# Patient Record
Sex: Female | Born: 1950
Health system: Southern US, Community
[De-identification: ages and names within clinical notes are randomized; demographics above are authoritative.]

## PROBLEM LIST (undated history)

## (undated) DIAGNOSIS — R42 Dizziness and giddiness: Secondary | ICD-10-CM

## (undated) DIAGNOSIS — G629 Polyneuropathy, unspecified: Secondary | ICD-10-CM

## (undated) DIAGNOSIS — C50919 Malignant neoplasm of unspecified site of unspecified female breast: Secondary | ICD-10-CM

## (undated) DIAGNOSIS — Z9889 Other specified postprocedural states: Secondary | ICD-10-CM

## (undated) DIAGNOSIS — R112 Nausea with vomiting, unspecified: Secondary | ICD-10-CM

## (undated) DIAGNOSIS — D472 Monoclonal gammopathy: Secondary | ICD-10-CM

## (undated) DIAGNOSIS — G4730 Sleep apnea, unspecified: Secondary | ICD-10-CM

## (undated) DIAGNOSIS — C9000 Multiple myeloma not having achieved remission: Secondary | ICD-10-CM

## (undated) DIAGNOSIS — R609 Edema, unspecified: Secondary | ICD-10-CM

## (undated) DIAGNOSIS — J45909 Unspecified asthma, uncomplicated: Secondary | ICD-10-CM

## (undated) DIAGNOSIS — M545 Low back pain, unspecified: Secondary | ICD-10-CM

## (undated) DIAGNOSIS — E7800 Pure hypercholesterolemia, unspecified: Secondary | ICD-10-CM

## (undated) DIAGNOSIS — F32A Depression, unspecified: Secondary | ICD-10-CM

## (undated) DIAGNOSIS — G63 Polyneuropathy in diseases classified elsewhere: Secondary | ICD-10-CM

## (undated) DIAGNOSIS — G4700 Insomnia, unspecified: Secondary | ICD-10-CM

## (undated) DIAGNOSIS — T7840XA Allergy, unspecified, initial encounter: Secondary | ICD-10-CM

## (undated) DIAGNOSIS — E119 Type 2 diabetes mellitus without complications: Secondary | ICD-10-CM

## (undated) DIAGNOSIS — F419 Anxiety disorder, unspecified: Secondary | ICD-10-CM

## (undated) DIAGNOSIS — G43909 Migraine, unspecified, not intractable, without status migrainosus: Secondary | ICD-10-CM

## (undated) DIAGNOSIS — I10 Essential (primary) hypertension: Secondary | ICD-10-CM

## (undated) DIAGNOSIS — I700 Atherosclerosis of aorta: Secondary | ICD-10-CM

## (undated) DIAGNOSIS — G8929 Other chronic pain: Secondary | ICD-10-CM

## (undated) HISTORY — DX: Essential (primary) hypertension: I10

## (undated) HISTORY — DX: Edema, unspecified: R60.9

## (undated) HISTORY — DX: Insomnia, unspecified: G47.00

## (undated) HISTORY — DX: Allergy, unspecified, initial encounter: T78.40XA

## (undated) HISTORY — DX: Atherosclerosis of aorta: I70.0

## (undated) HISTORY — PX: NASAL SINUS SURGERY: SHX719

## (undated) HISTORY — DX: Polyneuropathy in diseases classified elsewhere: G63

## (undated) HISTORY — DX: Pure hypercholesterolemia, unspecified: E78.00

## (undated) HISTORY — DX: Monoclonal gammopathy: D47.2

## (undated) HISTORY — DX: Polyneuropathy, unspecified: G62.9

## (undated) HISTORY — DX: Other chronic pain: G89.29

## (undated) HISTORY — DX: Migraine, unspecified, not intractable, without status migrainosus: G43.909

## (undated) HISTORY — PX: OTHER SURGICAL HISTORY: SHX169

## (undated) HISTORY — DX: Low back pain, unspecified: M54.50

## (undated) HISTORY — PX: TONSILLECTOMY: SUR1361

## (undated) HISTORY — DX: Multiple myeloma not having achieved remission: C90.00

## (undated) HISTORY — PX: NECK SURGERY: SHX720

## (undated) HISTORY — DX: Type 2 diabetes mellitus without complications: E11.9

## (undated) HISTORY — PX: BACK SURGERY: SHX140

---

## 1999-11-26 ENCOUNTER — Other Ambulatory Visit: Admission: RE | Admit: 1999-11-26 | Discharge: 1999-11-26 | Payer: Self-pay | Admitting: Obstetrics and Gynecology

## 2001-04-18 ENCOUNTER — Ambulatory Visit (HOSPITAL_COMMUNITY): Admission: RE | Admit: 2001-04-18 | Discharge: 2001-04-18 | Payer: Self-pay | Admitting: Family Medicine

## 2001-04-18 ENCOUNTER — Encounter: Payer: Self-pay | Admitting: Family Medicine

## 2002-02-18 ENCOUNTER — Ambulatory Visit (HOSPITAL_COMMUNITY): Admission: RE | Admit: 2002-02-18 | Discharge: 2002-02-18 | Payer: Self-pay | Admitting: Family Medicine

## 2002-02-18 ENCOUNTER — Encounter: Payer: Self-pay | Admitting: Family Medicine

## 2002-08-01 ENCOUNTER — Encounter (INDEPENDENT_AMBULATORY_CARE_PROVIDER_SITE_OTHER): Payer: Self-pay | Admitting: Specialist

## 2002-08-01 ENCOUNTER — Ambulatory Visit (HOSPITAL_COMMUNITY): Admission: RE | Admit: 2002-08-01 | Discharge: 2002-08-01 | Payer: Self-pay | Admitting: Gastroenterology

## 2003-05-30 ENCOUNTER — Encounter: Payer: Self-pay | Admitting: *Deleted

## 2003-05-30 ENCOUNTER — Ambulatory Visit (HOSPITAL_COMMUNITY): Admission: RE | Admit: 2003-05-30 | Discharge: 2003-05-30 | Payer: Self-pay | Admitting: *Deleted

## 2003-09-25 ENCOUNTER — Inpatient Hospital Stay (HOSPITAL_COMMUNITY): Admission: AD | Admit: 2003-09-25 | Discharge: 2003-09-27 | Payer: Self-pay | Admitting: Orthopaedic Surgery

## 2004-03-11 ENCOUNTER — Inpatient Hospital Stay (HOSPITAL_COMMUNITY): Admission: RE | Admit: 2004-03-11 | Discharge: 2004-03-15 | Payer: Self-pay | Admitting: Orthopaedic Surgery

## 2004-05-04 ENCOUNTER — Other Ambulatory Visit: Admission: RE | Admit: 2004-05-04 | Discharge: 2004-05-04 | Payer: Self-pay | Admitting: Obstetrics and Gynecology

## 2004-12-24 ENCOUNTER — Ambulatory Visit: Admission: RE | Admit: 2004-12-24 | Discharge: 2004-12-24 | Payer: Self-pay | Admitting: Family Medicine

## 2004-12-29 ENCOUNTER — Ambulatory Visit: Payer: Self-pay | Admitting: Hematology & Oncology

## 2005-01-21 ENCOUNTER — Ambulatory Visit (HOSPITAL_COMMUNITY): Admission: RE | Admit: 2005-01-21 | Discharge: 2005-01-21 | Payer: Self-pay | Admitting: Hematology & Oncology

## 2005-03-11 ENCOUNTER — Ambulatory Visit: Payer: Self-pay | Admitting: Hematology & Oncology

## 2005-05-10 ENCOUNTER — Ambulatory Visit: Payer: Self-pay | Admitting: Hematology & Oncology

## 2005-05-17 ENCOUNTER — Other Ambulatory Visit: Admission: RE | Admit: 2005-05-17 | Discharge: 2005-05-17 | Payer: Self-pay | Admitting: Obstetrics and Gynecology

## 2005-07-06 ENCOUNTER — Ambulatory Visit: Payer: Self-pay | Admitting: Internal Medicine

## 2005-07-21 ENCOUNTER — Ambulatory Visit: Payer: Self-pay | Admitting: Cardiology

## 2005-07-25 ENCOUNTER — Ambulatory Visit: Payer: Self-pay | Admitting: Internal Medicine

## 2005-08-18 ENCOUNTER — Ambulatory Visit: Payer: Self-pay | Admitting: Internal Medicine

## 2005-09-08 ENCOUNTER — Encounter: Admission: RE | Admit: 2005-09-08 | Discharge: 2005-09-08 | Payer: Self-pay | Admitting: Obstetrics and Gynecology

## 2005-11-09 ENCOUNTER — Ambulatory Visit: Payer: Self-pay | Admitting: Hematology & Oncology

## 2005-11-11 ENCOUNTER — Ambulatory Visit (HOSPITAL_COMMUNITY): Admission: RE | Admit: 2005-11-11 | Discharge: 2005-11-11 | Payer: Self-pay

## 2006-03-18 ENCOUNTER — Encounter: Admission: RE | Admit: 2006-03-18 | Discharge: 2006-03-18 | Payer: Self-pay | Admitting: Orthopaedic Surgery

## 2006-05-05 ENCOUNTER — Ambulatory Visit: Payer: Self-pay | Admitting: Hematology & Oncology

## 2006-05-07 ENCOUNTER — Encounter: Admission: RE | Admit: 2006-05-07 | Discharge: 2006-05-07 | Payer: Self-pay | Admitting: Orthopaedic Surgery

## 2006-05-08 LAB — CBC WITH DIFFERENTIAL/PLATELET
Basophils Absolute: 0.1 10*3/uL (ref 0.0–0.1)
HCT: 39.5 % (ref 34.8–46.6)
HGB: 13.6 g/dL (ref 11.6–15.9)
MCH: 30.2 pg (ref 26.0–34.0)
MCHC: 34.4 g/dL (ref 32.0–36.0)
MCV: 87.8 fL (ref 81.0–101.0)
NEUT%: 61.5 % (ref 39.6–76.8)
lymph#: 3.5 10*3/uL — ABNORMAL HIGH (ref 0.9–3.3)

## 2006-05-08 LAB — CHCC SMEAR

## 2006-05-10 ENCOUNTER — Ambulatory Visit (HOSPITAL_COMMUNITY): Admission: RE | Admit: 2006-05-10 | Discharge: 2006-05-11 | Payer: Self-pay | Admitting: Orthopaedic Surgery

## 2006-05-17 ENCOUNTER — Ambulatory Visit (HOSPITAL_COMMUNITY): Admission: RE | Admit: 2006-05-17 | Discharge: 2006-05-17 | Payer: Self-pay | Admitting: Orthopaedic Surgery

## 2006-06-13 ENCOUNTER — Other Ambulatory Visit: Admission: RE | Admit: 2006-06-13 | Discharge: 2006-06-13 | Payer: Self-pay | Admitting: Obstetrics and Gynecology

## 2006-08-24 ENCOUNTER — Inpatient Hospital Stay (HOSPITAL_COMMUNITY): Admission: RE | Admit: 2006-08-24 | Discharge: 2006-08-30 | Payer: Self-pay | Admitting: Orthopaedic Surgery

## 2006-11-02 ENCOUNTER — Ambulatory Visit: Payer: Self-pay | Admitting: Hematology & Oncology

## 2007-02-02 ENCOUNTER — Ambulatory Visit: Payer: Self-pay | Admitting: Hematology & Oncology

## 2007-02-07 LAB — CBC WITH DIFFERENTIAL/PLATELET
Basophils Absolute: 0 10*3/uL (ref 0.0–0.1)
HCT: 35.3 % (ref 34.8–46.6)
HGB: 12.2 g/dL (ref 11.6–15.9)
LYMPH%: 38.1 % (ref 14.0–48.0)
MCHC: 34.6 g/dL (ref 32.0–36.0)
MONO#: 0.8 10*3/uL (ref 0.1–0.9)
NEUT%: 48.6 % (ref 39.6–76.8)
Platelets: 344 10*3/uL (ref 145–400)
WBC: 9.8 10*3/uL (ref 3.9–10.0)
lymph#: 3.7 10*3/uL — ABNORMAL HIGH (ref 0.9–3.3)

## 2007-02-09 LAB — SPEP & IFE WITH QIG
Albumin ELP: 57.1 % (ref 55.8–66.1)
Alpha-1-Globulin: 3.9 % (ref 2.9–4.9)
IgA: 139 mg/dL (ref 68–378)
IgM, Serum: 99 mg/dL (ref 60–263)
Total Protein, Serum Electrophoresis: 7.8 g/dL (ref 6.0–8.3)

## 2007-02-09 LAB — KAPPA/LAMBDA LIGHT CHAINS
Kappa free light chain: 1.26 mg/dL (ref 0.33–1.94)
Kappa:Lambda Ratio: 0.71 (ref 0.26–1.65)
Lambda Free Lght Chn: 1.77 mg/dL (ref 0.57–2.63)

## 2007-02-09 LAB — BETA 2 MICROGLOBULIN, SERUM: Beta-2 Microglobulin: 2.19 mg/L — ABNORMAL HIGH (ref 1.01–1.73)

## 2007-02-27 ENCOUNTER — Encounter: Payer: Self-pay | Admitting: Hematology & Oncology

## 2007-02-27 ENCOUNTER — Ambulatory Visit (HOSPITAL_COMMUNITY): Admission: RE | Admit: 2007-02-27 | Discharge: 2007-02-27 | Payer: Self-pay | Admitting: Hematology & Oncology

## 2007-02-28 ENCOUNTER — Ambulatory Visit: Payer: Self-pay | Admitting: Hematology & Oncology

## 2007-06-18 ENCOUNTER — Other Ambulatory Visit: Admission: RE | Admit: 2007-06-18 | Discharge: 2007-06-18 | Payer: Self-pay | Admitting: Obstetrics and Gynecology

## 2007-07-03 ENCOUNTER — Encounter: Admission: RE | Admit: 2007-07-03 | Discharge: 2007-07-03 | Payer: Self-pay | Admitting: Internal Medicine

## 2007-08-05 ENCOUNTER — Ambulatory Visit: Payer: Self-pay | Admitting: Hematology & Oncology

## 2007-08-08 LAB — CBC WITH DIFFERENTIAL/PLATELET
BASO%: 0.5 % (ref 0.0–2.0)
EOS%: 2.2 % (ref 0.0–7.0)
HCT: 37.4 % (ref 34.8–46.6)
MCH: 29.4 pg (ref 26.0–34.0)
MCHC: 34.8 g/dL (ref 32.0–36.0)
MONO#: 0.8 10*3/uL (ref 0.1–0.9)
NEUT%: 60.7 % (ref 39.6–76.8)
RBC: 4.43 10*6/uL (ref 3.70–5.32)
RDW: 13.5 % (ref 11.3–14.5)
WBC: 12.6 10*3/uL — ABNORMAL HIGH (ref 3.9–10.0)
lymph#: 3.8 10*3/uL — ABNORMAL HIGH (ref 0.9–3.3)

## 2007-08-08 LAB — CHCC SMEAR

## 2007-08-14 LAB — PROTEIN ELECTROPHORESIS, SERUM
Alpha-1-Globulin: 4 % (ref 2.9–4.9)
Beta 2: 5 % (ref 3.2–6.5)
Gamma Globulin: 16.5 % (ref 11.1–18.8)
M-Spike, %: 0.44 g/dL

## 2007-08-14 LAB — IGG, IGA, IGM
IgA: 142 mg/dL (ref 68–378)
IgM, Serum: 105 mg/dL (ref 60–263)

## 2007-11-13 ENCOUNTER — Encounter: Admission: RE | Admit: 2007-11-13 | Discharge: 2007-11-13 | Payer: Self-pay | Admitting: Orthopaedic Surgery

## 2008-01-21 ENCOUNTER — Ambulatory Visit: Payer: Self-pay | Admitting: Hematology & Oncology

## 2008-02-29 ENCOUNTER — Encounter: Payer: Self-pay | Admitting: Hematology & Oncology

## 2008-02-29 ENCOUNTER — Ambulatory Visit: Payer: Self-pay | Admitting: Vascular Surgery

## 2008-02-29 ENCOUNTER — Ambulatory Visit (HOSPITAL_COMMUNITY): Admission: RE | Admit: 2008-02-29 | Discharge: 2008-02-29 | Payer: Self-pay | Admitting: Hematology & Oncology

## 2008-02-29 LAB — CBC WITH DIFFERENTIAL/PLATELET
BASO%: 0.4 % (ref 0.0–2.0)
Basophils Absolute: 0 10*3/uL (ref 0.0–0.1)
EOS%: 2.3 % (ref 0.0–7.0)
MCH: 29.7 pg (ref 26.0–34.0)
MCHC: 34.4 g/dL (ref 32.0–36.0)
MCV: 86.3 fL (ref 81.0–101.0)
MONO%: 7.1 % (ref 0.0–13.0)
NEUT%: 52.7 % (ref 39.6–76.8)
RDW: 13.6 % (ref 11.3–14.5)
lymph#: 3.7 10*3/uL — ABNORMAL HIGH (ref 0.9–3.3)

## 2008-03-04 LAB — JAK2 EXONS 12 & 13 MUTATION, REFLEX: JAK2 Exons 12 & 13: 13

## 2008-03-04 LAB — JAK2 GENOTYPR

## 2008-06-25 ENCOUNTER — Ambulatory Visit: Payer: Self-pay | Admitting: Hematology & Oncology

## 2008-06-26 LAB — CBC WITH DIFFERENTIAL (CANCER CENTER ONLY)
BASO#: 0.1 10*3/uL (ref 0.0–0.2)
Eosinophils Absolute: 0.3 10*3/uL (ref 0.0–0.5)
HCT: 37.7 % (ref 34.8–46.6)
HGB: 12.9 g/dL (ref 11.6–15.9)
LYMPH%: 37.5 % (ref 14.0–48.0)
MCH: 29 pg (ref 26.0–34.0)
MCV: 85 fL (ref 81–101)
MONO%: 4.6 % (ref 0.0–13.0)
NEUT%: 54 % (ref 39.6–80.0)
RBC: 4.43 10*6/uL (ref 3.70–5.32)

## 2008-06-30 LAB — COMPREHENSIVE METABOLIC PANEL
AST: 29 U/L (ref 0–37)
Alkaline Phosphatase: 63 U/L (ref 39–117)
BUN: 17 mg/dL (ref 6–23)
Creatinine, Ser: 0.85 mg/dL (ref 0.40–1.20)
Total Bilirubin: 0.6 mg/dL (ref 0.3–1.2)

## 2008-06-30 LAB — PROTEIN ELECTROPHORESIS, SERUM
Alpha-1-Globulin: 4.1 % (ref 2.9–4.9)
Alpha-2-Globulin: 12.9 % — ABNORMAL HIGH (ref 7.1–11.8)
Beta 2: 4.9 % (ref 3.2–6.5)
Gamma Globulin: 15.2 % (ref 11.1–18.8)

## 2008-06-30 LAB — IGG, IGA, IGM
IgG (Immunoglobin G), Serum: 1180 mg/dL (ref 694–1618)
IgM, Serum: 109 mg/dL (ref 60–263)

## 2008-06-30 LAB — KAPPA/LAMBDA LIGHT CHAINS: Kappa:Lambda Ratio: 0.55 (ref 0.26–1.65)

## 2008-07-23 ENCOUNTER — Other Ambulatory Visit: Admission: RE | Admit: 2008-07-23 | Discharge: 2008-07-23 | Payer: Self-pay | Admitting: Obstetrics and Gynecology

## 2008-08-11 ENCOUNTER — Encounter: Admission: RE | Admit: 2008-08-11 | Discharge: 2008-08-11 | Payer: Self-pay | Admitting: Obstetrics and Gynecology

## 2008-12-24 ENCOUNTER — Ambulatory Visit: Payer: Self-pay | Admitting: Hematology & Oncology

## 2008-12-25 LAB — CBC WITH DIFFERENTIAL (CANCER CENTER ONLY)
BASO#: 0.1 10*3/uL (ref 0.0–0.2)
Eosinophils Absolute: 0.4 10*3/uL (ref 0.0–0.5)
HGB: 12.2 g/dL (ref 11.6–15.9)
MCH: 28.1 pg (ref 26.0–34.0)
MONO%: 4.9 % (ref 0.0–13.0)
NEUT#: 4.8 10*3/uL (ref 1.5–6.5)
RBC: 4.33 10*6/uL (ref 3.70–5.32)

## 2008-12-27 LAB — TRANSFERRIN RECEPTOR, SOLUABLE: Transferrin Receptor, Soluble: 21.3 nmol/L

## 2008-12-27 LAB — VITAMIN D 25 HYDROXY (VIT D DEFICIENCY, FRACTURES): Vit D, 25-Hydroxy: 54 ng/mL (ref 30–89)

## 2008-12-27 LAB — FERRITIN: Ferritin: 33 ng/mL (ref 10–291)

## 2009-01-30 ENCOUNTER — Ambulatory Visit (HOSPITAL_COMMUNITY): Admission: RE | Admit: 2009-01-30 | Discharge: 2009-01-30 | Payer: Self-pay | Admitting: Orthopaedic Surgery

## 2009-07-09 ENCOUNTER — Ambulatory Visit: Payer: Self-pay | Admitting: Hematology & Oncology

## 2009-07-22 LAB — CBC WITH DIFFERENTIAL (CANCER CENTER ONLY)
BASO#: 0.1 10*3/uL (ref 0.0–0.2)
EOS%: 3.3 % (ref 0.0–7.0)
Eosinophils Absolute: 0.4 10*3/uL (ref 0.0–0.5)
HGB: 13 g/dL (ref 11.6–15.9)
LYMPH#: 4.4 10*3/uL — ABNORMAL HIGH (ref 0.9–3.3)
MCHC: 34.5 g/dL (ref 32.0–36.0)
NEUT#: 5.3 10*3/uL (ref 1.5–6.5)
RBC: 4.5 10*6/uL (ref 3.70–5.32)
WBC: 10.8 10*3/uL — ABNORMAL HIGH (ref 3.9–10.0)

## 2009-07-24 LAB — SPEP & IFE WITH QIG
Albumin ELP: 55.5 % — ABNORMAL LOW (ref 55.8–66.1)
Alpha-2-Globulin: 12.4 % — ABNORMAL HIGH (ref 7.1–11.8)
Beta 2: 4.9 % (ref 3.2–6.5)
Beta Globulin: 7.5 % — ABNORMAL HIGH (ref 4.7–7.2)
Total Protein, Serum Electrophoresis: 7.1 g/dL (ref 6.0–8.3)

## 2009-07-24 LAB — COMPREHENSIVE METABOLIC PANEL
BUN: 13 mg/dL (ref 6–23)
CO2: 28 mEq/L (ref 19–32)
Calcium: 9.8 mg/dL (ref 8.4–10.5)
Chloride: 100 mEq/L (ref 96–112)
Creatinine, Ser: 0.84 mg/dL (ref 0.40–1.20)

## 2009-08-24 ENCOUNTER — Encounter: Admission: RE | Admit: 2009-08-24 | Discharge: 2009-08-24 | Payer: Self-pay | Admitting: Obstetrics and Gynecology

## 2009-11-12 ENCOUNTER — Encounter: Payer: Self-pay | Admitting: Pulmonary Disease

## 2009-11-12 DIAGNOSIS — Z9289 Personal history of other medical treatment: Secondary | ICD-10-CM

## 2009-11-12 HISTORY — DX: Personal history of other medical treatment: Z92.89

## 2009-12-11 DIAGNOSIS — E1142 Type 2 diabetes mellitus with diabetic polyneuropathy: Secondary | ICD-10-CM | POA: Insufficient documentation

## 2009-12-11 DIAGNOSIS — R609 Edema, unspecified: Secondary | ICD-10-CM | POA: Insufficient documentation

## 2009-12-11 DIAGNOSIS — G609 Hereditary and idiopathic neuropathy, unspecified: Secondary | ICD-10-CM | POA: Insufficient documentation

## 2009-12-11 DIAGNOSIS — G43909 Migraine, unspecified, not intractable, without status migrainosus: Secondary | ICD-10-CM | POA: Insufficient documentation

## 2009-12-11 DIAGNOSIS — G4700 Insomnia, unspecified: Secondary | ICD-10-CM | POA: Insufficient documentation

## 2009-12-11 DIAGNOSIS — E785 Hyperlipidemia, unspecified: Secondary | ICD-10-CM | POA: Insufficient documentation

## 2009-12-11 DIAGNOSIS — E119 Type 2 diabetes mellitus without complications: Secondary | ICD-10-CM | POA: Insufficient documentation

## 2009-12-11 DIAGNOSIS — I10 Essential (primary) hypertension: Secondary | ICD-10-CM | POA: Insufficient documentation

## 2009-12-14 ENCOUNTER — Ambulatory Visit: Payer: Self-pay | Admitting: Pulmonary Disease

## 2009-12-14 DIAGNOSIS — R0600 Dyspnea, unspecified: Secondary | ICD-10-CM | POA: Insufficient documentation

## 2010-01-11 ENCOUNTER — Ambulatory Visit: Payer: Self-pay | Admitting: Pulmonary Disease

## 2010-01-11 DIAGNOSIS — J45909 Unspecified asthma, uncomplicated: Secondary | ICD-10-CM | POA: Insufficient documentation

## 2010-01-18 ENCOUNTER — Ambulatory Visit: Payer: Self-pay | Admitting: Hematology & Oncology

## 2010-01-20 LAB — CBC WITH DIFFERENTIAL (CANCER CENTER ONLY)
EOS%: 3.4 % (ref 0.0–7.0)
Eosinophils Absolute: 0.4 10*3/uL (ref 0.0–0.5)
LYMPH%: 46.8 % (ref 14.0–48.0)
MCH: 28.8 pg (ref 26.0–34.0)
MCHC: 33.1 g/dL (ref 32.0–36.0)
MCV: 87 fL (ref 81–101)
MONO%: 5.4 % (ref 0.0–13.0)
Platelets: 471 10*3/uL — ABNORMAL HIGH (ref 145–400)
RBC: 4.93 10*6/uL (ref 3.70–5.32)

## 2010-01-22 LAB — COMPREHENSIVE METABOLIC PANEL
Alkaline Phosphatase: 65 U/L (ref 39–117)
BUN: 13 mg/dL (ref 6–23)
Glucose, Bld: 146 mg/dL — ABNORMAL HIGH (ref 70–99)
Sodium: 137 mEq/L (ref 135–145)
Total Bilirubin: 0.8 mg/dL (ref 0.3–1.2)
Total Protein: 7.6 g/dL (ref 6.0–8.3)

## 2010-01-22 LAB — SPEP & IFE WITH QIG
Beta 2: 4.8 % (ref 3.2–6.5)
Beta Globulin: 7 % (ref 4.7–7.2)
IgA: 118 mg/dL (ref 68–378)
IgG (Immunoglobin G), Serum: 1440 mg/dL (ref 694–1618)
M-Spike, %: 0.52 g/dL

## 2010-01-22 LAB — KAPPA/LAMBDA LIGHT CHAINS
Kappa:Lambda Ratio: 0.73 (ref 0.26–1.65)
Lambda Free Lght Chn: 1.09 mg/dL (ref 0.57–2.63)

## 2010-03-11 ENCOUNTER — Ambulatory Visit: Payer: Self-pay | Admitting: Hematology & Oncology

## 2010-03-12 LAB — CBC WITH DIFFERENTIAL (CANCER CENTER ONLY)
BASO%: 0.9 % (ref 0.0–2.0)
Eosinophils Absolute: 0.5 10*3/uL (ref 0.0–0.5)
LYMPH#: 4.9 10*3/uL — ABNORMAL HIGH (ref 0.9–3.3)
MONO#: 0.6 10*3/uL (ref 0.1–0.9)
Platelets: 334 10*3/uL (ref 145–400)
RBC: 4.61 10*6/uL (ref 3.70–5.32)
RDW: 11.6 % (ref 10.5–14.6)
WBC: 9.6 10*3/uL (ref 3.9–10.0)

## 2010-03-12 LAB — CHCC SATELLITE - SMEAR

## 2010-03-22 LAB — SPEP & IFE WITH QIG
Beta 2: 3.7 % (ref 3.2–6.5)
Gamma Globulin: 16.6 % (ref 11.1–18.8)
IgA: 110 mg/dL (ref 68–378)
IgG (Immunoglobin G), Serum: 1220 mg/dL (ref 694–1618)
IgM, Serum: 95 mg/dL (ref 60–263)
M-Spike, %: 0.49 g/dL

## 2010-04-29 ENCOUNTER — Ambulatory Visit: Payer: Self-pay | Admitting: Hematology & Oncology

## 2010-05-28 ENCOUNTER — Ambulatory Visit: Payer: Self-pay | Admitting: Hematology & Oncology

## 2010-10-12 ENCOUNTER — Other Ambulatory Visit: Payer: Self-pay | Admitting: Dermatology

## 2010-10-12 NOTE — Assessment & Plan Note (Signed)
Summary: consult for dyspnea   Copy to:  Dr. Merri Brunette Primary Provider/Referring Provider:  Merri Brunette  CC:  Pulmonary consult for dyspnea.  The patient c/o worsening sob with exertion and at rest for 6 months..  History of Present Illness: The pt is a 60y/o female who I have been asked to see for dsypnea.  The pt has had sob for about one year, and feels that it is progressive.  It is intermittant in nature, and can occur with exertion, resting while watching tv, and while lying in bed.  She has the feeling she is not gettin enough air, and that she is going to "faint".  She denies the sensation of a band or tightness across her chest.  What is puzzling is that she can do exertional activities on some days with no sob, and then can become very symptomatic on another day.  She has noted no pattern to this.  She denies any cough or mucus, but does have LE edema on occasion.  She tells me that her weight is down 20 pounds over the last one year.  She also notes that her walking is more limited by her neuropathy and back pain than true sob.  She has had a recent stress test which showed no evidence for ischemia.  She had a recent echo which showed nl EF, ASH, evidence for mild LAE with diastolic dysfunction, mild MV calcification, and mild AI.  She has no history of asthma, and has negative allergy testing with normal IgE and eos in the past.  She has never had pfts or recent cxr (cxr 01/2009 normal).  Preventive Screening-Counseling & Management  Alcohol-Tobacco     Alcohol drinks/day: 0     Smoking Status: never  Current Medications (verified): 1)  Flonase 50 Mcg/act Susp (Fluticasone Propionate) .Marland Kitchen.. 1 Puff Each Nostril Once Daily 2)  Baclofen 10 Mg Tabs (Baclofen) .... Three Times A Day As Needed 3)  Morphine Sulfate Cr 30 Mg Xr12h-Tab (Morphine Sulfate) .... Three Times A Day 4)  Welchol 625 Mg Tabs (Colesevelam Hcl) .... 3 Tablets Two Times A Day 5)  Venlafaxine Hcl 100 Mg Tabs  (Venlafaxine Hcl) .Marland Kitchen.. 1 By Mouth Daily 6)  Oxycodone-Acetaminophen 5-325 Mg Tabs (Oxycodone-Acetaminophen) .Marland Kitchen.. 1 Tab As Needed Q8h 7)  Benicar Hct 20-12.5 Mg Tabs (Olmesartan Medoxomil-Hctz) .... Once Daily 8)  Lisinopril 5 Mg Tabs (Lisinopril) .... Once Daily 9)  Toprol Xl 100 Mg Xr24h-Tab (Metoprolol Succinate) .... Once Daily 10)  Metformin Hcl 500 Mg Xr24h-Tab (Metformin Hcl) .... 2 Tablets Two Times A Day 11)  Zocor 40 Mg Tabs (Simvastatin) .... Once Daily  Allergies (verified): No Known Drug Allergies  Past History:  Past Medical History: Reviewed history from 12/11/2009 and no changes required. PERIPHERAL NEUROPATHY (ICD-356.9) HYPERLIPIDEMIA (ICD-272.4) INSOMNIA (ICD-780.52) EDEMA (ICD-782.3) MIGRAINE HEADACHE (ICD-346.90) HYPERTENSION (ICD-401.9) DIABETES MELLITUS, TYPE II (ICD-250.00)  Past Surgical History: Neck surgery 09/2003 Back surgery x3 in 02/2004, 09/2005, 08/2006 Tubal Ligation in 1990 left foot surgery in 2010 tight knee surg in 2011 left knee surg 2010 C-section x2 Inguinal hernia repair as an imfant  Family History: Reviewed history and no changes required. Leukemia---father Heart disease---mother Seasonal allergies---sister Family History Emphysema ---mother  Social History: Reviewed history and no changes required. Married Disabled Lives with her husband and childrenAlcohol drinks/day:  0 Smoking Status:  never  Review of Systems       The patient complains of shortness of breath with activity, shortness of breath at rest, irregular heartbeats,  loss of appetite, weight change, headaches, nasal congestion/difficulty breathing through nose, depression, and joint stiffness or pain.  The patient denies productive cough, non-productive cough, coughing up blood, chest pain, acid heartburn, indigestion, abdominal pain, difficulty swallowing, sore throat, tooth/dental problems, sneezing, itching, ear ache, anxiety, hand/feet swelling, rash, change in  color of mucus, and fever.    Vital Signs:  Patient profile:   60 year old female Height:      66 inches (167.64 cm) Weight:      215.25 pounds (97.84 kg) BMI:     34.87 O2 Sat:      95 % on Room air Temp:     97.9 degrees F (36.61 degrees C) oral Pulse rate:   77 / minute BP sitting:   124 / 82  (left arm) Cuff size:   large  Vitals Entered By: Michel Bickers CMA (December 14, 2009 3:33 PM)  O2 Sat at Rest %:  95 O2 Flow:  Room air CC: Pulmonary consult for dyspnea.  The patient c/o worsening sob with exertion and at rest for 6 months. Is Patient Diabetic? Yes   Physical Exam  General:  ow female in nad Eyes:  PERRLA and EOMI.   Nose:  patent without discharge Mouth:  clear  Neck:  no jvd, tmg, LN Lungs:  totally clear to auscultation Heart:  rrr, no mrg Abdomen:  soft and nontender, bs+ Extremities:  no edema, pulses intact distally no cyanosis Neurologic:  alert and oriented, moves all 4.   Impression & Recommendations:  Problem # 1:  DYSPNEA (ICD-786.05) I suspect the pt's dyspnea is multifactorial.  She is obese, deconditioned, has back issues and peripheral neuropathy, and has evidence for diastolic dysfunction on her recent echo.  What I don't know is whether she may have an ongoing pulmonary issue that is contributing to this.  Her history is not suggestive of anything, but will check cxr and full pft's to evaluate.  Medications Added to Medication List This Visit: 1)  Venlafaxine Hcl 100 Mg Tabs (Venlafaxine hcl) .Marland Kitchen.. 1 by mouth daily  Other Orders: Consultation Level IV (11914) Pulmonary Referral (Pulmonary) T-2 View CXR (71020TC)  Patient Instructions: 1)  will check cxr today, and will call you with results. 2)  will schedule for breathing studies, and see you back on same day.

## 2010-10-12 NOTE — Assessment & Plan Note (Signed)
Summary: rov for asthma, dyspnea   Copy to:  Dr. Merri Brunette Primary Provider/Referring Provider:  Merri Brunette  CC:  Pt is here for a f/u appt to discuss PFT results.  .  History of Present Illness: the pt comes in today for f/u after her pfts as part of a w/u for doe.  She was found to have mild obstruction, no restriction, and mild decrease in dlco.  I have reviewed the study with her, and answered all of her questions.  Medications Prior to Update: 1)  Flonase 50 Mcg/act Susp (Fluticasone Propionate) .Marland Kitchen.. 1 Puff Each Nostril Once Daily 2)  Baclofen 10 Mg Tabs (Baclofen) .... Three Times A Day As Needed 3)  Morphine Sulfate Cr 30 Mg Xr12h-Tab (Morphine Sulfate) .... Three Times A Day 4)  Welchol 625 Mg Tabs (Colesevelam Hcl) .... 3 Tablets Two Times A Day 5)  Venlafaxine Hcl 100 Mg Tabs (Venlafaxine Hcl) .Marland Kitchen.. 1 By Mouth Daily 6)  Oxycodone-Acetaminophen 5-325 Mg Tabs (Oxycodone-Acetaminophen) .Marland Kitchen.. 1 Tab As Needed Q8h 7)  Benicar Hct 20-12.5 Mg Tabs (Olmesartan Medoxomil-Hctz) .... Once Daily 8)  Lisinopril 5 Mg Tabs (Lisinopril) .... Once Daily 9)  Toprol Xl 100 Mg Xr24h-Tab (Metoprolol Succinate) .... Once Daily 10)  Metformin Hcl 500 Mg Xr24h-Tab (Metformin Hcl) .... 2 Tablets Two Times A Day 11)  Zocor 40 Mg Tabs (Simvastatin) .... Once Daily  Allergies (verified): No Known Drug Allergies  Vital Signs:  Patient profile:   60 year old female Height:      66 inches Weight:      212 pounds BMI:     34.34 O2 Sat:      94 % on Room air Temp:     99.1 degrees F oral Pulse rate:   81 / minute BP sitting:   120 / 80  (right arm) Cuff size:   large  Vitals Entered By: Arman Filter LPN (Jan 12, 8656 1:33 PM)  O2 Flow:  Room air CC: Pt is here for a f/u appt to discuss PFT results.   Comments Medications reviewed with patient Arman Filter LPN  Jan 12, 8468 1:36 PM    Physical Exam  General:  obese female in nad   Impression & Recommendations:  Problem # 1:   INTRINSIC ASTHMA, UNSPECIFIED (ICD-493.10) the pt has mild airflow obstruction on her pfts today, along with a clinical history of day to day variability in her doe.  This is most c/w asthma.  However, her degree of sob seems out of proportion to her degree of obstruction, and therefore I think she has multiple things going on which contribute to her sob.  She is obese, deconditioned, has MSK issues, and has known diastolic dysfunction.  Will start her on a good bronchodilator regimen with ICS as well, and see how much improvement she gets.  If not much, then I suspect a lot of this is related to her other factors.  I have asked her to work aggressively on weight loss.  Other Orders: Est. Patient Level III (62952)  Patient Instructions: 1)  will try on symbicort 160/4.5  2 inhalations am and pm for next 4 weeks.  Rinse mouth well. 2)  please call me in 4 weeks with your response to inhaler 3)  continue to work on weight loss, conditioninig program, and keep eye on fluid in ankles.   Immunization History:  Influenza Immunization History:    Influenza:  historical (06/12/2009)  Pneumovax Immunization History:  Pneumovax:  historical (09/12/2005)

## 2010-11-15 ENCOUNTER — Encounter (HOSPITAL_BASED_OUTPATIENT_CLINIC_OR_DEPARTMENT_OTHER): Payer: Medicare Other | Admitting: Hematology & Oncology

## 2010-11-15 ENCOUNTER — Other Ambulatory Visit: Payer: Self-pay | Admitting: Hematology & Oncology

## 2010-11-15 DIAGNOSIS — D472 Monoclonal gammopathy: Secondary | ICD-10-CM

## 2010-11-15 DIAGNOSIS — G609 Hereditary and idiopathic neuropathy, unspecified: Secondary | ICD-10-CM

## 2010-11-15 DIAGNOSIS — R110 Nausea: Secondary | ICD-10-CM

## 2010-11-15 DIAGNOSIS — R112 Nausea with vomiting, unspecified: Secondary | ICD-10-CM

## 2010-11-15 DIAGNOSIS — C9000 Multiple myeloma not having achieved remission: Secondary | ICD-10-CM

## 2010-11-15 LAB — CBC WITH DIFFERENTIAL (CANCER CENTER ONLY)
BASO#: 0.1 10*3/uL (ref 0.0–0.2)
Eosinophils Absolute: 0.5 10*3/uL (ref 0.0–0.5)
LYMPH%: 55.1 % — ABNORMAL HIGH (ref 14.0–48.0)
MCH: 28.7 pg (ref 26.0–34.0)
MCV: 86 fL (ref 81–101)
MONO%: 7.8 % (ref 0.0–13.0)
NEUT%: 30.6 % — ABNORMAL LOW (ref 39.6–80.0)
Platelets: 278 10*3/uL (ref 145–400)
RBC: 4.46 10*6/uL (ref 3.70–5.32)

## 2010-11-15 LAB — TECHNOLOGIST REVIEW CHCC SATELLITE

## 2010-11-17 LAB — SPEP & IFE WITH QIG
Alpha-2-Globulin: 11 % (ref 7.1–11.8)
Beta Globulin: 6.9 % (ref 4.7–7.2)
IgG (Immunoglobin G), Serum: 1410 mg/dL (ref 694–1618)
M-Spike, %: 0.58 g/dL
Total Protein, Serum Electrophoresis: 7.2 g/dL (ref 6.0–8.3)

## 2010-11-17 LAB — COMPREHENSIVE METABOLIC PANEL
CO2: 26 mEq/L (ref 19–32)
Creatinine, Ser: 0.79 mg/dL (ref 0.40–1.20)
Glucose, Bld: 207 mg/dL — ABNORMAL HIGH (ref 70–99)
Total Bilirubin: 0.8 mg/dL (ref 0.3–1.2)

## 2010-11-17 LAB — KAPPA/LAMBDA LIGHT CHAINS
Kappa free light chain: 1.91 mg/dL (ref 0.33–1.94)
Kappa:Lambda Ratio: 0.69 (ref 0.26–1.65)

## 2010-11-17 LAB — FERRITIN: Ferritin: 74 ng/mL (ref 10–291)

## 2010-11-19 ENCOUNTER — Other Ambulatory Visit (HOSPITAL_BASED_OUTPATIENT_CLINIC_OR_DEPARTMENT_OTHER): Payer: Medicare Other

## 2010-11-22 ENCOUNTER — Other Ambulatory Visit: Payer: Self-pay | Admitting: Hematology & Oncology

## 2010-11-22 DIAGNOSIS — R509 Fever, unspecified: Secondary | ICD-10-CM

## 2010-11-24 ENCOUNTER — Other Ambulatory Visit (HOSPITAL_BASED_OUTPATIENT_CLINIC_OR_DEPARTMENT_OTHER): Payer: Medicare Other

## 2010-12-01 ENCOUNTER — Other Ambulatory Visit (HOSPITAL_BASED_OUTPATIENT_CLINIC_OR_DEPARTMENT_OTHER): Payer: Medicare Other

## 2010-12-21 LAB — BASIC METABOLIC PANEL
BUN: 10 mg/dL (ref 6–23)
CO2: 30 mEq/L (ref 19–32)
Chloride: 100 mEq/L (ref 96–112)
Creatinine, Ser: 0.74 mg/dL (ref 0.4–1.2)
Glucose, Bld: 114 mg/dL — ABNORMAL HIGH (ref 70–99)

## 2010-12-21 LAB — GLUCOSE, CAPILLARY: Glucose-Capillary: 118 mg/dL — ABNORMAL HIGH (ref 70–99)

## 2010-12-21 LAB — CBC
MCHC: 35 g/dL (ref 30.0–36.0)
MCV: 84.9 fL (ref 78.0–100.0)
Platelets: 387 10*3/uL (ref 150–400)
RDW: 13.7 % (ref 11.5–15.5)

## 2011-01-25 NOTE — Op Note (Signed)
NAMEANALIYAH, LECHUGA NO.:  [id number]   MEDICAL RECORD NO.:  [id number]          PATIENT TYPE:  OUT   LOCATION:  OMED                         FACILITY:  [facility]   PHYSICIAN:  Rose Phi. Myna Hidalgo, M.D. DATE OF BIRTH:  May 25, 1951   DATE OF PROCEDURE:  02/27/2007  DATE OF DISCHARGE:                               OPERATIVE REPORT   PROCEDURE:  Left posterior iliac crest bone marrow biopsy and aspirate.   Ms. Wittmeyer was brought to short stay unit.  She had an IV placed into  her right hand.  She was then placed onto her right side.  The left  posterior crest region was prepped and draped in sterile fashion.   Ms. Allmon received Versed 5 mg and Demerol 25 mg IV for sedation.   We infiltrated 2% lidocaine under the skin down to periosteum.  We did  need a 22 gauge spinal needle to reach the periosteum.   #11 scalpel was used to make incisions in the skin.   We used the extra long Jamshidi biopsy needle.  We able to enter the  bone marrow without difficulty.  I obtained a bone marrow aspirate  through the biopsy needle.  After obtaining two bone marrow aspirates, I  proceeded to get the bone marrow biopsy core.  We got excellent passed  without complications.   Ms. Cammarano tolerated the procedure well.      Rose Phi. Myna Hidalgo, M.D.  Electronically Signed     PRE/MEDQ  D:  02/27/2007  T:  02/27/2007  Job:  [id number]

## 2011-01-25 NOTE — Op Note (Signed)
NAMEJOHNNIE, [name] NO.:  [id number]   MEDICAL RECORD NO.:  [id number]          PATIENT TYPE:  AMB   LOCATION:  SDS                          FACILITY:  [facility]   PHYSICIAN:  [name]. Magnus Ivan, M.D.DATE OF BIRTH:  11-23-1950   DATE OF PROCEDURE:  01/30/2009  DATE OF DISCHARGE:  01/30/2009                               OPERATIVE REPORT   PREOPERATIVE DIAGNOSIS:  Left foot chronic nonunion fifth metatarsal  base fracture.   POSTOPERATIVE DIAGNOSIS:  Left foot chronic nonunion fifth metatarsal  base fracture.   PROCEDURES:  Takedown of nonunion, left fifth metatarsal base and  removal of devitalized bone.   SURGEON:  [name]. Magnus Ivan, MD   ANESTHESIA:  General.   TOURNIQUET TIME:  50 minutes.   ESTIMATED BLOOD LOSS:  Minimal.   COMPLICATIONS:  None.   INDICATIONS:  Briefly, Ms. Klunk is a 60 year old with severe  neuropathy and diabetes who a year ago sustained a left fifth metatarsal  base fracture.  I followed this for a year and she has gone on to a  chronic nonunion.  She has had pain centered left at the fracture site  at the base of metatarsal in spite of conservative efforts to treat her.  She wears a postoperative shoe and I did inject in that area and it did  cause her pain to decrease.  I recommended she undergo attempted  takedown of the nonunion with fixation of the base fracture versus  excision of the base depending on the quality of her bone.  The risks  and benefits of this were explained to her in length and well  understood, and she understood the need to proceed with surgery.   PROCEDURE:  After the informed consent was obtained, appropriate left  foot was marked. She was brought to the operating room, placed supine on  the operating table.  General anesthesia was then obtained.  A  nonsterile tourniquet was placed around her upper left thigh and her  foot was prepped and draped in the toes to the ankle with DuraPrep  and  sterile drapes.  A time-out was called, and she was identified as the  correct patient and correct left foot.  I then used an Esmarch to wrap  out the foot and tourniquet was inflated to 350 millimeters of pressure.  Incision was then made directly over the dorsal lateral aspect of the  foot in line with the base of the fifth metatarsal, I dissected down to  the fifth metatarsal base.  I could see the course of the peroneus  brevis tendon and the base of the metatarsal itself.  There was partial  healing and then definitely incomplete union of the metatarsal base  fracture.  I then used a rongeur and took down the fracture to mobilize  in its entirety.  The remaining bone of the base of the fifth metatarsal  was sclerotic.  I did attempt to place a screw through this with a 2.0  mm mini-fragment screw, but it caused the bone to split apart.  At that  point, I then removed the bone from  the base of the fifth metatarsal and  stressed the foot under direct fluoroscopy and the foot was stable.  I  then irrigated the tissues and closed the deep tissue with #2 Vicryl  suture followed by interrupted 3-0 nylon on the skin.  Incision was then  infiltrated with 0.25% plain Marcaine and I placed Xeroform and well-  padded sterile dressing.  The patient was awakened, extubated, taken to  the recovery room in stable condition.   Postoperatively, I will allow her to weight bear as tolerated in a  postoperative shoe and she will keep her incision clean and dry with  removal of dressings in 2 days.  I will follow up her in the office in a  week to two weeks.      [name]. Magnus Ivan, M.D.  Electronically Signed     CYB/MEDQ  D:  01/30/2009  T:  01/31/2009  Job:  [id number]

## 2011-01-28 NOTE — Discharge Summary (Signed)
NAME:  [name], [name]                       ACCOUNT NO.:  [id number]   MEDICAL RECORD NO.:  [id number]                   PATIENT TYPE:  INP   LOCATION:  5023                                 FACILITY:  [facility]   PHYSICIAN:  Sharolyn Douglas, M.D.                     DATE OF BIRTH:  11-30-1950   DATE OF ADMISSION:  03/11/2004  DATE OF DISCHARGE:  03/15/2004                                 DISCHARGE SUMMARY   ADMITTING DIAGNOSES:  1. L4-L5 spondylolisthesis, degenerative disc disease.  2. Hypertension.  3. Depression.  4. Obesity.  5. History of migraines.  6. Hiatal hernia.  7. Hypokalemia.   DISCHARGE DIAGNOSES:  1. Status post L4-L5 posterior spinal fusion and laminectomy.  2. Postoperative hemorrhagic anemia that was stable, asymptomatic, and did     not require blood transfusion.  3. Hypokalemia.   PROCEDURE:  Laminectomy and posterior spinal fusion at L4-L5 with pedicle  screws and T lift.   SURGEON:  Sharolyn Douglas, M.D.   ASSISTANT:  Jill Side Mahar, P.A.-C.   ANESTHESIA:  General.   CONSULTATIONS:  None.   X-RAYS:  Portable spine on June 30 intraoperatively shows a localization at  L4-L5.   No EKG is seen on the chart at this time.   LABORATORY DATA:  Preoperatively on June 28, CBC with differential was  within normal limits with the exception of a white count of 12.5, and  absolute neutrophils of 8.1, platelets of 432,000.  H&H monitored  postoperatively reached of low of 10.1 and 29.2 on March 14, 2004.  She was  asymptomatic and did not require any blood transfusion.  PT, INR and PTT  preoperatively were normal with the exception of PT slightly decreased at  11.7.  Complete metabolic panel preoperatively was within normal limits with  the exception of potassium of 3.2, glucose of 145.  Also, and AST of 47, ALT  of 56.  Postoperatively, the basic metabolic panel monitored two days.  She  continued to have decreased potassium at 3.0 and 3.1 on July 1 and July 2  respectively.  Glucose was 140 and 130 on those days, and calcium was 7.9  and 8.2 on those days, otherwise normal.  UA from June 28 was negative.  Blood type was June 28 was type O, positive antibody screen negative.   BRIEF HISTORY:  The patient is a 60 year old female who has successfully  undergone neck surgery with an ACBF in April of this year, and did extremely  well.  However, her back continued to cause her pain.  She continued to fail  conservative management, and her pain continued to get worse and started to  interfere with activities of daily living and quality of life and suffering.  Secondary to her MRI findings as well as x-ray findings of degenerative  joint disease and spondylolisthesis at L4-L5, it was felt that her best  course of management  would be a posterior spinal fusion and laminectomy at  this level.  The risks and benefits of this procedure were discussed with  her by Dr. Noel Gerold as well as myself and with Idolina Primer.  She indicated  understanding and opted to proceed.   HOSPITAL COURSE:  On March 11, 2004, the patient was taken to the operating  room where the above procedure was done under anesthesia without any  intraoperative complications.  She tolerated the procedure well without any  intraoperative complications.  One Hemovac drain was placed  intraoperatively, and she was transferred to the recovery room.   On postoperative day #2, orthopedic spine protocol was followed including  prophylactic antibiotics, N.P.O. until she passed flatus, at which time the  diet was slowly advanced, pain control was obtained using a combination of a  PCA as well as p.o. analgesics.  She was transitioned over the p.o.  analgesics by postoperative day #3.  Pain control did remain good.  DVT  prophylaxis, early mobilization, physical therapy and occupational therapy  were consulted to work with her, and begun on postoperative day #1.  She  made gradual progress with physical  therapy.  She was using a brace when she  was up.  She does not need to have it on when she is sitting or lying.   Discharge planners were consulted to assist [name] with arranging her home  needs.  By March 15, 2004, the patient had met all orthopedic goals medically.  She was stable and ready for discharge home.  All of her home needs have  been arranged by Care Management Department.   DISCHARGE PLAN:  The patient is a 60 year old female status post posterior  spinal fusion at L4-L5, doing well.   DISCHARGE ACTIVITY:  Daily ambulation.  Avoid lifting heavier than 5 pounds.  Back precautions at all times.  Brace on when she is up, dressing changes  daily to the back.  Keep the incision dry x5 days at which time she may  begin to shower.   DISCHARGE DIET:  A regular home diet as tolerated.   MEDICATIONS:  1. Percocet p.r.n.  2. Robaxin p.r.n.  3. Multivitamin daily.  4. Calcium daily.  5. Laxative as needed.  6. Avoid NSAIDs x3 months.   FOLLOWUP:  Two weeks postoperative with Dr. Janene Harvey.   DISCHARGE CONDITION:  Stable and improved.   DISPOSITION:  The patient may be discharged to her home.      Verlin Fester, P.A.                       Sharolyn Douglas, M.D.   CM/MEDQ  D:  04/14/2004  T:  04/14/2004  Job:  [id number]

## 2011-01-28 NOTE — Op Note (Signed)
NAME:  [name], [name] NO.:  [id number]   MEDICAL RECORD NO.:  [id number]          PATIENT TYPE:  AMB   LOCATION:  SDS                          FACILITY:  [facility]   PHYSICIAN:  Sharolyn Douglas, M.D.        DATE OF BIRTH:  1951-02-28   DATE OF PROCEDURE:  05/17/2006  DATE OF DISCHARGE:                                 OPERATIVE REPORT   DIAGNOSIS:  Post-laminectomy pain syndrome with successful 1-week spinal  cord stimulator trial.   PROCEDURE:  1. Placement of implantable pulse generator for permanent spinal cord      stimulator over right buttock.  2. Programming of permanent spinal cord stimulator.   SURGEON:  Sharolyn Douglas, MD   ASSISTANT:  Verlin Fester, PA   ANESTHESIA:  General endotracheal.   ESTIMATED BLOOD LOSS:  Minimal.   COMPLICATIONS:  None.   NEEDLE AND SPONGE COUNT:  Correct.   INDICATIONS:  The patient is a pleasant 60 year old female with chronic back  and bilateral lower extremity pain secondary to post-laminectomy pain  syndrome, as well as lower extremity neuropathy.  She had a successful 1-  week spinal cord stimulator trial with excellent improvement in her pain.  She would like to move for placement of the IPG, risks and benefits  reviewed.   PROCEDURE:  After informed consent, the patient was taken to the operating  room.  She was given IV antibiotics prophylactically.  After general  anesthesia, she was turned prone onto the Wilson frame, all bony prominences  padded, face and eyes protected at all times.  The sutures were removed from  the midline incision.  The back was prepped and draped in the usual sterile  fashion.  We then explored the previous incision and retrieved the permanent  spinal cord stimulator leads.  The extension leads were cut and then pulled  through proximally.  These had previously been draped out of the surgical  field.  We then turned our attention to creating a pocket over the right hip  for placement of the  IPG.  This was purposely done below the patient's waist  level.  The Eon battery was then placed into the subcutaneous pocket which  had been created and there was a good fit.  We then used a tunneling tool to  tunnel the leads from the midline incision down towards the incision made  for the IPG.  Once this was accomplished, the leads were attached to the  IPG.  The IPG was tested and it was functioning fine.  We then placed the  IPG into its pocket and close the wounds in layers using 2-0 Vicryl, 3-0  Vicryl on the skin edges in a subcuticular fashion and then Benzoin on the  skin.  The patient was turned supine, extubated without difficulty and  transferred to Recovery in stable condition, able to move her upper and  lower extremities.  It should be noted that my assistant, Verlin Fester,  P.A., was present throughout the procedure including during the positioning,  the exposure, placement of the IPG and also testing of the device.  Postoperatively, the patient was shown how to use stimulator and programming  was carried out.  This was accomplished with the help of Helene Shoe,  the representative from [employer].      Sharolyn Douglas, M.D.  Electronically Signed     MC/MEDQ  D:  05/17/2006  T:  05/18/2006  Job:  [id number]

## 2011-01-28 NOTE — Op Note (Signed)
NAMEGEORGENA, [name] NO.:  [id number]   MEDICAL RECORD NO.:  [id number]          PATIENT TYPE:  OIB   LOCATION:  5003                         FACILITY:  [facility]   PHYSICIAN:  Sharolyn Douglas, M.D.        DATE OF BIRTH:  [birth year]   DATE OF PROCEDURE:  05/10/2006  DATE OF DISCHARGE:  05/11/2006                                 OPERATIVE REPORT   DIAGNOSES:  1. Post laminectomy pain syndrome.  2. Diabetic neuropathy with bilateral foot pain.  3. Chronic back pain.   PROCEDURES:  1. T10-11 thoracic laminotomy for placement of permanent trial spinal cord      stimulator lead.  2. Programming of permanent trial spinal cord stimulator lead.  3. Fluoroscopic guidance used for placement above the lead.   SURGEON:  Sharolyn Douglas, MD.   ASSISTANT:  Verlin Fester, P.A.   ANESTHESIA:  General endotracheal.   ESTIMATED BLOOD LOSS:  Minimal.   COMPLICATIONS:  None.   NEEDLE AND SPONGE COUNT:  Correct.   INDICATIONS:  The patient is a pleasant 60 year old female with  progressively worsening back and bilateral lower extremity pain.  She had a  previous L4-5 decompression and fusion.  She did well initially but has  redeveloped severe pain.  Her imaging studies show some recurrent stenosis  above her fusion.  She also has recently been diagnosed with diabetes and  has symptoms consistent with a peripheral neuropathy in her feet.  She has  failed all other attempts at conservative treatment and now elects to  undergo placement of a permanent trial spinal cord stimulator through a T10-  11 laminotomy.  This is a planned two-stage procedure.  If she sees good  relief of her pain over the next week, then she will return to the operating  room for placement of the IPG.  The risks, benefits and alternatives were  discussed and the patient has elected to proceed.   PROCEDURE:  She was identified in the holding area after informed consent  and taken to the operating room.  She was  given prophylactic IV antibiotics.  Underwent general endotracheal anesthesia.  Neuro monitoring was established  in the form of lower extremity EMGs and EMGs of the intercostal muscles.  She was turned prone onto the Sudley frame.  All bony prominences padded.  Face and eyes protected all times.  Back prepped and draped in the usual  sterile fashion.  Fluoroscopy was brought into the field and the T10-11  level was identified.  A 5 cm incision was made over the interspace and  dissection was carried through the deep fascia.  Subperiosteal exposure  carried out exposing the T10-11 interspace.  This was confirmed with  fluoroscopy.  Using Leksell rongeur along with __________  footed Kerrison  punches, a laminotomy was performed.  Great care was taken not to put any  pressure on the underlying spinal cord.  We then carefully slid a ANS  Tripole spinal cord stimulator lead into the epidural space up towards the  T8-9 disc space.  This was evaluated and placed using fluoroscopic  imaging.  We then used triggered EMGs to confirm that we were getting activity on both  the left and right side.  The midline leads were stimulating the EMGs  bilaterally.  We then anchored the stimulator lead using the anchoring  device along with a Prolene suture.  This was attached to the interspinous  ligament.  At this point, the deep fascia was closed tightly using a #1  Vicryl suture.  We then attached extension leads to the 2 leads coming off  of the spinal cord stimulator.  Using a tunneling tool, the extension leads  were externalized away from the incision towards the patient's right  shoulder.  We then coiled the remaining wires and closed the skin tightly  using a 3-0 nylon suture.  Sterile dressing was applied.  The patient was  turned supine, extubated and transferred to recovery in stable condition,  neurologically intact.  Working with the ANS representative Helene Shoe,  the stimulator was  programmed and we were able to obtain coverage all the  way down to the patient's feet bilaterally.  We will to further programming  tomorrow morning before the patient is discharged from the hospital and give  her several programs to use over the next week.  It should also be noted  that my assistant [facility], PA was present throughout the procedure  including the positioning, the exposure, placement of the stimulator lead  and also assisted with wound closure.      Sharolyn Douglas, M.D.  Electronically Signed     MC/MEDQ  D:  05/10/2006  T:  05/11/2006  Job:  [id number]

## 2011-01-28 NOTE — Discharge Summary (Signed)
NAMEIESHIA, HATCHER NO.:  [id number]   MEDICAL RECORD NO.:  [id number]          PATIENT TYPE:  INP   LOCATION:  5001                         FACILITY:  [facility]   PHYSICIAN:  Sharolyn Douglas, M.D.        DATE OF BIRTH:  04-24-1951   DATE OF ADMISSION:  08/24/2006  DATE OF DISCHARGE:  08/30/2006                               DISCHARGE SUMMARY   ADMISSION DIAGNOSES:  1. Adjacent segment stenosis and degenerative joint disease at L3-4      and L5-S1.  2. Hypertension.  3. Diabetes.   DISCHARGE DIAGNOSES:  1. Status post revision decompression of posterior spinal fusion L3-      S1.  2. Postoperative blood loss anemia.  3. Postoperative hypokalemia.   PROCEDURE:  On August 24, 2006, the patient was taken to the operating  room for revision posterior spinal effusion from L3-S1.   SURGEON:  Sharolyn Douglas, M.D.   ASSISTANT:  Jill Side Mahar, P.A.-C.   ANESTHESIA:  General.   CONSULTATIONS:  None.   LABORATORY DATA:  Preoperative CBC with diff showed a white count of  15.4, platelets of 457, absolute neutrophils 8.9, absolute lymphs of  4.9, absolute monos of 1, absolute eosinophils of 0.5.  Otherwise  normal.  H&H monitored x3 days postoperatively, reached a low of 9.6 and  27.5 on August 27, 2006.  Coag's preop were negative.  A complete  metabolic panel preop showed a potassium of 2.9, glucose 136, otherwise  normal.  Basic metabolic panel monitored x4 days postoperatively.  She  continued to have decreased potassium ranging from 2.8 to 3.4.  Glucose  remained elevated ranging from 113 to 134.  She did have one episode of  increased CO2 at 33 on August 27, 2006.  BUN was decreased at 3 and 5  on December 16 and December 17 respectively.  Calcium was decreased at  8.3 and 8.1 on December 14 and December 15 respectively.  Hemoglobin A1c  on August 24, 2006, was 6.2.  Urinalysis from preop was negative.  Blood typing was O+.  Antibody screen was negative.  Urine  culture from  December 12, showed multiple bacteria morphology present (likely  inappropriate collection).  X-rays portable chest on December 13, shows  no acute chest disease.  Lumbar spine from December 13, was used  intraoperatively showed L3-S1 pedicle screws.  December 19 x-rays of the  lumbar spine showed L3-S1 fusion and good position and alignment.  No  EKG is seen on the chart.   BRIEF HISTORY:  The patient is a 60 year old female who has had chronic  disabling low back pain and bilateral lower extremity pain.  She had a  previous lumbar decompression and fusion at L4-5 with temporarily  improvement in her pain.  Unfortunately, her pain did start to return.  She has also had a neuropathy in her feet that has been managed  successfully with the spinal cord stimulator.  Her imaging studies did  show advanced disk disease above and below her fusion.  She failed to  respond to conservative treatment.  Her pain was increasingly getting  worse and disabling.  Secondary to the imaging findings as well as her  failure to improve conservatively.  So her best course in management  would be a revision decompression and fusion extending fusion up to L3  and down to S1.  The risks and benefits of the procedure were discussed  at length with the patient by Dr. Noel Gerold as well as myself.  She  indicated understanding an opted to proceed.   HOSPITAL COURSE:  On August 24, 2006, the patient was taken to the  operating room with the above procedure.  She tolerated the procedure  well without any intraoperative complications and then transferred to  the recovery room in stable condition.   Postoperatively, she did have left lower extremity weakness in the  tibialis anterior.  This was monitored through her postoperative course,  she did begin to have weakness with dorsiflexion as well as eversion.  She did also have decreased sensation to the peroneal nerve  distribution.  An AFO brace was  ordered for the patient, it was fitted  on her in the room.  She said that it was very uncomfortable and  preferred not to use it.  Therefore, it was not given to her.   Postoperatively, physical therapy and occupational therapy did work with  her on a daily basis.  She was slowly progressed because of this left  lower extremity weakness.  However, she did get to the point that she  was independent safe with all activities, brace used as well as back  precautions and ambulation per her discharge.  She did have some  hypokalemia throughout her postoperative course, so she was treated with  K-Dur.  Continues to be low; however, did increase up to near normal at  3.4 prior to discharge.   By August 30, 2006, the patient was medically stable and  orthopedically stable and ready for discharge home.   DISCHARGE PLAN:  The patient is a 60 year old female status post spinal  fusion L3-S1.   ACTIVITY:  Daily ambulation program.  Brace on when she is up.  Back  precautions at all times.  No lifting greater than 5 pounds.  Dressing  to back as needed.  The patient may shower.   FOLLOWUP:  Followup in 2 weeks postoperatively with Dr. Noel Gerold.   DIET:  Regular home diet as tolerated.   CONDITION ON DISCHARGE:  Stable and improved.   DISPOSITION:  The patient will be discharged to her home with advanced  assistance as well as home health physical therapy and occupational  therapy.     Verlin Fester, P.A.      Sharolyn Douglas, M.D.  Electronically Signed   CM/MEDQ  D:  10/25/2006  T:  10/26/2006  Job:  [id number]   cc:   Sharolyn Douglas, M.D.

## 2011-01-28 NOTE — H&P (Signed)
NAMESERENITI, [name]             ACCOUNT NO.:  [id number]   MEDICAL RECORD NO.:  [id number]          PATIENT TYPE:  AMB   LOCATION:  SDS                          FACILITY:  [facility]   PHYSICIAN:  Verlin Fester, P.A.    DATE OF BIRTH:  08-Sep-1951   DATE OF ADMISSION:  DATE OF DISCHARGE:                              HISTORY & PHYSICAL   CHIEF COMPLAINT:  Low back and lower extremity pain.   HISTORY OF PRESENT ILLNESS:  The patient is a 60 year old female who has  had a long history problems with her back extending into her lower  extremities.  She was found to have spinal stenosis and degenerative  disease at L3-S1.  Secondary to these findings as well as her pain, it  was thought that her best course of management would be L3 to S1  revision, decompression, and fusion.  The risks and benefits of the  surgery were discussed with patient by Dr. Noel Gerold as well as myself.  She  indicated understanding and opted to proceed.   ALLERGIES:  NONE.   MEDICATIONS:  1. Toprol 100 mg daily.  2. Benicar 45/25 daily.  3. Cymbalta 90 mg daily.  4. Metformin 1000 mg twice daily.   PAST MEDICAL HISTORY:  1. Diabetes  2. Hypertension   PAST SURGICAL HISTORY:  1. Hernia repair in 1953.  2. Tonsillectomy in 1957.  3. C-section in 1987 and 1990.  4. D&C in 1989.  5. Cyst removed her from her breast in 1969.  6. Two sinus surgeries.  7. Fusion on cervical spine in 2005.  8. Lumbar fusion in 2005.  9. Spinal cord stimulator September 2007.   SOCIAL HISTORY:  Patient denies tobacco use or alchohol use.  She is  married.  Her husband and daughter will be available to help her through  her postoperative course.   FAMILY HISTORY:  Noncontributory.   REVIEW OF SYSTEMS:  Patient denies fevers, chills, sweats, or bleeding  tendencies.  CNS:  Denies blurred vision, double vision, seizures,  headaches, paralysis.  CARDIOVASCULAR:  Denies chest pain, angina,  orthopnea, claudication, or  palpitations.  PULMONARY:  Denies shortness  of breath, productive cough, hemoptysis.  GI:  Denies nausea, vomiting,  constipation, diarrhea, melena, or blood stools.  GU:  Denies dysuria,  hematuria, discharge.  MUSCULOSKELETAL:  As per HPI.   PHYSICAL EXAMINATION:  VITAL SIGNS:  Pulse 80 and regular, respirations  16 and unlabored.  GENERAL:  The patient is a 60 year old white female who is alert and  oriented in no acute distress.  She is well nourished, well groomed,  appears stated age, pleasant and cooperative to exam.  HEENT:  Normocephalic, atraumatic.  Pupils equal, round, reactive to  light.  Extraocular movements intact.  Nares patent, pharynx clear.  NECK:  Soft to palpation.  No lymphadenopathy, thyromegaly, or bruits  appreciated.  CHEST:  Clear to auscultation bilaterally.  No rales, rhonchi, wheezes,  or friction rubs.  BREASTS:  Not pertinent, not performed.  HEART:  S1, S2, regular rate and rhythm.  No murmurs, gallops, or rubs  noted.  ABDOMEN:  Soft  to palpation, nontender, nondistended, no organomegaly  noted.  GU:  Not pertinent no performed.  EXTREMITIES:  As per HPI.  SKIN:  Intact without any lesions or rashes.   IMPRESSION:  1. Adjacent segment stenosis and degenerative disease L3-S1.  2. Hypertension.  3. Diabetes.   PLAN:  Admit to [facility] on December 13, for a revision  decompression and posterior spinal fusion L3-S1.  Surgeon will be Dr.  Noel Gerold.      Verlin Fester, P.A.     CM/MEDQ  D:  08/22/2006  T:  08/22/2006  Job:  [id number]   cc:   Sharolyn Douglas, M.D.

## 2011-01-28 NOTE — Op Note (Signed)
NAME:  [name], [name]                       ACCOUNT NO.:  [id number]   MEDICAL RECORD NO.:  [id number]                   PATIENT TYPE:  OIB   LOCATION:  5020                                 FACILITY:  [facility]   PHYSICIAN:  Sharolyn Douglas, M.D.                     DATE OF BIRTH:  04/01/1951   DATE OF PROCEDURE:  09/25/2003  DATE OF DISCHARGE:                                 OPERATIVE REPORT   PREOPERATIVE DIAGNOSIS:  Cervical spondylotic radiculopathy, C4-5 and C5-6.   POSTOPERATIVE DIAGNOSIS:  Cervical spondylotic radiculopathy, C4-5 and C5-6.   OPERATION PERFORMED:  1. Anterior cervical diskectomy, C4-5 and C5-6.  2. Anterior cervical arthrodesis, C4-5 and C5-6 with placement of two     allograft prosthesis spacers packed with local autogenous bone graft.  3. Anterior cervical plating, C4 through C6 utilizing the Trinica system.   SURGEON:  Sharolyn Douglas, M.D.   ASSISTANT:  Verlin Fester, P.A.   ANESTHESIA:  General endotracheal.   COMPLICATIONS:  None.   INDICATIONS FOR PROCEDURE:  The patient is a 60 year old female with  persistent neck and left upper extremity radiculopathy.  She has failed all  conservative treatment options.  Her symptoms have progressed.  Plain  radiographs show degenerative changes, most significant C4-5, C5-6 and C6-7.  Her MRI scan reveals foraminal stenosis on the left side C4-5 and C5-6  consistent with her symptomatology.  She elected to undergo ACDF C4-5 and C5-  6 in hopes of improving her symptoms.  Risks, benefits and alternatives were  explained.   DESCRIPTION OF PROCEDURE:  The patient was properly identified in the  holding area and taken to the operating room.  She underwent general  endotracheal anesthesia without difficulty.  She was given prophylactic  intravenous antibiotics.  She was carefully positioned on the operating  table with the neck in slight extension using the Mayfield head rest.  All  bony prominences were padded.  Face and  eyes were protected all times.  The  neck was prepped and draped in the usual sterile fashion.  A 4 cm incision  was made on the left side in a natural skin crease at the level of the  thyroid cartilage.  Dissection was carried sharply through the platysma.  The interval between sternocleidomastoid and strap muscles medially was  developed down to the prevertebral space.  The first disk space identified  was C4-5 and spinal needle placed.  X-ray confirmed our location.  The  esophagus, trachea and carotid sheath were identified and protected at all  times.  The longus colli elevated out over the C4-5 and  C5-6 disk spaces.  The Shadowline retractor was placed.  There were large anterior osteophytes  noted at both C4-5 and C5-6 and these were removed with a Leksell rongeur.  We then completed diskectomies back to the posterior longitudinal ligament  C4-5 and C5-6.  The surgical microscope was draped  and brought into the  field.  Caspar distraction pins were placed at C4, C5, and C6 vertebral  bodies.  High speed bur was used to remove the cartilaginous end plate and  take down the uncovertebral joints.  We completed bilateral foraminotomies  with 2 mm Kerrisons at both the 4-5 and 5-6 levels.  We noted  a soft disk  protrusion into the foramen and lateral recess at C4-5.  At C5-6 stenosis  was primarily due to bony spurring.  The disk spaces were irrigated.  Gelfoam was used to control epidural bleeding.  We placed a 7 mm allograft  prosthesis spacer packed with local autogenous bone graft and __________  shaving in both C4-5 and the C5-6 levels. The Caspar distraction pins were  removed.  Weight was removed from the halter traction. A 42 mm Trinica plate  was placed at C4 through C6.  We used six 12 mm variable screws.  We had  excellent screw purchase.  The locking mechanism was engaged.  The  esophagus, trachea and carotid sheath were inspected. There were no apparent  injuries.   Intraoperative x-ray showed good position of the plate and  grafts.  Platysma was closed with an interrupted 2-0 Vicryl.  Subcutaneous  layer closed with interrupted 3-0 Vicryl followed by a running 4-0  subcuticular Vicryl suture to approximate the skin edges.  Benzoin and Steri-  Strips were placed.  Soft cervical collar applied.  The patient was  extubated without difficulty and transferred to the recovery room in stable  condition able to move her upper and lower extremities.                                               Sharolyn Douglas, M.D.    MC/MEDQ  D:  09/25/2003  T:  09/25/2003  Job:  [id number]

## 2011-01-28 NOTE — H&P (Signed)
NAME:  [name], [name]                       ACCOUNT NO.:  [id number]   MEDICAL RECORD NO.:  [id number]                   PATIENT TYPE:  INP   LOCATION:  NA                                   FACILITY:  [facility]   PHYSICIAN:  [name], M.D.                     DATE OF BIRTH:  February 19, 1951   DATE OF ADMISSION:  03/11/2004  DATE OF DISCHARGE:                                HISTORY & PHYSICAL   CHIEF COMPLAINT:  Pain in my back and right leg.   HISTORY OF PRESENT ILLNESS:  This 60 year old white female had successfully  undergone neck surgery for ACDF, C4-5 and C5-6 in April of this year.  She  had done very well with that.  She has had back pain now going on for a  considerable period of time.  As she has improved from her neck, her back  has become more and more interfering with her day-to-day activities.  She  really has difficulty in working as well as her pleasurable activities.  She  has neurogenic claudication in the right lower extremity.  She has had  epidural steroid injections in the lumbar area with some short-term  improvement but has continued with pain.  MRI has shown severe spinal  stenosis, L4-5, with facet hypertrophy and fluid actually within the joint  itself.  After much discussion and the fact that she has failed conservative  care and after all complications were discussed with the patient by Dr.  Noel Gerold, it was decided to go ahead with surgical intervention with an L4-5  laminectomy and fusion with pedicle screws, T-LIF, allograft, and BMP.   PAST MEDICAL HISTORY:  This lady has been in relatively good health.  Her  family physician is Dario Guardian, M.D., of [facility],  who has cleared her for surgery.  She has a history of migraines, which have  improved since her neck surgery; she has only had three this year since the  surgery itself.  She has hypertension, a heart murmur, a hiatal hernia.   Past surgeries include a herniorrhaphy in 1952,  tonsillectomy in 1957, a  benign breast cyst in 1970, rhinoplasty, two C-sections, D&C, and cervical  surgery in April of this year.   CURRENT MEDICATIONS:  1. Prozac 10 mg.  2. Cymbalta 60 mg daily.  3. Zanaflex 6 mg daily.  4. Bisoprolol/HCTZ 10/6.25 mg daily.   Has no medical allergies.   FAMILY HISTORY:  Positive for heart disease in a grandmother and mother,  hypertension and cancer.   SOCIAL HISTORY:  The patient is married, is a Psychologist, counselling Forms.  Has no intake of alcohol or tobacco products.  Has two  children, and the husband will be caregiver after surgery.   REVIEW OF SYSTEMS:  CENTRAL NERVOUS SYSTEM:  No seizure, __________,  paralysis, double vision.  The patient does have radiculopathy  consistent  with an L4-5 discopathy as mentioned in the present illness.  CARDIOVASCULAR:  No chest pain, no angina, no orthopnea.  RESPIRATORY:  The  patient has some shortness of breath with extreme exertion, but she can  navigate two stairs with no difficulty.  GASTROINTESTINAL:  No nausea,  vomiting, melena, or bloody stool.  GENITOURINARY:  No discharge of  hematuria.  MUSCULOSKELETAL:  Primarily in present illness.   PHYSICAL EXAMINATION:  GENERAL:  Alert and cooperative and delightful,  friendly 60 year old white female, who is well-groomed and fully alert.  VITAL SIGNS:  Blood pressure 132/72, pulse 76, respirations are 12.  HEENT:  Normocephalic, PERRLA, EOM intact, oropharynx is clear.  CHEST:  Clear to auscultation, no rhonchi, no rales.  CARDIAC:  Regular rate and rhythm.  There is a grade 3/6 murmur heard at the  apex area.  ABDOMEN:  Soft, nontender, hepatosplenomegaly not felt.  GENITOURINARY, RECTAL, PELVIC, BREASTS:  Not done, not pertinent to present  illness.  EXTREMITIES:  The patient has slightly positive straight leg when seated on  the right compared to the left.  DTRs and muscle is intact to lower  extremities.   ADMISSION  DIAGNOSES:  1. L4-5 degenerative disk disease with stenosis.  2. Hypertension.  3. History of migraines.  4. Hiatal hernia.  5. Anxiety and depression.  6. Resolving mild laryngitis.   PLAN:  The patient will undergo L4-5 laminectomy and fusion with pedicle  screws, T-LIF, allograft, and BMP.  Today she was fitted with an EBI brace,  which will be used postoperatively to protect the fusion.      Dooley L. Cherlynn June.                 [name], M.D.    DLU/MEDQ  D:  03/03/2004  T:  03/04/2004  Job:  [id number]   cc:   Dario Guardian, M.D.  [address]  [place]  [place] [address]  Fax: [phone]

## 2011-01-28 NOTE — H&P (Signed)
[name], [name]             ACCOUNT NO.:  [id number]   MEDICAL RECORD NO.:  [id number]          PATIENT TYPE:  AMB   LOCATION:  SDS                          FACILITY:  [facility]   PHYSICIAN:  Sharolyn Douglas, M.D.        DATE OF BIRTH:  1951-07-23   DATE OF ADMISSION:  05/10/2006  DATE OF DISCHARGE:                                HISTORY & PHYSICAL   CHIEF COMPLAINT:  Low back and bilateral lower extremity foot pain.   HISTORY OF PRESENT ILLNESS:  The patient is a 60 year old female with low  back and bilateral foot pain.  She has undergone previous lumbar surgeries  and has been left with this foot pain. It is thought because of the chronic  pain that failed to respond to conservative treatment, she would be a good  candidate for a spinal cord stimulator.  The risks and benefits of the  procedure were discussed with the patient.  She indicated understanding and  opted to proceed.   ALLERGIES:  Dristan causes tachycardia.   MEDICATIONS:  Benicar, Toprol, Cymbalta and Ambien.   PAST MEDICAL HISTORY:  Anxiety, depression and hypertension.   PAST SURGICAL HISTORY:  Hernia repair, tonsillectomy, sinus surgery x2,  cesarean section x2, ACDF and posterior spinal fusion.   SOCIAL HISTORY:  The patient denies tobacco use, denies alcohol use.  She is  married. Her husband will be available to help her postoperatively.   FAMILY HISTORY:  Noncontributory.   REVIEW OF SYSTEMS:  The patient denies fevers, chills, sweats or bleeding  tendencies.  CNS:  Denies blurred vision, double vision, seizures, headache,  paralysis.  CARDIOVASCULAR:  Denies chest pain, angina, orthopnea,  claudication or palpitations.  PULMONARY:  Denies productive cough,  hemoptysis or shortness of breath.  GI:  Denies nausea, vomiting,  constipation, diarrhea, melena, blood in stool.  GU:  Denies dysuria,  hematuria or discharge.  MUSCULOSKELETAL:  As per HPI.   PHYSICAL EXAMINATION:  VITAL SIGNS:  Blood pressure is  120/70, respiratory  rate 20 and unlabored, pulse is 100 and regular.  GENERAL:  This is a 60 year old white female well-healed is alert and  oriented, in no acute distress.  Well-nourished, well-groomed.  Appears  stated age.  Pleasant cooperative to exam.  HEENT:  Head is normocephalic, atraumatic, pupils equal round reactive to  light.  Extraocular movements are intact.  Nares patent.  Pharynx clear.  NECK:  Soft to palpation, no lymphadenopathy, thyromegaly or bruits  appreciated.  CHEST:  Clear to auscultation bilaterally.  No rales, rhonchi, stridor,  wheeze, or friction rubs present.  BREASTS:  Not pertinent not performed.  HEART:  S1, S2, regular rate and rhythm, no murmurs, gallops or rubs noted.  ABDOMEN:  Soft to palpation, nontender, nondistended, no organomegaly.  GU:  Not pertinent not performed.  EXTREMITIES:  As per HPI.  SKIN:  Intact without lesions or rashes.   IMPRESSION:  1. Post laminectomy syndrome with chronic pain.  2. Hypertension.   PLAN:  Admit to [facility] on May 10, 2006 for spinal cord  stimulator trial.  This will be  done by Dr. Noel Gerold.  She will return the  following week on May 17, 2006 for the permanent implant or removal of  the trial depending on her pain relief.  Both procedures will be done by Dr.  Noel Gerold.      Verlin Fester, P.A.      Sharolyn Douglas, M.D.  Electronically Signed    CM/MEDQ  D:  05/08/2006  T:  05/08/2006  Job:  [id number]

## 2011-01-28 NOTE — Op Note (Signed)
NAME:  [name], [name]                       ACCOUNT NO.:  [id number]   MEDICAL RECORD NO.:  [id number]                   PATIENT TYPE:  INP   LOCATION:  2550                                 FACILITY:  [facility]   PHYSICIAN:  Sharolyn Douglas, M.D.                     DATE OF BIRTH:  [birth year]   DATE OF PROCEDURE:  03/11/2004  DATE OF DISCHARGE:                                 OPERATIVE REPORT   DIAGNOSES:  Lumbar spondylosis and severe spinal stenosis, L4-5.   PROCEDURE:  1. Lumbar laminectomy, L4-5, with wide decompression of thecal sac and nerve     root bilaterally.  2. Posterior spinal fusion, L4-5.  3. Pedicle screw instrumentation, L4-5, using the [employer] System.  4. Transforaminal interbody fusion, L4-5 with placement of 12 mm peak     prosthetic cage.  5. Local autologous bone graft with 15 cc of graft-on bone graft substitute.  6. Neuro monitoring with testing of four pedicle screws and free-running     EMGS x3 hours.   SURGEON:  Sharolyn Douglas, M.D.   ASSISTANT:  Verlin Fester, P.A.   ANESTHESIA:  General endotracheal.   COMPLICATIONS:  None.   INDICATIONS:  Patient is a very pleasant 60 year old female with severe  lumbar spinal stenosis and spondylosis at L4-5.  She has been refractory to  all conservative care.  Her symptoms have progressed, and she has elected to  undergo decompression and fusion at L4-5 in hopes of improving her symptoms.  The risks, benefits and alternatives were reviewed.   PROCEDURE:  The patient was promptly identified in the holding area and  taken to the operating room.  She underwent general endotracheal anesthesia  without difficulty, given prophylactic IV antibiotics.  EMG leads were  placed for monitoring of triggered and free-running EMGs using the neuro  vision system.  She was carefully positioned prone on the [place] table on  the [place] frame.  All bony prominences were padded.  Face and eyes were  protected at all times.  The  back was prepped and draped in the usual  sterile fashion.  A 10 cm incision was made over the L4-5 interspace in the  midline.  Dissection carried sharply through the deep fascia.  Paraspinal  muscles elevated out over the tips of the transverse processes of L4-5  bilaterally.  We found the L4-5 facet joint to be hypertrophied and  degenerative.  Deep retractors were placed.  We then completed a central  laminectomy at L4-5.  We found the ligamentum flavum and facet joints to be  hypertrophied.  There was severe underlying spinal stenosis and lateral  recess narrowing.  The right L5 nerve root was being compressed within the  lateral recess.  Great care was taken to protect the underlying dura while  completing the decompression.  We monitored free-running EMGs.  In the end,  we had completely decompressed the thecal  sac and nerve roots bilaterally by  completing partial facetectomies.  We then turned out attention to  performing a transforaminal interbody fusion on the right side of L4-5.  The  inferior portion of the L4 facet joint was osteotomized.  We cut the L5  facet joint flush with the pedicle.  That created at transforaminal window.  We identified the transversing L5 and exiting L4 nerve roots.  The thecal  sac was gently mobilized.  Epidural bleeders were controlled with bipolar  electrocautery.  Sharp annulotomy carried out.  We then completed a radical  diskectomy across the contralateral side.  The cartilaginous end plates were  scraped.  I dilated disk space up to 12 mm.  We then tightly packed the disk  space with local autologous bone graft from the laminectomy, which had been  cleaned of all soft tissue and morselized.  This was supplemented with graft  on bone graft substitute.  We then inserted a 12 mm peak cage into the  interspace, carefully tamped it anterior and across the midline.  We  monitored free-running EMGs throughout this procedure, and there were no   deleterious changes.  We then turned our attention at placing pedicle screws  at L4-5, using anatomic probing technique.  Each pedicle position was  started using the awl.  The pedicle was cannulated using the __________  pedicle probe.  We were then able to palpate within each pedicle using the  ball-tip probe as well as within the spinal canal, and there were no  breeches.  Each pedicle hole was then tapped.  We placed 6.5 x 45 mm screws  at L5, 6.5 x 50 mm screws at L4.  We had excellent screw purchase.  Each  screw was then stimulated using triggered EMGs.  There were no deleterious  changes.  We then completed a posterior spinal arthrodesis by decorticating  the transverse processes at L4-5 bilaterally using a high-speed bur.  The  remaining bone graft and graft-on was packed into the lateral gutters.  We  placed short-segment titanium rods.  We placed locking caps and applied  general compression across the interspace before shearing off the locking  caps.  The wound was copiously irrigated.  We left Gelfoam over the exposed  epidural space.  Deep Hemovac drain left.  The fascia was closed with a  running #1 Vicryl, and subcutaneous layer closed with 2-0 Vicryl, and skin  closed with a running subcuticular suture.  Intraoperative x-rays showed  successful positioning of the screws and interbody graft.  Sterile dressing  was applied.  Patient was extubated without difficulty and transferred to  the recovery room in stable condition, able to move her upper and lower  extremities.                                               Sharolyn Douglas, M.D.    MC/MEDQ  D:  03/11/2004  T:  03/11/2004  Job:  [id number]

## 2011-01-28 NOTE — Op Note (Signed)
[name], [name] NO.:  [id number]   MEDICAL RECORD NO.:  [id number]          PATIENT TYPE:  OIB   LOCATION:  5001                         FACILITY:  [facility]   PHYSICIAN:  Sharolyn Douglas, M.D.        DATE OF BIRTH:  1951-01-20   DATE OF PROCEDURE:  08/24/2006  DATE OF DISCHARGE:                               OPERATIVE REPORT   DIAGNOSES:  1. Lumbar adjacent segment disease at L3-4 and L5-S1 above and below      previous L4-5 fusion.  2. Lumbar spinal stenosis.  3. Post laminectomy syndrome.  4. Chronic pain.   PROCEDURES:  1. Exploration of L4-5 fusion with removal of pedicle screw      instrumentation.  2. Revision lumbar laminectomy, L3-4, L4-5 and L5-S1, with wide      decompression of the thecal sac and nerve roots bilaterally.  3. Segmental pedicle screw instrumentation, L3 through S1, using the      [employer] system.  4. Posterior spinal fusion, L3 through S1.  5. Transforaminal lumbar interbody fusion at L3-4 and L5-S1 with      placement of two PEEK cages.  6. Local autogenous bone graft supplemented with bone morphogenic      protein.   SURGEON:  Sharolyn Douglas, MD.   ASSISTANTJill Side Mahar, PA   ANESTHESIA:  General endotracheal.   ESTIMATED BLOOD LOSS:  300 mL.   NEEDLE AND SPONGE COUNT:  Correct.   INDICATIONS:  The patient is a pleasant 60 year old female with chronic  disabling back and bilateral lower extremity pain.  She had a previous  lumbar decompression and fusion at L4-5 with temporary improvement.  She  also had an element of a neuropathy in her feet, and this has been  managed successfully with a spinal cord stimulator.  Her imaging studies  show advanced degenerative changes above and below her fusion with  spinal stenosis.  She now presents for revision laminectomy and fusion  from L3 to S1.  Risks, benefits, alternatives were reviewed.   PROCEDURE:  After informed consent, she was taken to the operating room.  She underwent general endotracheal anesthesia without difficulty, given  prophylactic IV antibiotics.  Neuro monitoring was established in the  form of lower extremity EMGs and SSEPs.  She was carefully turned prone  on the [place] frame, all bony prominences padded.  Face and eyes  protected all times.  Back prepped and draped in the usual sterile  fashion.  The previous midline incision was utilized, extended several  centimeters in each direction.  Dissection was carried through the scar  and deep fascia.  A subperiosteal exposure was carried out to the tips  the transverse processes of L3, L4, L5, and the sacral ala bilaterally.  The previous hardware was exposed.  We then removed the locking caps  from the pedicle screws using the appropriate T-handle screwdriver.  The  rods were then removed from the pedicle screw heads.  We then explored  the fusion with the electrocautery and also were able to pull on the  screws using Kocher clamps, confirming  that there was no motion  occurring between L4 and L5.  At this point deep retractors were placed  and further dissection was carried out over the bony anatomy.  We then  carefully dissected the edges of the previous laminectomy using loupes,  headlight magnification, and curettes.  There was a lot of scarring  noted with epidural fibrosis.  We were eventually able to enter the  spinal canal and using the high-speed bur along with Kerrison punches,  the laminectomy was revised by removing the residual spinous process and  lamina of L5 and the entire spinous process and lamina of L3.  Interestingly, the scarring and fibrosis extended out above and below  the previous laminectomy.  We found rather severe stenosis at L3-4  secondary to ligamentum flavum hypertrophy as well as enlargement of the  posterior elements.  We identified the L4, L5, and S1 nerve roots and  confirmed that they were decompressed.  We then turned our attention to  placing  pedicle screws at L3 as well as in the sacrum bilaterally.  Each  pedicle starting point was identified anatomically and initiated with  the awl.  The pedicles were then probed and palpated and there were no  breaches.  We placed 6.5 x 50 mm screws in L3 and 7.5 x 35 mm screws in  the sacrum bilaterally.  We had good screw purchase, and the bone  quality was good.  Each screw was then stimulated using triggered EMGs  and there were no deleterious changes.  Before placing the screws, the  transverse processes along with the sacral ala were decorticated using  the high-speed bur in preparation for the fusion.  We then elected to  proceed with a transforaminal lumbar interbody fusion at L3-4 and L5-S1  to improve the fusion rate and also further indirectly decompress the  nerve roots as well as remove the disk.  On the left side, the he facet  joints were osteotomized.  The exiting transversing nerve roots were  identified and protected at all times.  Starting at L5-S1, the disk  space was entered and a radical diskectomy was completed.  The  cartilaginous endplates were scraped clean.  The disk space was dilated  up to 9 mm.  We then packed the disk space with BMP sponges along with  local bone graft obtained from the laminectomy.  A 9-mm PEEK cage was  packed with a BMP sponge, inserted into the interspace, tamped  anteriorly and across the midline.  We had good distraction.  We then  performed a similar procedure at L3-4.  At this level, we utilized a 13-  mm PEEK cage.  Again the disk space was packed with local bone graft and  BMP sponges.  Again we achieved good distraction.  We then completed the  posterior spinal fusion by packing the remaining local bone graft into  the lateral gutters over the transverse processes.  Titanium rods were  then bent into the appropriate lordosis and placed into the polyaxial screw heads, and gentle compression was applied across L3-4 and L5-S1  before  shearing off the locking caps..  Hemostasis was achieved.  We  carefully inspected the dura over the area of maximal scarring and there  was a linear area at approximately L4-5 that was quite thin.  There was  no active CSF leak but the arachnoid was visible.  We used a 4-0 Nurolon  suture to oversew this using a running type of suture.  We  then placed a  small piece of Duragen over the area as well as Tisseel fibrin glue  prophylactically.  Gelfoam was left over the exposed epidural space.  A  deep Hemovac drain was left.  The deep fascia closed with a running #1  Vicryl suture.  Subcutaneous layer closed with 0 Vicryl and 2-0 Vicryl,  followed by a running 3-0 nylon suture on the skin edges.  A sterile  dressing was applied.  The patient was turned supine, extubated without  difficulty, and transferred to recovery in stable condition able to move  her upper and lower extremities.   It should be noted that my assistant, [facility], PA, was present  throughout the procedure, including during the positioning, the  exposure, the decompression, the fusion, the instrumentation, and she  also assisted with wound closure.      Sharolyn Douglas, M.D.  Electronically Signed     MC/MEDQ  D:  08/24/2006  T:  08/24/2006  Job:  [id number]

## 2011-01-28 NOTE — Op Note (Signed)
NAME:  AKYLA, VAVREK                       ACCOUNT NO.:  [id number]   MEDICAL RECORD NO.:  [id number]                   PATIENT TYPE:  AMB   LOCATION:  ENDO                                 [facility]ACILITY:  [facility]   PHYSICIAN:  Petra Kuba, M.D.                 DATE OF BIRTH:  17-Aug-1951   DATE OF PROCEDURE:  08/01/2002  DATE OF DISCHARGE:                                 OPERATIVE REPORT   PROCEDURE:  Colonoscopy with biopsy and polypectomy.   INDICATION:  The patient with some bloody diarrhea, due for colonic  screening.  Consent was signed after risks, benefits, methods, options  thoroughly discussed in the office.   MEDICINES USED:  Demerol 100, Versed 9.   DESCRIPTION OF PROCEDURE:  Rectal inspection was pertinent for external  hemorrhoids, small.  Digital exam was negative.  The pediatric video  adjustable colonoscope was inserted, easily advanced around the colon to the  cecum.  This did not require any abdominal pressure or any position changes.  Upon insertion, no obvious abnormality was seen.  The cecum was identified  by the appendiceal orifice and the ileocecal valve.  In fact, the scope was  inserted a short ways into the terminal ileum which was normal.  Photodocumentation and scattered biopsies were obtained and put in the first  container.  The scope was slowly withdrawn.  The prep was adequate.  There  was some liquid stool that required washing and suctioning.  On slow  withdrawal through the colon, random colon biopsies were obtained and put in  the second container.  As the scope was withdrawn, in the mid ascending, a  questionable polyp was seen and was cold biopsied x 2.  In the hepatic  flexure, a tiny to small polyp was seen and was hot biopsied x 1.  The scope  was further withdrawn.  There was one small patch in the descending of  erythema, questionable etiology, and this was biopsied as well and put in  the second container.  The scope was slowly  withdrawn.  In the distal  sigmoid, a tiny polyp was seen and was hot biopsied x 1 and put with the  other polyps in container #3.  The was withdrawn back to the rectum and  retroflexed, pertinent for some small internal hemorrhoids.  The scope was  straightened, and air was suctioned and scope removed.  The patient  tolerated the procedure well.  There was no obvious immediate complication.   ENDOSCOPIC DIAGNOSES:  1. Internal-external hemorrhoids.  2. Three tiny to small polyps in the distal sigmoid, hot.  Proximal     transverse or hepatic flexure, hot and ascending cold.  3. One minimal patch of erythema in the mid descending, status post     biopsied.  4. Otherwise within normal limits to the terminal ileum, status post random     biopsies throughout.   PLAN:  Await  pathology to determine future colonic screening as well as  medicine trials.  If loose stool continue, probably upper GI small-bowel  follow-through next.  We will check on her when we review her biopsies to  decide any other work-up and plans or follow-up at that juncture.  Call me  sooner p.r.n.                                              Petra Kuba, M.D.   MEM/MEDQ  D:  08/01/2002  T:  08/01/2002  Job:  [id number]   cc:   Dario Guardian, M.D.

## 2011-05-09 ENCOUNTER — Other Ambulatory Visit: Payer: Self-pay | Admitting: Family Medicine

## 2011-05-09 ENCOUNTER — Other Ambulatory Visit: Payer: Self-pay | Admitting: Obstetrics and Gynecology

## 2011-05-09 DIAGNOSIS — Z1231 Encounter for screening mammogram for malignant neoplasm of breast: Secondary | ICD-10-CM

## 2011-05-23 ENCOUNTER — Ambulatory Visit
Admission: RE | Admit: 2011-05-23 | Discharge: 2011-05-23 | Disposition: A | Discharge status: Home / Self Care | Payer: Medicare Other | Source: Ambulatory Visit | Attending: Family Medicine | Admitting: Family Medicine

## 2011-05-23 DIAGNOSIS — Z1231 Encounter for screening mammogram for malignant neoplasm of breast: Secondary | ICD-10-CM

## 2011-05-24 ENCOUNTER — Other Ambulatory Visit: Payer: Self-pay | Admitting: Family Medicine

## 2011-05-24 DIAGNOSIS — N644 Mastodynia: Secondary | ICD-10-CM

## 2011-05-30 ENCOUNTER — Ambulatory Visit
Admission: RE | Admit: 2011-05-30 | Discharge: 2011-05-30 | Disposition: A | Discharge status: Home / Self Care | Payer: Medicare Other | Source: Ambulatory Visit | Attending: Family Medicine | Admitting: Family Medicine

## 2011-05-30 DIAGNOSIS — N644 Mastodynia: Secondary | ICD-10-CM

## 2011-06-29 LAB — DIFFERENTIAL
Eosinophils Absolute: 0.5
Eosinophils Relative: 4
Lymphs Abs: 5.4 — ABNORMAL HIGH
Monocytes Absolute: 1.1 — ABNORMAL HIGH
Monocytes Relative: 7

## 2011-06-29 LAB — CBC
HCT: 37.2
MCV: 85
RBC: 4.38
WBC: 14.4 — ABNORMAL HIGH

## 2011-07-05 ENCOUNTER — Other Ambulatory Visit (HOSPITAL_COMMUNITY)
Admission: RE | Admit: 2011-07-05 | Discharge: 2011-07-05 | Disposition: A | Discharge status: Home / Self Care | Payer: Medicare Other | Source: Ambulatory Visit | Attending: Obstetrics and Gynecology | Admitting: Obstetrics and Gynecology

## 2011-07-05 ENCOUNTER — Other Ambulatory Visit: Payer: Self-pay | Admitting: Obstetrics and Gynecology

## 2011-07-05 DIAGNOSIS — Z01419 Encounter for gynecological examination (general) (routine) without abnormal findings: Secondary | ICD-10-CM | POA: Insufficient documentation

## 2011-07-21 ENCOUNTER — Telehealth: Payer: Self-pay | Admitting: Hematology & Oncology

## 2011-07-21 NOTE — Telephone Encounter (Signed)
Pt called scheduled 12-21 appointment

## 2011-08-09 ENCOUNTER — Other Ambulatory Visit (HOSPITAL_COMMUNITY): Payer: Self-pay | Admitting: Gastroenterology

## 2011-08-09 DIAGNOSIS — R110 Nausea: Secondary | ICD-10-CM

## 2011-08-09 DIAGNOSIS — R1084 Generalized abdominal pain: Secondary | ICD-10-CM

## 2011-08-19 ENCOUNTER — Other Ambulatory Visit: Payer: Self-pay | Admitting: Gastroenterology

## 2011-08-23 ENCOUNTER — Encounter (HOSPITAL_COMMUNITY)
Admission: RE | Admit: 2011-08-23 | Discharge: 2011-08-23 | Disposition: A | Discharge status: Home / Self Care | Payer: Medicare Other | Source: Ambulatory Visit | Attending: Gastroenterology | Admitting: Gastroenterology

## 2011-08-23 DIAGNOSIS — R1084 Generalized abdominal pain: Secondary | ICD-10-CM

## 2011-08-23 DIAGNOSIS — R109 Unspecified abdominal pain: Secondary | ICD-10-CM | POA: Insufficient documentation

## 2011-08-23 DIAGNOSIS — K3189 Other diseases of stomach and duodenum: Secondary | ICD-10-CM | POA: Insufficient documentation

## 2011-08-23 DIAGNOSIS — R110 Nausea: Secondary | ICD-10-CM

## 2011-08-23 MED ORDER — TECHNETIUM TC 99M SULFUR COLLOID
2.0000 | Freq: Once | INTRAVENOUS | Status: AC | PRN
  Administered 2011-08-23: 2 via ORAL

## 2011-09-02 ENCOUNTER — Ambulatory Visit (HOSPITAL_BASED_OUTPATIENT_CLINIC_OR_DEPARTMENT_OTHER): Payer: Medicare Other | Admitting: Hematology & Oncology

## 2011-09-02 ENCOUNTER — Other Ambulatory Visit (HOSPITAL_BASED_OUTPATIENT_CLINIC_OR_DEPARTMENT_OTHER): Payer: Medicare Other | Admitting: Lab

## 2011-09-02 DIAGNOSIS — K2940 Chronic atrophic gastritis without bleeding: Secondary | ICD-10-CM

## 2011-09-02 DIAGNOSIS — D472 Monoclonal gammopathy: Secondary | ICD-10-CM

## 2011-09-02 DIAGNOSIS — D509 Iron deficiency anemia, unspecified: Secondary | ICD-10-CM

## 2011-09-02 DIAGNOSIS — E119 Type 2 diabetes mellitus without complications: Secondary | ICD-10-CM

## 2011-09-02 DIAGNOSIS — G609 Hereditary and idiopathic neuropathy, unspecified: Secondary | ICD-10-CM

## 2011-09-02 LAB — CBC WITH DIFFERENTIAL (CANCER CENTER ONLY)
BASO#: 0 10*3/uL (ref 0.0–0.2)
EOS%: 4.4 % (ref 0.0–7.0)
HCT: 40.7 % (ref 34.8–46.6)
HGB: 13.9 g/dL (ref 11.6–15.9)
LYMPH#: 4.7 10*3/uL — ABNORMAL HIGH (ref 0.9–3.3)
MCH: 29.7 pg (ref 26.0–34.0)
MCHC: 34.2 g/dL (ref 32.0–36.0)
MCV: 87 fL (ref 81–101)
MONO%: 7.9 % (ref 0.0–13.0)
NEUT%: 45.7 % (ref 39.6–80.0)

## 2011-09-02 NOTE — Progress Notes (Signed)
CC:   Christine Chard, MD [name]. [name], M.D. [name], M.D.  DIAGNOSES: 1. IgG kappa monoclonal gammopathy of undetermined significance. 2. Diabetic gastroparesis. 3. Peripheral neuropathy, multifactorial.  CURRENT THERAPY:  Observation.  INTERIM HISTORY:  [name] comes in for followup.  We last saw her back in March.  At that point in time, her monoclonal spike was stable at 0.58 g/dL.  Her IgG level was 1410 mg/dL.  Her serum kappa light chain was 0.69 mg/dL.  Everything really is stable.  She has lost some weight.  She feels tired.  She still has sweating.  The neuropathy is worse on her left side.  She has not noticed any kind of swelling.  She does have diabetic gastroparesis.  She had a gastric emptying study.  She is on Reglan for this.  She otherwise has not had any changes in her status from a medical point of view.  There has been no change in her medications.  PHYSICAL EXAM:  General:  This is a well-developed, well-nourished white female in no obvious distress.  Vital Signs:  Temperature of 97.8, pulse 73, respiratory rate 16, blood pressure 136/72.  Weight is 215. Head/Neck:  Exam shows a normocephalic, atraumatic skull.  There are no ocular or oral lesions.  There are no palpable cervical or supraclavicular lymph nodes.  Lungs:  Clear bilaterally.  Cardiac: Regular rate and rhythm with a normal S1, S2.  There are no murmurs, rubs or bruits.  Abdomen:  Soft with good bowel sounds.  There is no palpable abdominal mass.  There is no fluid wave.  No palpable hepatosplenomegaly.  Back:  No tenderness of the spine, ribs, or hips. Extremities:  Some trace nonpitting edema of the lower legs.  She is slightly hypersensitive to touch in her lower legs.  She has good pulses in her distal extremities.  She has no palpable venous cord. Neurologic:  Exam shows no focal neurological deficits outside of the hypersensitivity to touch.  LABORATORY STUDIES:  All  pending.  IMPRESSION:  [name] is a 60 year old white female with diabetes. She has peripheral neuropathy.  I think the peripheral neuropathy is multifactorial.  She has an IgG kappa monoclonal gammopathy of undetermined significance which is relatively stable.  Of note, she did have an upper endoscopy.  Biopsies were taken which showed some chronic active gastritis.  There is no evidence of Helicobacter or malignancy.  This was a couple of weeks ago.  We will plan to get her back in about 6 months' time.  She had her mammogram done back in September.  The mammogram showed no evidence of malignancy.  I just feel bad for her and this neuropathy.  She is trying her best to maintain an active lifestyle.    ______________________________ Josph Macho, M.D. PRE/MEDQ  D:  09/02/2011  T:  09/02/2011  Job:  [id number]  ADDENDUM:  M-Spike is 0.74 g/dL.  IgG is 1670 mg/dL. KAPPA is 2.64 mg/dL

## 2011-09-02 NOTE — Progress Notes (Signed)
This office note has been dictated.

## 2011-09-07 LAB — PROTEIN ELECTROPHORESIS, SERUM, WITH REFLEX
Beta 2: 4.8 % (ref 3.2–6.5)
Beta Globulin: 6.4 % (ref 4.7–7.2)
Gamma Globulin: 18.7 % (ref 11.1–18.8)
M-Spike, %: 0.74 g/dL
Total Protein, Serum Electrophoresis: 7.6 g/dL (ref 6.0–8.3)

## 2011-09-07 LAB — KAPPA/LAMBDA LIGHT CHAINS: Kappa free light chain: 2.64 mg/dL — ABNORMAL HIGH (ref 0.33–1.94)

## 2011-09-07 LAB — COMPREHENSIVE METABOLIC PANEL
AST: 65 U/L — ABNORMAL HIGH (ref 0–37)
BUN: 9 mg/dL (ref 6–23)
Calcium: 9.6 mg/dL (ref 8.4–10.5)
Chloride: 101 mEq/L (ref 96–112)
Creatinine, Ser: 0.66 mg/dL (ref 0.50–1.10)

## 2011-09-07 LAB — IRON AND TIBC: UIBC: 342 ug/dL (ref 125–400)

## 2011-09-07 LAB — IGG, IGA, IGM: IgM, Serum: 112 mg/dL (ref 52–322)

## 2011-09-07 LAB — IFE INTERPRETATION

## 2011-09-21 ENCOUNTER — Telehealth: Payer: Self-pay | Admitting: *Deleted

## 2011-09-21 NOTE — Telephone Encounter (Signed)
Pt left message wanting to know the results of her labs from 09/02/11. Returned call; no answer. Left message stating that her labs have been printed off with a note asking Dr Myna Hidalgo to call her with the results. Asked her to please answer her phone if it comes up unavailable as it will most likely be him.

## 2012-02-13 ENCOUNTER — Other Ambulatory Visit: Payer: Medicare Other | Admitting: Lab

## 2012-02-13 ENCOUNTER — Telehealth: Payer: Self-pay | Admitting: Hematology & Oncology

## 2012-02-13 ENCOUNTER — Ambulatory Visit: Payer: Medicare Other | Admitting: Hematology & Oncology

## 2012-02-13 NOTE — Telephone Encounter (Signed)
md called in sick.  i called pt and resch apt.  Pt sch apt for 02/29/12

## 2012-02-29 ENCOUNTER — Ambulatory Visit: Payer: Medicare Other | Admitting: Hematology & Oncology

## 2012-02-29 ENCOUNTER — Telehealth: Payer: Self-pay | Admitting: Hematology & Oncology

## 2012-02-29 ENCOUNTER — Other Ambulatory Visit: Payer: Medicare Other | Admitting: Lab

## 2012-02-29 NOTE — Telephone Encounter (Signed)
Pt cx 6-19 appointment

## 2012-03-02 ENCOUNTER — Other Ambulatory Visit: Payer: Medicare Other | Admitting: Lab

## 2012-03-02 ENCOUNTER — Ambulatory Visit: Payer: Medicare Other | Admitting: Hematology & Oncology

## 2012-05-25 ENCOUNTER — Telehealth: Payer: Self-pay | Admitting: Hematology & Oncology

## 2012-05-25 NOTE — Telephone Encounter (Signed)
Pt scheduled 9-25 MD appointment

## 2012-06-06 ENCOUNTER — Other Ambulatory Visit (HOSPITAL_BASED_OUTPATIENT_CLINIC_OR_DEPARTMENT_OTHER): Payer: Medicare Other | Admitting: Lab

## 2012-06-06 ENCOUNTER — Ambulatory Visit (HOSPITAL_BASED_OUTPATIENT_CLINIC_OR_DEPARTMENT_OTHER): Payer: Medicare Other | Admitting: Hematology & Oncology

## 2012-06-06 VITALS — BP 128/61 | HR 86 | Temp 98.7°F | Resp 18 | Ht 65.0 in | Wt 216.0 lb

## 2012-06-06 DIAGNOSIS — D472 Monoclonal gammopathy: Secondary | ICD-10-CM

## 2012-06-06 DIAGNOSIS — G63 Polyneuropathy in diseases classified elsewhere: Secondary | ICD-10-CM

## 2012-06-06 DIAGNOSIS — E119 Type 2 diabetes mellitus without complications: Secondary | ICD-10-CM

## 2012-06-06 LAB — CBC WITH DIFFERENTIAL (CANCER CENTER ONLY)
BASO#: 0.1 10*3/uL (ref 0.0–0.2)
BASO%: 0.5 % (ref 0.0–2.0)
EOS%: 3.9 % (ref 0.0–7.0)
HCT: 39.9 % (ref 34.8–46.6)
MCHC: 34.1 g/dL (ref 32.0–36.0)
MCV: 88 fL (ref 81–101)
MONO%: 7 % (ref 0.0–13.0)
NEUT#: 5 10*3/uL (ref 1.5–6.5)
RBC: 4.54 10*6/uL (ref 3.70–5.32)
RDW: 12.8 % (ref 11.1–15.7)
WBC: 13.2 10*3/uL — ABNORMAL HIGH (ref 3.9–10.0)

## 2012-06-06 NOTE — Progress Notes (Signed)
This office note has been dictated.

## 2012-06-06 NOTE — Patient Instructions (Signed)
Call if pr\onblems

## 2012-06-07 NOTE — Progress Notes (Signed)
CC:   Reita Chard, MD [name]. Magnus Ivan, M.D. Rene Kocher, M.D.  DIAGNOSES: 1. IgG kappa monoclonal gammopathy of undetermined significance. 2. Insulin-dependent diabetes. 3. Peripheral neuropathy.  CURRENT THERAPY:  Observation.  INTERIM HISTORY:  Ms. Kunzler comes in for followup.  She now has a spinal stimulator in place.  This has helped her a little bit with the neuropathy.  She still has the other health issues.  She did have back surgery, so this may be contributing to the neuropathy.  She has the diabetes.  She also has the MGUS.  When we last saw her in December of last year.  When we saw her, her monoclonal spike was 0.74 g/dL.  Her IgG level was 1670 mg/dL.  Her kappa light chain was 2.649 mg/dL.  When we also saw her back in December, her ferritin was 147 with iron saturation of 19%.  She has had no obvious bleeding.  She has had no fever.  She has terrible hot flashes.  She has been tried on different agents for this.  She has had some occasional leg swelling.  She has had no obvious rashes.  She has had no fever.  There have been no problems with diarrhea.  PHYSICAL EXAMINATION:  This is a slight pale appearing white female. She is diaphoretic, which is  common for her.  She is alert and orient x3.  Vital signs:  Temperature of 98.7, pulse 86, respiratory rate 18, blood pressure 128/61.  Weight is 216.  Head and neck:  Normocephalic, atraumatic skull.  She has no ocular or oral lesions.  There is no adenopathy in her neck.  Lungs:  Clear bilaterally.  There are no rales, wheeze or rhonchi.  Cardiac:  Regular rate and rhythm with a normal S1 and S2.  She has a 1/6 systolic ejection murmur.  Abdomen:  Soft with good bowel sounds.  There is no fluid wave.  There is no palpable abdominal mass.  There is no palpable hepatosplenomegaly.  Back:  No tenderness over the spine, ribs, or hips.  Extremities:  No clubbing, cyanosis or edema.  Neurological:  No  focal neurological deficits.  LABORATORY STUDIES:  White cell count is 13.2, hemoglobin 13.6, hematocrit 39.9, platelet count 328.  Her calcium is 10 with an albumin of 4.6.  Her BUN is 11, creatinine 0.9.  Her ferritin is 79.  IMPRESSION:  Ms. Chrobak is a 61 year old white female with this IgG kappa MGUS.  We have been following her for several years.  So far, this MGUS has remained at a relatively low level.  It is still hard to know whether or not this MGUS is related to the neuropathy.  She does have diabetes and she does have the back surgery, which are contributing to her neuropathy issues.  She is not anemic.  I do not see anything that would suggest an obvious progression of her M spike.  I do want to get her back in another couple of months.  I want to make sure that we just try to stay on top of her issues and hopefully maybe intervene if necessary.    ______________________________ Josph Macho, M.D. PRE/MEDQ  D:  06/06/2012  T:  06/07/2012  Job:  [id number]

## 2012-06-09 LAB — FERRITIN: Ferritin: 79 ng/mL (ref 10–291)

## 2012-06-09 LAB — COMPREHENSIVE METABOLIC PANEL
ALT: 27 U/L (ref 0–35)
Albumin: 4.6 g/dL (ref 3.5–5.2)
CO2: 32 mEq/L (ref 19–32)
Calcium: 10 mg/dL (ref 8.4–10.5)
Chloride: 97 mEq/L (ref 96–112)
Potassium: 3.6 mEq/L (ref 3.5–5.3)
Sodium: 141 mEq/L (ref 135–145)
Total Protein: 7.8 g/dL (ref 6.0–8.3)

## 2012-06-09 LAB — KAPPA/LAMBDA LIGHT CHAINS: Kappa:Lambda Ratio: 0.86 (ref 0.26–1.65)

## 2012-06-11 ENCOUNTER — Telehealth: Payer: Self-pay | Admitting: *Deleted

## 2012-06-11 NOTE — Telephone Encounter (Signed)
Called patient to let her know that her labwork still looks good.  Explained to patient that her iron is a little low but this can be treated by diet .  Explained the foods that she should eat such as greens, raisins, and red meat.

## 2012-06-11 NOTE — Telephone Encounter (Signed)
Message copied by Anselm Jungling on Mon Jun 11, 2012 11:21 AM ------      Message from: Arlan Organ R      Created: Mon Jun 11, 2012  7:43 AM       Call - labs still look ok!!!  Iron may be a little low, but this can be treated by diet!!  Eat greens, raisins, red meat!!  Cindee Lame

## 2012-06-14 ENCOUNTER — Other Ambulatory Visit: Payer: Self-pay

## 2012-06-19 ENCOUNTER — Other Ambulatory Visit: Payer: Self-pay | Admitting: Family Medicine

## 2012-06-19 DIAGNOSIS — Z1231 Encounter for screening mammogram for malignant neoplasm of breast: Secondary | ICD-10-CM

## 2012-07-17 ENCOUNTER — Ambulatory Visit: Payer: Medicare Other

## 2012-07-26 ENCOUNTER — Ambulatory Visit (HOSPITAL_BASED_OUTPATIENT_CLINIC_OR_DEPARTMENT_OTHER): Payer: Medicare Other | Admitting: Medical

## 2012-07-26 ENCOUNTER — Other Ambulatory Visit (HOSPITAL_BASED_OUTPATIENT_CLINIC_OR_DEPARTMENT_OTHER): Payer: Medicare Other | Admitting: Lab

## 2012-07-26 VITALS — BP 138/73 | HR 81 | Temp 98.9°F | Resp 18 | Ht 65.0 in | Wt 207.0 lb

## 2012-07-26 DIAGNOSIS — E119 Type 2 diabetes mellitus without complications: Secondary | ICD-10-CM

## 2012-07-26 DIAGNOSIS — D472 Monoclonal gammopathy: Secondary | ICD-10-CM

## 2012-07-26 DIAGNOSIS — M549 Dorsalgia, unspecified: Secondary | ICD-10-CM

## 2012-07-26 DIAGNOSIS — G609 Hereditary and idiopathic neuropathy, unspecified: Secondary | ICD-10-CM

## 2012-07-26 LAB — CBC WITH DIFFERENTIAL (CANCER CENTER ONLY)
BASO#: 0.1 10*3/uL (ref 0.0–0.2)
BASO%: 0.5 % (ref 0.0–2.0)
EOS%: 3 % (ref 0.0–7.0)
HGB: 13.8 g/dL (ref 11.6–15.9)
LYMPH#: 3.8 10*3/uL — ABNORMAL HIGH (ref 0.9–3.3)
MCHC: 33.9 g/dL (ref 32.0–36.0)
NEUT#: 6.6 10*3/uL — ABNORMAL HIGH (ref 1.5–6.5)
Platelets: 363 10*3/uL (ref 145–400)
RDW: 13.1 % (ref 11.1–15.7)

## 2012-07-26 NOTE — Progress Notes (Signed)
Diagnoses: #1 IgG kappa monoclonal gammopathy of undetermined significance. #2 insulin-dependent diabetes. #3 peripheral neuropathy.  Current therapy: Observation.  Interim history: [name] presents for an office followup visit.  Overall, she, reports, that she's doing relatively well.  She still continues to have some back issues, and remains with a spinal stimulator in place.  She still continues to have neuropathy.  She does have diabetes, and reports, her sugars are well-controlled.  When we saw her last back in September, her IgG level was 1610 mg/dL.  Her kappa light chain was 1.8, 1 mg/dL.  The last myocardial spike, we have on her back in December was 0.74 g/dL. .  In terms of the MGUS, she is not anemic.  There is no evidence that would suggest any obvious progression of her M spike.  She does not have any renal issues.  She reports, that she has a decent appetite.  She denies any nausea, vomiting, diarrhea, constipation, any chest pain, shortness of breath, or cough.  She denies any fevers, chills, or night sweats.  She denies any lower leg swelling.  She denies any obvious, bleeding.  She denies any headaches, visual changes, or rashes.  Review of Systems: Constitutional:Negative for malaise/fatigue, fever, chills, weight loss, diaphoresis, activity change, appetite change, and unexpected weight change.  HEENT: Negative for double vision, blurred vision, visual loss, ear pain, tinnitus, congestion, rhinorrhea, epistaxis sore throat or sinus disease, oral pain/lesion, tongue soreness Respiratory: Negative for cough, chest tightness, shortness of breath, wheezing and stridor.  Cardiovascular: Negative for chest pain, palpitations, leg swelling, orthopnea, PND, DOE or claudication Gastrointestinal: Negative for nausea, vomiting, abdominal pain, diarrhea, constipation, blood in stool, melena, hematochezia, abdominal distention, anal bleeding, rectal pain, anorexia and hematemesis.    Genitourinary: Negative for dysuria, frequency, hematuria,  Musculoskeletal: Negative for myalgias, back pain, joint swelling, arthralgias and gait problem.  Skin: Negative for rash, color change, pallor and wound.  Neurological:. Negative for dizziness/light-headedness, tremors, seizures, syncope, facial asymmetry, speech difficulty, weakness, numbness, headaches and paresthesias.  Hematological: Negative for adenopathy. Does not bruise/bleed easily.  Psychiatric/Behavioral:  Negative for depression, no loss of interest in normal activity or change in sleep pattern.   Physical Exam: This is a well-developed, well-nourished, 61 year old, white female, in no obvious distress Vitals: Temperature 90.9 degrees, pulse 81, respirations 18, blood pressure 130/73, weight 207 pounds HEENT reveals a normocephalic, atraumatic skull, no scleral icterus, no oral lesions  Neck is supple without any cervical or supraclavicular adenopathy.  Lungs are clear to auscultation bilaterally. There are no wheezes, rales or rhonci Cardiac is regular rate and rhythm with a normal S1 and S2. There are no murmurs, rubs, or bruits.  Abdomen is soft with good bowel sounds, there is no palpable mass. There is no palpable hepatosplenomegaly. There is no palpable fluid wave.  Musculoskeletal no tenderness of the spine, ribs, or hips.  Extremities there are no clubbing, cyanosis, or edema.  Skin no petechia, purpura or ecchymosis Neurologic is nonfocal.  Laboratory Data: White count 11.5, hemoglobin 13.8, hematocrit 40.7, platelets 363,000  Current Outpatient Prescriptions on File Prior to Visit  Medication Sig Dispense Refill  . BENICAR HCT 20-12.5 MG per tablet 1 tablet Daily.       Marland Kitchen HYDROcodone-ibuprofen (VICOPROFEN) 7.5-200 MG per tablet 1 tablet every 8 (eight) hours as needed.       . metFORMIN (GLUCOPHAGE-XR) 500 MG 24 hr tablet Take 1,000 mg by mouth Twice daily. Total of 2,000 mg      .  metoprolol (TOPROL-XL)  100 MG 24 hr tablet Take 100 mg by mouth daily.        Marland Kitchen morphine (MS CONTIN) 60 MG 12 hr tablet 60 mg 2 (two) times daily.       . rosuvastatin (CRESTOR) 10 MG tablet Take 10 mg by mouth daily.        Marland Kitchen venlafaxine (EFFEXOR-XR) 75 MG 24 hr capsule Take 150 mg by mouth daily.        Assessment/Plan: This is a pleasant, 61 year old, white female, with the following issues:  #1.  IgG kappa MGUS.  We have been following her for several years now.  So far, this has remained at a relatively low level.  Again, she is not anemic.  There really is no evidence currently, that would suggest any obvious progression of her M spike.  We will continue to monitor her closely.  #2.  Neuropathy.  This is most likely secondary to her diabetes, and possibly, her back issues.  #3.  Followup.  We will follow back up with [name] in 2 months, but before then should there be questions or concerns.

## 2012-07-30 LAB — COMPREHENSIVE METABOLIC PANEL
ALT: 15 U/L (ref 0–35)
AST: 19 U/L (ref 0–37)
Albumin: 4.7 g/dL (ref 3.5–5.2)
Calcium: 9.8 mg/dL (ref 8.4–10.5)
Chloride: 99 mEq/L (ref 96–112)
Potassium: 3.4 mEq/L — ABNORMAL LOW (ref 3.5–5.3)
Sodium: 140 mEq/L (ref 135–145)
Total Protein: 7.8 g/dL (ref 6.0–8.3)

## 2012-07-30 LAB — PROTEIN ELECTROPHORESIS, SERUM, WITH REFLEX
Albumin ELP: 51.9 % — ABNORMAL LOW (ref 55.8–66.1)
Alpha-2-Globulin: 13.7 % — ABNORMAL HIGH (ref 7.1–11.8)
Beta Globulin: 6.8 % (ref 4.7–7.2)
Total Protein, Serum Electrophoresis: 7.8 g/dL (ref 6.0–8.3)

## 2012-07-30 LAB — IGG, IGA, IGM: IgM, Serum: 78 mg/dL (ref 52–322)

## 2012-08-23 ENCOUNTER — Ambulatory Visit: Payer: Medicare Other

## 2012-09-25 ENCOUNTER — Ambulatory Visit: Payer: Medicare Other

## 2012-09-27 ENCOUNTER — Other Ambulatory Visit (HOSPITAL_BASED_OUTPATIENT_CLINIC_OR_DEPARTMENT_OTHER): Payer: Medicare Other | Admitting: Lab

## 2012-09-27 ENCOUNTER — Ambulatory Visit (HOSPITAL_BASED_OUTPATIENT_CLINIC_OR_DEPARTMENT_OTHER): Payer: Medicare Other | Admitting: Hematology & Oncology

## 2012-09-27 VITALS — BP 132/69 | HR 18 | Temp 97.4°F | Resp 20 | Wt 211.0 lb

## 2012-09-27 DIAGNOSIS — D472 Monoclonal gammopathy: Secondary | ICD-10-CM

## 2012-09-27 LAB — CBC WITH DIFFERENTIAL (CANCER CENTER ONLY)
BASO%: 0.6 % (ref 0.0–2.0)
EOS%: 4.2 % (ref 0.0–7.0)
HGB: 13.9 g/dL (ref 11.6–15.9)
LYMPH#: 7.2 10*3/uL — ABNORMAL HIGH (ref 0.9–3.3)
MCH: 29.3 pg (ref 26.0–34.0)
MCHC: 33 g/dL (ref 32.0–36.0)
MCV: 89 fL (ref 81–101)
MONO#: 1 10*3/uL — ABNORMAL HIGH (ref 0.1–0.9)
NEUT%: 37.3 % — ABNORMAL LOW (ref 39.6–80.0)
RBC: 4.75 10*6/uL (ref 3.70–5.32)

## 2012-09-27 LAB — CHCC SATELLITE - SMEAR

## 2012-09-27 NOTE — Progress Notes (Signed)
This office note has been dictated.

## 2012-09-28 NOTE — Progress Notes (Signed)
DIAGNOSES: 1. IgG kappa monoclonal gammopathy of undetermined significance. 2. Neuropathy, multifactorial.  CURRENT THERAPY:  Observation.  INTERIM HISTORY:  Ms. Carrasco comes in for followup.  We last saw her back in November.  Unfortunately she is very emotional today.  Her son killed himself.  This actually happened in October.  He was very depressed.  He was seeing a Veterinary surgeon.  Unfortunately it did not seem like anything had helped him.  Ms. Chastain is certainly upset over this.  I can understand this. Thankfully, she and her family are undergoing grief counseling with hospice.  Otherwise, she has been about the same.  The neuropathy has not changed. In fact, when we saw her in November her M spike was 0.8 g/dL.  IgG level was 1760 mg/dL.  Kappa light chain was 2.48 mg/dL.  There have been no other changes with her.  Her blood sugars have been doing okay.  She still has these sweats.  There are no changes in bowel or bladder habits.  She has had no rashes.  PHYSICAL EXAMINATION:  General:  This is a fairly well-developed, well- nourished white female in no obvious distress.  Vital signs:  Show temperature of 97.4, pulse 76, respiratory rate 18, blood pressure 132/64.  Weight is 211.  Head and neck:  Shows a normocephalic, atraumatic skull.  There are no ocular or oral lesions.  There are no palpable cervical or supraclavicular lymph nodes.  Lungs:  Clear bilaterally.  Cardiac:  Regular rate and rhythm with a normal S1 and S2. There are no murmurs, rubs or bruits.  Abdomen:  Soft with good bowel sounds.  There is no palpable abdominal mass.  There is no fluid wave. There is no palpable hepatosplenomegaly.  Back:  No tenderness over the spine, ribs or hips.  Extremities:  Show no clubbing, cyanosis or edema. She does have some tenderness to palpation in the lower legs and feet.  LABORATORY STUDIES:  White cell count is 14.2, hemoglobin 13.9, hematocrit 42.1, platelet count  390.  IMPRESSION:  Ms. Mchaney is a 62 year old female with monoclonal gammopathy.  We have been following her for this for several years. Really, there has not been a lot of change with this.  I am still not sure that this is an issue for [name].  The monoclonal spike has gone up slowly over the year or so.  We will plan to get her back in 6 months' time.  I think this is a reasonable time to follow her up.  If the monoclonal spike is excessive at that point, then we may need to consider a bone marrow biopsy on her. I think we last did a bone marrow biopsy on her back in 2008.  At that point in time, the report did not show any obvious plasma cell disorder.    ______________________________ Josph Macho, M.D. PRE/MEDQ  D:  09/27/2012  T:  09/28/2012  Job:  [id number]

## 2012-10-01 LAB — PROTEIN ELECTROPHORESIS, SERUM, WITH REFLEX
Beta Globulin: 8 % — ABNORMAL HIGH (ref 4.7–7.2)
Gamma Globulin: 17.5 % (ref 11.1–18.8)
M-Spike, %: 0.68 g/dL
Total Protein, Serum Electrophoresis: 7.5 g/dL (ref 6.0–8.3)

## 2012-10-01 LAB — COMPREHENSIVE METABOLIC PANEL
ALT: 18 U/L (ref 0–35)
AST: 22 U/L (ref 0–37)
Albumin: 4.2 g/dL (ref 3.5–5.2)
Alkaline Phosphatase: 60 U/L (ref 39–117)
Calcium: 9.9 mg/dL (ref 8.4–10.5)
Chloride: 100 mEq/L (ref 96–112)
Potassium: 3.7 mEq/L (ref 3.5–5.3)
Sodium: 140 mEq/L (ref 135–145)
Total Protein: 7.5 g/dL (ref 6.0–8.3)

## 2012-10-01 LAB — IFE INTERPRETATION

## 2012-10-01 LAB — IGG, IGA, IGM: IgM, Serum: 81 mg/dL (ref 52–322)

## 2012-10-01 LAB — KAPPA/LAMBDA LIGHT CHAINS: Lambda Free Lght Chn: 2.67 mg/dL — ABNORMAL HIGH (ref 0.57–2.63)

## 2013-03-28 ENCOUNTER — Telehealth: Payer: Self-pay | Admitting: Hematology & Oncology

## 2013-03-28 ENCOUNTER — Other Ambulatory Visit: Payer: Medicare Other | Admitting: Lab

## 2013-03-28 ENCOUNTER — Ambulatory Visit: Payer: Medicare Other | Admitting: Hematology & Oncology

## 2013-03-28 NOTE — Telephone Encounter (Signed)
Pt called to cancel appt, due to being sick today. Will cb to reschedule at a later time.

## 2013-04-10 ENCOUNTER — Telehealth: Payer: Self-pay | Admitting: Hematology & Oncology

## 2013-04-10 NOTE — Telephone Encounter (Signed)
Pt made 8-13 for missed July appointment

## 2013-04-24 ENCOUNTER — Ambulatory Visit: Payer: Medicare Other | Admitting: Hematology & Oncology

## 2013-04-24 ENCOUNTER — Telehealth: Payer: Self-pay | Admitting: Hematology & Oncology

## 2013-04-24 ENCOUNTER — Other Ambulatory Visit: Payer: Medicare Other | Admitting: Lab

## 2013-04-24 NOTE — Telephone Encounter (Signed)
Pt said she was sick and moved appointment to 8-14

## 2013-04-25 ENCOUNTER — Ambulatory Visit (HOSPITAL_BASED_OUTPATIENT_CLINIC_OR_DEPARTMENT_OTHER): Payer: 59 | Admitting: Lab

## 2013-04-25 ENCOUNTER — Ambulatory Visit (HOSPITAL_BASED_OUTPATIENT_CLINIC_OR_DEPARTMENT_OTHER): Payer: 59 | Admitting: Hematology & Oncology

## 2013-04-25 ENCOUNTER — Telehealth: Payer: Self-pay | Admitting: Hematology & Oncology

## 2013-04-25 VITALS — BP 130/60 | HR 83 | Temp 98.0°F | Resp 18 | Wt 217.0 lb

## 2013-04-25 DIAGNOSIS — G589 Mononeuropathy, unspecified: Secondary | ICD-10-CM

## 2013-04-25 DIAGNOSIS — R1012 Left upper quadrant pain: Secondary | ICD-10-CM

## 2013-04-25 DIAGNOSIS — D472 Monoclonal gammopathy: Secondary | ICD-10-CM

## 2013-04-25 DIAGNOSIS — R0600 Dyspnea, unspecified: Secondary | ICD-10-CM

## 2013-04-25 LAB — CBC WITH DIFFERENTIAL (CANCER CENTER ONLY)
BASO#: 0.1 10*3/uL (ref 0.0–0.2)
BASO%: 0.5 % (ref 0.0–2.0)
HCT: 41 % (ref 34.8–46.6)
HGB: 13.5 g/dL (ref 11.6–15.9)
LYMPH#: 6 10*3/uL — ABNORMAL HIGH (ref 0.9–3.3)
MONO#: 1 10*3/uL — ABNORMAL HIGH (ref 0.1–0.9)
NEUT%: 42.3 % (ref 39.6–80.0)
RDW: 12.9 % (ref 11.1–15.7)
WBC: 13.1 10*3/uL — ABNORMAL HIGH (ref 3.9–10.0)

## 2013-04-25 NOTE — Telephone Encounter (Signed)
Pt aware of 8-20 PFT at 1030 am and 12 pm 2D Echo at [facility]

## 2013-04-25 NOTE — Progress Notes (Signed)
This office note has been dictated.

## 2013-04-26 NOTE — Progress Notes (Signed)
DIAGNOSES: 1. IgG Kappa MGUS. 2. Chronic neuropathy.  CURRENT THERAPY:  Observation.  INTERIM HISTORY:  [name] comes in for followup.  We last saw her back in January.  Her son, unfortunately, had killed himself back in October.  She seems to be doing a little bit better with this.  She still has the neuropathy but when I saw her, her monoclonal studies showed an M spike to be 0.68 g/dL.  IgG level was 1690 mg/dL.  Kappa light chain was 2.12 mg/dL.  Again, she has this neuropathy.  I believe this is more so from her diabetes than anything else.  She has had no change in bowel or bladder habits.  She has had no cough. There has been no weight loss.  PHYSICAL EXAMINATION:  General:  This is a moderately obese white female in no obvious distress.  Vital Signs:  Temperature of 98, pulse 83, respiratory rate 18, blood pressure 130/60.  Weight is 217.  Head/Neck: Exam shows a normocephalic and atraumatic skull.  There are no ocular or oral lesions.  There are no palpable cervical or supraclavicular lymph nodes.  Lungs:  Clear bilaterally.  Cardiac:  Regular rate and rhythm with a normal S1 and S2.  There are no murmurs, rubs, or bruits. Abdomen:  The abdomen is soft.  She has tenderness in the left upper quadrant.  There is a possible spleen tip at the left costal margin. There is no palpable hepatomegaly.  Extremities:  Show no clubbing, cyanosis, or edema.  Skin:  Exam shows no rashes.  Neurological:  Exam shows no focal neurological deficits.  LABORATORY:  White cell count 13, hemoglobin 13.5, hematocrit 41, and platelet count 332.  IMPRESSION:  [name] is a nice 62 year old female with IgG Kappa MGUS.  She has had this for several years.  So far, this has been holding relatively stable.  I am not sure why she has this left upper quadrant tenderness.  I will get her set up with an ultrasound.  Overall, I just do not see any obvious hematologic issue that we have  to treat.  We will continue to follow her at 61-month intervals.   ______________________________ Josph Macho, M.D. PRE/MEDQ  D:  04/25/2013  T:  04/26/2013  Job:  [id number]

## 2013-04-29 ENCOUNTER — Ambulatory Visit (HOSPITAL_BASED_OUTPATIENT_CLINIC_OR_DEPARTMENT_OTHER)
Admission: RE | Admit: 2013-04-29 | Discharge: 2013-04-29 | Disposition: A | Discharge status: Home / Self Care | Payer: 59 | Source: Ambulatory Visit | Attending: Hematology & Oncology | Admitting: Hematology & Oncology

## 2013-04-29 ENCOUNTER — Other Ambulatory Visit (HOSPITAL_BASED_OUTPATIENT_CLINIC_OR_DEPARTMENT_OTHER): Payer: Self-pay | Admitting: Family Medicine

## 2013-04-29 ENCOUNTER — Ambulatory Visit (HOSPITAL_BASED_OUTPATIENT_CLINIC_OR_DEPARTMENT_OTHER)
Admission: RE | Admit: 2013-04-29 | Discharge: 2013-04-29 | Disposition: A | Discharge status: Home / Self Care | Payer: 59 | Source: Ambulatory Visit | Attending: Family Medicine | Admitting: Family Medicine

## 2013-04-29 DIAGNOSIS — Z1231 Encounter for screening mammogram for malignant neoplasm of breast: Secondary | ICD-10-CM | POA: Insufficient documentation

## 2013-04-29 DIAGNOSIS — R0600 Dyspnea, unspecified: Secondary | ICD-10-CM

## 2013-04-29 DIAGNOSIS — R1012 Left upper quadrant pain: Secondary | ICD-10-CM

## 2013-04-29 DIAGNOSIS — K7689 Other specified diseases of liver: Secondary | ICD-10-CM | POA: Insufficient documentation

## 2013-04-29 LAB — IGG, IGA, IGM
IgA: 126 mg/dL (ref 69–380)
IgG (Immunoglobin G), Serum: 1460 mg/dL (ref 690–1700)
IgM, Serum: 79 mg/dL (ref 52–322)

## 2013-04-29 LAB — PROTEIN ELECTROPHORESIS, SERUM, WITH REFLEX
Albumin ELP: 53.5 % — ABNORMAL LOW (ref 55.8–66.1)
Alpha-1-Globulin: 5.9 % — ABNORMAL HIGH (ref 2.9–4.9)

## 2013-05-01 ENCOUNTER — Ambulatory Visit (HOSPITAL_COMMUNITY)
Admission: RE | Admit: 2013-05-01 | Discharge: 2013-05-01 | Disposition: A | Discharge status: Home / Self Care | Payer: 59 | Source: Ambulatory Visit | Attending: Hematology & Oncology | Admitting: Hematology & Oncology

## 2013-05-01 DIAGNOSIS — E119 Type 2 diabetes mellitus without complications: Secondary | ICD-10-CM | POA: Insufficient documentation

## 2013-05-01 DIAGNOSIS — I517 Cardiomegaly: Secondary | ICD-10-CM | POA: Insufficient documentation

## 2013-05-01 DIAGNOSIS — R0609 Other forms of dyspnea: Secondary | ICD-10-CM | POA: Insufficient documentation

## 2013-05-01 DIAGNOSIS — R1012 Left upper quadrant pain: Secondary | ICD-10-CM

## 2013-05-01 DIAGNOSIS — R0989 Other specified symptoms and signs involving the circulatory and respiratory systems: Secondary | ICD-10-CM | POA: Insufficient documentation

## 2013-05-01 DIAGNOSIS — R0600 Dyspnea, unspecified: Secondary | ICD-10-CM

## 2013-05-01 DIAGNOSIS — I059 Rheumatic mitral valve disease, unspecified: Secondary | ICD-10-CM | POA: Insufficient documentation

## 2013-05-01 DIAGNOSIS — I10 Essential (primary) hypertension: Secondary | ICD-10-CM | POA: Insufficient documentation

## 2013-05-01 DIAGNOSIS — Z79899 Other long term (current) drug therapy: Secondary | ICD-10-CM | POA: Insufficient documentation

## 2013-05-01 DIAGNOSIS — Z8249 Family history of ischemic heart disease and other diseases of the circulatory system: Secondary | ICD-10-CM | POA: Insufficient documentation

## 2013-05-01 DIAGNOSIS — E785 Hyperlipidemia, unspecified: Secondary | ICD-10-CM | POA: Insufficient documentation

## 2013-05-01 MED ORDER — ALBUTEROL SULFATE (5 MG/ML) 0.5% IN NEBU
2.5000 mg | INHALATION_SOLUTION | Freq: Once | RESPIRATORY_TRACT | Status: AC
  Administered 2013-05-01: 2.5 mg via RESPIRATORY_TRACT

## 2013-05-01 NOTE — Progress Notes (Signed)
Spoke to patient regarding above results. Verbalized understanding and knows to call if there are any additional questions or concerns.  Gabbie Marzo, Idell Pickles, RN

## 2013-05-01 NOTE — Progress Notes (Signed)
  Echocardiogram 2D Echocardiogram has been performed.  Cathie Beams 05/01/2013, 12:36 PM

## 2013-05-03 ENCOUNTER — Telehealth: Payer: Self-pay | Admitting: *Deleted

## 2013-05-03 NOTE — Telephone Encounter (Signed)
Called patient to let her know that her labwork looked good as well as her [name] of her abdomen and echocardiogram. All test results looked good per dr. Myna Hidalgo

## 2013-05-03 NOTE — Telephone Encounter (Signed)
Message copied by Anselm Jungling on Fri May 03, 2013 11:57 AM ------      Message from: Arlan Organ R      Created: Thu May 02, 2013  9:05 PM       Call - echocardiogram is ok.  Heart is working well.  Pete ------

## 2013-05-15 ENCOUNTER — Encounter: Payer: Self-pay | Admitting: Obstetrics and Gynecology

## 2013-06-03 ENCOUNTER — Encounter: Payer: Self-pay | Admitting: Gynecology

## 2013-07-01 ENCOUNTER — Encounter: Payer: Self-pay | Admitting: Gynecology

## 2013-10-23 ENCOUNTER — Other Ambulatory Visit: Payer: Self-pay | Admitting: *Deleted

## 2013-10-23 DIAGNOSIS — D472 Monoclonal gammopathy: Secondary | ICD-10-CM

## 2013-10-24 ENCOUNTER — Telehealth: Payer: Self-pay | Admitting: Hematology & Oncology

## 2013-10-24 ENCOUNTER — Other Ambulatory Visit: Payer: 59 | Admitting: Lab

## 2013-10-24 ENCOUNTER — Ambulatory Visit: Payer: 59 | Admitting: Hematology & Oncology

## 2013-10-24 NOTE — Telephone Encounter (Signed)
PT MOVED 2-12 TO 2-18

## 2013-10-30 ENCOUNTER — Telehealth: Payer: Self-pay | Admitting: Hematology & Oncology

## 2013-10-30 ENCOUNTER — Ambulatory Visit: Payer: 59 | Admitting: Hematology & Oncology

## 2013-10-30 ENCOUNTER — Other Ambulatory Visit: Payer: 59 | Admitting: Lab

## 2013-10-30 NOTE — Telephone Encounter (Signed)
Pt aware of 3-23 appointment I ofered her others sooner but in the morning and she didn't want that.

## 2013-11-19 ENCOUNTER — Other Ambulatory Visit: Payer: Self-pay | Admitting: Orthopaedic Surgery

## 2013-11-19 DIAGNOSIS — M5134 Other intervertebral disc degeneration, thoracic region: Secondary | ICD-10-CM

## 2013-11-28 ENCOUNTER — Inpatient Hospital Stay
Admission: RE | Admit: 2013-11-28 | Discharge: 2013-11-28 | Disposition: A | Discharge status: Home / Self Care | Payer: 59 | Source: Ambulatory Visit | Attending: Orthopaedic Surgery | Admitting: Orthopaedic Surgery

## 2013-11-28 ENCOUNTER — Other Ambulatory Visit: Payer: 59

## 2013-11-28 NOTE — Discharge Instructions (Signed)
Myelogram Discharge Instructions  1. Go home and rest quietly for the next 24 hours.  It is important to lie flat for the next 24 hours.  Get up only to go to the restroom.  You may lie in the bed or on a couch on your back, your stomach, your left side or your right side.  You may have one pillow under your head.  You may have pillows between your knees while you are on your side or under your knees while you are on your back.  2. DO NOT drive today.  Recline the seat as far back as it will go, while still wearing your seat belt, on the way home.  3. You may get up to go to the bathroom as needed.  You may sit up for 10 minutes to eat.  You may resume your normal diet and medications unless otherwise indicated.  Drink lots of extra fluids today and tomorrow.  4. The incidence of headache, nausea, or vomiting is about 5% (one in 20 patients).  If you develop a headache, lie flat and drink plenty of fluids until the headache goes away.  Caffeinated beverages may be helpful.  If you develop severe nausea and vomiting or a headache that does not go away with flat bed rest, call [phone].  5. You may resume normal activities after your 24 hours of bed rest is over; however, do not exert yourself strongly or do any heavy lifting tomorrow. If when you get up you have a headache when standing, go back to bed and force fluids for another 24 hours.  6. Call your physician for a follow-up appointment.  The results of your myelogram will be sent directly to your physician by the following day.  7. If you have any questions or if complications develop after you arrive home, please call [phone].  Discharge instructions have been explained to the patient.  The patient, or the person responsible for the patient, fully understands these instructions.      May resume venlofaxine/effexor on November 29, 2013, after 1:00 pm.

## 2013-12-02 ENCOUNTER — Ambulatory Visit: Payer: 59 | Admitting: Hematology & Oncology

## 2013-12-02 ENCOUNTER — Telehealth: Payer: Self-pay | Admitting: Hematology & Oncology

## 2013-12-02 ENCOUNTER — Other Ambulatory Visit: Payer: 59 | Admitting: Lab

## 2013-12-02 NOTE — Telephone Encounter (Signed)
Patient called and cx 12/02/13 apt for today due to back issues.  She stated she will call back to resch

## 2013-12-05 ENCOUNTER — Telehealth: Payer: Self-pay | Admitting: Hematology & Oncology

## 2013-12-05 NOTE — Telephone Encounter (Signed)
Pt made 5-7 appointment had earlier appointment but she didn't want mornings

## 2014-01-16 ENCOUNTER — Encounter: Payer: Self-pay | Admitting: Hematology & Oncology

## 2014-01-16 ENCOUNTER — Other Ambulatory Visit (HOSPITAL_BASED_OUTPATIENT_CLINIC_OR_DEPARTMENT_OTHER): Payer: 59 | Admitting: Lab

## 2014-01-16 ENCOUNTER — Ambulatory Visit (HOSPITAL_BASED_OUTPATIENT_CLINIC_OR_DEPARTMENT_OTHER): Payer: 59 | Admitting: Hematology & Oncology

## 2014-01-16 VITALS — BP 135/62 | HR 86 | Temp 98.1°F | Resp 14 | Ht 66.0 in | Wt 217.0 lb

## 2014-01-16 DIAGNOSIS — J3489 Other specified disorders of nose and nasal sinuses: Secondary | ICD-10-CM

## 2014-01-16 DIAGNOSIS — D472 Monoclonal gammopathy: Secondary | ICD-10-CM

## 2014-01-16 DIAGNOSIS — G609 Hereditary and idiopathic neuropathy, unspecified: Secondary | ICD-10-CM

## 2014-01-16 DIAGNOSIS — G63 Polyneuropathy in diseases classified elsewhere: Secondary | ICD-10-CM

## 2014-01-16 DIAGNOSIS — R059 Cough, unspecified: Secondary | ICD-10-CM

## 2014-01-16 DIAGNOSIS — R05 Cough: Secondary | ICD-10-CM

## 2014-01-16 DIAGNOSIS — R61 Generalized hyperhidrosis: Secondary | ICD-10-CM

## 2014-01-16 LAB — CBC WITH DIFFERENTIAL (CANCER CENTER ONLY)
BASO#: 0.1 10*3/uL (ref 0.0–0.2)
BASO%: 0.5 % (ref 0.0–2.0)
EOS%: 4.6 % (ref 0.0–7.0)
Eosinophils Absolute: 0.6 10*3/uL — ABNORMAL HIGH (ref 0.0–0.5)
HCT: 40.7 % (ref 34.8–46.6)
HGB: 13.5 g/dL (ref 11.6–15.9)
LYMPH#: 4.5 10*3/uL — ABNORMAL HIGH (ref 0.9–3.3)
LYMPH%: 33.7 % (ref 14.0–48.0)
MCH: 29.6 pg (ref 26.0–34.0)
MCHC: 33.2 g/dL (ref 32.0–36.0)
MCV: 89 fL (ref 81–101)
MONO#: 1 10*3/uL — ABNORMAL HIGH (ref 0.1–0.9)
MONO%: 7.1 % (ref 0.0–13.0)
NEUT#: 7.3 10*3/uL — ABNORMAL HIGH (ref 1.5–6.5)
NEUT%: 54.1 % (ref 39.6–80.0)
PLATELETS: 311 10*3/uL (ref 145–400)
RBC: 4.56 10*6/uL (ref 3.70–5.32)
RDW: 12.8 % (ref 11.1–15.7)
WBC: 13.5 10*3/uL — ABNORMAL HIGH (ref 3.9–10.0)

## 2014-01-16 MED ORDER — CEFDINIR 300 MG PO CAPS: 300.0000 mg | ORAL_CAPSULE | Freq: Two times a day (BID) | ORAL | Status: DC

## 2014-01-17 ENCOUNTER — Telehealth: Payer: Self-pay | Admitting: Hematology & Oncology

## 2014-01-17 NOTE — Progress Notes (Signed)
Hematology and Oncology Follow Up Visit  [name] [id number] 1950/11/18 63 y.o. 01/17/2014   Principle Diagnosis:  IgG Kappa MGUS. 2. Chronic neuropathy.  Current Therapy:   Observation.     Interim History:  Ms.  Dean is back for followup. We last saw her back in August of last year.  Her monoclonal spike has been stable. When we last saw her it was 0.7 g/dL. Her IgG level was 1460 mg/dL. Kappa Light chain was 2.59 mg/dL.  The neuropathy continues to be a problem.  We last saw her, she had an echocardiogram done which showed an ejection fraction of 60-65%. She also had poor function studies done which for some reason the results are not back. We will saw not to get this.  She still has diaphoresis. She still has cough. She has a very hoarse voice today. Today she is sinus drainage. I went ahead and gave her some antibiotics for this.  She's had no diarrhea. She's had no dysuria.      Medications: Current outpatient prescriptions:BENICAR HCT 20-12.5 MG per tablet, 1 tablet Daily. , Disp: , Rfl: ;  cyclobenzaprine (FLEXERIL) 10 MG tablet, Take 10 mg by mouth as needed for muscle spasms., Disp: , Rfl: ;  Fluticasone Propionate (FLONASE ALLERGY RELIEF NA), Place into the nose as needed., Disp: , Rfl: ;  HYDROcodone-ibuprofen (VICOPROFEN) 7.5-200 MG per tablet, 1 tablet every 8 (eight) hours as needed. , Disp: , Rfl:  L-Methylfolate-Algae-B12-B6 (METANX) 3-90.314-2-35 MG CAPS, Take by mouth every morning. , Disp: , Rfl: ;  metFORMIN (GLUCOPHAGE-XR) 500 MG 24 hr tablet, Take 1,000 mg by mouth Twice daily. Total of 2,000 mg, Disp: , Rfl: ;  metoprolol (TOPROL-XL) 100 MG 24 hr tablet, Take 100 mg by mouth daily.  , Disp: , Rfl: ;  morphine (MS CONTIN) 60 MG 12 hr tablet, 60 mg 2 (two) times daily. , Disp: , Rfl:  rosuvastatin (CRESTOR) 10 MG tablet, Take 10 mg by mouth daily.  , Disp: , Rfl: ;  venlafaxine XR (EFFEXOR-XR) 150 MG 24 hr capsule, Take 150 mg by mouth daily with  breakfast., Disp: , Rfl: ;  cefdinir (OMNICEF) 300 MG capsule, Take 1 capsule (300 mg total) by mouth 2 (two) times daily., Disp: 20 capsule, Rfl: 0  Allergies: No Known Allergies  Past Medical History, Surgical history, Social history, and Family History were reviewed and updated.  Review of Systems: As above  Physical Exam:  height is 5\' 6"  (1.676 m) and weight is 217 lb (98.431 kg). Her oral temperature is 98.1 F (36.7 C). Her blood pressure is 135/62 and her pulse is 86. Her respiration is 14.   Well-developed and well-nourished white female. Head and neck exam shows some tenderness over the maxillary sinuses. No oral lesions are noted. No adenopathy noted in the neck. Lungs are clear. Cardiac exam regular in rhythm. Abdomen is soft. She good bowel sounds. There is no fluid wave. There is no palpable hepatosplenomegaly. Her exam shows a laminectomy scar in the lumbar spine. No tenderness over the spine ribs or hips. Extremities shows some trace edema in her legs. Has good strength in her legs. She has good range of motion of her joints. Neurological exam shows some increased sensation to touch and it feet and lower legs. Skin exam no rashes.  Lab Results  Component Value Date   WBC 13.5* 01/16/2014   HGB 13.5 01/16/2014   HCT 40.7 01/16/2014   MCV 89 01/16/2014   PLT 311  01/16/2014     Chemistry      Component Value Date/Time   NA 138 01/16/2014 1439   K 3.4* 01/16/2014 1439   CL 97 01/16/2014 1439   CO2 30 01/16/2014 1439   BUN 10 01/16/2014 1439   CREATININE 0.73 01/16/2014 1439      Component Value Date/Time   CALCIUM 8.9 01/16/2014 1439   ALKPHOS 58 01/16/2014 1439   AST 36 01/16/2014 1439   ALT 27 01/16/2014 1439   BILITOT 0.9 01/16/2014 1439         Impression and Plan: [name] is 63 year old white female with diabetes. Her blood sugars are still quite high. I still think the neuropathy is from her diabetes and for her back surgery. She does have a stimulator in place. This does help a  little bit.  I don't see any indication for any pulmonary test. I don't see any indication for additional studies.  I need to figure out why her pulmonary function studies are not back.  we probably will get her back in 6 more months.   I spent a good half hour with her. I was trying to help her with this sinus infection so she would not have to go see another doctor.  [name], MD 5/8/20156:56 AM

## 2014-01-17 NOTE — Telephone Encounter (Signed)
Pt aware of 1-15 appointment °

## 2014-01-20 LAB — COMPREHENSIVE METABOLIC PANEL
ALT: 27 U/L (ref 0–35)
AST: 36 U/L (ref 0–37)
Albumin: 4.1 g/dL (ref 3.5–5.2)
Alkaline Phosphatase: 58 U/L (ref 39–117)
BILIRUBIN TOTAL: 0.9 mg/dL (ref 0.2–1.2)
BUN: 10 mg/dL (ref 6–23)
CO2: 30 mEq/L (ref 19–32)
Calcium: 8.9 mg/dL (ref 8.4–10.5)
Chloride: 97 mEq/L (ref 96–112)
Creatinine, Ser: 0.73 mg/dL (ref 0.50–1.10)
Glucose, Bld: 187 mg/dL — ABNORMAL HIGH (ref 70–99)
Potassium: 3.4 mEq/L — ABNORMAL LOW (ref 3.5–5.3)
SODIUM: 138 meq/L (ref 135–145)
TOTAL PROTEIN: 7 g/dL (ref 6.0–8.3)

## 2014-01-20 LAB — KAPPA/LAMBDA LIGHT CHAINS
Kappa free light chain: 1.79 mg/dL (ref 0.33–1.94)
Kappa:Lambda Ratio: 0.69 (ref 0.26–1.65)
LAMBDA FREE LGHT CHN: 2.58 mg/dL (ref 0.57–2.63)

## 2014-01-20 LAB — IMMUNOFIXATION ELECTROPHORESIS
IGG (IMMUNOGLOBIN G), SERUM: 1650 mg/dL (ref 690–1700)
IgA: 111 mg/dL (ref 69–380)
IgM, Serum: 67 mg/dL (ref 52–322)
Total Protein, Serum Electrophoresis: 7 g/dL (ref 6.0–8.3)

## 2014-01-23 ENCOUNTER — Telehealth: Payer: Self-pay | Admitting: *Deleted

## 2014-01-23 NOTE — Telephone Encounter (Signed)
Called patient to let her know that her PFT s were normal .  Left message on patients personal answering machine

## 2014-01-27 ENCOUNTER — Encounter: Payer: Self-pay | Admitting: Gynecology

## 2014-02-12 ENCOUNTER — Encounter: Payer: Self-pay | Admitting: Gynecology

## 2014-03-06 ENCOUNTER — Telehealth: Payer: Self-pay | Admitting: Gynecology

## 2014-03-06 NOTE — Telephone Encounter (Signed)
Left msg regarding appointment canceled with Dr.Lathrop 03/10/14 to call and reschedule.

## 2014-03-10 ENCOUNTER — Encounter: Payer: Self-pay | Admitting: Gynecology

## 2014-03-10 NOTE — Telephone Encounter (Signed)
Pt called i rescheduled her to 04/02/14 at 3:00 pm

## 2014-04-02 ENCOUNTER — Encounter: Payer: Self-pay | Admitting: Gynecology

## 2014-05-07 ENCOUNTER — Telehealth: Payer: Self-pay | Admitting: Gynecology

## 2014-05-07 ENCOUNTER — Encounter: Payer: Self-pay | Admitting: Gynecology

## 2014-05-07 NOTE — Telephone Encounter (Signed)
Pt wanted to apologize for having to reschedule but woke up with a migraine and is sick.

## 2014-05-20 ENCOUNTER — Encounter: Payer: Self-pay | Admitting: Gynecology

## 2014-05-26 ENCOUNTER — Other Ambulatory Visit (HOSPITAL_COMMUNITY)
Admission: RE | Admit: 2014-05-26 | Discharge: 2014-05-26 | Disposition: A | Discharge status: Home / Self Care | Payer: BC Managed Care – PPO | Source: Ambulatory Visit | Attending: Obstetrics and Gynecology | Admitting: Obstetrics and Gynecology

## 2014-05-26 ENCOUNTER — Other Ambulatory Visit: Payer: Self-pay | Admitting: Obstetrics and Gynecology

## 2014-05-26 DIAGNOSIS — R87810 Cervical high risk human papillomavirus (HPV) DNA test positive: Secondary | ICD-10-CM | POA: Diagnosis present

## 2014-05-26 DIAGNOSIS — Z01419 Encounter for gynecological examination (general) (routine) without abnormal findings: Secondary | ICD-10-CM | POA: Diagnosis present

## 2014-05-26 DIAGNOSIS — Z1151 Encounter for screening for human papillomavirus (HPV): Secondary | ICD-10-CM | POA: Diagnosis present

## 2014-05-28 LAB — CYTOLOGY - PAP

## 2014-06-04 ENCOUNTER — Encounter: Payer: Self-pay | Admitting: Gynecology

## 2014-06-16 ENCOUNTER — Encounter: Payer: Self-pay | Admitting: Gynecology

## 2014-07-17 ENCOUNTER — Other Ambulatory Visit: Payer: 59 | Admitting: Lab

## 2014-07-17 ENCOUNTER — Ambulatory Visit: Payer: 59 | Admitting: Hematology & Oncology

## 2014-08-20 ENCOUNTER — Ambulatory Visit: Payer: 59 | Admitting: Family

## 2014-08-20 ENCOUNTER — Other Ambulatory Visit: Payer: 59 | Admitting: Lab

## 2014-08-22 ENCOUNTER — Ambulatory Visit (HOSPITAL_BASED_OUTPATIENT_CLINIC_OR_DEPARTMENT_OTHER): Payer: BC Managed Care – PPO

## 2014-08-22 ENCOUNTER — Ambulatory Visit (HOSPITAL_BASED_OUTPATIENT_CLINIC_OR_DEPARTMENT_OTHER): Payer: BC Managed Care – PPO | Admitting: Family

## 2014-08-22 ENCOUNTER — Ambulatory Visit (HOSPITAL_BASED_OUTPATIENT_CLINIC_OR_DEPARTMENT_OTHER): Payer: BC Managed Care – PPO | Admitting: Lab

## 2014-08-22 VITALS — BP 125/73 | HR 78 | Temp 98.3°F | Resp 16 | Wt 208.0 lb

## 2014-08-22 DIAGNOSIS — D472 Monoclonal gammopathy: Secondary | ICD-10-CM

## 2014-08-22 DIAGNOSIS — G609 Hereditary and idiopathic neuropathy, unspecified: Secondary | ICD-10-CM

## 2014-08-22 DIAGNOSIS — G63 Polyneuropathy in diseases classified elsewhere: Secondary | ICD-10-CM

## 2014-08-22 DIAGNOSIS — E119 Type 2 diabetes mellitus without complications: Secondary | ICD-10-CM

## 2014-08-22 DIAGNOSIS — M549 Dorsalgia, unspecified: Secondary | ICD-10-CM

## 2014-08-22 DIAGNOSIS — Z23 Encounter for immunization: Secondary | ICD-10-CM

## 2014-08-22 DIAGNOSIS — J3489 Other specified disorders of nose and nasal sinuses: Secondary | ICD-10-CM

## 2014-08-22 LAB — CMP (CANCER CENTER ONLY)
ALBUMIN: 3.5 g/dL (ref 3.3–5.5)
ALT(SGPT): 28 U/L (ref 10–47)
AST: 31 U/L (ref 11–38)
Alkaline Phosphatase: 48 U/L (ref 26–84)
BUN: 11 mg/dL (ref 7–22)
CALCIUM: 8.9 mg/dL (ref 8.0–10.3)
CHLORIDE: 97 meq/L — AB (ref 98–108)
CO2: 31 mEq/L (ref 18–33)
Creat: 0.8 mg/dl (ref 0.6–1.2)
GLUCOSE: 121 mg/dL — AB (ref 73–118)
POTASSIUM: 3.2 meq/L — AB (ref 3.3–4.7)
SODIUM: 140 meq/L (ref 128–145)
Total Bilirubin: 0.9 mg/dl (ref 0.20–1.60)
Total Protein: 7.4 g/dL (ref 6.4–8.1)

## 2014-08-22 LAB — CBC WITH DIFFERENTIAL (CANCER CENTER ONLY)
BASO#: 0.1 10*3/uL (ref 0.0–0.2)
BASO%: 0.6 % (ref 0.0–2.0)
EOS%: 5.3 % (ref 0.0–7.0)
Eosinophils Absolute: 0.6 10*3/uL — ABNORMAL HIGH (ref 0.0–0.5)
HCT: 38.2 % (ref 34.8–46.6)
HGB: 13.1 g/dL (ref 11.6–15.9)
LYMPH#: 5 10*3/uL — AB (ref 0.9–3.3)
LYMPH%: 44.8 % (ref 14.0–48.0)
MCH: 30 pg (ref 26.0–34.0)
MCHC: 34.3 g/dL (ref 32.0–36.0)
MCV: 88 fL (ref 81–101)
MONO#: 0.8 10*3/uL (ref 0.1–0.9)
MONO%: 7.3 % (ref 0.0–13.0)
NEUT#: 4.7 10*3/uL (ref 1.5–6.5)
NEUT%: 42 % (ref 39.6–80.0)
PLATELETS: 301 10*3/uL (ref 145–400)
RBC: 4.36 10*6/uL (ref 3.70–5.32)
RDW: 12.6 % (ref 11.1–15.7)
WBC: 11.2 10*3/uL — AB (ref 3.9–10.0)

## 2014-08-22 MED ORDER — INFLUENZA VAC SPLIT QUAD 0.5 ML IM SUSY
0.5000 mL | PREFILLED_SYRINGE | Freq: Once | INTRAMUSCULAR | Status: AC
  Administered 2014-08-22: 0.5 mL via INTRAMUSCULAR
  Filled 2014-08-22: qty 0.5

## 2014-08-22 NOTE — Progress Notes (Signed)
[place]  [phone] Fax:(336) [phone]  ID: [name] OB: [birth year] MR#: [id number] AJO#:[id number] Patient Care Team: Reginia Naas, MD as PCP - General (Family Medicine)  DIAGNOSIS: 1. IgG Kappa MGUS. 2. Chronic neuropathy  INTERVAL HISTORY: [name] is here today for a follow-up. She is feeling ok. She still has problems with her back and neuropathy. She doesn't feel like her back stimulator is helping very much.  She also has been having problems with her sinuses and has a sinus headache sometimes with it.  She states that she has her diabetes under better control.  Her last ECHO in 2014 showed an EF of 60-65%.  She is still having a lot of diaphoresis. Her neurologist feels like this is from nerve damage.  She denies fever, chills, n/v, cough, rash, dizziness, SOB, chest pain, palpitations, constipation, diarrhea, blood in urine or stool. She has slow gastric emptying and sometimes has abdominal pain with this.  No swelling or tenderness in her extremities.  Her appetite is good and she is staying hydrated. She has lost 9 lbs since her last visit.  In August of last year her monoclonal spike was 0.7 g/dL IgG level was 1460 mg/dL. In February her kappa light chain was 0.69 mg/dL.  CURRENT TREATMENT: Observation  REVIEW OF SYSTEMS: All other 10 point review of systems is negative.   PAST MEDICAL HISTORY: No past medical history on file.  PAST SURGICAL HISTORY: No past surgical history on file.  FAMILY HISTORY No family history on file.  GYNECOLOGIC HISTORY:  No LMP recorded. Patient is postmenopausal.   SOCIAL HISTORY: History   Social History  . Marital Status: Married    Spouse Name: N/A    Number of Children: N/A  . Years of Education: N/A   Occupational History  . Not on file.   Social History Main Topics  . Smoking status: Never Smoker   . Smokeless tobacco: Never Used     Comment: never used tobacco  .  Alcohol Use: Not on file  . Drug Use: Not on file  . Sexual Activity: Not on file   Other Topics Concern  . Not on file   Social History Narrative  . No narrative on file    ADVANCED DIRECTIVES:  <no information>  HEALTH MAINTENANCE: History  Substance Use Topics  . Smoking status: Never Smoker   . Smokeless tobacco: Never Used     Comment: never used tobacco  . Alcohol Use: Not on file   Colonoscopy: PAP: Bone density: Lipid panel:  No Known Allergies  Current Outpatient Prescriptions  Medication Sig Dispense Refill  . BENICAR HCT 20-12.5 MG per tablet 1 tablet Daily.     . cefdinir (OMNICEF) 300 MG capsule Take 1 capsule (300 mg total) by mouth 2 (two) times daily. 20 capsule 0  . cyclobenzaprine (FLEXERIL) 10 MG tablet Take 10 mg by mouth as needed for muscle spasms.    . Fluticasone Propionate (FLONASE ALLERGY RELIEF NA) Place into the nose as needed.    Marland Kitchen HYDROcodone-ibuprofen (VICOPROFEN) 7.5-200 MG per tablet 1 tablet every 8 (eight) hours as needed.     Marland Kitchen L-Methylfolate-Algae-B12-B6 (METANX) 3-90.314-2-35 MG CAPS Take by mouth every morning.     . metFORMIN (GLUCOPHAGE-XR) 500 MG 24 hr tablet Take 1,000 mg by mouth Twice daily. Total of 2,000 mg    . metoprolol (TOPROL-XL) 100 MG 24 hr tablet Take 100 mg by mouth daily.      Marland Kitchen  morphine (MS CONTIN) 60 MG 12 hr tablet 60 mg 2 (two) times daily.     . rosuvastatin (CRESTOR) 10 MG tablet Take 10 mg by mouth daily.      Marland Kitchen venlafaxine XR (EFFEXOR-XR) 150 MG 24 hr capsule Take 150 mg by mouth daily with breakfast.     No current facility-administered medications for this visit.    OBJECTIVE: There were no vitals filed for this visit. There were no vitals filed for this visit. ECOG FS:1 - Symptomatic but completely ambulatory Ocular: Sclerae unicteric, pupils equal, round and reactive to light Ear-nose-throat: Oropharynx clear, dentition fair Lymphatic: No cervical or supraclavicular adenopathy Lungs no rales or  rhonchi, good excursion bilaterally Heart regular rate and rhythm, no murmur appreciated Abd soft, nontender, positive bowel sounds MSK no focal spinal tenderness, no joint edema Neuro: non-focal, well-oriented, appropriate affect Breasts: Deferred  LAB RESULTS: CMP     Component Value Date/Time   NA 138 01/16/2014 1439   K 3.4* 01/16/2014 1439   CL 97 01/16/2014 1439   CO2 30 01/16/2014 1439   GLUCOSE 187* 01/16/2014 1439   BUN 10 01/16/2014 1439   CREATININE 0.73 01/16/2014 1439   CALCIUM 8.9 01/16/2014 1439   PROT 7.0 01/16/2014 1439   ALBUMIN 4.1 01/16/2014 1439   AST 36 01/16/2014 1439   ALT 27 01/16/2014 1439   ALKPHOS 58 01/16/2014 1439   BILITOT 0.9 01/16/2014 1439   GFRNONAA >60 01/27/2009 1430   GFRAA  01/27/2009 1430    >60        The eGFR has been calculated using the MDRD equation. This calculation has not been validated in all clinical situations. eGFR's persistently <60 mL/min signify possible Chronic Kidney Disease.   INo results found for: SPEP, UPEP Lab Results  Component Value Date   WBC 13.5* 01/16/2014   NEUTROABS 7.3* 01/16/2014   HGB 13.5 01/16/2014   HCT 40.7 01/16/2014   MCV 89 01/16/2014   PLT 311 01/16/2014   No results found for: LABCA2 No components found for: GURKY706 No results for input(s): INR in the last 168 hours. Urinalysis No results found for: COLORURINE, APPEARANCEUR, LABSPEC, PHURINE, GLUCOSEU, HGBUR, BILIRUBINUR, KETONESUR, PROTEINUR, UROBILINOGEN, NITRITE, LEUKOCYTESUR STUDIES: No results found.  ASSESSMENT/PLAN: [name] is 63 year old white female IgG Kappa MGUS and neuropathy due to diabetes and nerve damage. Her sinus issues have improved but she does occassionally get a sinus headache.  She has a lot of back pain that is somewhat relieved by a stimulator. She is going to try taking an OTC vitamin with B6 and 12 in it to help with her neuropathy.   We will see what her labs from today show.  We will see her  back in 6 months for labs and follow-up.  She knows to call here with any questions or concerns and to go to the ED in the event of an emergency. We can certainly see her sooner if need be.   Eliezer Bottom, NP 08/22/2014 11:07 AM

## 2014-08-26 LAB — PROTEIN ELECTROPHORESIS, SERUM, WITH REFLEX
ALPHA-1-GLOBULIN: 4.4 % (ref 2.9–4.9)
Albumin ELP: 56.8 % (ref 55.8–66.1)
Alpha-2-Globulin: 11.4 % (ref 7.1–11.8)
BETA 2: 4.6 % (ref 3.2–6.5)
Beta Globulin: 6.6 % (ref 4.7–7.2)
GAMMA GLOBULIN: 16.2 % (ref 11.1–18.8)
M-SPIKE, %: 0.61 g/dL
Total Protein, Serum Electrophoresis: 7.3 g/dL (ref 6.0–8.3)

## 2014-08-26 LAB — KAPPA/LAMBDA LIGHT CHAINS
KAPPA FREE LGHT CHN: 2.98 mg/dL — AB (ref 0.33–1.94)
KAPPA LAMBDA RATIO: 1.05 (ref 0.26–1.65)
LAMBDA FREE LGHT CHN: 2.84 mg/dL — AB (ref 0.57–2.63)

## 2014-08-26 LAB — IFE INTERPRETATION

## 2014-08-26 LAB — IGG, IGA, IGM
IGA: 95 mg/dL (ref 69–380)
IGG (IMMUNOGLOBIN G), SERUM: 1260 mg/dL (ref 690–1700)
IGM, SERUM: 63 mg/dL (ref 52–322)

## 2015-02-20 ENCOUNTER — Ambulatory Visit (HOSPITAL_BASED_OUTPATIENT_CLINIC_OR_DEPARTMENT_OTHER)
Admission: RE | Admit: 2015-02-20 | Discharge: 2015-02-20 | Disposition: A | Discharge status: Home / Self Care | Payer: BLUE CROSS/BLUE SHIELD | Source: Ambulatory Visit | Attending: Hematology & Oncology | Admitting: Hematology & Oncology

## 2015-02-20 ENCOUNTER — Ambulatory Visit (HOSPITAL_BASED_OUTPATIENT_CLINIC_OR_DEPARTMENT_OTHER): Payer: BLUE CROSS/BLUE SHIELD | Admitting: Hematology & Oncology

## 2015-02-20 ENCOUNTER — Encounter: Payer: Self-pay | Admitting: Family

## 2015-02-20 ENCOUNTER — Ambulatory Visit (HOSPITAL_BASED_OUTPATIENT_CLINIC_OR_DEPARTMENT_OTHER): Payer: BLUE CROSS/BLUE SHIELD

## 2015-02-20 VITALS — BP 123/62 | HR 83 | Temp 97.6°F | Resp 16 | Ht 66.0 in | Wt 208.0 lb

## 2015-02-20 DIAGNOSIS — D472 Monoclonal gammopathy: Secondary | ICD-10-CM

## 2015-02-20 DIAGNOSIS — R519 Headache, unspecified: Secondary | ICD-10-CM

## 2015-02-20 DIAGNOSIS — R51 Headache: Secondary | ICD-10-CM | POA: Insufficient documentation

## 2015-02-20 DIAGNOSIS — G609 Hereditary and idiopathic neuropathy, unspecified: Secondary | ICD-10-CM

## 2015-02-20 DIAGNOSIS — E119 Type 2 diabetes mellitus without complications: Secondary | ICD-10-CM

## 2015-02-20 LAB — CMP (CANCER CENTER ONLY)
ALK PHOS: 58 U/L (ref 26–84)
ALT: 28 U/L (ref 10–47)
AST: 30 U/L (ref 11–38)
Albumin: 3.8 g/dL (ref 3.3–5.5)
BUN, Bld: 14 mg/dL (ref 7–22)
CALCIUM: 9.5 mg/dL (ref 8.0–10.3)
CO2: 29 meq/L (ref 18–33)
Chloride: 94 mEq/L — ABNORMAL LOW (ref 98–108)
Creat: 1.2 mg/dl (ref 0.6–1.2)
GLUCOSE: 135 mg/dL — AB (ref 73–118)
POTASSIUM: 3.1 meq/L — AB (ref 3.3–4.7)
Sodium: 138 mEq/L (ref 128–145)
Total Bilirubin: 1 mg/dl (ref 0.20–1.60)
Total Protein: 7.8 g/dL (ref 6.4–8.1)

## 2015-02-20 LAB — CBC WITH DIFFERENTIAL (CANCER CENTER ONLY)
BASO#: 0.1 10*3/uL (ref 0.0–0.2)
BASO%: 0.9 % (ref 0.0–2.0)
EOS ABS: 0.7 10*3/uL — AB (ref 0.0–0.5)
EOS%: 4.4 % (ref 0.0–7.0)
HEMATOCRIT: 40.3 % (ref 34.8–46.6)
HGB: 13.9 g/dL (ref 11.6–15.9)
LYMPH#: 6.5 10*3/uL — AB (ref 0.9–3.3)
LYMPH%: 44.5 % (ref 14.0–48.0)
MCH: 30.6 pg (ref 26.0–34.0)
MCHC: 34.5 g/dL (ref 32.0–36.0)
MCV: 89 fL (ref 81–101)
MONO#: 1 10*3/uL — ABNORMAL HIGH (ref 0.1–0.9)
MONO%: 6.5 % (ref 0.0–13.0)
NEUT#: 6.4 10*3/uL (ref 1.5–6.5)
NEUT%: 43.7 % (ref 39.6–80.0)
Platelets: 375 10*3/uL (ref 145–400)
RBC: 4.54 10*6/uL (ref 3.70–5.32)
RDW: 12.6 % (ref 11.1–15.7)
WBC: 14.7 10*3/uL — ABNORMAL HIGH (ref 3.9–10.0)

## 2015-02-20 MED ORDER — VITAMIN B-6 250 MG PO TABS: 250.0000 mg | ORAL_TABLET | Freq: Every day | ORAL | Status: DC

## 2015-02-23 ENCOUNTER — Encounter: Payer: Self-pay | Admitting: *Deleted

## 2015-02-23 NOTE — Progress Notes (Signed)
Hematology and Oncology Follow Up Visit  [name] [id number] 1950/12/08 64 y.o. 02/23/2015   Principle Diagnosis:  IgG Kappa MGUS. 2. Chronic neuropathy.  Current Therapy:   Observation.     Interim History:  Ms.  Dean is back for followup. We last saw her back in December of last year.  Her main complaint has been pain behind the left ear. This seems to be in the cranium. I did go him get some x-rays of the cranium. There is no lytic lesions or other issues that could explain the discomfort.  Her monoclonal spike has been stable. When we last saw her it was 0. 61 g/dL. Her IgG level was 1260 mg/dL. Kappa Light chain was 2.98 mg/dL.  The neuropathy continues to be a problem. I really think this is coming from her back surgery. She also has diabetes. The MGUS in my mind has always been reactive. I know she has had a bone marrow biopsy in the past which was unremarkable.  We last saw her, she had an echocardiogram done which showed an ejection fraction of 60-65%. She also had pulmonary function studies done which for some reason the results are not back. We will saw not to get this.  She still has diaphoresis. She still has cough. .    She's had no diarrhea. She's had no dysuria.  Overall, her performance status is ECOG 1.    Medications:  Current outpatient prescriptions:  .  cyclobenzaprine (FLEXERIL) 10 MG tablet, Take 10 mg by mouth as needed for muscle spasms., Disp: , Rfl:  .  HYDROcodone-ibuprofen (VICOPROFEN) 7.5-200 MG per tablet, 1 tablet every 8 (eight) hours as needed. , Disp: , Rfl:  .  ibuprofen (ADVIL,MOTRIN) 800 MG tablet, , Disp: , Rfl: 2 .  metFORMIN (GLUCOPHAGE-XR) 500 MG 24 hr tablet, Take 1,000 mg by mouth Twice daily. Total of 2,000 mg, Disp: , Rfl:  .  metoprolol (TOPROL-XL) 100 MG 24 hr tablet, Take 100 mg by mouth daily.  , Disp: , Rfl:  .  morphine (MS CONTIN) 60 MG 12 hr tablet, , Disp: , Rfl: 0 .  nystatin (MYCOSTATIN/NYSTOP) 100000  UNIT/GM POWD, , Disp: , Rfl: 3 .  nystatin-triamcinolone (MYCOLOG II) cream, , Disp: , Rfl: 0 .  pravastatin (PRAVACHOL) 40 MG tablet, Take 40 mg by mouth daily., Disp: , Rfl: 1 .  prednisoLONE acetate (PRED FORTE) 1 % ophthalmic suspension, PLACE 1 DROP INTO AFFECTED EYE(S) 4 TIMES A DAY, Disp: , Rfl: 0 .  Pyridoxine HCl (VITAMIN B-6) 250 MG tablet, Take 1 tablet (250 mg total) by mouth daily., Disp: 30 tablet, Rfl: 12 .  valsartan-hydrochlorothiazide (DIOVAN-HCT) 160-12.5 MG per tablet, Take 1 tablet by mouth daily., Disp: , Rfl:  .  venlafaxine XR (EFFEXOR-XR) 150 MG 24 hr capsule, Take 150 mg by mouth daily with breakfast., Disp: , Rfl:   Allergies: No Known Allergies  Past Medical History, Surgical history, Social history, and Family History were reviewed and updated.  Review of Systems: As above  Physical Exam:  height is 5' 6"  (1.676 m) and weight is 208 lb (94.348 kg). Her oral temperature is 97.6 F (36.4 C). Her blood pressure is 123/62 and her pulse is 83. Her respiration is 16.   Well-developed and well-nourished white female. Head and neck exam shows some tenderness over the maxillary sinuses. No oral lesions are noted. No adenopathy noted in the neck. Lungs are clear. Cardiac exam regular in rhythm. Abdomen is soft. She good bowel  sounds. There is no fluid wave. There is no palpable hepatosplenomegaly. Her exam shows a laminectomy scar in the lumbar spine. No tenderness over the spine ribs or hips. Extremities shows some trace edema in her legs. Has good strength in her legs. She has good range of motion of her joints. Neurological exam shows some increased sensation to touch and it feet and lower legs. Skin exam no rashes.  Lab Results  Component Value Date   WBC 14.7* 02/20/2015   HGB 13.9 02/20/2015   HCT 40.3 02/20/2015   MCV 89 02/20/2015   PLT 375 02/20/2015     Chemistry      Component Value Date/Time   NA 138 02/20/2015 1010   NA 138 01/16/2014 1439   K 3.1*  02/20/2015 1010   K 3.4* 01/16/2014 1439   CL 94* 02/20/2015 1010   CL 97 01/16/2014 1439   CO2 29 02/20/2015 1010   CO2 30 01/16/2014 1439   BUN 14 02/20/2015 1010   BUN 10 01/16/2014 1439   CREATININE 1.2 02/20/2015 1010   CREATININE 0.73 01/16/2014 1439      Component Value Date/Time   CALCIUM 9.5 02/20/2015 1010   CALCIUM 8.9 01/16/2014 1439   ALKPHOS 58 02/20/2015 1010   ALKPHOS 58 01/16/2014 1439   AST 30 02/20/2015 1010   AST 36 01/16/2014 1439   ALT 28 02/20/2015 1010   ALT 27 01/16/2014 1439   BILITOT 1.00 02/20/2015 1010   BILITOT 0.9 01/16/2014 1439         Impression and Plan: [name] is 64 year old white female with diabetes. Her blood sugars are still quite high. I still think the neuropathy is from her diabetes and from her back surgery. She does have a stimulator in place. This really is not helping her. I think she'll have this removed. .  We probably will get her back in 6 more months.   I spent a good half hour with her.   [name], MD 6/13/20167:12 AM

## 2015-02-24 LAB — PROTEIN ELECTROPHORESIS, SERUM, WITH REFLEX
ABNORMAL PROTEIN BAND1: 0.7 g/dL
Albumin ELP: 4.3 g/dL (ref 3.8–4.8)
Alpha-1-Globulin: 0.3 g/dL (ref 0.2–0.3)
Alpha-2-Globulin: 0.9 g/dL (ref 0.5–0.9)
Beta 2: 0.3 g/dL (ref 0.2–0.5)
Beta Globulin: 0.5 g/dL (ref 0.4–0.6)
GAMMA GLOBULIN: 1.3 g/dL (ref 0.8–1.7)
TOTAL PROTEIN, SERUM ELECTROPHOR: 7.7 g/dL (ref 6.1–8.1)

## 2015-02-24 LAB — IGG, IGA, IGM
IGA: 100 mg/dL (ref 69–380)
IgG (Immunoglobin G), Serum: 1650 mg/dL (ref 690–1700)
IgM, Serum: 65 mg/dL (ref 52–322)

## 2015-02-24 LAB — IFE INTERPRETATION

## 2015-02-24 LAB — KAPPA/LAMBDA LIGHT CHAINS
Kappa free light chain: 3.15 mg/dL — ABNORMAL HIGH (ref 0.33–1.94)
Kappa:Lambda Ratio: 1.29 (ref 0.26–1.65)
Lambda Free Lght Chn: 2.45 mg/dL (ref 0.57–2.63)

## 2015-03-09 ENCOUNTER — Other Ambulatory Visit: Payer: Self-pay

## 2015-06-04 ENCOUNTER — Ambulatory Visit: Payer: BLUE CROSS/BLUE SHIELD | Admitting: Neurology

## 2015-08-24 ENCOUNTER — Other Ambulatory Visit: Payer: BLUE CROSS/BLUE SHIELD

## 2015-08-24 ENCOUNTER — Ambulatory Visit: Payer: BLUE CROSS/BLUE SHIELD | Admitting: Hematology & Oncology

## 2015-09-16 ENCOUNTER — Telehealth: Payer: Self-pay | Admitting: Hematology & Oncology

## 2015-09-16 ENCOUNTER — Ambulatory Visit: Payer: BLUE CROSS/BLUE SHIELD | Admitting: Neurology

## 2015-09-16 ENCOUNTER — Ambulatory Visit: Payer: BLUE CROSS/BLUE SHIELD | Admitting: Hematology & Oncology

## 2015-09-16 ENCOUNTER — Other Ambulatory Visit: Payer: BLUE CROSS/BLUE SHIELD

## 2015-09-16 NOTE — Telephone Encounter (Signed)
Patient call and cx 09/16/15 apt and resch for 10/12/15.  rn was notified of cx appt

## 2015-10-12 ENCOUNTER — Other Ambulatory Visit: Payer: BLUE CROSS/BLUE SHIELD

## 2015-10-12 ENCOUNTER — Ambulatory Visit: Payer: BLUE CROSS/BLUE SHIELD | Admitting: Hematology & Oncology

## 2015-10-13 ENCOUNTER — Other Ambulatory Visit: Payer: BLUE CROSS/BLUE SHIELD

## 2015-10-13 ENCOUNTER — Ambulatory Visit: Payer: BLUE CROSS/BLUE SHIELD | Admitting: Hematology & Oncology

## 2015-10-15 ENCOUNTER — Other Ambulatory Visit (INDEPENDENT_AMBULATORY_CARE_PROVIDER_SITE_OTHER): Payer: BLUE CROSS/BLUE SHIELD

## 2015-10-15 ENCOUNTER — Encounter: Payer: Self-pay | Admitting: Neurology

## 2015-10-15 ENCOUNTER — Ambulatory Visit (INDEPENDENT_AMBULATORY_CARE_PROVIDER_SITE_OTHER): Payer: BLUE CROSS/BLUE SHIELD | Admitting: Neurology

## 2015-10-15 VITALS — BP 110/76 | HR 106 | Ht 66.0 in | Wt 201.1 lb

## 2015-10-15 DIAGNOSIS — G43109 Migraine with aura, not intractable, without status migrainosus: Secondary | ICD-10-CM

## 2015-10-15 DIAGNOSIS — E0842 Diabetes mellitus due to underlying condition with diabetic polyneuropathy: Secondary | ICD-10-CM

## 2015-10-15 DIAGNOSIS — M545 Low back pain, unspecified: Secondary | ICD-10-CM

## 2015-10-15 LAB — VITAMIN B12: VITAMIN B 12: 349 pg/mL (ref 211–911)

## 2015-10-15 LAB — TSH: TSH: 2.82 u[IU]/mL (ref 0.35–4.50)

## 2015-10-15 NOTE — Progress Notes (Signed)
[facility] Clinic Note - Initial Visit   Date: 10/15/2015  [name] MRN: [id number] DOB: 1951/01/23   Dear Dr. Tamala Julian:  Thank you for your kind referral of [name] for consultation of neuropathy. Although her history is well known to you, please allow [name] to reiterate it for the purpose of our medical record. The patient was accompanied to the clinic by self.    History of Present Illness: [name] is a 65 y.o. right-handed Caucasian female with type 2 diabetes mellitus (HbA1c 6.5), s/p lumbar decompression and fusion at L3-S1, hypertension, hyperlipidemia, IgG kappa MGUS, chronic pain s/p spinal cord stimulator, and depression presenting for evaluation of neuropathy.    Starting in 2005, she had low back surgery and recalls waking up with bilateral feel numbness which persisted.  She had a revision decompression and posterior spinal fusion L3-S1 in 2007 and developed worsening numbness and painful electrical pain, worse on the left side.  Over the past 10 years, her pain has become more intense.  She describes it as stabbing, burning, and electrical-like. Numbness is constant and her pain is triggered by walking and standing.  Pain is improved with rest.  Last year, she began having spells of pain involving her fingertips. She has been seeing Dr. Myles Rosenthal pain management since ~2014 who prescribed morphine 60mg  and also has a spinal cord stimulator.  She has previously tried gabapentin, amitriptyline, Lyrica, Cymbalta, lidocaine patches without any relief. She has had NCS/EMG which is reportedly normal in 2009, skin biopsy which was consistent with small fiber neuropathy, and imaging of the lumbar spine last performed in 2007 which showed post-op arachnoiditis, central canal narrowing my epidural fat at L5-S1 and disc bulge at L3-4.  She also complains of episodic left ear sharp pain, lasting 6-hours.  It is preceded by scintillating  scotomoas and followed by nausea.  She takes ibuprofen which helps.  She usually gets them about 3 times per month.    Out-side paper records, electronic medical record, and images have been reviewed where available and summarized as:  Skin biopsy 04/10/2008:  Significantly reduced epidermal nerve riber density consistent with small fiber neuropathy  MRI lumbar spine wwo contrast 03/19/2006: 1. Postoperative changes PLIF and discectomy L4-5.  2. Potential clumping of nerve roots on the left at this level is suggestive of postoperative arachnoiditis. This does not appear to extend beyond the disc level in either direction.  3. Prominent ventral epidural fat L5-S1 creates some central canal narrowing.  4. No significant residual recurrent disc herniations.  5. Mild to moderate central canal stenosis L3-4 due to broad based disc bulge, facet hypertrophy, and ligamentum flavum thickening.  MRI thoracic spine 05/07/2006: 1. Shallow protrusions at T6-7 and T12-L1 without notable central canal or foraminal stenosis. 2. Partial visualization of protrusions at C3-4 and C6-7 in patient who is status post C4 to C6 ACDF.  Past Medical History  Diagnosis Date  . Diabetes ([place])   . Hypertension   . High cholesterol   . Neuropathy associated with MGUS [facility])     Past Surgical History  Procedure Laterality Date  . Neck surgery    . Back surgery    . Nasal sinus surgery       Medications:  Outpatient Encounter Prescriptions as of 10/15/2015  Medication Sig Note  . doxepin (SINEQUAN) 10 MG capsule Take 10 mg by mouth at bedtime. 10/15/2015: Received from: External Pharmacy  . HYDROcodone-ibuprofen (VICOPROFEN) 7.5-200 MG per tablet 1  tablet every 8 (eight) hours as needed.    Marland Kitchen ibuprofen (ADVIL,MOTRIN) 800 MG tablet  08/22/2014: Received from: External Pharmacy  . metFORMIN (GLUCOPHAGE-XR) 500 MG 24 hr tablet Take 1,000 mg by mouth Twice daily. Total of 2,000 mg   . metoprolol (TOPROL-XL) 100 MG  24 hr tablet Take 100 mg by mouth daily.     Marland Kitchen morphine (MS CONTIN) 60 MG 12 hr tablet  08/22/2014: Received from: External Pharmacy  . nystatin (MYCOSTATIN/NYSTOP) 100000 UNIT/GM POWD  08/22/2014: Received from: External Pharmacy  . nystatin-triamcinolone (MYCOLOG II) cream  08/22/2014: Received from: External Pharmacy  . pravastatin (PRAVACHOL) 40 MG tablet Take 40 mg by mouth daily. 08/22/2014: Received from: External Pharmacy  . PREVIDENT 5000 BOOSTER PLUS 1.1 % PSTE  10/15/2015: Received from: External Pharmacy  . Pyridoxine HCl (VITAMIN B-6) 250 MG tablet Take 1 tablet (250 mg total) by mouth daily.   . valsartan-hydrochlorothiazide (DIOVAN-HCT) 160-12.5 MG per tablet Take 1 tablet by mouth daily.   Marland Kitchen venlafaxine XR (EFFEXOR-XR) 150 MG 24 hr capsule Take 150 mg by mouth daily with breakfast.   . [DISCONTINUED] cyclobenzaprine (FLEXERIL) 10 MG tablet Take 10 mg by mouth as needed for muscle spasms.   . [DISCONTINUED] prednisoLONE acetate (PRED FORTE) 1 % ophthalmic suspension PLACE 1 DROP INTO AFFECTED EYE(S) 4 TIMES A DAY 02/20/2015: Received from: External Pharmacy   No facility-administered encounter medications on file as of 10/15/2015.     Allergies: No Known Allergies  Family History: Family History  Problem Relation Age of Onset  . Leukemia Father   . Heart disease Mother   . Hypertension Sister   . Stroke Maternal Grandfather   . Stroke Maternal Grandmother   . Healthy Son   . Healthy Daughter     Social History: Social History  Substance Use Topics  . Smoking status: Never Smoker   . Smokeless tobacco: Never Used     Comment: never used tobacco  . Alcohol Use: No   Social History   Social History Narrative   Lives with husband and daughter in a one story home.  Has 2 children.     On disability since 2009 for depression, low back pain.      Education: some college.    Review of Systems:  CONSTITUTIONAL: No fevers, chills, night sweats, or weight loss.   EYES:  No visual changes or eye pain ENT: No hearing changes.  No history of nose bleeds.   RESPIRATORY: No cough, wheezing and shortness of breath.   CARDIOVASCULAR: Negative for chest pain, and palpitations.   GI: Negative for abdominal discomfort, blood in stools or black stools.  No recent change in bowel habits.   GU:  No history of incontinence.   MUSCLOSKELETAL: +history of joint pain or swelling.  No myalgias.   SKIN: Negative for lesions, rash, and itching.   HEMATOLOGY/ONCOLOGY: Negative for prolonged bleeding, bruising easily, and swollen nodes.  No history of cancer.   ENDOCRINE: Negative for cold or heat intolerance, polydipsia or goiter.   PSYCH:  +depression or anxiety symptoms.   NEURO: As Above.   Vital Signs:  BP 110/76 mmHg  Pulse 106  Ht 5\' 6"  (1.676 m)  Wt 201 lb 1 oz (91.201 kg)  BMI 32.47 kg/m2  SpO2 95%   General Medical Exam:   General:  Well appearing, comfortable.   Eyes/ENT: see cranial nerve examination.   Neck: No masses appreciated.  Full range of motion without tenderness.  No carotid bruits. Respiratory:  Clear to auscultation, good air entry bilaterally.   Cardiac:  Regular rate and rhythm, no murmur.   Extremities:  No deformities, edema, or skin discoloration.  Skin:  No rashes or lesions.  Neurological Exam: MENTAL STATUS including orientation to time, place, person, recent and remote memory, attention span and concentration, language, and fund of knowledge is normal.  Speech is not dysarthric.  CRANIAL NERVES: II:  No visual field defects.  Unremarkable fundi.   III-IV-VI: Pupils equal round and reactive to light.  Normal conjugate, extra-ocular eye movements in all directions of gaze.  No nystagmus.  No ptosis.   V:  Normal facial sensation.    VII:  Normal facial symmetry and movements.  No pathologic facial reflexes.  VIII:  Normal hearing and vestibular function.   IX-X:  Normal palatal movement.   XI:  Normal shoulder shrug and head  rotation.   XII:  Normal tongue strength and range of motion, no deviation or fasciculation.  MOTOR:  No atrophy, fasciculations or abnormal movements.  No pronator drift.  Tone is normal.    Right Upper Extremity:    Left Upper Extremity:    Deltoid  5/5   Deltoid  5/5   Biceps  5/5   Biceps  5/5   Triceps  5/5   Triceps  5/5   Wrist extensors  5/5   Wrist extensors  5/5   Wrist flexors  5/5   Wrist flexors  5/5   Finger extensors  5/5   Finger extensors  5/5   Finger flexors  5/5   Finger flexors  5/5   Dorsal interossei  5/5   Dorsal interossei  5/5   Abductor pollicis  5/5   Abductor pollicis  5/5   Tone (Ashworth scale)  0  Tone (Ashworth scale)  0   Right Lower Extremity:    Left Lower Extremity:    Hip flexors  5/5   Hip flexors  5/5   Hip extensors  5/5   Hip extensors  5/5   Knee flexors  5/5   Knee flexors  5/5   Knee extensors  5/5   Knee extensors  5/5   Dorsiflexors  5/5   Dorsiflexors  5/5   Plantarflexors  5/5   Plantarflexors  5/5   Toe extensors  5-/5   Toe extensors  5-/5   Toe flexors  5-/5   Toe flexors  5-/5   Tone (Ashworth scale)  0  Tone (Ashworth scale)  0   MSRs:  Right                                                                 Left brachioradialis 2+  brachioradialis 2+  biceps 2+  biceps 2+  triceps 2+  triceps 2+  patellar 2+  patellar 2+  ankle jerk 2+  ankle jerk 2+  Hoffman no  Hoffman no  plantar response down  plantar response down   SENSORY:  Reduced temperature and vibration distal to ankles.  Romberg's sign absent.   COORDINATION/GAIT: Normal finger-to- nose-finger and heel-to-shin.  Intact rapid alternating movements bilaterally.  Able to rise from a chair without using arms.  Gait mildly wide-based and stable. She is unable to perform tandem or stressed gait  IMPRESSION: 1.  Painful predominately distal peripheral neuropathy secondary to diabetes and less likely MGUS.  She is very bothered that her symptoms are worsening and  involving her upper extremities and although this may certainly be the natural progression of neuropathy, NCS/EMG of the left side will be ordered to be sure a treatable type of neuropathy is not missed.  I will also check vitamin B12, TSH, and copper.  2.  Gait ataxia most likely due to low back pain and neuropathy.  She will start physical therapy for balance training.   Fall precautions discussed.  3.  Migraine with aura, episodic.  Continue iburpofen as needed, but instructed to limit to twice per week.  Current headache frequency is about once per week which is reasonable to treat with rescue medications.  4.  Low back pain s/p lumbar decompression and fusion L3-S1.  She is unable to get MRI due to spinal cord stimulator and I explained that even if there was degenerative changes on her MRI, her symptoms are not severe to warrant repeat surgery so management would not change therefore hold off on imaging.  Start PT for low back pain at [facility] (patient's request).  Return to clinic in 3 months.   The duration of this appointment visit was 50 minutes of face-to-face time with the patient.  Greater than 50% of this time was spent in counseling, explanation of diagnosis, planning of further management, and coordination of care.   Thank you for allowing me to participate in patient's care.  If I can answer any additional questions, I would be pleased to do so.    Sincerely,    Kacy Hegna K. Posey Pronto, DO

## 2015-10-15 NOTE — Patient Instructions (Addendum)
1.  NCS/EMG left arm and leg 2.  Check lab work 3.  Neck physiotherapy and PT for low pain  4.  OK to use ibuprofen as needed for headaches, but limit to twice per week  Return to clinic in 3 months

## 2015-10-16 NOTE — Progress Notes (Signed)
Note routed

## 2015-10-19 ENCOUNTER — Telehealth: Payer: Self-pay | Admitting: Neurology

## 2015-10-19 NOTE — Telephone Encounter (Signed)
Left patient a message to call me back.

## 2015-10-19 NOTE — Telephone Encounter (Signed)
PT called and has a question regarding her Physical Therapy/[facility] CB# [phone]

## 2015-10-20 LAB — COPPER, SERUM: COPPER: 149 ug/dL (ref 70–175)

## 2015-10-21 ENCOUNTER — Telehealth: Payer: Self-pay | Admitting: *Deleted

## 2015-10-21 NOTE — Telephone Encounter (Signed)
Patient called back and requested PT in [facility].  Referral sent.

## 2015-10-21 NOTE — Telephone Encounter (Signed)
Patient has not called me back but I did receive a note that [facility] is not accepting medicare at this time.  New referral sent to [facility]

## 2015-10-27 ENCOUNTER — Ambulatory Visit (INDEPENDENT_AMBULATORY_CARE_PROVIDER_SITE_OTHER): Payer: BLUE CROSS/BLUE SHIELD | Admitting: Neurology

## 2015-10-27 DIAGNOSIS — G43109 Migraine with aura, not intractable, without status migrainosus: Secondary | ICD-10-CM | POA: Diagnosis not present

## 2015-10-27 DIAGNOSIS — M5417 Radiculopathy, lumbosacral region: Secondary | ICD-10-CM

## 2015-10-27 NOTE — Procedures (Signed)
[facility]  [address]  [place], [place] [address] Tel: [phone] Fax:  [phone] Test Date:  10/27/2015  Patient: [name] DOB: 1950-12-11 Physician: Narda Amber, DO  Sex: Female Height: 5\' 6"  Ref Phys: Narda Amber, DO  ID#: [id number] Temp: 33.7C Technician: Jerilynn Mages. Dean   Patient Complaints: This is a 65 year old female with diabetes mellitus and MGUS referred for evaluation of generalized paresthesias of the upper and lower extremities.  NCV & EMG Findings: Extensive electrodiagnostic testing of the left upper and lower extremity shows: 1. Left median, ulnar, and palmar sensory responses are within normal limits. 2. Left median and ulnar motor responses are within normal limits. 3. Left sural and superficial peroneal sensory responses are within normal limits. 4. Left tibial and peroneal motor responses are within normal limits. 5. In the upper extremity, there is no evidence of active or chronic motor axon loss changes. Motor unit configuration and recruitment pattern is within normal limits. 6. In the lower extremity, chronic motor axon loss changes are seen affecting the L3-L5 myotomes, without accompanied active denervation.  Impression: 1. Chronic L2-L5 radiculopathy affecting the left lower extremity, moderate in degree electrically. 2. There is no evidence of a cervical radiculopathy, carpal tunnel syndrome, or generalized large fiber sensorimotor polyneuropathy affecting the left side. A small fiber neuropathy cannot be excluded by this study.   ___________________________ Narda Amber, DO    Nerve Conduction Studies Anti Sensory Summary Table   Site NR Peak (ms) Norm Peak (ms) P-T Amp (V) Norm P-T Amp  Left Median Anti Sensory (2nd Digit)  33.7C  Wrist    2.6 <3.8 37.0 >10  Left Sup Peroneal Anti Sensory (Ant Lat Mall)  33.7C  12 cm    3.5 <4.6 9.6 >3  Left Sural Anti Sensory (Lat Mall)  33.7C  Calf    3.6 <4.6 22.3 >3    Left Ulnar Anti Sensory (5th Digit)  33.7C  Wrist    2.8 <3.2 22.2 >5   Motor Summary Table   Site NR Onset (ms) Norm Onset (ms) O-P Amp (mV) Norm O-P Amp Site1 Site2 Delta-0 (ms) Dist (cm) Vel (m/s) Norm Vel (m/s)  Left Median Motor (Abd Poll Brev)  33.7C  Wrist    2.7 <4.0 7.4 >5 Elbow Wrist 3.9 23.0 59 >50  Elbow    6.6  6.4         Left Peroneal Motor (Ext Dig Brev)  33.7C  Ankle    3.3 <6.0 2.7 >2.5 B Fib Ankle 8.1 33.0 41 >40  B Fib    11.4  2.7  Poplt B Fib 1.9 10.0 53 >40  Poplt    13.3  2.5         Left Tibial Motor (Abd Hall Brev)  33.7C  Ankle    3.6 <6.0 4.7 >4 Knee Ankle 9.0 38.0 42 >40  Knee    12.6  2.4         Left Ulnar Motor (Abd Dig Minimi)  33.7C  Wrist    2.2 <3.1 7.0 >7 B Elbow Wrist 3.7 23.0 62 >50  B Elbow    5.9  6.2  A Elbow B Elbow 1.8 10.0 56 >50  A Elbow    7.7  5.8          Comparison Summary Table   Site NR Peak (ms) Norm Peak (ms) P-T Amp (V) Site1 Site2 Delta-P (ms) Norm Delta (ms)  Left Median/Ulnar Palm Comparison (Wrist - 8cm)  33.7C  Median Palm    1.4 <2.2 41.2 Median Palm Ulnar Palm 0.2   Ulnar Palm    1.6 <2.2 20.9       EMG   Side Muscle Ins Act Fibs Psw Fasc Number Recrt Dur Dur. Amp Amp. Poly Poly. Comment  Left AntTibialis Nml Nml Nml Nml 1- Rapid Some 1+ Some 1+ Nml Nml N/A  Left Gastroc Nml Nml Nml Nml Nml Nml Nml Nml Nml Nml Nml Nml N/A  Left Flex Dig Long Nml Nml Nml Nml 2- Rapid Some 1+ Some 1+ Nml Nml N/A  Left RectFemoris Nml Nml Nml Nml 2- Rapid Some 1+ Some 1+ Nml Nml N/A  Left GluteusMed Nml Nml Nml Nml 1- Rapid Some 1+ Some 1+ Nml Nml N/A  Left BicepsFemS Nml Nml Nml Nml Nml Nml Nml Nml Nml Nml Nml Nml N/A  Left AdductorLong Nml Nml Nml Nml 1- Mod-R Some 1+ Few 1+ Nml Nml N/A  Left 1stDorInt Nml Nml Nml Nml Nml Nml Nml Nml Nml Nml Nml Nml N/A  Left Ext Indicis Nml Nml Nml Nml Nml Nml Nml Nml Nml Nml Nml Nml N/A  Left PronatorTeres Nml Nml Nml Nml Nml Nml Nml Nml Nml Nml Nml Nml N/A  Left Biceps Nml Nml Nml Nml  Nml Nml Nml Nml Nml Nml Nml Nml N/A  Left Triceps Nml Nml Nml Nml Nml Nml Nml Nml Nml Nml Nml Nml N/A  Left Deltoid Nml Nml Nml Nml Nml Nml Nml Nml Nml Nml Nml Nml N/A      Waveforms:

## 2015-10-30 ENCOUNTER — Other Ambulatory Visit (HOSPITAL_BASED_OUTPATIENT_CLINIC_OR_DEPARTMENT_OTHER): Payer: BLUE CROSS/BLUE SHIELD

## 2015-10-30 ENCOUNTER — Ambulatory Visit (HOSPITAL_BASED_OUTPATIENT_CLINIC_OR_DEPARTMENT_OTHER): Payer: BLUE CROSS/BLUE SHIELD | Admitting: Family

## 2015-10-30 ENCOUNTER — Encounter: Payer: Self-pay | Admitting: Family

## 2015-10-30 VITALS — BP 131/73 | HR 81 | Temp 97.3°F | Resp 16 | Ht 66.0 in | Wt 214.0 lb

## 2015-10-30 DIAGNOSIS — G8929 Other chronic pain: Secondary | ICD-10-CM

## 2015-10-30 DIAGNOSIS — R51 Headache: Secondary | ICD-10-CM

## 2015-10-30 DIAGNOSIS — E1140 Type 2 diabetes mellitus with diabetic neuropathy, unspecified: Secondary | ICD-10-CM

## 2015-10-30 DIAGNOSIS — D472 Monoclonal gammopathy: Secondary | ICD-10-CM

## 2015-10-30 DIAGNOSIS — R519 Headache, unspecified: Secondary | ICD-10-CM

## 2015-10-30 DIAGNOSIS — M545 Low back pain: Secondary | ICD-10-CM

## 2015-10-30 DIAGNOSIS — G609 Hereditary and idiopathic neuropathy, unspecified: Secondary | ICD-10-CM

## 2015-10-30 LAB — COMPREHENSIVE METABOLIC PANEL
ALBUMIN: 3.7 g/dL (ref 3.5–5.0)
ALK PHOS: 60 U/L (ref 40–150)
ALT: 32 U/L (ref 0–55)
AST: 36 U/L — ABNORMAL HIGH (ref 5–34)
Anion Gap: 9 mEq/L (ref 3–11)
BILIRUBIN TOTAL: 0.59 mg/dL (ref 0.20–1.20)
BUN: 12.8 mg/dL (ref 7.0–26.0)
CHLORIDE: 102 meq/L (ref 98–109)
CO2: 28 mEq/L (ref 22–29)
Calcium: 9.4 mg/dL (ref 8.4–10.4)
Creatinine: 0.9 mg/dL (ref 0.6–1.1)
EGFR: 72 mL/min/{1.73_m2} — AB (ref >90)
GLUCOSE: 162 mg/dL — AB (ref 70–140)
POTASSIUM: 3.5 meq/L (ref 3.5–5.1)
SODIUM: 140 meq/L (ref 136–145)
Total Protein: 7.7 g/dL (ref 6.4–8.3)

## 2015-10-30 LAB — CBC WITH DIFFERENTIAL (CANCER CENTER ONLY)
BASO#: 0.1 10*3/uL (ref 0.0–0.2)
BASO%: 0.6 % (ref 0.0–2.0)
EOS%: 5.3 % (ref 0.0–7.0)
Eosinophils Absolute: 0.6 10*3/uL — ABNORMAL HIGH (ref 0.0–0.5)
HCT: 39.1 % (ref 34.8–46.6)
HEMOGLOBIN: 12.8 g/dL (ref 11.6–15.9)
LYMPH#: 5.5 10*3/uL — ABNORMAL HIGH (ref 0.9–3.3)
LYMPH%: 46.1 % (ref 14.0–48.0)
MCH: 29.1 pg (ref 26.0–34.0)
MCHC: 32.7 g/dL (ref 32.0–36.0)
MCV: 89 fL (ref 81–101)
MONO#: 1.1 10*3/uL — ABNORMAL HIGH (ref 0.1–0.9)
MONO%: 8.7 % (ref 0.0–13.0)
NEUT%: 39.3 % — ABNORMAL LOW (ref 39.6–80.0)
NEUTROS ABS: 4.7 10*3/uL (ref 1.5–6.5)
PLATELETS: 367 10*3/uL (ref 145–400)
RBC: 4.4 10*6/uL (ref 3.70–5.32)
RDW: 12.9 % (ref 11.1–15.7)
WBC: 12 10*3/uL — ABNORMAL HIGH (ref 3.9–10.0)

## 2015-10-30 NOTE — Progress Notes (Signed)
Hematology and Oncology Follow Up Visit  LEXANI HARER [id number] 09/18/1950 65 y.o. 10/30/2015   Principle Diagnosis:  1. IgG Kappa MGUS. 2. Chronic neuropathy  Current Therapy:   Observation  Interim History:  Ms. Azoulay is here today for a follow-up. She is doing fairly well. She states that she is now seeing Dr. Posey Pronto with Neurology and recently had a nerve stimulation test done. She will receive these result at her next follow-up with her office.  She has had a few episodes of feeling "off balance." Dr. Posey Pronto has referred her to PT for rehab. She will start this soon.  She feels that her neuropathy is slightly worse in her finger tips. This has not effected her dexterity.  Her blood sugars have been controlled and staying in the 120-130's. Her most recent Hgb A1c was 6.5.  She still has her spinal stimulator but this has been turned off. She states that she is thinking about having it removed.  In June, her M-spike was stable at 0.7 g/dl, IgG level was 1650 mg/dl and kappa light chain was 3.15 mg/dl.  No fever, chills, n/v, cough, rash, dizziness, SOB, chest pain, palpitations, abdominal pain or changes in bowel habits.  No swelling or tenderness in his extremities. No lymphadenopathy found on exam.  She has had no episodes of bleeding or bruising.  She has a good appetite but admits that she needs to be drinking more water. Her weight is up 6 lbs since her last visit with [name] in June.   Medications:    Medication List       This list is accurate as of: 10/30/15  1:00 PM.  Always use your most recent med list.               doxepin 10 MG capsule  Commonly known as:  SINEQUAN  Take 10 mg by mouth at bedtime.     HYDROcodone-ibuprofen 7.5-200 MG tablet  Commonly known as:  VICOPROFEN  1 tablet every 8 (eight) hours as needed.     ibuprofen 800 MG tablet  Commonly known as:  ADVIL,MOTRIN     metFORMIN 500 MG 24 hr tablet  Commonly known as:  GLUCOPHAGE-XR  Take  1,000 mg by mouth Twice daily. Total of 2,000 mg     metoprolol succinate 100 MG 24 hr tablet  Commonly known as:  TOPROL-XL  Take 100 mg by mouth daily.     morphine 60 MG 12 hr tablet  Commonly known as:  MS CONTIN     nystatin 100000 UNIT/GM Powd     nystatin-triamcinolone cream  Commonly known as:  MYCOLOG II     pravastatin 40 MG tablet  Commonly known as:  PRAVACHOL  Take 40 mg by mouth daily.     PREVIDENT 5000 BOOSTER PLUS 1.1 % Pste  Generic drug:  Sodium Fluoride     valsartan-hydrochlorothiazide 160-12.5 MG tablet  Commonly known as:  DIOVAN-HCT  Take 1 tablet by mouth daily.     venlafaxine XR 150 MG 24 hr capsule  Commonly known as:  EFFEXOR-XR  Take 150 mg by mouth daily with breakfast.     vitamin B-6 250 MG tablet  Take 1 tablet (250 mg total) by mouth daily.        Allergies: No Known Allergies  Past Medical History, Surgical history, Social history, and Family History were reviewed and updated.  Review of Systems: All other 10 point review of systems is negative.   Physical Exam:  vitals were not taken for this visit.  Wt Readings from Last 3 Encounters:  10/15/15 201 lb 1 oz (91.201 kg)  02/20/15 208 lb (94.348 kg)  08/22/14 208 lb (94.348 kg)    Ocular: Sclerae unicteric, pupils equal, round and reactive to light Ear-nose-throat: Oropharynx clear, dentition fair Lymphatic: No cervical supraclavicular or axillary adenopathy Lungs no rales or rhonchi, good excursion bilaterally Heart regular rate and rhythm, no murmur appreciated Abd soft, nontender, positive bowel sounds, no liver or spleen tip palpated on exam MSK no focal spinal tenderness, no joint edema Neuro: non-focal, well-oriented, appropriate affect Breasts: Deferred  Lab Results  Component Value Date   WBC 12.0* 10/30/2015   HGB 12.8 10/30/2015   HCT 39.1 10/30/2015   MCV 89 10/30/2015   PLT 367 10/30/2015   Lab Results  Component Value Date   FERRITIN 79 06/06/2012     IRON 79 09/02/2011   TIBC 421 09/02/2011   UIBC 342 09/02/2011   IRONPCTSAT 19* 09/02/2011   Lab Results  Component Value Date   RBC 4.40 10/30/2015   Lab Results  Component Value Date   KPAFRELGTCHN 3.15* 02/20/2015   LAMBDASER 2.45 02/20/2015   KAPLAMBRATIO 1.29 02/20/2015   Lab Results  Component Value Date   IGGSERUM 1650 02/20/2015   IGA 100 02/20/2015   IGMSERUM 65 02/20/2015   Lab Results  Component Value Date   TOTALPROTELP 7.7 02/20/2015   ALBUMINELP 4.3 02/20/2015   A1GS 0.3 02/20/2015   A2GS 0.9 02/20/2015   BETS 0.5 02/20/2015   BETA2SER 0.3 02/20/2015   GAMS 1.3 02/20/2015   MSPIKE 0.61 08/22/2014   SPEI * 02/20/2015     Chemistry      Component Value Date/Time   NA 138 02/20/2015 1010   NA 138 01/16/2014 1439   K 3.1* 02/20/2015 1010   K 3.4* 01/16/2014 1439   CL 94* 02/20/2015 1010   CL 97 01/16/2014 1439   CO2 29 02/20/2015 1010   CO2 30 01/16/2014 1439   BUN 14 02/20/2015 1010   BUN 10 01/16/2014 1439   CREATININE 1.2 02/20/2015 1010   CREATININE 0.73 01/16/2014 1439      Component Value Date/Time   CALCIUM 9.5 02/20/2015 1010   CALCIUM 8.9 01/16/2014 1439   ALKPHOS 58 02/20/2015 1010   ALKPHOS 58 01/16/2014 1439   AST 30 02/20/2015 1010   AST 36 01/16/2014 1439   ALT 28 02/20/2015 1010   ALT 27 01/16/2014 1439   BILITOT 1.00 02/20/2015 1010   BILITOT 0.9 01/16/2014 1439     Impression and Plan: Ms. Mckean is 65 yo white female with MGUS and also chronic neuropathy due to diabetes and chronic back issues. So far, her protein studies have been stable and she has had no problem with this. Her M-spike in June of last year was 0.7 g/dl. Her results from today's lab work are pending.  She is now being followed by Dr. Posey Pronto with neurology and is about to start PT to help strengthen her balance.   We will plan to see her back in 6 months for labs and follow-up.  She will contact us with any questions or concerns. We can certainly see her  sooner if need be.    Eliezer Bottom, NP 2/17/20171:00 PM

## 2015-10-31 LAB — IGG, IGA, IGM
IGM (IMMUNOGLOBIN M), SRM: 53 mg/dL (ref 26–217)
IgA, Qn, Serum: 99 mg/dL (ref 87–352)
IgG, Qn, Serum: 1399 mg/dL (ref 700–1600)

## 2015-11-02 LAB — KAPPA/LAMBDA LIGHT CHAINS
IG KAPPA FREE LIGHT CHAIN: 33.9 mg/L — AB (ref 3.30–19.40)
IG LAMBDA FREE LIGHT CHAIN: 27.26 mg/L — AB (ref 5.71–26.30)
Kappa/Lambda FluidC Ratio: 1.24 (ref 0.26–1.65)

## 2015-11-04 LAB — PROTEIN ELECTROPHORESIS, SERUM, WITH REFLEX
A/G Ratio: 0.9 (ref 0.7–1.7)
ALPHA 2: 0.9 g/dL (ref 0.4–1.0)
Albumin: 3.4 g/dL (ref 2.9–4.4)
Alpha 1: 0.2 g/dL (ref 0.0–0.4)
BETA: 1.2 g/dL (ref 0.7–1.3)
GAMMA GLOBULIN: 1.3 g/dL (ref 0.4–1.8)
GLOBULIN, TOTAL: 3.7 g/dL (ref 2.2–3.9)
IGM (IMMUNOGLOBIN M), SRM: 53 mg/dL (ref 26–217)
INTERPRETATION(SEE BELOW): 0
IgA, Qn, Serum: 94 mg/dL (ref 87–352)
IgG, Qn, Serum: 1497 mg/dL (ref 700–1600)
M-SPIKE, %: 0.9 g/dL — AB
TOTAL PROTEIN: 7.1 g/dL (ref 6.0–8.5)

## 2015-11-23 ENCOUNTER — Other Ambulatory Visit: Payer: Self-pay | Admitting: Obstetrics and Gynecology

## 2015-11-23 ENCOUNTER — Other Ambulatory Visit (HOSPITAL_COMMUNITY)
Admission: RE | Admit: 2015-11-23 | Discharge: 2015-11-23 | Disposition: A | Discharge status: Home / Self Care | Payer: BLUE CROSS/BLUE SHIELD | Source: Ambulatory Visit | Attending: Obstetrics and Gynecology | Admitting: Obstetrics and Gynecology

## 2015-11-23 DIAGNOSIS — Z01419 Encounter for gynecological examination (general) (routine) without abnormal findings: Secondary | ICD-10-CM | POA: Diagnosis not present

## 2015-11-24 LAB — CYTOLOGY - PAP

## 2015-12-21 ENCOUNTER — Telehealth: Payer: Self-pay | Admitting: Neurology

## 2015-12-21 NOTE — Telephone Encounter (Signed)
Noted  

## 2015-12-21 NOTE — Telephone Encounter (Signed)
Pt left message on the vm and needs to talk to someone about Physical therapy [phone]

## 2015-12-21 NOTE — Telephone Encounter (Signed)
Patient is going to stop PT for a while since she is in so much pain.  She has spoken to the therapist today about this and the therapist agreed.  Informed her that I will let Dr. Posey Pronto know.

## 2015-12-30 ENCOUNTER — Telehealth: Payer: Self-pay | Admitting: Neurology

## 2015-12-30 NOTE — Telephone Encounter (Signed)
PT called and asked about the results of her test/Dawn CB# [phone]

## 2015-12-31 NOTE — Telephone Encounter (Signed)
Left message for patient to call me back. 

## 2016-01-01 NOTE — Telephone Encounter (Signed)
Left message informing patient that the only test I see was an EMG done back in the winter.  Instructed her to call me back if this is the test she is talking about.

## 2016-01-14 ENCOUNTER — Ambulatory Visit: Payer: BLUE CROSS/BLUE SHIELD | Admitting: Neurology

## 2016-01-20 ENCOUNTER — Ambulatory Visit: Payer: BLUE CROSS/BLUE SHIELD | Admitting: Neurology

## 2016-02-12 ENCOUNTER — Encounter: Payer: Self-pay | Admitting: Neurology

## 2016-02-12 ENCOUNTER — Ambulatory Visit (INDEPENDENT_AMBULATORY_CARE_PROVIDER_SITE_OTHER): Payer: BLUE CROSS/BLUE SHIELD | Admitting: Neurology

## 2016-02-12 VITALS — BP 118/70 | HR 89 | Ht 66.0 in | Wt 212.1 lb

## 2016-02-12 DIAGNOSIS — G43109 Migraine with aura, not intractable, without status migrainosus: Secondary | ICD-10-CM | POA: Diagnosis not present

## 2016-02-12 DIAGNOSIS — M5417 Radiculopathy, lumbosacral region: Secondary | ICD-10-CM | POA: Diagnosis not present

## 2016-02-12 DIAGNOSIS — E0842 Diabetes mellitus due to underlying condition with diabetic polyneuropathy: Secondary | ICD-10-CM | POA: Diagnosis not present

## 2016-02-12 MED ORDER — CYCLOBENZAPRINE HCL 5 MG PO TABS: 5.0000 mg | ORAL_TABLET | Freq: Every evening | ORAL | Status: DC | PRN

## 2016-02-12 NOTE — Progress Notes (Signed)
Follow-up Visit   Date: 02/12/2016    [name] MRN: [id number] DOB: 1951-03-19   Interim History: [name] is a 65 y.o. right-handed Caucasian female with type 2 diabetes mellitus (HbA1c 6.5), s/p lumbar decompression and fusion at L3-S1, hypertension, hyperlipidemia, IgG kappa MGUS, chronic pain s/p spinal cord stimulator, and depression returning to the clinic for follow-up of neuropathy.  The patient was accompanied to the clinic by self.  History of present illness: Starting in 2005, she had low back surgery and recalls waking up with bilateral feel numbness which persisted. She had a revision decompression and posterior spinal fusion L3-S1 in 2007 and developed worsening numbness and painful electrical pain, worse on the left side. Over the past 10 years, her pain has become more intense. She describes it as stabbing, burning, and electrical-like. Numbness is constant and her pain is triggered by walking and standing. Pain is improved with rest. Last year, she began having spells of pain involving her fingertips. She has been seeing Dr. Myles Rosenthal pain management since ~2014 who prescribed morphine 60mg  and also has a spinal cord stimulator. She has previously tried gabapentin, amitriptyline, Lyrica, Cymbalta, lidocaine patches without any relief. She has had NCS/EMG which is reportedly normal in 2009, skin biopsy which was consistent with small fiber neuropathy, and imaging of the lumbar spine last performed in 2007 which showed post-op arachnoiditis, central canal narrowing my epidural fat at L5-S1 and disc bulge at L3-4.  She also complains of episodic left ear sharp pain, lasting 6-hours. It is preceded by scintillating scotomoas and followed by nausea. She takes ibuprofen which helps. She usually gets them about 3 times per month.  UPDATE 02/12/2016:  She complains of new left sided neck pain, very localized, and throbbing.  It lasts all day and wakes her up  from sleeping.  Her numbness/tingling is not as severe as previously and now only has it involving the feet.  She did PT which helped her balance.  She had one fall, but did not injure herself.   She continues to see Dr. Hardin Negus at [facility].  Medications:  Current Outpatient Prescriptions on File Prior to Visit  Medication Sig Dispense Refill  . HYDROcodone-ibuprofen (VICOPROFEN) 7.5-200 MG per tablet 1 tablet every 8 (eight) hours as needed.     Marland Kitchen ibuprofen (ADVIL,MOTRIN) 800 MG tablet   2  . metFORMIN (GLUCOPHAGE-XR) 500 MG 24 hr tablet Take 1,000 mg by mouth Twice daily. Total of 2,000 mg    . metoprolol (TOPROL-XL) 100 MG 24 hr tablet Take 100 mg by mouth daily.      Marland Kitchen morphine (MS CONTIN) 60 MG 12 hr tablet   0  . nystatin (MYCOSTATIN/NYSTOP) 100000 UNIT/GM POWD   3  . nystatin-triamcinolone (MYCOLOG II) cream   0  . pravastatin (PRAVACHOL) 40 MG tablet Take 40 mg by mouth daily.  1  . PREVIDENT 5000 BOOSTER PLUS 1.1 % PSTE     . Pyridoxine HCl (VITAMIN B-6) 250 MG tablet Take 1 tablet (250 mg total) by mouth daily. 30 tablet 12  . valsartan-hydrochlorothiazide (DIOVAN-HCT) 160-12.5 MG per tablet Take 1 tablet by mouth daily.    Marland Kitchen venlafaxine XR (EFFEXOR-XR) 150 MG 24 hr capsule Take 150 mg by mouth daily with breakfast.     No current facility-administered medications on file prior to visit.    Allergies: No Known Allergies  Review of Systems:  CONSTITUTIONAL: No fevers, chills, night sweats, or weight loss.  EYES: No  visual changes or eye pain ENT: No hearing changes.  No history of nose bleeds.   RESPIRATORY: No cough, wheezing and shortness of breath.   CARDIOVASCULAR: Negative for chest pain, and palpitations.   GI: Negative for abdominal discomfort, blood in stools or black stools.  No recent change in bowel habits.   GU:  No history of incontinence.   MUSCLOSKELETAL: +history of joint pain or swelling.  No myalgias.   SKIN: Negative for lesions, rash, and  itching.   ENDOCRINE: Negative for cold or heat intolerance, polydipsia or goiter.   PSYCH:  No depression or anxiety symptoms.   NEURO: As Above.   Vital Signs:  BP 118/70 mmHg  Pulse 89  Ht 5\' 6"  (1.676 m)  Wt 212 lb 2 oz (96.219 kg)  BMI 34.25 kg/m2  SpO2 95%  Neurological Exam: MENTAL STATUS including orientation to time, place, person, recent and remote memory, attention span and concentration, language, and fund of knowledge is normal.  Speech is not dysarthric.  CRANIAL NERVES: No visual field defects.  Pupils equal round and reactive to light.  Normal conjugate, extra-ocular eye movements in all directions of gaze.  No ptosis. Normal facial sensation.  Face is symmetric. Palate elevates symmetrically.  Tongue is midline.  MOTOR:  Motor strength is 5/5 in all extremities, except 5-/5 at toe flexion/extensors.  No atrophy, fasciculations or abnormal movements.  No pronator drift.  Tone is normal.    MSRs:  Reflexes are 2+/4 throughout  SENSORY:  Reduced temperature and vibration distal to ankles.  COORDINATION/GAIT: Gait mildly wide-based and stable. She is unable to perform tandem or stressed gait   Data: Labs 10/15/2015:  vitamin B12 349, copper 149, TSH 2.82  NCS/EMG 10/27/2015: 1. Chronic L2-L5 radiculopathy affecting the left lower extremity, moderate in degree electrically. 2. There is no evidence of a cervical radiculopathy, carpal tunnel syndrome, or generalized large fiber sensorimotor polyneuropathy affecting the left side. A small fiber neuropathy cannot be excluded by this study.  Skin biopsy 04/10/2008: Significantly reduced epidermal nerve riber density consistent with small fiber neuropathy  MRI lumbar spine wwo contrast 03/19/2006: 1. Postoperative changes PLIF and discectomy L4-5.  2. Potential clumping of nerve roots on the left at this level is suggestive of postoperative arachnoiditis. This does not appear to extend beyond the disc level in either  direction.  3. Prominent ventral epidural fat L5-S1 creates some central canal narrowing.  4. No significant residual recurrent disc herniations.  5. Mild to moderate central canal stenosis L3-4 due to broad based disc bulge, facet hypertrophy, and ligamentum flavum thickening.  MRI thoracic spine 05/07/2006: 1. Shallow protrusions at T6-7 and T12-L1 without notable central canal or foraminal stenosis. 2. Partial visualization of protrusions at C3-4 and C6-7 in patient who is status post C4 to C6 ACDF.  IMPRESSION/PLAN: 1. Painful predominately distal peripheral neuropathy (small fiber) secondary to diabetes and less likely MGUS.   - NCS/EMG did not reveal any evidence of a large fiber sensorimotor polyneuropathy involving the upper or lower extremities  - EMG did show L2-L5 radiculopathy affecting the left leg, likely residual from her previous surgery  - She has previously tried gabapentin, Lyrica, lidocaine patches  - Recommend a trial of amitriptyline 10mg  at bedtime  - She can also try alpha lipoic acid 400-600mg  daily  2. Gait ataxia most likely due to low back pain and neuropathy.  - She completed 5 weeks of PT, but decided to stop it and continue home exercises  -  Fall precautions discussed.  3. Migraine with aura, episodic.  - Continue iburpofen as needed, but instructed to limit to twice per week.  - Current headache frequency is about once per week which is reasonable to treat with rescue medications.  - Start flexeril 5mg  at bedtime for headaches  4. Low back pain s/p lumbar decompression and fusion L3-S1, followed by pain management  Return to clinic in 6 months  The duration of this appointment visit was 30 minutes of face-to-face time with the patient.  Greater than 50% of this time was spent in counseling, explanation of diagnosis, planning of further management, and coordination of care.   Thank you for allowing me to participate in patient's care.  If  I can answer any additional questions, I would be pleased to do so.    Sincerely,    Yahya Boldman K. Posey Pronto, DO

## 2016-02-12 NOTE — Patient Instructions (Addendum)
1.  Recommend starting amitriptyline 10mg  for neuropathic pain 2.  Start flexeril 5mg  at bedtime for neck spasm 3.  Continue home exercises 4.  Alpha-lipoic acid 400-600mg  daily  Return to clinic in 6 months

## 2016-03-10 ENCOUNTER — Ambulatory Visit: Payer: BLUE CROSS/BLUE SHIELD | Admitting: Neurology

## 2016-04-28 ENCOUNTER — Other Ambulatory Visit: Payer: BLUE CROSS/BLUE SHIELD

## 2016-04-28 ENCOUNTER — Ambulatory Visit: Payer: BLUE CROSS/BLUE SHIELD | Admitting: Hematology & Oncology

## 2016-05-03 ENCOUNTER — Ambulatory Visit: Payer: BLUE CROSS/BLUE SHIELD | Admitting: Hematology & Oncology

## 2016-05-03 ENCOUNTER — Other Ambulatory Visit: Payer: BLUE CROSS/BLUE SHIELD

## 2016-05-11 ENCOUNTER — Encounter: Payer: Self-pay | Admitting: Hematology & Oncology

## 2016-05-11 ENCOUNTER — Ambulatory Visit (HOSPITAL_BASED_OUTPATIENT_CLINIC_OR_DEPARTMENT_OTHER): Payer: BLUE CROSS/BLUE SHIELD | Admitting: Hematology & Oncology

## 2016-05-11 ENCOUNTER — Other Ambulatory Visit (HOSPITAL_BASED_OUTPATIENT_CLINIC_OR_DEPARTMENT_OTHER): Payer: BLUE CROSS/BLUE SHIELD

## 2016-05-11 VITALS — BP 136/68 | HR 86 | Temp 98.7°F | Resp 16 | Ht 66.0 in | Wt 214.0 lb

## 2016-05-11 DIAGNOSIS — Z1239 Encounter for other screening for malignant neoplasm of breast: Secondary | ICD-10-CM

## 2016-05-11 DIAGNOSIS — R221 Localized swelling, mass and lump, neck: Secondary | ICD-10-CM

## 2016-05-11 DIAGNOSIS — D472 Monoclonal gammopathy: Secondary | ICD-10-CM | POA: Diagnosis not present

## 2016-05-11 DIAGNOSIS — E1140 Type 2 diabetes mellitus with diabetic neuropathy, unspecified: Secondary | ICD-10-CM | POA: Diagnosis not present

## 2016-05-11 LAB — COMPREHENSIVE METABOLIC PANEL
ALT: 33 U/L (ref 0–55)
AST: 30 U/L (ref 5–34)
Albumin: 3.8 g/dL (ref 3.5–5.0)
Alkaline Phosphatase: 68 U/L (ref 40–150)
Anion Gap: 14 mEq/L — ABNORMAL HIGH (ref 3–11)
BUN: 15.4 mg/dL (ref 7.0–26.0)
CALCIUM: 9.3 mg/dL (ref 8.4–10.4)
CHLORIDE: 99 meq/L (ref 98–109)
CO2: 27 mEq/L (ref 22–29)
Creatinine: 1 mg/dL (ref 0.6–1.1)
EGFR: 61 mL/min/{1.73_m2} — ABNORMAL LOW (ref >90)
Glucose: 165 mg/dl — ABNORMAL HIGH (ref 70–140)
Potassium: 3.4 mEq/L — ABNORMAL LOW (ref 3.5–5.1)
Sodium: 140 mEq/L (ref 136–145)
Total Bilirubin: 0.81 mg/dL (ref 0.20–1.20)
Total Protein: 8.2 g/dL (ref 6.4–8.3)

## 2016-05-11 LAB — CBC WITH DIFFERENTIAL (CANCER CENTER ONLY)
BASO#: 0.1 10*3/uL (ref 0.0–0.2)
BASO%: 0.9 % (ref 0.0–2.0)
EOS%: 4.6 % (ref 0.0–7.0)
Eosinophils Absolute: 0.6 10*3/uL — ABNORMAL HIGH (ref 0.0–0.5)
HEMATOCRIT: 40.2 % (ref 34.8–46.6)
HGB: 13.5 g/dL (ref 11.6–15.9)
LYMPH#: 5.3 10*3/uL — AB (ref 0.9–3.3)
LYMPH%: 38.1 % (ref 14.0–48.0)
MCH: 30.1 pg (ref 26.0–34.0)
MCHC: 33.6 g/dL (ref 32.0–36.0)
MCV: 90 fL (ref 81–101)
MONO#: 1 10*3/uL — ABNORMAL HIGH (ref 0.1–0.9)
MONO%: 7.4 % (ref 0.0–13.0)
NEUT%: 49 % (ref 39.6–80.0)
NEUTROS ABS: 6.8 10*3/uL — AB (ref 1.5–6.5)
Platelets: 421 10*3/uL — ABNORMAL HIGH (ref 145–400)
RBC: 4.49 10*6/uL (ref 3.70–5.32)
RDW: 13 % (ref 11.1–15.7)
WBC: 13.8 10*3/uL — ABNORMAL HIGH (ref 3.9–10.0)

## 2016-05-12 LAB — KAPPA/LAMBDA LIGHT CHAINS
Ig Kappa Free Light Chain: 37 mg/L — ABNORMAL HIGH (ref 3.3–19.4)
Ig Lambda Free Light Chain: 26.5 mg/L — ABNORMAL HIGH (ref 5.7–26.3)
KAPPA/LAMBDA FLC RATIO: 1.4 (ref 0.26–1.65)

## 2016-05-16 NOTE — Progress Notes (Signed)
Hematology and Oncology Follow Up Visit  [name] [id number] 29-Dec-1950 65 y.o. 05/16/2016   Principle Diagnosis:  IgG Kappa MGUS. 2. Chronic neuropathy.  Current Therapy:   Observation.     Interim History:  Ms.  Dean is back for followup. We last saw her back in February..  Her main complaint has been pain behind the left ear. This seems to be in the cranium. I did go him get some x-rays of the cranium. There is no lytic lesions or other issues that could explain the discomfort.  Her monoclonal spike has been stable. When we last saw her it was 0. 61 g/dL. Her IgG level was 1260 mg/dL. Kappa Light chain was 2.98 mg/dL.  The neuropathy continues to be a problem. I really think this is coming from her back surgery. She also has diabetes. The MGUS in my mind has always been reactive. I know she has had a bone marrow biopsy in the past which was unremarkable.  We last saw her, she had an echocardiogram done which showed an ejection fraction of 60-65%. She also had pulmonary function studies done which for some reason the results are not back. We will saw not to get this.  She still has diaphoresis. She still has cough. .    She's had no diarrhea. She's had no dysuria.  Overall, her performance status is ECOG 1.    Medications:  Current Outpatient Prescriptions:  .  cyclobenzaprine (FLEXERIL) 5 MG tablet, Take 1 tablet (5 mg total) by mouth at bedtime as needed for muscle spasms., Disp: 30 tablet, Rfl: 2 .  HYDROcodone-ibuprofen (VICOPROFEN) 7.5-200 MG per tablet, 1 tablet every 8 (eight) hours as needed. , Disp: , Rfl:  .  ibuprofen (ADVIL,MOTRIN) 800 MG tablet, , Disp: , Rfl: 2 .  metFORMIN (GLUCOPHAGE-XR) 500 MG 24 hr tablet, Take 1,000 mg by mouth Twice daily. Total of 2,000 mg, Disp: , Rfl:  .  metoprolol (TOPROL-XL) 100 MG 24 hr tablet, Take 100 mg by mouth daily.  , Disp: , Rfl:  .  morphine (MS CONTIN) 60 MG 12 hr tablet, , Disp: , Rfl: 0 .  nystatin  (MYCOSTATIN/NYSTOP) 100000 UNIT/GM POWD, as needed. , Disp: , Rfl: 3 .  nystatin-triamcinolone (MYCOLOG II) cream, as needed. , Disp: , Rfl: 0 .  pravastatin (PRAVACHOL) 40 MG tablet, Take 40 mg by mouth daily., Disp: , Rfl: 1 .  PREVIDENT 5000 BOOSTER PLUS 1.1 % PSTE, , Disp: , Rfl:  .  valsartan-hydrochlorothiazide (DIOVAN-HCT) 160-12.5 MG per tablet, Take 1 tablet by mouth daily., Disp: , Rfl:  .  venlafaxine XR (EFFEXOR-XR) 150 MG 24 hr capsule, Take 150 mg by mouth daily with breakfast., Disp: , Rfl:   Allergies: No Known Allergies  Past Medical History, Surgical history, Social history, and Family History were reviewed and updated.  Review of Systems: As above  Physical Exam:  height is 5' 6"  (1.676 m) and weight is 214 lb (97.1 kg). Her oral temperature is 98.7 F (37.1 C). Her blood pressure is 136/68 and her pulse is 86. Her respiration is 16.   Well-developed and well-nourished white female. Head and neck exam shows some tenderness over the maxillary sinuses. No oral lesions are noted. No adenopathy noted in the neck. Lungs are clear. Cardiac exam regular in rhythm. Abdomen is soft. She good bowel sounds. There is no fluid wave. There is no palpable hepatosplenomegaly. Her exam shows a laminectomy scar in the lumbar spine. No tenderness over the spine  ribs or hips. Extremities shows some trace edema in her legs. Has good strength in her legs. She has good range of motion of her joints. Neurological exam shows some increased sensation to touch and it feet and lower legs. Skin exam no rashes.  Lab Results  Component Value Date   WBC 13.8 (H) 05/11/2016   HGB 13.5 05/11/2016   HCT 40.2 05/11/2016   MCV 90 05/11/2016   PLT 421 (H) 05/11/2016     Chemistry      Component Value Date/Time   NA 140 05/11/2016 0942   K 3.4 (L) 05/11/2016 0942   CL 94 (L) 02/20/2015 1010   CO2 27 05/11/2016 0942   BUN 15.4 05/11/2016 0942   CREATININE 1.0 05/11/2016 0942      Component Value  Date/Time   CALCIUM 9.3 05/11/2016 0942   ALKPHOS 68 05/11/2016 0942   AST 30 05/11/2016 0942   ALT 33 05/11/2016 0942   BILITOT 0.81 05/11/2016 0942         Impression and Plan: [name] is 65 year old white female with diabetes. Her blood sugars are still quite high. I still think the neuropathy is from her diabetes and from her back surgery. She does have a stimulator in place. This really is not helping her. I think she'll have this removed. .  We will go ahead and get a CT scan of her neck to make sure there is nothing with this lymph node under the left ear. With the MGUS, we do have to worry about the possibility of underlying lymphoma.  Of note, her M spike is up a little bit more. This we will have to watch closely. Her M spike is now up to 0.9 g/dL. Her IgG level is 1500 mg/dL. Her Kappa Lightchain is 3.7 mg/dL.  I like to get her back in 1 month just to follow up with everything.   I spent a good half hour with her.   [name], MD 9/4/20179:04 AM

## 2016-05-17 LAB — MULTIPLE MYELOMA PANEL, SERUM
Albumin SerPl Elph-Mcnc: 3.8 g/dL (ref 2.9–4.4)
Albumin/Glob SerPl: 1.1 (ref 0.7–1.7)
Alpha 1: 0.2 g/dL (ref 0.0–0.4)
Alpha2 Glob SerPl Elph-Mcnc: 0.9 g/dL (ref 0.4–1.0)
B-Globulin SerPl Elph-Mcnc: 1.2 g/dL (ref 0.7–1.3)
GAMMA GLOB SERPL ELPH-MCNC: 1.3 g/dL (ref 0.4–1.8)
Globulin, Total: 3.6 g/dL (ref 2.2–3.9)
IGA/IMMUNOGLOBULIN A, SERUM: 98 mg/dL (ref 87–352)
IgG, Qn, Serum: 1449 mg/dL (ref 700–1600)
IgM, Qn, Serum: 57 mg/dL (ref 26–217)
M Protein SerPl Elph-Mcnc: 0.7 g/dL — ABNORMAL HIGH
TOTAL PROTEIN: 7.4 g/dL (ref 6.0–8.5)

## 2016-05-24 ENCOUNTER — Ambulatory Visit (HOSPITAL_BASED_OUTPATIENT_CLINIC_OR_DEPARTMENT_OTHER)
Admission: RE | Admit: 2016-05-24 | Discharge: 2016-05-24 | Disposition: A | Discharge status: Home / Self Care | Payer: BLUE CROSS/BLUE SHIELD | Source: Ambulatory Visit | Attending: Hematology & Oncology | Admitting: Hematology & Oncology

## 2016-05-24 ENCOUNTER — Encounter (HOSPITAL_BASED_OUTPATIENT_CLINIC_OR_DEPARTMENT_OTHER): Payer: Self-pay

## 2016-05-24 ENCOUNTER — Other Ambulatory Visit: Payer: Self-pay | Admitting: Hematology & Oncology

## 2016-05-24 DIAGNOSIS — Z1239 Encounter for other screening for malignant neoplasm of breast: Secondary | ICD-10-CM

## 2016-05-24 DIAGNOSIS — R221 Localized swelling, mass and lump, neck: Secondary | ICD-10-CM

## 2016-05-24 DIAGNOSIS — Z1231 Encounter for screening mammogram for malignant neoplasm of breast: Secondary | ICD-10-CM | POA: Diagnosis not present

## 2016-05-24 HISTORY — DX: Unspecified asthma, uncomplicated: J45.909

## 2016-05-24 MED ORDER — IOPAMIDOL (ISOVUE-300) INJECTION 61%
100.0000 mL | Freq: Once | INTRAVENOUS | Status: AC | PRN
  Administered 2016-05-24: 75 mL via INTRAVENOUS

## 2016-05-25 ENCOUNTER — Encounter: Payer: Self-pay | Admitting: *Deleted

## 2016-06-03 ENCOUNTER — Other Ambulatory Visit: Payer: BLUE CROSS/BLUE SHIELD

## 2016-06-03 ENCOUNTER — Ambulatory Visit: Payer: BLUE CROSS/BLUE SHIELD | Admitting: Hematology & Oncology

## 2016-06-15 ENCOUNTER — Telehealth: Payer: Self-pay | Admitting: *Deleted

## 2016-06-15 ENCOUNTER — Ambulatory Visit: Payer: BLUE CROSS/BLUE SHIELD | Admitting: Hematology & Oncology

## 2016-06-15 NOTE — Telephone Encounter (Signed)
Patient unable to come to appointment today.  Dr. Marin Olp states she can reschedule for 3 months.

## 2016-08-12 ENCOUNTER — Ambulatory Visit: Payer: BLUE CROSS/BLUE SHIELD | Admitting: Neurology

## 2016-08-12 DIAGNOSIS — T85113A Breakdown (mechanical) of implanted electronic neurostimulator, generator, initial encounter: Secondary | ICD-10-CM | POA: Insufficient documentation

## 2016-08-12 DIAGNOSIS — M961 Postlaminectomy syndrome, not elsewhere classified: Secondary | ICD-10-CM | POA: Insufficient documentation

## 2016-08-12 DIAGNOSIS — M791 Myalgia, unspecified site: Secondary | ICD-10-CM | POA: Insufficient documentation

## 2016-09-02 ENCOUNTER — Other Ambulatory Visit: Payer: Self-pay | Admitting: Orthopaedic Surgery

## 2016-09-02 DIAGNOSIS — M961 Postlaminectomy syndrome, not elsewhere classified: Secondary | ICD-10-CM

## 2016-09-08 ENCOUNTER — Other Ambulatory Visit: Payer: Self-pay

## 2016-09-08 DIAGNOSIS — D472 Monoclonal gammopathy: Secondary | ICD-10-CM

## 2016-09-09 ENCOUNTER — Ambulatory Visit (HOSPITAL_BASED_OUTPATIENT_CLINIC_OR_DEPARTMENT_OTHER): Payer: BLUE CROSS/BLUE SHIELD | Admitting: Family

## 2016-09-09 ENCOUNTER — Other Ambulatory Visit (HOSPITAL_BASED_OUTPATIENT_CLINIC_OR_DEPARTMENT_OTHER): Payer: BLUE CROSS/BLUE SHIELD

## 2016-09-09 VITALS — BP 129/63 | HR 91 | Temp 98.1°F | Resp 18 | Wt 212.4 lb

## 2016-09-09 DIAGNOSIS — D472 Monoclonal gammopathy: Secondary | ICD-10-CM | POA: Diagnosis not present

## 2016-09-09 LAB — CBC WITH DIFFERENTIAL (CANCER CENTER ONLY)
BASO#: 0.1 10*3/uL (ref 0.0–0.2)
BASO%: 0.7 % (ref 0.0–2.0)
EOS%: 3.9 % (ref 0.0–7.0)
Eosinophils Absolute: 0.5 10*3/uL (ref 0.0–0.5)
HCT: 40.1 % (ref 34.8–46.6)
HGB: 13.2 g/dL (ref 11.6–15.9)
LYMPH#: 5.6 10*3/uL — ABNORMAL HIGH (ref 0.9–3.3)
LYMPH%: 41.7 % (ref 14.0–48.0)
MCH: 29.3 pg (ref 26.0–34.0)
MCHC: 32.9 g/dL (ref 32.0–36.0)
MCV: 89 fL (ref 81–101)
MONO#: 0.9 10*3/uL (ref 0.1–0.9)
MONO%: 6.5 % (ref 0.0–13.0)
NEUT#: 6.3 10*3/uL (ref 1.5–6.5)
NEUT%: 47.2 % (ref 39.6–80.0)
PLATELETS: 396 10*3/uL (ref 145–400)
RBC: 4.5 10*6/uL (ref 3.70–5.32)
RDW: 13.3 % (ref 11.1–15.7)
WBC: 13.3 10*3/uL — AB (ref 3.9–10.0)

## 2016-09-09 LAB — COMPREHENSIVE METABOLIC PANEL
ALT: 22 U/L (ref 0–55)
AST: 21 U/L (ref 5–34)
Albumin: 3.7 g/dL (ref 3.5–5.0)
Alkaline Phosphatase: 68 U/L (ref 40–150)
Anion Gap: 13 mEq/L — ABNORMAL HIGH (ref 3–11)
BUN: 16.8 mg/dL (ref 7.0–26.0)
CHLORIDE: 100 meq/L (ref 98–109)
CO2: 26 meq/L (ref 22–29)
CREATININE: 0.9 mg/dL (ref 0.6–1.1)
Calcium: 9.9 mg/dL (ref 8.4–10.4)
EGFR: 68 mL/min/{1.73_m2} — ABNORMAL LOW (ref >90)
Glucose: 208 mg/dl — ABNORMAL HIGH (ref 70–140)
Potassium: 3.4 mEq/L — ABNORMAL LOW (ref 3.5–5.1)
Sodium: 139 mEq/L (ref 136–145)
TOTAL PROTEIN: 8.1 g/dL (ref 6.4–8.3)
Total Bilirubin: 0.65 mg/dL (ref 0.20–1.20)

## 2016-09-09 NOTE — Progress Notes (Signed)
Hematology and Oncology Follow Up Visit  [name] [id number] 1951-06-21 65 y.o. 09/09/2016   Principle Diagnosis:  1. IgG Kappa MGUS. 2. Chronic neuropathy  Current Therapy:   Observation  Interim History:  [name] is here today for a follow-up. She is doing fairly well. She is still having neuropathy in both her feet which is unchanged. She did have a fall in October where she stated that her right foot went out from under her and she ended up with a Jones fracture. Today is her first day out off a boot and in her regular shoe. She is doing well and has a cane to use as needed.  No other falls, no syncope.  In August, her M-spike was back down to 0.7 g/dL, IgG level was 1,449 mg/dL and kappa light chain was 3.7 mg/dL. Today's protein studies are pending.  She has occasional fatigue. Her appetite is also a little down. She is staying hydrated and her weight is stable.  She had her spinal stimulator removed on December 5th. She goes this Sunday for a repeat MRI to re-evaluate for changes.  She has had no problem with infections. No fever, chills, n/v, cough, rash, dizziness, SOB, chest pain, palpitations, abdominal pain or changes in bowel habits.  No swelling or tenderness in his extremities. No lymphadenopathy found on exam.  She has had no episodes of bleeding or bruising.   Medications:  Allergies as of 09/09/2016   No Known Allergies     Medication List       Accurate as of 09/09/16  1:08 PM. Always use your most recent med list.          cyclobenzaprine 5 MG tablet Commonly known as:  FLEXERIL Take 1 tablet (5 mg total) by mouth at bedtime as needed for muscle spasms.   ibuprofen 800 MG tablet Commonly known as:  ADVIL,MOTRIN   metFORMIN 500 MG 24 hr tablet Commonly known as:  GLUCOPHAGE-XR Take 1,000 mg by mouth Twice daily. Total of 2,000 mg   metoprolol succinate 100 MG 24 hr tablet Commonly known as:  TOPROL-XL Take 100 mg by mouth daily.     morphine 60 MG 12 hr tablet Commonly known as:  MS CONTIN   nystatin powder Generic drug:  nystatin as needed.   nystatin-triamcinolone cream Commonly known as:  MYCOLOG II as needed.   pravastatin 40 MG tablet Commonly known as:  PRAVACHOL Take 40 mg by mouth daily.   valsartan-hydrochlorothiazide 160-12.5 MG tablet Commonly known as:  DIOVAN-HCT Take 1 tablet by mouth daily.   venlafaxine XR 150 MG 24 hr capsule Commonly known as:  EFFEXOR-XR Take 150 mg by mouth daily with breakfast.       Allergies: No Known Allergies  Past Medical History, Surgical history, Social history, and Family History were reviewed and updated.  Review of Systems: All other 10 point review of systems is negative.   Physical Exam:  weight is 212 lb 6.4 oz (96.3 kg). Her oral temperature is 98.1 F (36.7 C). Her blood pressure is 129/63 and her pulse is 91. Her respiration is 18 and oxygen saturation is 100%.   Wt Readings from Last 3 Encounters:  09/09/16 212 lb 6.4 oz (96.3 kg)  05/11/16 214 lb (97.1 kg)  02/12/16 212 lb 2 oz (96.2 kg)    Ocular: Sclerae unicteric, pupils equal, round and reactive to light Ear-nose-throat: Oropharynx clear, dentition fair Lymphatic: No cervical supraclavicular or axillary adenopathy Lungs no rales or rhonchi,  good excursion bilaterally Heart regular rate and rhythm, no murmur appreciated Abd soft, nontender, positive bowel sounds, no liver or spleen tip palpated on exam, no fluid wave MSK no focal spinal tenderness, no joint edema Neuro: non-focal, well-oriented, appropriate affect Breasts: Deferred  Lab Results  Component Value Date   WBC 13.3 (H) 09/09/2016   HGB 13.2 09/09/2016   HCT 40.1 09/09/2016   MCV 89 09/09/2016   PLT 396 09/09/2016   Lab Results  Component Value Date   FERRITIN 79 06/06/2012   IRON 79 09/02/2011   TIBC 421 09/02/2011   UIBC 342 09/02/2011   IRONPCTSAT 19 (L) 09/02/2011   Lab Results  Component Value Date    RBC 4.50 09/09/2016   Lab Results  Component Value Date   KPAFRELGTCHN 3.15 (H) 02/20/2015   LAMBDASER 2.45 02/20/2015   KAPLAMBRATIO 1.40 05/11/2016   Lab Results  Component Value Date   IGGSERUM 1,449 05/11/2016   IGA 100 02/20/2015   IGMSERUM 57 05/11/2016   Lab Results  Component Value Date   TOTALPROTELP 7.7 02/20/2015   ALBUMINELP 4.3 02/20/2015   A1GS 0.3 02/20/2015   A2GS 0.9 02/20/2015   BETS 0.5 02/20/2015   BETA2SER 0.3 02/20/2015   GAMS 1.3 02/20/2015   MSPIKE 0.9 (H) 10/30/2015   SPEI * 02/20/2015     Chemistry      Component Value Date/Time   NA 140 05/11/2016 0942   K 3.4 (L) 05/11/2016 0942   CL 94 (L) 02/20/2015 1010   CO2 27 05/11/2016 0942   BUN 15.4 05/11/2016 0942   CREATININE 1.0 05/11/2016 0942      Component Value Date/Time   CALCIUM 9.3 05/11/2016 0942   ALKPHOS 68 05/11/2016 0942   AST 30 05/11/2016 0942   ALT 33 05/11/2016 0942   BILITOT 0.81 05/11/2016 0942     Impression and Plan: [name] is 65 yo white female with MGUS and also chronic neuropathy due to diabetes and chronic back issues. She continues to do well. Her neuropathy is unchanged and her protein studies have remained stable. Levels with today's lab work are pending.  Her M-spike in August had come back down to 0.7 g/dL and IgG level was 1,449 mg/dL. Kappa light chain was 3.7 mg/dL.  We will plan to see her back in 6 months for repeat lab work and follow-up.  She will contact us with any questions or concerns. We can certainly see her sooner if need be.    Eliezer Bottom, NP 12/29/20171:08 PM

## 2016-09-11 ENCOUNTER — Ambulatory Visit
Admission: RE | Admit: 2016-09-11 | Discharge: 2016-09-11 | Disposition: A | Discharge status: Home / Self Care | Payer: BLUE CROSS/BLUE SHIELD | Source: Ambulatory Visit | Attending: Orthopaedic Surgery | Admitting: Orthopaedic Surgery

## 2016-09-11 DIAGNOSIS — M961 Postlaminectomy syndrome, not elsewhere classified: Secondary | ICD-10-CM

## 2016-09-13 LAB — KAPPA/LAMBDA LIGHT CHAINS
IG KAPPA FREE LIGHT CHAIN: 30.1 mg/L — AB (ref 3.3–19.4)
Ig Lambda Free Light Chain: 28 mg/L — ABNORMAL HIGH (ref 5.7–26.3)
Kappa/Lambda FluidC Ratio: 1.08 (ref 0.26–1.65)

## 2016-09-14 LAB — MULTIPLE MYELOMA PANEL, SERUM
ALBUMIN/GLOB SERPL: 0.9 (ref 0.7–1.7)
Albumin SerPl Elph-Mcnc: 3.5 g/dL (ref 2.9–4.4)
Alpha 1: 0.3 g/dL (ref 0.0–0.4)
Alpha2 Glob SerPl Elph-Mcnc: 0.9 g/dL (ref 0.4–1.0)
B-GLOBULIN SERPL ELPH-MCNC: 1.3 g/dL (ref 0.7–1.3)
GAMMA GLOB SERPL ELPH-MCNC: 1.5 g/dL (ref 0.4–1.8)
GLOBULIN, TOTAL: 3.9 g/dL (ref 2.2–3.9)
IgA, Qn, Serum: 99 mg/dL (ref 87–352)
IgG, Qn, Serum: 1524 mg/dL (ref 700–1600)
IgM, Qn, Serum: 61 mg/dL (ref 26–217)
M PROTEIN SERPL ELPH-MCNC: 0.9 g/dL — AB
Total Protein: 7.4 g/dL (ref 6.0–8.5)

## 2016-09-15 ENCOUNTER — Other Ambulatory Visit: Payer: BLUE CROSS/BLUE SHIELD

## 2016-09-15 ENCOUNTER — Ambulatory Visit: Payer: BLUE CROSS/BLUE SHIELD | Admitting: Hematology & Oncology

## 2017-03-10 ENCOUNTER — Ambulatory Visit: Payer: BLUE CROSS/BLUE SHIELD | Admitting: Hematology & Oncology

## 2017-03-10 ENCOUNTER — Other Ambulatory Visit: Payer: BLUE CROSS/BLUE SHIELD

## 2017-03-27 ENCOUNTER — Ambulatory Visit (HOSPITAL_BASED_OUTPATIENT_CLINIC_OR_DEPARTMENT_OTHER): Payer: BLUE CROSS/BLUE SHIELD | Admitting: Hematology & Oncology

## 2017-03-27 ENCOUNTER — Ambulatory Visit (HOSPITAL_BASED_OUTPATIENT_CLINIC_OR_DEPARTMENT_OTHER)
Admission: RE | Admit: 2017-03-27 | Discharge: 2017-03-27 | Disposition: A | Discharge status: Home / Self Care | Payer: BLUE CROSS/BLUE SHIELD | Source: Ambulatory Visit | Attending: Hematology & Oncology | Admitting: Hematology & Oncology

## 2017-03-27 ENCOUNTER — Other Ambulatory Visit (HOSPITAL_BASED_OUTPATIENT_CLINIC_OR_DEPARTMENT_OTHER): Payer: BLUE CROSS/BLUE SHIELD

## 2017-03-27 VITALS — BP 150/72 | HR 101 | Temp 99.4°F | Resp 18 | Wt 212.0 lb

## 2017-03-27 DIAGNOSIS — D472 Monoclonal gammopathy: Secondary | ICD-10-CM

## 2017-03-27 DIAGNOSIS — M545 Low back pain, unspecified: Secondary | ICD-10-CM

## 2017-03-27 DIAGNOSIS — Z981 Arthrodesis status: Secondary | ICD-10-CM | POA: Insufficient documentation

## 2017-03-27 DIAGNOSIS — I700 Atherosclerosis of aorta: Secondary | ICD-10-CM | POA: Insufficient documentation

## 2017-03-27 LAB — COMPREHENSIVE METABOLIC PANEL
ALBUMIN: 3.9 g/dL (ref 3.5–5.0)
ALK PHOS: 69 U/L (ref 40–150)
ALT: 28 U/L (ref 0–55)
ANION GAP: 13 meq/L — AB (ref 3–11)
AST: 26 U/L (ref 5–34)
BILIRUBIN TOTAL: 0.76 mg/dL (ref 0.20–1.20)
BUN: 14.9 mg/dL (ref 7.0–26.0)
CALCIUM: 10 mg/dL (ref 8.4–10.4)
CO2: 28 mEq/L (ref 22–29)
Chloride: 99 mEq/L (ref 98–109)
Creatinine: 0.9 mg/dL (ref 0.6–1.1)
EGFR: 68 mL/min/{1.73_m2} — AB (ref >90)
GLUCOSE: 140 mg/dL (ref 70–140)
Potassium: 3.7 mEq/L (ref 3.5–5.1)
Sodium: 140 mEq/L (ref 136–145)
TOTAL PROTEIN: 8.1 g/dL (ref 6.4–8.3)

## 2017-03-27 LAB — CBC WITH DIFFERENTIAL (CANCER CENTER ONLY)
BASO#: 0.1 10*3/uL (ref 0.0–0.2)
BASO%: 0.6 % (ref 0.0–2.0)
EOS ABS: 0.6 10*3/uL — AB (ref 0.0–0.5)
EOS%: 4.3 % (ref 0.0–7.0)
HEMATOCRIT: 41.2 % (ref 34.8–46.6)
HEMOGLOBIN: 13.5 g/dL (ref 11.6–15.9)
LYMPH#: 5.4 10*3/uL — ABNORMAL HIGH (ref 0.9–3.3)
LYMPH%: 37.5 % (ref 14.0–48.0)
MCH: 29.5 pg (ref 26.0–34.0)
MCHC: 32.8 g/dL (ref 32.0–36.0)
MCV: 90 fL (ref 81–101)
MONO#: 1.1 10*3/uL — AB (ref 0.1–0.9)
MONO%: 7.5 % (ref 0.0–13.0)
NEUT%: 50.1 % (ref 39.6–80.0)
NEUTROS ABS: 7.2 10*3/uL — AB (ref 1.5–6.5)
Platelets: 417 10*3/uL — ABNORMAL HIGH (ref 145–400)
RBC: 4.58 10*6/uL (ref 3.70–5.32)
RDW: 13.1 % (ref 11.1–15.7)
WBC: 14.3 10*3/uL — AB (ref 3.9–10.0)

## 2017-03-27 MED ORDER — TRAMADOL HCL 50 MG PO TABS: 50.0000 mg | ORAL_TABLET | Freq: Four times a day (QID) | ORAL | 0 refills | Status: DC | PRN

## 2017-03-27 NOTE — Progress Notes (Signed)
Hematology and Oncology Follow Up Visit  [name] [id number] Jun 28, 1951 66 y.o. 03/27/2017   Principle Diagnosis:  1. IgG Kappa MGUS. 2. Chronic neuropathy  Current Therapy:   Observation  Interim History:  [name] is here today for a follow-up. She is not feeling well. She has been having some lower back issues. She's had back surgery. She's had back fusion. We did get some x-rays of her lower back today. No changes were noted that suggested any type of myelomatous involvement.  When she was last seen in December 2017, her M spike was 0.9 g/dL. Her IgG level was 1524 milligrams per deciliter. Her Kappa Light chain was 3 mg/dL. All this is holding pretty steady.   She's been having some fever. She been having some rashes. She's been having no obvious change in bowel or bladder habits.   She does have quite a few medical problems.   She has had no episodes of bleeding or bruising.   Overall, I would see that her performance status is ECOG 2  Medications:  Allergies as of 03/27/2017   No Known Allergies     Medication List       Accurate as of 03/27/17  8:32 AM. Always use your most recent med list.          cyclobenzaprine 5 MG tablet Commonly known as:  FLEXERIL Take 1 tablet (5 mg total) by mouth at bedtime as needed for muscle spasms.   ibuprofen 800 MG tablet Commonly known as:  ADVIL,MOTRIN   metFORMIN 500 MG 24 hr tablet Commonly known as:  GLUCOPHAGE-XR Take 1,000 mg by mouth Twice daily. Total of 2,000 mg   metoprolol succinate 100 MG 24 hr tablet Commonly known as:  TOPROL-XL Take 100 mg by mouth daily.   morphine 60 MG 12 hr tablet Commonly known as:  MS CONTIN   nystatin powder Generic drug:  nystatin as needed.   nystatin-triamcinolone cream Commonly known as:  MYCOLOG II as needed.   pravastatin 40 MG tablet Commonly known as:  PRAVACHOL Take 40 mg by mouth daily.   valsartan-hydrochlorothiazide 160-12.5 MG tablet Commonly  known as:  DIOVAN-HCT Take 1 tablet by mouth daily.   venlafaxine XR 150 MG 24 hr capsule Commonly known as:  EFFEXOR-XR Take 150 mg by mouth daily with breakfast.       Allergies: No Known Allergies  Past Medical History, Surgical history, Social history, and Family History were reviewed and updated.  Review of Systems: All other 10 point review of systems is negative.   Physical Exam:  weight is 212 lb (96.2 kg). Her oral temperature is 99.4 F (37.4 C). Her blood pressure is 150/72 (abnormal) and her pulse is 101 (abnormal). Her respiration is 18 and oxygen saturation is 92%.   Wt Readings from Last 3 Encounters:  03/27/17 212 lb (96.2 kg)  09/09/16 212 lb 6.4 oz (96.3 kg)  05/11/16 214 lb (97.1 kg)    Well-developed and well-nourished white female. Head and neck exam shows some tenderness over the maxillary sinuses. No oral lesions are noted. No adenopathy noted in the neck. Lungs are clear. Cardiac exam regular in rhythm. Abdomen is soft. She good bowel sounds. There is no fluid wave. There is no palpable hepatosplenomegaly. Her exam shows a laminectomy scar in the lumbar spine. No tenderness over the spine ribs or hips. Extremities shows some trace edema in her legs. Has good strength in her legs. She has good range of motion of her  joints. Neurological exam shows some increased sensation to touch and it feet and lower legs. Skin exam no rashes.eferred  Lab Results  Component Value Date   WBC 14.3 (H) 03/27/2017   HGB 13.5 03/27/2017   HCT 41.2 03/27/2017   MCV 90 03/27/2017   PLT 417 (H) 03/27/2017   Lab Results  Component Value Date   FERRITIN 79 06/06/2012   IRON 79 09/02/2011   TIBC 421 09/02/2011   UIBC 342 09/02/2011   IRONPCTSAT 19 (L) 09/02/2011   Lab Results  Component Value Date   RBC 4.58 03/27/2017   Lab Results  Component Value Date   KPAFRELGTCHN 3.15 (H) 02/20/2015   LAMBDASER 2.45 02/20/2015   KAPLAMBRATIO 1.08 09/09/2016   Lab Results    Component Value Date   IGGSERUM 1,524 09/09/2016   IGA 100 02/20/2015   IGMSERUM 61 09/09/2016   Lab Results  Component Value Date   TOTALPROTELP 7.7 02/20/2015   ALBUMINELP 4.3 02/20/2015   A1GS 0.3 02/20/2015   A2GS 0.9 02/20/2015   BETS 0.5 02/20/2015   BETA2SER 0.3 02/20/2015   GAMS 1.3 02/20/2015   MSPIKE 0.9 (H) 10/30/2015   SPEI * 02/20/2015     Chemistry      Component Value Date/Time   NA 139 09/09/2016 1144   K 3.4 (L) 09/09/2016 1144   CL 94 (L) 02/20/2015 1010   CO2 26 09/09/2016 1144   BUN 16.8 09/09/2016 1144   CREATININE 0.9 09/09/2016 1144      Component Value Date/Time   CALCIUM 9.9 09/09/2016 1144   ALKPHOS 68 09/09/2016 1144   AST 21 09/09/2016 1144   ALT 22 09/09/2016 1144   BILITOT 0.65 09/09/2016 1144     Impression and Plan: [name] is 66 yo white female with MGUS and also chronic neuropathy due to diabetes and chronic back issues.   We did do a x-ray of her back. This was her lower back. There is nothing there does suggest any type of involvement by a malignancy. She has chronic changes from her past surgeries.   I would probably need to get her back to see [name] in about 3 months. I want to make sure that we follow her somewhat closely.     Christine Napoleon, MD 7/16/20188:32 AM

## 2017-03-28 ENCOUNTER — Telehealth: Payer: Self-pay | Admitting: *Deleted

## 2017-03-28 LAB — KAPPA/LAMBDA LIGHT CHAINS
IG LAMBDA FREE LIGHT CHAIN: 26.3 mg/L (ref 5.7–26.3)
Ig Kappa Free Light Chain: 29.5 mg/L — ABNORMAL HIGH (ref 3.3–19.4)
Kappa/Lambda FluidC Ratio: 1.12 (ref 0.26–1.65)

## 2017-03-28 NOTE — Telephone Encounter (Addendum)
Patient is aware of results   ----- Message from [name], MD sent at 03/27/2017 11:29 AM EDT ----- Call  - No fracture.  No obvious changes from her past back fusion!!!  Pete

## 2017-04-01 LAB — MULTIPLE MYELOMA PANEL, SERUM
ALBUMIN/GLOB SERPL: 1 (ref 0.7–1.7)
Albumin SerPl Elph-Mcnc: 3.7 g/dL (ref 2.9–4.4)
Alpha 1: 0.2 g/dL (ref 0.0–0.4)
Alpha2 Glob SerPl Elph-Mcnc: 1 g/dL (ref 0.4–1.0)
B-Globulin SerPl Elph-Mcnc: 1.3 g/dL (ref 0.7–1.3)
Gamma Glob SerPl Elph-Mcnc: 1.4 g/dL (ref 0.4–1.8)
Globulin, Total: 3.9 g/dL (ref 2.2–3.9)
IGM (IMMUNOGLOBIN M), SRM: 57 mg/dL (ref 26–217)
IgA, Qn, Serum: 97 mg/dL (ref 87–352)
IgG, Qn, Serum: 1438 mg/dL (ref 700–1600)
M Protein SerPl Elph-Mcnc: 0.9 g/dL — ABNORMAL HIGH
TOTAL PROTEIN: 7.6 g/dL (ref 6.0–8.5)

## 2017-04-03 ENCOUNTER — Telehealth: Payer: Self-pay | Admitting: *Deleted

## 2017-04-03 NOTE — Telephone Encounter (Addendum)
Patient is aware of results  ----- Message from [name], MD sent at 04/03/2017  9:59 AM EDT ----- Call - your abnormal protein is stable!!!  This is good!!!  pete

## 2017-05-24 DIAGNOSIS — E1140 Type 2 diabetes mellitus with diabetic neuropathy, unspecified: Secondary | ICD-10-CM | POA: Diagnosis not present

## 2017-05-24 DIAGNOSIS — Z23 Encounter for immunization: Secondary | ICD-10-CM | POA: Diagnosis not present

## 2017-05-24 DIAGNOSIS — K3184 Gastroparesis: Secondary | ICD-10-CM | POA: Diagnosis not present

## 2017-05-24 DIAGNOSIS — I10 Essential (primary) hypertension: Secondary | ICD-10-CM | POA: Diagnosis not present

## 2017-05-24 DIAGNOSIS — E782 Mixed hyperlipidemia: Secondary | ICD-10-CM | POA: Diagnosis not present

## 2017-06-09 DIAGNOSIS — L718 Other rosacea: Secondary | ICD-10-CM | POA: Diagnosis not present

## 2017-06-09 DIAGNOSIS — H01003 Unspecified blepharitis right eye, unspecified eyelid: Secondary | ICD-10-CM | POA: Diagnosis not present

## 2017-06-09 DIAGNOSIS — H1013 Acute atopic conjunctivitis, bilateral: Secondary | ICD-10-CM | POA: Diagnosis not present

## 2017-06-09 DIAGNOSIS — H04123 Dry eye syndrome of bilateral lacrimal glands: Secondary | ICD-10-CM | POA: Diagnosis not present

## 2017-06-26 DIAGNOSIS — E1140 Type 2 diabetes mellitus with diabetic neuropathy, unspecified: Secondary | ICD-10-CM | POA: Diagnosis not present

## 2017-08-07 DIAGNOSIS — E1142 Type 2 diabetes mellitus with diabetic polyneuropathy: Secondary | ICD-10-CM | POA: Diagnosis not present

## 2017-08-07 DIAGNOSIS — Z79891 Long term (current) use of opiate analgesic: Secondary | ICD-10-CM | POA: Diagnosis not present

## 2017-08-07 DIAGNOSIS — G894 Chronic pain syndrome: Secondary | ICD-10-CM | POA: Diagnosis not present

## 2017-08-07 DIAGNOSIS — M961 Postlaminectomy syndrome, not elsewhere classified: Secondary | ICD-10-CM | POA: Diagnosis not present

## 2017-08-29 DIAGNOSIS — J029 Acute pharyngitis, unspecified: Secondary | ICD-10-CM | POA: Diagnosis not present

## 2017-08-29 DIAGNOSIS — J069 Acute upper respiratory infection, unspecified: Secondary | ICD-10-CM | POA: Diagnosis not present

## 2017-08-31 DIAGNOSIS — E1142 Type 2 diabetes mellitus with diabetic polyneuropathy: Secondary | ICD-10-CM | POA: Diagnosis not present

## 2017-09-12 HISTORY — PX: BACK SURGERY: SHX140

## 2017-09-27 ENCOUNTER — Ambulatory Visit: Payer: BLUE CROSS/BLUE SHIELD | Admitting: Hematology & Oncology

## 2017-09-27 ENCOUNTER — Other Ambulatory Visit: Payer: BLUE CROSS/BLUE SHIELD

## 2017-10-16 ENCOUNTER — Inpatient Hospital Stay: Payer: BLUE CROSS/BLUE SHIELD | Admitting: Hematology & Oncology

## 2017-10-16 ENCOUNTER — Inpatient Hospital Stay: Payer: BLUE CROSS/BLUE SHIELD

## 2017-10-23 ENCOUNTER — Inpatient Hospital Stay: Payer: BLUE CROSS/BLUE SHIELD | Attending: Hematology & Oncology

## 2017-10-23 ENCOUNTER — Inpatient Hospital Stay (HOSPITAL_BASED_OUTPATIENT_CLINIC_OR_DEPARTMENT_OTHER): Payer: BLUE CROSS/BLUE SHIELD | Admitting: Hematology & Oncology

## 2017-10-23 ENCOUNTER — Other Ambulatory Visit: Payer: Self-pay

## 2017-10-23 VITALS — BP 133/58 | HR 92 | Temp 97.6°F | Resp 18 | Wt 212.1 lb

## 2017-10-23 DIAGNOSIS — D472 Monoclonal gammopathy: Secondary | ICD-10-CM | POA: Diagnosis not present

## 2017-10-23 DIAGNOSIS — E1140 Type 2 diabetes mellitus with diabetic neuropathy, unspecified: Secondary | ICD-10-CM

## 2017-10-23 DIAGNOSIS — M545 Low back pain, unspecified: Secondary | ICD-10-CM

## 2017-10-23 DIAGNOSIS — Z7984 Long term (current) use of oral hypoglycemic drugs: Secondary | ICD-10-CM | POA: Insufficient documentation

## 2017-10-23 DIAGNOSIS — Z79899 Other long term (current) drug therapy: Secondary | ICD-10-CM | POA: Insufficient documentation

## 2017-10-23 LAB — CMP (CANCER CENTER ONLY)
ALK PHOS: 70 U/L (ref 26–84)
ALT: 27 U/L (ref 0–55)
ANION GAP: 13 (ref 5–15)
AST: 27 U/L (ref 5–34)
Albumin: 3.6 g/dL (ref 3.5–5.0)
BILIRUBIN TOTAL: 0.8 mg/dL (ref 0.2–1.2)
BUN: 18 mg/dL (ref 7–22)
CALCIUM: 9.5 mg/dL (ref 8.0–10.3)
CHLORIDE: 100 mmol/L (ref 98–108)
CO2: 29 mmol/L (ref 18–33)
CREATININE: 0.9 mg/dL (ref 0.60–1.10)
Glucose, Bld: 225 mg/dL — ABNORMAL HIGH (ref 73–118)
Potassium: 3 mmol/L — CL (ref 3.3–4.7)
Sodium: 142 mmol/L (ref 128–145)
Total Protein: 8.1 g/dL (ref 6.4–8.1)

## 2017-10-23 LAB — CBC WITH DIFFERENTIAL (CANCER CENTER ONLY)
Basophils Absolute: 0.1 10*3/uL (ref 0.0–0.1)
Basophils Relative: 0 %
Eosinophils Absolute: 0.5 10*3/uL (ref 0.0–0.5)
Eosinophils Relative: 4 %
HEMATOCRIT: 39.3 % (ref 34.8–46.6)
HEMOGLOBIN: 13 g/dL (ref 11.6–15.9)
LYMPHS ABS: 5 10*3/uL — AB (ref 0.9–3.3)
LYMPHS PCT: 42 %
MCH: 29 pg (ref 26.0–34.0)
MCHC: 33.1 g/dL (ref 32.0–36.0)
MCV: 87.5 fL (ref 81.0–101.0)
MONO ABS: 1 10*3/uL — AB (ref 0.1–0.9)
MONOS PCT: 9 %
NEUTROS ABS: 5.2 10*3/uL (ref 1.5–6.5)
NEUTROS PCT: 45 %
Platelet Count: 384 10*3/uL (ref 145–400)
RBC: 4.49 MIL/uL (ref 3.70–5.32)
RDW: 13.4 % (ref 11.1–15.7)
WBC Count: 11.7 10*3/uL — ABNORMAL HIGH (ref 3.9–10.0)

## 2017-10-23 MED ORDER — GABAPENTIN 300 MG PO CAPS: 300.0000 mg | ORAL_CAPSULE | Freq: Three times a day (TID) | ORAL | 3 refills | Status: DC

## 2017-10-23 NOTE — Progress Notes (Signed)
Hematology and Oncology Follow Up Visit  [name] [id number] 30-Apr-1951 67 y.o. 10/23/2017   Principle Diagnosis:  1. IgG Kappa MGUS. 2. Chronic neuropathy  Current Therapy:   Observation  Interim History:  [name] is here today for a follow-up.  She is still dealing with neuropathy.  This is been her biggest issue.  Is hard to say whether the neuropathy is from her diabetes, which is poorly controlled, or from the MGUS.  She has never been on gabapentin.  I asked her several times if she had been on gabapentin or Neurontin.  She says she does not think so.  As such, we will try her on gabapentin at 300 mg p.o. 3 times daily.  We last saw her back in July, her monoclonal studies showed an M spike of 0.9 g/dL.  Her IgG level was 1440 mg/dL.  Her kappa light chain was 3 mg/dL.  I have thought about possibly treating the MGUS.  I note that this is a "gray area" regarding treatment.  However, her life really is dictated by this neuropathy.  She really is not able to do all that much.  She, again, does not have well-controlled diabetes.  She has had no fever.  She is had no bleeding.  She is had no problems with bowels or bladder.  Overall, her performance status is ECOG 2.  Medications:  Allergies as of 10/23/2017   No Known Allergies     Medication List        Accurate as of 10/23/17  4:05 PM. Always use your most recent med list.          cyclobenzaprine 5 MG tablet Commonly known as:  FLEXERIL Take 1 tablet (5 mg total) by mouth at bedtime as needed for muscle spasms.   gabapentin 300 MG capsule Commonly known as:  NEURONTIN Take 1 capsule (300 mg total) by mouth 3 (three) times daily.   ibuprofen 800 MG tablet Commonly known as:  ADVIL,MOTRIN   metFORMIN 500 MG 24 hr tablet Commonly known as:  GLUCOPHAGE-XR Take 1,000 mg by mouth Twice daily. Total of 2,000 mg   metoprolol succinate 100 MG 24 hr tablet Commonly known as:  TOPROL-XL Take 100 mg by  mouth daily.   morphine 60 MG 12 hr tablet Commonly known as:  MS CONTIN Take 60 mg by mouth every 12 (twelve) hours.   pravastatin 40 MG tablet Commonly known as:  PRAVACHOL Take 40 mg by mouth daily.   valsartan-hydrochlorothiazide 160-12.5 MG tablet Commonly known as:  DIOVAN-HCT Take 1 tablet by mouth daily.   venlafaxine XR 150 MG 24 hr capsule Commonly known as:  EFFEXOR-XR Take 150 mg by mouth daily with breakfast.       Allergies: No Known Allergies  Past Medical History, Surgical history, Social history, and Family History were reviewed and updated.  Review of Systems: Review of Systems  Constitutional: Positive for malaise/fatigue.  HENT: Negative.   Eyes: Negative.   Respiratory: Positive for shortness of breath.   Cardiovascular: Positive for palpitations and leg swelling.  Gastrointestinal: Positive for constipation and heartburn.  Genitourinary: Negative.   Musculoskeletal: Positive for joint pain and myalgias.  Skin: Negative.   Neurological: Positive for tingling.  Endo/Heme/Allergies: Negative.   Psychiatric/Behavioral: Negative.      Physical Exam:  weight is 212 lb 1.9 oz (96.2 kg). Her oral temperature is 97.6 F (36.4 C). Her blood pressure is 133/58 (abnormal) and her pulse is 92. Her respiration is  18 and oxygen saturation is 99%.   Wt Readings from Last 3 Encounters:  10/23/17 212 lb 1.9 oz (96.2 kg)  03/27/17 212 lb (96.2 kg)  09/09/16 212 lb 6.4 oz (96.3 kg)    Physical Exam  Constitutional: She is oriented to person, place, and time.  HENT:  Head: Normocephalic and atraumatic.  Mouth/Throat: Oropharynx is clear and moist.  Eyes: EOM are normal. Pupils are equal, round, and reactive to light.  Neck: Normal range of motion.  Cardiovascular: Normal rate, regular rhythm and normal heart sounds.  Pulmonary/Chest: Effort normal and breath sounds normal.  Abdominal: Soft. Bowel sounds are normal.  Musculoskeletal: Normal range of  motion. She exhibits no edema, tenderness or deformity.  Lymphadenopathy:    She has no cervical adenopathy.  Neurological: She is alert and oriented to person, place, and time.  Skin: Skin is warm and dry. No rash noted. No erythema.  Psychiatric: She has a normal mood and affect. Her behavior is normal. Judgment and thought content normal.  Vitals reviewed.   Lab Results  Component Value Date   WBC 11.7 (H) 10/23/2017   HGB 13.5 03/27/2017   HCT 39.3 10/23/2017   MCV 87.5 10/23/2017   PLT 384 10/23/2017   Lab Results  Component Value Date   FERRITIN 79 06/06/2012   IRON 79 09/02/2011   TIBC 421 09/02/2011   UIBC 342 09/02/2011   IRONPCTSAT 19 (L) 09/02/2011   Lab Results  Component Value Date   RBC 4.49 10/23/2017   Lab Results  Component Value Date   KPAFRELGTCHN 3.15 (H) 02/20/2015   LAMBDASER 2.45 02/20/2015   KAPLAMBRATIO 1.12 03/27/2017   Lab Results  Component Value Date   IGGSERUM 1,438 03/27/2017   IGA 100 02/20/2015   IGMSERUM 57 03/27/2017   Lab Results  Component Value Date   TOTALPROTELP 7.7 02/20/2015   ALBUMINELP 4.3 02/20/2015   A1GS 0.3 02/20/2015   A2GS 0.9 02/20/2015   BETS 0.5 02/20/2015   BETA2SER 0.3 02/20/2015   GAMS 1.3 02/20/2015   MSPIKE 0.9 (H) 10/30/2015   SPEI * 02/20/2015     Chemistry      Component Value Date/Time   NA 142 10/23/2017 1413   NA 140 03/27/2017 0804   K 3.0 (LL) 10/23/2017 1413   K 3.7 03/27/2017 0804   CL 100 10/23/2017 1413   CL 94 (L) 02/20/2015 1010   CO2 29 10/23/2017 1413   CO2 28 03/27/2017 0804   BUN 18 10/23/2017 1413   BUN 14.9 03/27/2017 0804   CREATININE 0.90 10/23/2017 1413   CREATININE 0.9 03/27/2017 0804      Component Value Date/Time   CALCIUM 9.5 10/23/2017 1413   CALCIUM 10.0 03/27/2017 0804   ALKPHOS 70 10/23/2017 1413   ALKPHOS 69 03/27/2017 0804   AST 27 10/23/2017 1413   AST 26 03/27/2017 0804   ALT 27 10/23/2017 1413   ALT 28 03/27/2017 0804   BILITOT 0.8 10/23/2017 1413    BILITOT 0.76 03/27/2017 0804     Impression and Plan: [name] is 67 yo white female with MGUS and also chronic neuropathy due to diabetes and chronic back issues.   We will see what her monoclonal studies show.  I will plan to get her back in 6 weeks.  I will see how the gabapentin does.  If she is not feeling better, then I will try her on Velcade.  Again, I know this is not definitive therapy.  However, there are have  been some studies which have shown some improvement in quality of life.  I talked to Ms. Holford about this for over 30 minutes.  Over half the time was spent face-to-face with her.  I reviewed her labs.  I went over my recommendations.  I prescribed the gabapentin. [name], MD 2/11/20194:05 PM

## 2017-10-24 LAB — KAPPA/LAMBDA LIGHT CHAINS
Kappa free light chain: 33 mg/L — ABNORMAL HIGH (ref 3.3–19.4)
Kappa, lambda light chain ratio: 1.29 (ref 0.26–1.65)
LAMDA FREE LIGHT CHAINS: 25.5 mg/L (ref 5.7–26.3)

## 2017-10-24 LAB — IGG, IGA, IGM
IGG (IMMUNOGLOBIN G), SERUM: 1591 mg/dL (ref 700–1600)
IgA: 91 mg/dL (ref 87–352)
IgM (Immunoglobulin M), Srm: 56 mg/dL (ref 26–217)

## 2017-10-27 LAB — PROTEIN ELECTROPHORESIS, SERUM, WITH REFLEX
A/G Ratio: 0.9 (ref 0.7–1.7)
Albumin ELP: 3.5 g/dL (ref 2.9–4.4)
Alpha-1-Globulin: 0.2 g/dL (ref 0.0–0.4)
Alpha-2-Globulin: 0.9 g/dL (ref 0.4–1.0)
Beta Globulin: 1.3 g/dL (ref 0.7–1.3)
GLOBULIN, TOTAL: 4 g/dL — AB (ref 2.2–3.9)
Gamma Globulin: 1.5 g/dL (ref 0.4–1.8)
M-Spike, %: 1 g/dL — ABNORMAL HIGH
SPEP INTERP: 0
Total Protein ELP: 7.5 g/dL (ref 6.0–8.5)

## 2017-10-30 DIAGNOSIS — G8929 Other chronic pain: Secondary | ICD-10-CM | POA: Diagnosis not present

## 2017-10-30 DIAGNOSIS — G894 Chronic pain syndrome: Secondary | ICD-10-CM | POA: Diagnosis not present

## 2017-10-30 DIAGNOSIS — E1142 Type 2 diabetes mellitus with diabetic polyneuropathy: Secondary | ICD-10-CM | POA: Diagnosis not present

## 2017-10-30 DIAGNOSIS — Z79891 Long term (current) use of opiate analgesic: Secondary | ICD-10-CM | POA: Diagnosis not present

## 2017-10-30 DIAGNOSIS — M961 Postlaminectomy syndrome, not elsewhere classified: Secondary | ICD-10-CM | POA: Diagnosis not present

## 2017-11-13 DIAGNOSIS — H2000 Unspecified acute and subacute iridocyclitis: Secondary | ICD-10-CM | POA: Diagnosis not present

## 2017-11-21 DIAGNOSIS — E1140 Type 2 diabetes mellitus with diabetic neuropathy, unspecified: Secondary | ICD-10-CM | POA: Diagnosis not present

## 2017-11-21 DIAGNOSIS — E559 Vitamin D deficiency, unspecified: Secondary | ICD-10-CM | POA: Diagnosis not present

## 2017-11-21 DIAGNOSIS — I10 Essential (primary) hypertension: Secondary | ICD-10-CM | POA: Diagnosis not present

## 2017-11-21 DIAGNOSIS — J4520 Mild intermittent asthma, uncomplicated: Secondary | ICD-10-CM | POA: Diagnosis not present

## 2017-11-21 DIAGNOSIS — E782 Mixed hyperlipidemia: Secondary | ICD-10-CM | POA: Diagnosis not present

## 2017-11-22 ENCOUNTER — Other Ambulatory Visit: Payer: Self-pay | Admitting: Family Medicine

## 2017-11-22 DIAGNOSIS — R209 Unspecified disturbances of skin sensation: Secondary | ICD-10-CM

## 2017-11-24 DIAGNOSIS — H2000 Unspecified acute and subacute iridocyclitis: Secondary | ICD-10-CM | POA: Diagnosis not present

## 2017-11-27 ENCOUNTER — Ambulatory Visit
Admission: RE | Admit: 2017-11-27 | Discharge: 2017-11-27 | Disposition: A | Discharge status: Home / Self Care | Payer: BLUE CROSS/BLUE SHIELD | Source: Ambulatory Visit | Attending: Family Medicine | Admitting: Family Medicine

## 2017-11-27 DIAGNOSIS — R209 Unspecified disturbances of skin sensation: Secondary | ICD-10-CM | POA: Diagnosis not present

## 2017-11-28 ENCOUNTER — Telehealth: Payer: Self-pay

## 2017-11-28 NOTE — Telephone Encounter (Signed)
Patient called to say she hasn't been tolerating the Neurontin Dr. Marin Olp previously prescribed for her.  She asked about going down to one tablet at night, and wants to know if she should keep her follow up appointment on 3-25 as scheduled. Per Dr. Marin Olp,  Please try taking one tab at night and do plan to come for your regularly scheduled appointment on 3-25.

## 2017-12-04 ENCOUNTER — Inpatient Hospital Stay: Payer: BLUE CROSS/BLUE SHIELD | Attending: Hematology & Oncology | Admitting: Hematology & Oncology

## 2017-12-04 ENCOUNTER — Inpatient Hospital Stay: Payer: BLUE CROSS/BLUE SHIELD

## 2017-12-04 ENCOUNTER — Encounter: Payer: Self-pay | Admitting: Hematology & Oncology

## 2017-12-04 DIAGNOSIS — K5900 Constipation, unspecified: Secondary | ICD-10-CM | POA: Diagnosis not present

## 2017-12-04 DIAGNOSIS — R530 Neoplastic (malignant) related fatigue: Secondary | ICD-10-CM | POA: Insufficient documentation

## 2017-12-04 DIAGNOSIS — D472 Monoclonal gammopathy: Secondary | ICD-10-CM | POA: Diagnosis not present

## 2017-12-04 DIAGNOSIS — Z79899 Other long term (current) drug therapy: Secondary | ICD-10-CM | POA: Diagnosis not present

## 2017-12-04 DIAGNOSIS — E1140 Type 2 diabetes mellitus with diabetic neuropathy, unspecified: Secondary | ICD-10-CM | POA: Diagnosis not present

## 2017-12-04 DIAGNOSIS — Z7984 Long term (current) use of oral hypoglycemic drugs: Secondary | ICD-10-CM | POA: Insufficient documentation

## 2017-12-04 DIAGNOSIS — E1121 Type 2 diabetes mellitus with diabetic nephropathy: Secondary | ICD-10-CM

## 2017-12-04 DIAGNOSIS — C9000 Multiple myeloma not having achieved remission: Secondary | ICD-10-CM | POA: Insufficient documentation

## 2017-12-04 DIAGNOSIS — M549 Dorsalgia, unspecified: Secondary | ICD-10-CM | POA: Diagnosis not present

## 2017-12-04 HISTORY — DX: Monoclonal gammopathy: D47.2

## 2017-12-04 HISTORY — DX: Multiple myeloma not having achieved remission: C90.00

## 2017-12-04 LAB — CBC WITH DIFFERENTIAL (CANCER CENTER ONLY)
BASOS ABS: 0.1 10*3/uL (ref 0.0–0.1)
Basophils Relative: 1 %
EOS PCT: 5 %
Eosinophils Absolute: 0.7 10*3/uL — ABNORMAL HIGH (ref 0.0–0.5)
HCT: 41.3 % (ref 34.8–46.6)
Hemoglobin: 13.1 g/dL (ref 11.6–15.9)
LYMPHS PCT: 44 %
Lymphs Abs: 5.8 10*3/uL — ABNORMAL HIGH (ref 0.9–3.3)
MCH: 28.5 pg (ref 26.0–34.0)
MCHC: 31.7 g/dL — ABNORMAL LOW (ref 32.0–36.0)
MCV: 89.8 fL (ref 81.0–101.0)
MONO ABS: 0.9 10*3/uL (ref 0.1–0.9)
Monocytes Relative: 7 %
Neutro Abs: 5.6 10*3/uL (ref 1.5–6.5)
Neutrophils Relative %: 43 %
PLATELETS: 458 10*3/uL — AB (ref 145–400)
RBC: 4.6 MIL/uL (ref 3.70–5.32)
RDW: 13.4 % (ref 11.1–15.7)
WBC Count: 13 10*3/uL — ABNORMAL HIGH (ref 3.9–10.0)

## 2017-12-04 LAB — CMP (CANCER CENTER ONLY)
ALT: 32 U/L (ref 10–47)
ANION GAP: 9 (ref 5–15)
AST: 28 U/L (ref 11–38)
Albumin: 3.7 g/dL (ref 3.5–5.0)
Alkaline Phosphatase: 70 U/L (ref 26–84)
BUN: 16 mg/dL (ref 7–22)
CO2: 33 mmol/L (ref 18–33)
Calcium: 9.4 mg/dL (ref 8.0–10.3)
Chloride: 102 mmol/L (ref 98–108)
Creatinine: 0.9 mg/dL (ref 0.60–1.20)
GLUCOSE: 164 mg/dL — AB (ref 73–118)
POTASSIUM: 3.4 mmol/L (ref 3.3–4.7)
Sodium: 144 mmol/L (ref 128–145)
Total Bilirubin: 0.8 mg/dL (ref 0.2–1.6)
Total Protein: 8 g/dL (ref 6.4–8.1)

## 2017-12-04 MED ORDER — FAMCICLOVIR 500 MG PO TABS: 500.0000 mg | ORAL_TABLET | Freq: Every day | ORAL | 6 refills | Status: DC

## 2017-12-04 NOTE — Progress Notes (Signed)
START OFF PATHWAY REGIMEN - Multiple Myeloma and Other Plasma Cell Dyscrasias   [id number]:Bortezomib (SQ) weekly + Dexamethasone weekly q35 days:   A cycle is every 35 days:     Bortezomib      Dexamethasone   **Always confirm dose/schedule in your pharmacy ordering system**    Patient Characteristics: Smoldering Myeloma R-ISS Staging: Not Applicable Disease Classification: Smoldering Myeloma Intent of Therapy: Non-Curative / Palliative Intent, Discussed with Patient

## 2017-12-04 NOTE — Progress Notes (Signed)
Hematology and Oncology Follow Up Visit  [name] [id number] 1951-06-29 67 y.o. 12/04/2017   Principle Diagnosis:  1. IgG Kappa smoldering myeloma 2. Chronic neuropathy  Current Therapy:   Observation  Interim History:  [name] is here today for a follow-up.  She is still dealing with neuropathy.  This is been her biggest issue.  Is hard to say whether the neuropathy is from her diabetes, which is poorly controlled, or from the smoldering myeloma  At this point, I think our options now are to consider her for therapy.  She is just not getting better.  The neuropathy is getting worse.  We did do myeloma studies more last saw her in February.  Her M spike was 1 g/dL.  Her IgG level was 1600 mg/dL.  Her kappa light chain was 3.3 mg/dL.  I told her that I did not know if treatment would work.  However, given that this is a "gray area" for therapy with smoldering myeloma, I thought it would be worthwhile trying.  She agrees to try.  Again, I just do not know if this treatment would work.  I would use subcutaneous Velcade.  I would give her 3 cycles of Velcade.  If we do not see an improvement in her neuropathy, then I would stop at that point.  She still dealing with her diabetes.  She still has a lot of back issues from multiple back surgeries.  Overall, her performance status is ECOG 2.  Medications:  Allergies as of 12/04/2017   No Known Allergies     Medication List        Accurate as of 12/04/17  6:15 PM. Always use your most recent med list.          cyclobenzaprine 5 MG tablet Commonly known as:  FLEXERIL Take 1 tablet (5 mg total) by mouth at bedtime as needed for muscle spasms.   famciclovir 500 MG tablet Commonly known as:  FAMVIR Take 1 tablet (500 mg total) by mouth daily.   gabapentin 300 MG capsule Commonly known as:  NEURONTIN Take 1 capsule (300 mg total) by mouth 3 (three) times daily.   ibuprofen 800 MG tablet Commonly known as:   ADVIL,MOTRIN   metFORMIN 500 MG 24 hr tablet Commonly known as:  GLUCOPHAGE-XR Take 1,000 mg by mouth Twice daily. Total of 2,000 mg   metoprolol succinate 100 MG 24 hr tablet Commonly known as:  TOPROL-XL Take 100 mg by mouth daily.   morphine 60 MG 12 hr tablet Commonly known as:  MS CONTIN Take 60 mg by mouth every 12 (twelve) hours.   pravastatin 40 MG tablet Commonly known as:  PRAVACHOL Take 40 mg by mouth daily.   valsartan-hydrochlorothiazide 160-12.5 MG tablet Commonly known as:  DIOVAN-HCT Take 1 tablet by mouth daily.   venlafaxine XR 150 MG 24 hr capsule Commonly known as:  EFFEXOR-XR Take 150 mg by mouth daily with breakfast.       Allergies: No Known Allergies  Past Medical History, Surgical history, Social history, and Family History were reviewed and updated.  Review of Systems: Review of Systems  Constitutional: Positive for malaise/fatigue.  HENT: Negative.   Eyes: Negative.   Respiratory: Positive for shortness of breath.   Cardiovascular: Positive for palpitations and leg swelling.  Gastrointestinal: Positive for constipation and heartburn.  Genitourinary: Negative.   Musculoskeletal: Positive for joint pain and myalgias.  Skin: Negative.   Neurological: Positive for tingling.  Endo/Heme/Allergies: Negative.   Psychiatric/Behavioral:  Negative.      Physical Exam:  weight is 213 lb (96.6 kg). Her oral temperature is 98.1 F (36.7 C). Her blood pressure is 155/70 (abnormal) and her pulse is 91. Her respiration is 18 and oxygen saturation is 98%.   Wt Readings from Last 3 Encounters:  12/04/17 213 lb (96.6 kg)  10/23/17 212 lb 1.9 oz (96.2 kg)  03/27/17 212 lb (96.2 kg)    Physical Exam  Constitutional: She is oriented to person, place, and time.  HENT:  Head: Normocephalic and atraumatic.  Mouth/Throat: Oropharynx is clear and moist.  Eyes: Pupils are equal, round, and reactive to light. EOM are normal.  Neck: Normal range of motion.   Cardiovascular: Normal rate, regular rhythm and normal heart sounds.  Pulmonary/Chest: Effort normal and breath sounds normal.  Abdominal: Soft. Bowel sounds are normal.  Musculoskeletal: Normal range of motion. She exhibits no edema, tenderness or deformity.  Lymphadenopathy:    She has no cervical adenopathy.  Neurological: She is alert and oriented to person, place, and time.  Skin: Skin is warm and dry. No rash noted. No erythema.  Psychiatric: She has a normal mood and affect. Her behavior is normal. Judgment and thought content normal.  Vitals reviewed.   Lab Results  Component Value Date   WBC 13.0 (H) 12/04/2017   HGB 13.5 03/27/2017   HCT 41.3 12/04/2017   MCV 89.8 12/04/2017   PLT 458 (H) 12/04/2017   Lab Results  Component Value Date   FERRITIN 79 06/06/2012   IRON 79 09/02/2011   TIBC 421 09/02/2011   UIBC 342 09/02/2011   IRONPCTSAT 19 (L) 09/02/2011   Lab Results  Component Value Date   RBC 4.60 12/04/2017   Lab Results  Component Value Date   KPAFRELGTCHN 33.0 (H) 10/23/2017   LAMBDASER 25.5 10/23/2017   KAPLAMBRATIO 1.29 10/23/2017   Lab Results  Component Value Date   IGGSERUM 1,591 10/23/2017   IGA 91 10/23/2017   IGMSERUM 56 10/23/2017   Lab Results  Component Value Date   TOTALPROTELP 7.5 10/23/2017   ALBUMINELP 3.5 10/23/2017   A1GS 0.2 10/23/2017   A2GS 0.9 10/23/2017   BETS 1.3 10/23/2017   BETA2SER 0.3 02/20/2015   GAMS 1.5 10/23/2017   MSPIKE 1.0 (H) 10/23/2017   SPEI * 02/20/2015     Chemistry      Component Value Date/Time   NA 144 12/04/2017 1503   NA 140 03/27/2017 0804   K 3.4 12/04/2017 1503   K 3.7 03/27/2017 0804   CL 102 12/04/2017 1503   CL 94 (L) 02/20/2015 1010   CO2 33 12/04/2017 1503   CO2 28 03/27/2017 0804   BUN 16 12/04/2017 1503   BUN 14.9 03/27/2017 0804   CREATININE 0.90 12/04/2017 1503   CREATININE 0.9 03/27/2017 0804      Component Value Date/Time   CALCIUM 9.4 12/04/2017 1503   CALCIUM 10.0  03/27/2017 0804   ALKPHOS 70 12/04/2017 1503   ALKPHOS 69 03/27/2017 0804   AST 28 12/04/2017 1503   AST 26 03/27/2017 0804   ALT 32 12/04/2017 1503   ALT 28 03/27/2017 0804   BILITOT 0.8 12/04/2017 1503   BILITOT 0.76 03/27/2017 0804     Impression and Plan: [name] is 67 yo white female with smoldering myeloma and also chronic neuropathy due to diabetes and chronic back issues.   I talked to her for about 40 minutes.  Over half the time was spent face-to-face.  I reviewed  all of the options.  I went over the toxicities of Velcade.  I made my recommendation that we try her for 3 cycles.  We will check her myeloma panel after each cycle and see if there was any improvement.  If after 3 cycles we do not see that her neuropathy is improved, then I would just stop treatment.  She is agreeable to this.  We will start next week.  I will plan to see her back in about a month.    [name], MD 3/25/20196:15 PM

## 2017-12-05 LAB — IGG, IGA, IGM
IgA: 80 mg/dL — ABNORMAL LOW (ref 87–352)
IgG (Immunoglobin G), Serum: 1496 mg/dL (ref 700–1600)
IgM (Immunoglobulin M), Srm: 46 mg/dL (ref 26–217)

## 2017-12-05 LAB — BETA 2 MICROGLOBULIN, SERUM: Beta-2 Microglobulin: 2.1 mg/L (ref 0.6–2.4)

## 2017-12-05 LAB — KAPPA/LAMBDA LIGHT CHAINS
KAPPA, LAMDA LIGHT CHAIN RATIO: 1.48 (ref 0.26–1.65)
Kappa free light chain: 36.9 mg/L — ABNORMAL HIGH (ref 3.3–19.4)
Lambda free light chains: 24.9 mg/L (ref 5.7–26.3)

## 2017-12-06 ENCOUNTER — Other Ambulatory Visit: Payer: Self-pay | Admitting: *Deleted

## 2017-12-06 DIAGNOSIS — C9000 Multiple myeloma not having achieved remission: Secondary | ICD-10-CM

## 2017-12-06 DIAGNOSIS — D472 Monoclonal gammopathy: Secondary | ICD-10-CM

## 2017-12-06 MED ORDER — PROCHLORPERAZINE MALEATE 10 MG PO TABS: 10.0000 mg | ORAL_TABLET | Freq: Four times a day (QID) | ORAL | 1 refills | Status: DC | PRN

## 2017-12-06 MED ORDER — ONDANSETRON HCL 8 MG PO TABS: 8.0000 mg | ORAL_TABLET | Freq: Two times a day (BID) | ORAL | 1 refills | Status: DC | PRN

## 2017-12-07 ENCOUNTER — Other Ambulatory Visit: Payer: BLUE CROSS/BLUE SHIELD

## 2017-12-07 LAB — PROTEIN ELECTROPHORESIS, SERUM, WITH REFLEX
A/G RATIO SPE: 0.9 (ref 0.7–1.7)
ALBUMIN ELP: 3.6 g/dL (ref 2.9–4.4)
Alpha-1-Globulin: 0.2 g/dL (ref 0.0–0.4)
Alpha-2-Globulin: 1 g/dL (ref 0.4–1.0)
Beta Globulin: 1.3 g/dL (ref 0.7–1.3)
GAMMA GLOBULIN: 1.5 g/dL (ref 0.4–1.8)
Globulin, Total: 4 g/dL — ABNORMAL HIGH (ref 2.2–3.9)
M-Spike, %: 1 g/dL — ABNORMAL HIGH
SPEP INTERP: 0
TOTAL PROTEIN ELP: 7.6 g/dL (ref 6.0–8.5)

## 2017-12-08 ENCOUNTER — Inpatient Hospital Stay: Payer: BLUE CROSS/BLUE SHIELD

## 2017-12-08 ENCOUNTER — Telehealth: Payer: Self-pay | Admitting: *Deleted

## 2017-12-08 NOTE — Telephone Encounter (Addendum)
Patient is aware of results  ----- Message from [name], MD sent at 12/07/2017  5:02 PM EDT ----- Call - the abnormal protein level is still 1.0.  This is holding stable!!  Laurey Arrow

## 2017-12-11 ENCOUNTER — Other Ambulatory Visit: Payer: Self-pay

## 2017-12-11 ENCOUNTER — Inpatient Hospital Stay: Payer: BLUE CROSS/BLUE SHIELD | Attending: Hematology & Oncology

## 2017-12-11 VITALS — BP 145/71 | HR 88 | Temp 97.9°F | Resp 20

## 2017-12-11 DIAGNOSIS — Z5112 Encounter for antineoplastic immunotherapy: Secondary | ICD-10-CM | POA: Insufficient documentation

## 2017-12-11 DIAGNOSIS — Z79899 Other long term (current) drug therapy: Secondary | ICD-10-CM | POA: Diagnosis not present

## 2017-12-11 DIAGNOSIS — C9000 Multiple myeloma not having achieved remission: Secondary | ICD-10-CM

## 2017-12-11 DIAGNOSIS — Z7984 Long term (current) use of oral hypoglycemic drugs: Secondary | ICD-10-CM | POA: Diagnosis not present

## 2017-12-11 DIAGNOSIS — E1121 Type 2 diabetes mellitus with diabetic nephropathy: Secondary | ICD-10-CM | POA: Diagnosis not present

## 2017-12-11 DIAGNOSIS — D472 Monoclonal gammopathy: Secondary | ICD-10-CM

## 2017-12-11 MED ORDER — BORTEZOMIB CHEMO SQ INJECTION 3.5 MG (2.5MG/ML)
1.3000 mg/m2 | Freq: Once | INTRAMUSCULAR | Status: AC
  Administered 2017-12-11: 2.75 mg via SUBCUTANEOUS
  Filled 2017-12-11: qty 2.75

## 2017-12-11 MED ORDER — PROCHLORPERAZINE MALEATE 10 MG PO TABS
ORAL_TABLET | ORAL | Status: AC
  Filled 2017-12-11: qty 1

## 2017-12-11 MED ORDER — PROCHLORPERAZINE MALEATE 10 MG PO TABS
10.0000 mg | ORAL_TABLET | Freq: Once | ORAL | Status: AC
  Administered 2017-12-11: 10 mg via ORAL

## 2017-12-12 ENCOUNTER — Encounter: Payer: Self-pay | Admitting: Hematology & Oncology

## 2017-12-13 ENCOUNTER — Telehealth: Payer: Self-pay | Admitting: *Deleted

## 2017-12-13 NOTE — Telephone Encounter (Signed)
Called patient to see how she is doing post chemo.  Left message on patient personal voice mail to call us back if she has any problems.

## 2017-12-18 ENCOUNTER — Inpatient Hospital Stay: Payer: BLUE CROSS/BLUE SHIELD

## 2017-12-18 VITALS — BP 100/87 | HR 90 | Temp 98.1°F | Resp 18 | Wt 211.5 lb

## 2017-12-18 DIAGNOSIS — Z5112 Encounter for antineoplastic immunotherapy: Secondary | ICD-10-CM | POA: Diagnosis not present

## 2017-12-18 DIAGNOSIS — E1121 Type 2 diabetes mellitus with diabetic nephropathy: Secondary | ICD-10-CM | POA: Diagnosis not present

## 2017-12-18 DIAGNOSIS — Z7984 Long term (current) use of oral hypoglycemic drugs: Secondary | ICD-10-CM | POA: Diagnosis not present

## 2017-12-18 DIAGNOSIS — C9000 Multiple myeloma not having achieved remission: Secondary | ICD-10-CM | POA: Diagnosis not present

## 2017-12-18 DIAGNOSIS — Z79899 Other long term (current) drug therapy: Secondary | ICD-10-CM | POA: Diagnosis not present

## 2017-12-18 DIAGNOSIS — D472 Monoclonal gammopathy: Secondary | ICD-10-CM

## 2017-12-18 MED ORDER — PROCHLORPERAZINE MALEATE 10 MG PO TABS
10.0000 mg | ORAL_TABLET | Freq: Once | ORAL | Status: AC
  Administered 2017-12-18: 10 mg via ORAL

## 2017-12-18 MED ORDER — PROCHLORPERAZINE MALEATE 10 MG PO TABS
ORAL_TABLET | ORAL | Status: AC
  Filled 2017-12-18: qty 1

## 2017-12-18 MED ORDER — BORTEZOMIB CHEMO SQ INJECTION 3.5 MG (2.5MG/ML)
1.3000 mg/m2 | Freq: Once | INTRAMUSCULAR | Status: AC
  Administered 2017-12-18: 2.75 mg via SUBCUTANEOUS
  Filled 2017-12-18: qty 2.75

## 2017-12-18 NOTE — Progress Notes (Signed)
OK to treat with NO lab work today per MD [employer]

## 2017-12-20 ENCOUNTER — Encounter: Payer: Self-pay | Admitting: General Practice

## 2017-12-20 NOTE — Progress Notes (Signed)
[facility] Spiritual Care Note  Received and returned vm from [place], encouraging callback.   [place], [place], [facility] Pager [phone] Voicemail [phone]

## 2017-12-22 ENCOUNTER — Encounter: Payer: Self-pay | Admitting: General Practice

## 2017-12-22 NOTE — Progress Notes (Signed)
[facility] Note  Spoke with Giannamarie by phone, setting up 1:1 Spiritual Care appt in my office on Tuesday 4/16 at 3pm. Provided emotional support, normalizing feelings, affirming strengths, and encouraging utilization of varied support resources.   [place], [place], [facility] Pager [phone] Voicemail [phone]

## 2017-12-25 ENCOUNTER — Other Ambulatory Visit: Payer: Self-pay

## 2017-12-25 ENCOUNTER — Inpatient Hospital Stay: Payer: BLUE CROSS/BLUE SHIELD

## 2017-12-25 VITALS — BP 127/60 | HR 83 | Temp 98.5°F | Resp 18 | Wt 211.2 lb

## 2017-12-25 DIAGNOSIS — Z79899 Other long term (current) drug therapy: Secondary | ICD-10-CM | POA: Diagnosis not present

## 2017-12-25 DIAGNOSIS — C9000 Multiple myeloma not having achieved remission: Secondary | ICD-10-CM

## 2017-12-25 DIAGNOSIS — Z7984 Long term (current) use of oral hypoglycemic drugs: Secondary | ICD-10-CM | POA: Diagnosis not present

## 2017-12-25 DIAGNOSIS — E1121 Type 2 diabetes mellitus with diabetic nephropathy: Secondary | ICD-10-CM | POA: Diagnosis not present

## 2017-12-25 DIAGNOSIS — D472 Monoclonal gammopathy: Secondary | ICD-10-CM

## 2017-12-25 DIAGNOSIS — Z5112 Encounter for antineoplastic immunotherapy: Secondary | ICD-10-CM | POA: Diagnosis not present

## 2017-12-25 MED ORDER — PROCHLORPERAZINE MALEATE 10 MG PO TABS
ORAL_TABLET | ORAL | Status: AC
  Filled 2017-12-25: qty 1

## 2017-12-25 MED ORDER — BORTEZOMIB CHEMO SQ INJECTION 3.5 MG (2.5MG/ML)
1.3000 mg/m2 | Freq: Once | INTRAMUSCULAR | Status: AC
  Administered 2017-12-25: 2.75 mg via SUBCUTANEOUS
  Filled 2017-12-25: qty 2.75

## 2017-12-25 MED ORDER — PROCHLORPERAZINE MALEATE 10 MG PO TABS
10.0000 mg | ORAL_TABLET | Freq: Once | ORAL | Status: AC
  Administered 2017-12-25: 10 mg via ORAL

## 2017-12-25 NOTE — Patient Instructions (Signed)

## 2017-12-25 NOTE — Progress Notes (Signed)
Ok to treat with labs from 12/04/2017 per Dr. Marin Olp.

## 2017-12-26 ENCOUNTER — Encounter: Payer: Self-pay | Admitting: General Practice

## 2017-12-26 NOTE — Progress Notes (Signed)
[facility] Spiritual Care Note  Rescheduled today's appt to Monday 4/22 at 3pm per pt request.   Chaplain Lorrin Jackson, [place], [facility] Pager [phone] Voicemail [phone]

## 2018-01-01 ENCOUNTER — Encounter: Payer: Self-pay | Admitting: General Practice

## 2018-01-01 NOTE — Progress Notes (Signed)
[facility] Spiritual Care Note  Pt LVM to postpone appt until she can receive help from Dr Marin Olp to mitigate nausea. Await her return call.   [place], [place], [facility] Pager [phone] Voicemail [phone]

## 2018-01-08 ENCOUNTER — Inpatient Hospital Stay: Payer: BLUE CROSS/BLUE SHIELD

## 2018-01-08 ENCOUNTER — Other Ambulatory Visit: Payer: Self-pay

## 2018-01-08 ENCOUNTER — Inpatient Hospital Stay (HOSPITAL_BASED_OUTPATIENT_CLINIC_OR_DEPARTMENT_OTHER): Payer: BLUE CROSS/BLUE SHIELD | Admitting: Hematology & Oncology

## 2018-01-08 VITALS — BP 139/74 | HR 94 | Temp 98.2°F | Resp 18 | Wt 208.0 lb

## 2018-01-08 DIAGNOSIS — Z7984 Long term (current) use of oral hypoglycemic drugs: Secondary | ICD-10-CM | POA: Diagnosis not present

## 2018-01-08 DIAGNOSIS — E1121 Type 2 diabetes mellitus with diabetic nephropathy: Secondary | ICD-10-CM

## 2018-01-08 DIAGNOSIS — C9000 Multiple myeloma not having achieved remission: Secondary | ICD-10-CM

## 2018-01-08 DIAGNOSIS — D472 Monoclonal gammopathy: Secondary | ICD-10-CM

## 2018-01-08 DIAGNOSIS — Z79899 Other long term (current) drug therapy: Secondary | ICD-10-CM

## 2018-01-08 DIAGNOSIS — Z5112 Encounter for antineoplastic immunotherapy: Secondary | ICD-10-CM | POA: Diagnosis not present

## 2018-01-08 LAB — CMP (CANCER CENTER ONLY)
ALT: 30 U/L (ref 10–47)
AST: 36 U/L (ref 11–38)
Albumin: 3.8 g/dL (ref 3.5–5.0)
Alkaline Phosphatase: 62 U/L (ref 26–84)
Anion gap: 12 (ref 5–15)
BUN: 13 mg/dL (ref 7–22)
CALCIUM: 9.3 mg/dL (ref 8.0–10.3)
CO2: 30 mmol/L (ref 18–33)
Chloride: 101 mmol/L (ref 98–108)
Creatinine: 0.7 mg/dL (ref 0.60–1.20)
GLUCOSE: 157 mg/dL — AB (ref 73–118)
Potassium: 3.6 mmol/L (ref 3.3–4.7)
SODIUM: 143 mmol/L (ref 128–145)
TOTAL PROTEIN: 8 g/dL (ref 6.4–8.1)
Total Bilirubin: 0.8 mg/dL (ref 0.2–1.6)

## 2018-01-08 LAB — CBC WITH DIFFERENTIAL (CANCER CENTER ONLY)
BASOS ABS: 0.1 10*3/uL (ref 0.0–0.1)
Basophils Relative: 1 %
EOS ABS: 0.3 10*3/uL (ref 0.0–0.5)
EOS PCT: 2 %
HCT: 40 % (ref 34.8–46.6)
Hemoglobin: 12.9 g/dL (ref 11.6–15.9)
LYMPHS PCT: 36 %
Lymphs Abs: 4.3 10*3/uL — ABNORMAL HIGH (ref 0.9–3.3)
MCH: 28.2 pg (ref 26.0–34.0)
MCHC: 32.3 g/dL (ref 32.0–36.0)
MCV: 87.5 fL (ref 81.0–101.0)
Monocytes Absolute: 0.7 10*3/uL (ref 0.1–0.9)
Monocytes Relative: 6 %
Neutro Abs: 6.8 10*3/uL — ABNORMAL HIGH (ref 1.5–6.5)
Neutrophils Relative %: 55 %
PLATELETS: 446 10*3/uL — AB (ref 145–400)
RBC: 4.57 MIL/uL (ref 3.70–5.32)
RDW: 13.1 % (ref 11.1–15.7)
WBC: 12.2 10*3/uL — AB (ref 3.9–10.0)

## 2018-01-08 MED ORDER — PROCHLORPERAZINE MALEATE 10 MG PO TABS
ORAL_TABLET | ORAL | Status: AC
  Filled 2018-01-08: qty 1

## 2018-01-08 MED ORDER — BORTEZOMIB CHEMO SQ INJECTION 3.5 MG (2.5MG/ML)
1.3000 mg/m2 | Freq: Once | INTRAMUSCULAR | Status: AC
  Administered 2018-01-08: 2.75 mg via SUBCUTANEOUS
  Filled 2018-01-08: qty 2.75

## 2018-01-08 MED ORDER — PROCHLORPERAZINE MALEATE 10 MG PO TABS
10.0000 mg | ORAL_TABLET | Freq: Once | ORAL | Status: AC
  Administered 2018-01-08: 10 mg via ORAL

## 2018-01-08 NOTE — Patient Instructions (Signed)

## 2018-01-08 NOTE — Progress Notes (Signed)
Hematology and Oncology Follow Up Visit  [name] [id number] [birth year] 67 y.o. 01/08/2018   Principle Diagnosis:  1. IgG Kappa smoldering myeloma 2. Chronic neuropathy  Current Therapy:   Velcade q week dosing (3/1) -- s/p cycle #1  Interim History:  [name] is here today for a follow-up.  She had her first cycle of Velcade.  She actually did pretty well with this.  She still has not noted a change yet with the neuropathy.  She has had a little bit of nausea.  This is well controlled with oral medications.  She is had no fever.  She is had no diarrhea.  She does have some bowel changes which have been chronic.  There is been no bleeding.  She is had no cough or shortness of breath.  Her baseline myeloma levels show any M spike of 1 g/dL.  Her IgG level is 1500 mg/dL.  Her Kappa light chain is 3.7 mg/dL.  Overall, her performance status is ECOG 2.  Medications:  Allergies as of 01/08/2018   No Known Allergies     Medication List        Accurate as of 01/08/18 12:19 PM. Always use your most recent med list.          cyclobenzaprine 5 MG tablet Commonly known as:  FLEXERIL Take 1 tablet (5 mg total) by mouth at bedtime as needed for muscle spasms.   famciclovir 500 MG tablet Commonly known as:  FAMVIR Take 1 tablet (500 mg total) by mouth daily.   gabapentin 300 MG capsule Commonly known as:  NEURONTIN Take 1 capsule (300 mg total) by mouth 3 (three) times daily.   ibuprofen 800 MG tablet Commonly known as:  ADVIL,MOTRIN   metFORMIN 500 MG 24 hr tablet Commonly known as:  GLUCOPHAGE-XR Take 1,000 mg by mouth Twice daily. Total of 2,000 mg   metoprolol succinate 100 MG 24 hr tablet Commonly known as:  TOPROL-XL Take 100 mg by mouth daily.   morphine 60 MG 12 hr tablet Commonly known as:  MS CONTIN Take 60 mg by mouth every 12 (twelve) hours.   ondansetron 8 MG tablet Commonly known as:  ZOFRAN Take 1 tablet (8 mg total) by mouth 2 (two)  times daily as needed (Nausea or vomiting).   pravastatin 40 MG tablet Commonly known as:  PRAVACHOL Take 40 mg by mouth daily.   prochlorperazine 10 MG tablet Commonly known as:  COMPAZINE Take 1 tablet (10 mg total) by mouth every 6 (six) hours as needed (Nausea or vomiting).   valsartan-hydrochlorothiazide 160-12.5 MG tablet Commonly known as:  DIOVAN-HCT Take 1 tablet by mouth daily.   venlafaxine XR 150 MG 24 hr capsule Commonly known as:  EFFEXOR-XR Take 150 mg by mouth daily with breakfast.       Allergies: No Known Allergies  Past Medical History, Surgical history, Social history, and Family History were reviewed and updated.  Review of Systems: Review of Systems  Constitutional: Positive for malaise/fatigue.  HENT: Negative.   Eyes: Negative.   Respiratory: Positive for shortness of breath.   Cardiovascular: Positive for palpitations and leg swelling.  Gastrointestinal: Positive for constipation and heartburn.  Genitourinary: Negative.   Musculoskeletal: Positive for joint pain and myalgias.  Skin: Negative.   Neurological: Positive for tingling.  Endo/Heme/Allergies: Negative.   Psychiatric/Behavioral: Negative.      Physical Exam:  weight is 208 lb (94.3 kg). Her oral temperature is 98.2 F (36.8 C). Her blood pressure is  139/74 and her pulse is 94. Her respiration is 18 and oxygen saturation is 96%.   Wt Readings from Last 3 Encounters:  01/08/18 208 lb (94.3 kg)  12/25/17 211 lb 4 oz (95.8 kg)  12/18/17 211 lb 8 oz (95.9 kg)    Physical Exam  Constitutional: She is oriented to person, place, and time.  HENT:  Head: Normocephalic and atraumatic.  Mouth/Throat: Oropharynx is clear and moist.  Eyes: Pupils are equal, round, and reactive to light. EOM are normal.  Neck: Normal range of motion.  Cardiovascular: Normal rate, regular rhythm and normal heart sounds.  Pulmonary/Chest: Effort normal and breath sounds normal.  Abdominal: Soft. Bowel  sounds are normal.  Musculoskeletal: Normal range of motion. She exhibits no edema, tenderness or deformity.  Lymphadenopathy:    She has no cervical adenopathy.  Neurological: She is alert and oriented to person, place, and time.  Skin: Skin is warm and dry. No rash noted. No erythema.  Psychiatric: She has a normal mood and affect. Her behavior is normal. Judgment and thought content normal.  Vitals reviewed.   Lab Results  Component Value Date   WBC 12.2 (H) 01/08/2018   HGB 12.9 01/08/2018   HCT 40.0 01/08/2018   MCV 87.5 01/08/2018   PLT 446 (H) 01/08/2018   Lab Results  Component Value Date   FERRITIN 79 06/06/2012   IRON 79 09/02/2011   TIBC 421 09/02/2011   UIBC 342 09/02/2011   IRONPCTSAT 19 (L) 09/02/2011   Lab Results  Component Value Date   RBC 4.57 01/08/2018   Lab Results  Component Value Date   KPAFRELGTCHN 36.9 (H) 12/04/2017   LAMBDASER 24.9 12/04/2017   KAPLAMBRATIO 1.48 12/04/2017   Lab Results  Component Value Date   IGGSERUM 1,496 12/04/2017   IGA 80 (L) 12/04/2017   IGMSERUM 46 12/04/2017   Lab Results  Component Value Date   TOTALPROTELP 7.6 12/04/2017   ALBUMINELP 3.6 12/04/2017   A1GS 0.2 12/04/2017   A2GS 1.0 12/04/2017   BETS 1.3 12/04/2017   BETA2SER 0.3 02/20/2015   GAMS 1.5 12/04/2017   MSPIKE 1.0 (H) 12/04/2017   SPEI * 02/20/2015     Chemistry      Component Value Date/Time   NA 143 01/08/2018 1102   NA 140 03/27/2017 0804   K 3.6 01/08/2018 1102   K 3.7 03/27/2017 0804   CL 101 01/08/2018 1102   CL 94 (L) 02/20/2015 1010   CO2 30 01/08/2018 1102   CO2 28 03/27/2017 0804   BUN 13 01/08/2018 1102   BUN 14.9 03/27/2017 0804   CREATININE 0.70 01/08/2018 1102   CREATININE 0.9 03/27/2017 0804      Component Value Date/Time   CALCIUM 9.3 01/08/2018 1102   CALCIUM 10.0 03/27/2017 0804   ALKPHOS 62 01/08/2018 1102   ALKPHOS 69 03/27/2017 0804   AST 36 01/08/2018 1102   AST 26 03/27/2017 0804   ALT 30 01/08/2018 1102     ALT 28 03/27/2017 0804   BILITOT 0.8 01/08/2018 1102   BILITOT 0.76 03/27/2017 0804     Impression and Plan: [name] is 67 yo white female with smoldering myeloma and also chronic neuropathy due to diabetes and chronic back issues.   We will go ahead and proceed with her second cycle of Velcade.  I think that she is doing okay with it.  I am not sure if we will see a response with an improvement in her neuropathy.  I will plan  to get her back in 4 more weeks.   [name], MD 4/29/201912:19 PM

## 2018-01-09 LAB — KAPPA/LAMBDA LIGHT CHAINS
Kappa free light chain: 41.9 mg/L — ABNORMAL HIGH (ref 3.3–19.4)
Kappa, lambda light chain ratio: 1.63 (ref 0.26–1.65)
Lambda free light chains: 25.7 mg/L (ref 5.7–26.3)

## 2018-01-09 LAB — IGG, IGA, IGM
IGA: 75 mg/dL — AB (ref 87–352)
IGG (IMMUNOGLOBIN G), SERUM: 1515 mg/dL (ref 700–1600)
IgM (Immunoglobulin M), Srm: 45 mg/dL (ref 26–217)

## 2018-01-12 LAB — PROTEIN ELECTROPHORESIS, SERUM, WITH REFLEX
A/G Ratio: 1 (ref 0.7–1.7)
ALPHA-1-GLOBULIN: 0.2 g/dL (ref 0.0–0.4)
Albumin ELP: 3.7 g/dL (ref 2.9–4.4)
Alpha-2-Globulin: 1 g/dL (ref 0.4–1.0)
Beta Globulin: 1.2 g/dL (ref 0.7–1.3)
Gamma Globulin: 1.4 g/dL (ref 0.4–1.8)
Globulin, Total: 3.8 g/dL (ref 2.2–3.9)
M-Spike, %: 0.9 g/dL — ABNORMAL HIGH
SPEP Interpretation: 0
TOTAL PROTEIN ELP: 7.5 g/dL (ref 6.0–8.5)

## 2018-01-12 LAB — IMMUNOFIXATION REFLEX, SERUM
IgA: 69 mg/dL — ABNORMAL LOW (ref 87–352)
IgG (Immunoglobin G), Serum: 1437 mg/dL (ref 700–1600)
IgM (Immunoglobulin M), Srm: 44 mg/dL (ref 26–217)

## 2018-01-15 ENCOUNTER — Telehealth: Payer: Self-pay | Admitting: *Deleted

## 2018-01-15 ENCOUNTER — Inpatient Hospital Stay: Payer: BLUE CROSS/BLUE SHIELD | Attending: Hematology & Oncology

## 2018-01-15 VITALS — BP 121/74 | HR 80 | Temp 99.0°F | Resp 17

## 2018-01-15 DIAGNOSIS — R002 Palpitations: Secondary | ICD-10-CM | POA: Diagnosis not present

## 2018-01-15 DIAGNOSIS — Z79899 Other long term (current) drug therapy: Secondary | ICD-10-CM | POA: Insufficient documentation

## 2018-01-15 DIAGNOSIS — Z7984 Long term (current) use of oral hypoglycemic drugs: Secondary | ICD-10-CM | POA: Diagnosis not present

## 2018-01-15 DIAGNOSIS — C9000 Multiple myeloma not having achieved remission: Secondary | ICD-10-CM | POA: Diagnosis not present

## 2018-01-15 DIAGNOSIS — D472 Monoclonal gammopathy: Secondary | ICD-10-CM

## 2018-01-15 DIAGNOSIS — E1140 Type 2 diabetes mellitus with diabetic neuropathy, unspecified: Secondary | ICD-10-CM | POA: Diagnosis not present

## 2018-01-15 DIAGNOSIS — Z5112 Encounter for antineoplastic immunotherapy: Secondary | ICD-10-CM | POA: Insufficient documentation

## 2018-01-15 DIAGNOSIS — R0602 Shortness of breath: Secondary | ICD-10-CM | POA: Diagnosis not present

## 2018-01-15 MED ORDER — PROCHLORPERAZINE MALEATE 10 MG PO TABS
10.0000 mg | ORAL_TABLET | Freq: Once | ORAL | Status: AC
Start: 2018-01-15 — End: 2018-01-15
  Administered 2018-01-15: 10 mg via ORAL

## 2018-01-15 MED ORDER — BORTEZOMIB CHEMO SQ INJECTION 3.5 MG (2.5MG/ML)
1.3000 mg/m2 | Freq: Once | INTRAMUSCULAR | Status: AC
  Administered 2018-01-15: 2.75 mg via SUBCUTANEOUS
  Filled 2018-01-15: qty 1.1

## 2018-01-15 MED ORDER — PROCHLORPERAZINE MALEATE 10 MG PO TABS
ORAL_TABLET | ORAL | Status: AC
  Filled 2018-01-15: qty 1

## 2018-01-15 NOTE — Patient Instructions (Signed)

## 2018-01-15 NOTE — Telephone Encounter (Signed)
Message left on patient's private cell phone to inform her per Dr. Marin Olp that protein level is down to 0.9 and that this is ok after one cycle of therapy.  Instructed pt to call office back with any questions or concerns.

## 2018-01-15 NOTE — Telephone Encounter (Signed)
-----   Message from [name], MD sent at 01/12/2018  4:55 PM EDT ----- Call - the protein level went down to 0.9!!  This is ok after 1 cycle of therapy!!  Laurey Arrow

## 2018-01-16 ENCOUNTER — Telehealth: Payer: Self-pay

## 2018-01-16 NOTE — Telephone Encounter (Signed)
SENT REFERRAL TO SCHEDULING 

## 2018-01-22 ENCOUNTER — Inpatient Hospital Stay: Payer: BLUE CROSS/BLUE SHIELD

## 2018-01-22 VITALS — BP 126/59 | HR 82 | Temp 98.5°F | Resp 16

## 2018-01-22 DIAGNOSIS — Z79899 Other long term (current) drug therapy: Secondary | ICD-10-CM | POA: Diagnosis not present

## 2018-01-22 DIAGNOSIS — E1140 Type 2 diabetes mellitus with diabetic neuropathy, unspecified: Secondary | ICD-10-CM | POA: Diagnosis not present

## 2018-01-22 DIAGNOSIS — C9000 Multiple myeloma not having achieved remission: Secondary | ICD-10-CM | POA: Diagnosis not present

## 2018-01-22 DIAGNOSIS — Z7984 Long term (current) use of oral hypoglycemic drugs: Secondary | ICD-10-CM | POA: Diagnosis not present

## 2018-01-22 DIAGNOSIS — Z5112 Encounter for antineoplastic immunotherapy: Secondary | ICD-10-CM | POA: Diagnosis not present

## 2018-01-22 DIAGNOSIS — D472 Monoclonal gammopathy: Secondary | ICD-10-CM

## 2018-01-22 MED ORDER — PROCHLORPERAZINE MALEATE 10 MG PO TABS
10.0000 mg | ORAL_TABLET | Freq: Once | ORAL | Status: AC
  Administered 2018-01-22: 10 mg via ORAL

## 2018-01-22 MED ORDER — PROCHLORPERAZINE MALEATE 10 MG PO TABS
ORAL_TABLET | ORAL | Status: AC
  Filled 2018-01-22: qty 1

## 2018-01-22 MED ORDER — BORTEZOMIB CHEMO SQ INJECTION 3.5 MG (2.5MG/ML)
1.3000 mg/m2 | Freq: Once | INTRAMUSCULAR | Status: AC
  Administered 2018-01-22: 2.75 mg via SUBCUTANEOUS
  Filled 2018-01-22: qty 1.1

## 2018-01-22 NOTE — Progress Notes (Signed)
Per Dr Marin Olp,  Pt getting monthly labs. OK to treat based on 01/08/18 labwork.  Pt questions if she is to continue Neurontin 300mg  qhs. Per pt, medication is "knocking the edge off" her bilateral foot neuropathic pain, but doesn't relieve it. Per Dr Marin Olp, pt to increase dose to 600mg  neurontin qhs. Pt and daughter verbalize understanding and repeat back instructions. Medication list update to reflect this change.  dph

## 2018-01-22 NOTE — Patient Instructions (Signed)

## 2018-01-23 NOTE — Progress Notes (Signed)
Cardiology Office Note:    Date:  01/24/2018   ID:  [name], DOB Jan 24, 1951, MRN [id number]  PCP:  Carol Ada, MD  Cardiologist:  Sinclair Grooms, MD   Referring MD: Carol Ada, MD   Chief Complaint  Patient presents with  . New Patient (Initial Visit)    palpitations    History of Present Illness:    [name] is a 67 y.o. female with a hx of HTN, DM, HLD, obesity, and smoldering multiple myeloma. He presents as a new patient for palpitations, shortness of breath, and suspected proxysmal SVT. Pt has seen Dr. Radford Pax in the past. Pt reports she had a bout of Afib in 2009, not anticoagulated. The circumstances surrounding this Afib episode are unclear. She has not had issues with Afib since 2009.   She presents today after 2-3 week history of rapid heart rate, palpitations, and shortness of breath. Her first episode was April 25th. This occurred when she was doing her morning chores. She felt palpittions, heart racing, and shortness of breath. She went back to bed. The whole episode lasted approximately 10 min. She has continued having episodes 1-2 times daily. Of note, she recenlty started valcade shots - she has one injection every Monday for 3 weeks, then off one week. She started injections on April 1 and had an injection on April 25th. An adverse effect of the valcade is "aggrevation of Afib/flutter." She states that she now gets short of breath and feels less palpitations. The episodes have decreased in frequency over the past week. The episodes occur with rest or exertion. She denies chest pain, lower extremity edema, and near/syncope.   Medications include toprol 100 mg, valsartan-HCTZ, and pravastatin   Past Medical History:  Diagnosis Date  . Asthma   . Diabetes ([place])   . High cholesterol   . Hypertension   . Neuropathy associated with MGUS ([place])   . Smoldering multiple myeloma ([name]) 12/04/2017    Past Surgical History:  Procedure Laterality  Date  . BACK SURGERY    . NASAL SINUS SURGERY    . NECK SURGERY      Current Medications: Current Meds  Medication Sig  . cyclobenzaprine (FLEXERIL) 5 MG tablet Take 1 tablet (5 mg total) by mouth at bedtime as needed for muscle spasms.  . famciclovir (FAMVIR) 500 MG tablet Take 1 tablet (500 mg total) by mouth daily.  Marland Kitchen gabapentin (NEURONTIN) 300 MG capsule Take 300 mg by mouth at bedtime.  Marland Kitchen ibuprofen (ADVIL,MOTRIN) 800 MG tablet   . metFORMIN (GLUCOPHAGE-XR) 500 MG 24 hr tablet Take 1,000 mg by mouth Twice daily. Total of 2,000 mg  . metoprolol (TOPROL-XL) 100 MG 24 hr tablet Take 100 mg by mouth daily.    Marland Kitchen morphine (MS CONTIN) 60 MG 12 hr tablet Take 60 mg by mouth every 12 (twelve) hours.   . pravastatin (PRAVACHOL) 40 MG tablet Take 40 mg by mouth daily.  . prochlorperazine (COMPAZINE) 10 MG tablet Take 1 tablet (10 mg total) by mouth every 6 (six) hours as needed (Nausea or vomiting).  . valsartan-hydrochlorothiazide (DIOVAN-HCT) 160-12.5 MG per tablet Take 1 tablet by mouth daily.  Marland Kitchen venlafaxine XR (EFFEXOR-XR) 150 MG 24 hr capsule Take 150 mg by mouth daily with breakfast.  . [DISCONTINUED] gabapentin (NEURONTIN) 300 MG capsule Take 1 capsule (300 mg total) by mouth 3 (three) times daily. (Patient taking differently: Take 300 mg by mouth at bedtime. )     Allergies:  Patient has no known allergies.   Social History   Socioeconomic History  . Marital status: Married    Spouse name: Not on file  . Number of children: Not on file  . Years of education: Not on file  . Highest education level: Not on file  Occupational History  . Not on file  Social Needs  . Financial resource strain: Not on file  . Food insecurity:    Worry: Not on file    Inability: Not on file  . Transportation needs:    Medical: Not on file    Non-medical: Not on file  Tobacco Use  . Smoking status: Never Smoker  . Smokeless tobacco: Never Used  . Tobacco comment: never used tobacco  Substance  and Sexual Activity  . Alcohol use: No    Alcohol/week: 0.0 oz  . Drug use: No  . Sexual activity: Not on file  Lifestyle  . Physical activity:    Days per week: Not on file    Minutes per session: Not on file  . Stress: Not on file  Relationships  . Social connections:    Talks on phone: Not on file    Gets together: Not on file    Attends religious service: Not on file    Active member of club or organization: Not on file    Attends meetings of clubs or organizations: Not on file    Relationship status: Not on file  Other Topics Concern  . Not on file  Social History Narrative   Lives with husband and daughter in a one story home.  Has 2 children.     On disability since 2009 for depression, low back pain.      Education: some college.     Family History: The patient's family history includes Healthy in her daughter and son; Heart disease in her mother; Hypertension in her sister; Leukemia in her father; Stroke in her maternal grandfather and maternal grandmother.  ROS:   Please see the history of present illness.    All other systems reviewed and are negative.  EKGs/Labs/Other Studies Reviewed:    The following studies were reviewed today:  none  EKG:  EKG is ordered today.  The ekg ordered today demonstrates sinus  Recent Labs: 01/08/2018: ALT 30; BUN 13; Creatinine 0.70; Hemoglobin 12.9; Platelet Count 446; Potassium 3.6; Sodium 143  Recent Lipid Panel No results found for: CHOL, TRIG, HDL, CHOLHDL, VLDL, LDLCALC, LDLDIRECT  Physical Exam:    VS:  BP 118/78   Pulse 83   Ht 5' 6"  (1.676 m)   Wt 212 lb (96.2 kg)   SpO2 97%   BMI 34.22 kg/m     Wt Readings from Last 3 Encounters:  01/24/18 212 lb (96.2 kg)  01/08/18 208 lb (94.3 kg)  12/25/17 211 lb 4 oz (95.8 kg)     GEN: Well nourished, well developed in no acute distress HEENT: Normal NECK: No JVD; No carotid bruits LYMPHATICS: No lymphadenopathy CARDIAC: RRR, soft systolic murmur, rubs,  gallops RESPIRATORY:  Clear to auscultation without rales, wheezing or rhonchi  ABDOMEN: Soft, non-tender, non-distended MUSCULOSKELETAL:  No edema; No deformity  SKIN: Warm and dry NEUROLOGIC:  Alert and oriented x 3 PSYCHIATRIC:  Normal affect   ASSESSMENT:    1. Palpitations   2. Shortness of breath   3. Multiple myeloma, remission status unspecified (HCC)   4. Essential hypertension   5. Hyperlipidemia, unspecified hyperlipidemia type   6. Type 2 diabetes mellitus  without complication, without long-term current use of insulin (HCC)    PLAN:    In order of problems listed above:  Palpitations, Shortness of breath Will obtain CBC, BMP, BNP, TSH, Mg Echo for structure and function. CXR for shortness of breath. 30-day event monitor. Valcade can aggrevate Afib/flutter. Pt reports she had an episode of Afib in 2008 or 2009. The events or situation surrounding this arrhythmia are unclear. She started valcade April 1 and started having palpitations in April.   Multiple myeloma, remission status unspecified ([place]) Valcade as above. Per primary.  Essential hypertension Pressures well controlled on torpol and valsartan-HCTZ. No medication changes. Managed by PCP.  Hyperlipidemia, unspecified hyperlipidemia type On pravastatin. Managed by PCP.   Type 2 diabetes mellitus without complication, without long-term current use of insulin (HCC) Neurolpathy associated with DM. Per primary.   Case discussed with Dr. Tamala Julian - DOD.  Follow up in 6 weeks with Dr. Tamala Julian after holter.    Medication Adjustments/Labs and Tests Ordered: Current medicines are reviewed at length with the patient today.  Concerns regarding medicines are outlined above.  Orders Placed This Encounter  Procedures  . DG Chest 2 View  . CBC  . Basic metabolic panel  . Pro b natriuretic peptide (BNP)  . Magnesium  . TSH  . Cardiac event monitor  . EKG 12-Lead  . ECHOCARDIOGRAM COMPLETE   No orders of the defined  types were placed in this encounter.   Signed, Ledora Bottcher, PA  01/24/2018 12:01 PM    [facility]

## 2018-01-24 ENCOUNTER — Ambulatory Visit
Admission: RE | Admit: 2018-01-24 | Discharge: 2018-01-24 | Disposition: A | Discharge status: Home / Self Care | Payer: BLUE CROSS/BLUE SHIELD | Source: Ambulatory Visit | Attending: Physician Assistant | Admitting: Physician Assistant

## 2018-01-24 ENCOUNTER — Ambulatory Visit (INDEPENDENT_AMBULATORY_CARE_PROVIDER_SITE_OTHER): Payer: BLUE CROSS/BLUE SHIELD | Admitting: Physician Assistant

## 2018-01-24 ENCOUNTER — Encounter: Payer: Self-pay | Admitting: Physician Assistant

## 2018-01-24 VITALS — BP 118/78 | HR 83 | Ht 66.0 in | Wt 212.0 lb

## 2018-01-24 DIAGNOSIS — R0602 Shortness of breath: Secondary | ICD-10-CM | POA: Diagnosis not present

## 2018-01-24 DIAGNOSIS — R002 Palpitations: Secondary | ICD-10-CM | POA: Diagnosis not present

## 2018-01-24 DIAGNOSIS — E785 Hyperlipidemia, unspecified: Secondary | ICD-10-CM

## 2018-01-24 DIAGNOSIS — E119 Type 2 diabetes mellitus without complications: Secondary | ICD-10-CM

## 2018-01-24 DIAGNOSIS — I10 Essential (primary) hypertension: Secondary | ICD-10-CM | POA: Diagnosis not present

## 2018-01-24 DIAGNOSIS — C9000 Multiple myeloma not having achieved remission: Secondary | ICD-10-CM | POA: Diagnosis not present

## 2018-01-24 NOTE — Patient Instructions (Addendum)
Medication Instructions:  Your physician recommends that you continue on your current medications as directed. Please refer to the Current Medication list given to you today.   Labwork: TODAY:  BMET, CBC, PRO BNP, MAG, & TSH   Testing/Procedures: A chest x-ray takes a picture of the organs and structures inside the chest, including the heart, lungs, and blood vessels. This test can show several things, including, whether the heart is enlarges; whether fluid is building up in the lungs; and whether pacemaker / defibrillator leads are still in place.  Your physician has requested that you have an echocardiogram. Echocardiography is a painless test that uses sound waves to create images of your heart. It provides your doctor with information about the size and shape of your heart and how well your heart's chambers and valves are working. This procedure takes approximately one hour. There are no restrictions for this procedure.  Your physician has recommended that you wear an event monitor. Event monitors are medical devices that record the heart's electrical activity. Doctors most often [name] these monitors to diagnose arrhythmias. Arrhythmias are problems with the speed or rhythm of the heartbeat. The monitor is a small, portable device. You can wear one while you do your normal daily activities. This is usually used to diagnose what is causing palpitations/syncope (passing out).    Follow-Up: Your physician recommends that you schedule a follow-up appointment in: [place]    Any Other Special Instructions Will Be Listed Below (If Applicable).   Cardiac Event Monitoring A cardiac event monitor is a small recording device that is used to detect abnormal heart rhythms (arrhythmias). The monitor is used to record your heart rhythm when you have symptoms, such as:  Fast heartbeats (palpitations), such as heart racing or fluttering.  Dizziness.  Fainting or  light-headedness.  Unexplained weakness.  Some monitors are wired to electrodes placed on your chest. Electrodes are flat, sticky disks that attach to your skin. Other monitors may be hand-held or worn on the wrist. The monitor can be worn for up to 30 days. If the monitor is attached to your chest, a technician will prepare your chest for the electrode placement and show you how to work the monitor. Take time to practice using the monitor before you leave the office. Make sure you understand how to send the information from the monitor to your health care provider. In some cases, you may need to use a landline telephone instead of a cell phone. What are the risks? Generally, this device is safe to use, but it possible that the skin under the electrodes will become irritated. How to use your cardiac event monitor  Wear your monitor at all times, except when you are in water: ? Do not let the monitor get wet. ? Take the monitor off when you bathe. Do not swim or use a hot tub with it on.  Keep your skin clean. Do not put body lotion or moisturizer on your chest.  Change the electrodes as told by your health care provider or any time they stop sticking to your skin. You may need to use medical tape to keep them on.  Try to put the electrodes in slightly different places on your chest to help prevent skin irritation. They must remain in the area under your left breast and in the upper right section of your chest.  Make sure the monitor is safely clipped to your clothing or in a location close to your body  that your health care provider recommends.  Press the button to record as soon as you feel heart-related symptoms, such as: ? Dizziness. ? Weakness. ? Light-headedness. ? Palpitations. ? Thumping or pounding in your chest. ? Shortness of breath. ? Unexplained weakness.  Keep a diary of your activities, such as walking, doing chores, and taking medicine. It is very important to note what  you were doing when you pushed the button to record your symptoms. This will help your health care provider determine what might be contributing to your symptoms.  Send the recorded information as recommended by your health care provider. It may take some time for your health care provider to process the results.  Change the batteries as told by your health care provider.  Keep electronic devices away from your monitor. This includes: ? Tablets. ? MP3 players. ? Cell phones.  While wearing your monitor you should avoid: ? Electric blankets. ? Armed forces operational officer. ? Electric toothbrushes. ? Microwave ovens. ? Magnets. ? Metal detectors. Get help right away if:  You have chest pain.  You have extreme difficulty breathing or shortness of breath.  You develop a very fast heartbeat that persists.  You develop dizziness that does not go away.  You faint or constantly feel like you are about to faint. Summary  A cardiac event monitor is a small recording device that is used to help detect abnormal heart rhythms (arrhythmias).  The monitor is used to record your heart rhythm when you have heart-related symptoms.  Make sure you understand how to send the information from the monitor to your health care provider.  It is important to press the button on the monitor when you have any heart-related symptoms.  Keep a diary of your activities, such as walking, doing chores, and taking medicine. It is very important to note what you were doing when you pushed the button to record your symptoms. This will help your health care provider learn what might be causing your symptoms. This information is not intended to replace advice given to you by your health care provider. Make sure you discuss any questions you have with your health care provider. Document Released: 06/07/2008 Document Revised: 08/13/2016 Document Reviewed: 08/13/2016 Elsevier Interactive Patient Education  2017 [employer] are tests that create pictures of the inside of your body using radiation. Different body parts absorb different amounts of radiation, which show up on the X-ray pictures in shades of black, gray, and white. X-rays are used to look for many health conditions, including broken bones, lung problems, and some types of cancer. Tell a health care provider about:  Any allergies you have.  All medicines you are taking, including vitamins, herbs, eye drops, creams, and over-the-counter medicines.  Previous surgeries you have had.  Medical conditions you have. What are the risks? Getting an X-ray is a safe procedure. What happens before the procedure?  Tell the X-ray technician if you are pregnant or might be pregnant.  You may be asked to wear a protective lead apron to hide parts of your body from the X-ray.  You usually will need to undress whatever part of your body needs the X-ray. You will be given a hospital gown to wear, if needed.  You may need to remove your glasses, jewelry, and other metal objects. What happens during the procedure?  The X-ray machine creates a picture by using a tiny burst of radiation. It is painless.  You may need to have several  pictures taken at different angles.  You will need to try to be as still as you can during the examination to get the best possible images. What happens after the procedure?  You will be able to resume your normal activities.  The X-ray will be examined by your health care provider or a radiology specialist.  It is your responsibility to get your test results. Ask your health care provider when to expect your results and how to get them. This information is not intended to replace advice given to you by your health care provider. Make sure you discuss any questions you have with your health care provider. Document Released: 08/29/2005 Document Revised: 04/28/2016 Document Reviewed: 10/23/2013 Elsevier  Interactive Patient Education  2018 [employer].  Echocardiogram An echocardiogram, or echocardiography, uses sound waves (ultrasound) to produce an image of your heart. The echocardiogram is simple, painless, obtained within a short period of time, and offers valuable information to your health care provider. The images from an echocardiogram can provide information such as:  Evidence of coronary artery disease (CAD).  Heart size.  Heart muscle function.  Heart valve function.  Aneurysm detection.  Evidence of a past heart attack.  Fluid buildup around the heart.  Heart muscle thickening.  Assess heart valve function.  Tell a health care provider about:  Any allergies you have.  All medicines you are taking, including vitamins, herbs, eye drops, creams, and over-the-counter medicines.  Any problems you or family members have had with anesthetic medicines.  Any blood disorders you have.  Any surgeries you have had.  Any medical conditions you have.  Whether you are pregnant or may be pregnant. What happens before the procedure? No special preparation is needed. Eat and drink normally. What happens during the procedure?  In order to produce an image of your heart, gel will be applied to your chest and a wand-like tool (transducer) will be moved over your chest. The gel will help transmit the sound waves from the transducer. The sound waves will harmlessly bounce off your heart to allow the heart images to be captured in real-time motion. These images will then be recorded.  You may need an IV to receive a medicine that improves the quality of the pictures. What happens after the procedure? You may return to your normal schedule including diet, activities, and medicines, unless your health care provider tells you otherwise. This information is not intended to replace advice given to you by your health care provider. Make sure you discuss any questions you have with your  health care provider. Document Released: 08/26/2000 Document Revised: 04/16/2016 Document Reviewed: 05/06/2013 Elsevier Interactive Patient Education  2017 [employer].   If you need a refill on your cardiac medications before your next appointment, please call your pharmacy.

## 2018-01-24 NOTE — Addendum Note (Signed)
Addended by: Minette Brine on: 01/24/2018 12:49 PM   Modules accepted: Level of Service

## 2018-01-25 LAB — BASIC METABOLIC PANEL
BUN/Creatinine Ratio: 19 (ref 12–28)
BUN: 15 mg/dL (ref 8–27)
CALCIUM: 9.4 mg/dL (ref 8.7–10.3)
CO2: 24 mmol/L (ref 20–29)
CREATININE: 0.81 mg/dL (ref 0.57–1.00)
Chloride: 99 mmol/L (ref 96–106)
GFR calc Af Amer: 88 mL/min/{1.73_m2} (ref >59)
GFR, EST NON AFRICAN AMERICAN: 76 mL/min/{1.73_m2} (ref >59)
GLUCOSE: 172 mg/dL — AB (ref 65–99)
Potassium: 4 mmol/L (ref 3.5–5.2)
SODIUM: 141 mmol/L (ref 134–144)

## 2018-01-25 LAB — CBC
HEMOGLOBIN: 12 g/dL (ref 11.1–15.9)
Hematocrit: 38.5 % (ref 34.0–46.6)
MCH: 26.9 pg (ref 26.6–33.0)
MCHC: 31.2 g/dL — ABNORMAL LOW (ref 31.5–35.7)
MCV: 86 fL (ref 79–97)
Platelets: 307 10*3/uL (ref 150–379)
RBC: 4.46 x10E6/uL (ref 3.77–5.28)
RDW: 13.7 % (ref 12.3–15.4)
WBC: 8.1 10*3/uL (ref 3.4–10.8)

## 2018-01-25 LAB — TSH: TSH: 1.96 u[IU]/mL (ref 0.450–4.500)

## 2018-01-25 LAB — PRO B NATRIURETIC PEPTIDE: NT-PRO BNP: 49 pg/mL (ref 0–301)

## 2018-01-25 LAB — MAGNESIUM: MAGNESIUM: 2.1 mg/dL (ref 1.6–2.3)

## 2018-01-26 ENCOUNTER — Telehealth: Payer: Self-pay | Admitting: Physician Assistant

## 2018-01-26 NOTE — Telephone Encounter (Signed)
New message    Patient calling to get lab results. Please call

## 2018-01-26 NOTE — Telephone Encounter (Signed)
Returned patient call re: her labs results from 01/23/18.  Patient advised that her glucose is elevated and she needs to continue seeing her PCP to follow her A1C. Patient says she is seeing her in 2 weeks.

## 2018-02-02 ENCOUNTER — Ambulatory Visit (HOSPITAL_COMMUNITY): Payer: BLUE CROSS/BLUE SHIELD | Attending: Cardiology

## 2018-02-02 ENCOUNTER — Ambulatory Visit (INDEPENDENT_AMBULATORY_CARE_PROVIDER_SITE_OTHER): Payer: BLUE CROSS/BLUE SHIELD

## 2018-02-02 ENCOUNTER — Other Ambulatory Visit: Payer: Self-pay

## 2018-02-02 DIAGNOSIS — R0602 Shortness of breath: Secondary | ICD-10-CM | POA: Diagnosis not present

## 2018-02-02 DIAGNOSIS — R0600 Dyspnea, unspecified: Secondary | ICD-10-CM | POA: Diagnosis not present

## 2018-02-02 DIAGNOSIS — I119 Hypertensive heart disease without heart failure: Secondary | ICD-10-CM | POA: Diagnosis not present

## 2018-02-02 DIAGNOSIS — E785 Hyperlipidemia, unspecified: Secondary | ICD-10-CM | POA: Insufficient documentation

## 2018-02-02 DIAGNOSIS — E119 Type 2 diabetes mellitus without complications: Secondary | ICD-10-CM | POA: Diagnosis not present

## 2018-02-02 DIAGNOSIS — R002 Palpitations: Secondary | ICD-10-CM

## 2018-02-02 DIAGNOSIS — I080 Rheumatic disorders of both mitral and aortic valves: Secondary | ICD-10-CM | POA: Diagnosis not present

## 2018-02-02 DIAGNOSIS — R609 Edema, unspecified: Secondary | ICD-10-CM | POA: Insufficient documentation

## 2018-02-06 ENCOUNTER — Inpatient Hospital Stay (HOSPITAL_BASED_OUTPATIENT_CLINIC_OR_DEPARTMENT_OTHER): Payer: BLUE CROSS/BLUE SHIELD | Admitting: Hematology & Oncology

## 2018-02-06 ENCOUNTER — Inpatient Hospital Stay: Payer: BLUE CROSS/BLUE SHIELD

## 2018-02-06 VITALS — BP 102/82 | HR 87 | Temp 97.7°F | Resp 16 | Wt 211.8 lb

## 2018-02-06 DIAGNOSIS — D472 Monoclonal gammopathy: Secondary | ICD-10-CM

## 2018-02-06 DIAGNOSIS — E1121 Type 2 diabetes mellitus with diabetic nephropathy: Secondary | ICD-10-CM

## 2018-02-06 DIAGNOSIS — C9000 Multiple myeloma not having achieved remission: Secondary | ICD-10-CM

## 2018-02-06 DIAGNOSIS — E1140 Type 2 diabetes mellitus with diabetic neuropathy, unspecified: Secondary | ICD-10-CM | POA: Diagnosis not present

## 2018-02-06 DIAGNOSIS — R61 Generalized hyperhidrosis: Secondary | ICD-10-CM

## 2018-02-06 DIAGNOSIS — Z5112 Encounter for antineoplastic immunotherapy: Secondary | ICD-10-CM | POA: Diagnosis not present

## 2018-02-06 DIAGNOSIS — Z79899 Other long term (current) drug therapy: Secondary | ICD-10-CM

## 2018-02-06 DIAGNOSIS — Z7984 Long term (current) use of oral hypoglycemic drugs: Secondary | ICD-10-CM | POA: Diagnosis not present

## 2018-02-06 LAB — CBC WITH DIFFERENTIAL (CANCER CENTER ONLY)
BASOS ABS: 0.1 10*3/uL (ref 0.0–0.1)
BASOS PCT: 1 %
EOS PCT: 3 %
Eosinophils Absolute: 0.3 10*3/uL (ref 0.0–0.5)
HEMATOCRIT: 37.1 % (ref 34.8–46.6)
Hemoglobin: 11.8 g/dL (ref 11.6–15.9)
LYMPHS PCT: 32 %
Lymphs Abs: 3.3 10*3/uL (ref 0.9–3.3)
MCH: 27.8 pg (ref 26.0–34.0)
MCHC: 31.8 g/dL — ABNORMAL LOW (ref 32.0–36.0)
MCV: 87.5 fL (ref 81.0–101.0)
Monocytes Absolute: 0.7 10*3/uL (ref 0.1–0.9)
Monocytes Relative: 7 %
NEUTROS ABS: 5.9 10*3/uL (ref 1.5–6.5)
Neutrophils Relative %: 57 %
PLATELETS: 380 10*3/uL (ref 145–400)
RBC: 4.24 MIL/uL (ref 3.70–5.32)
RDW: 13.4 % (ref 11.1–15.7)
WBC: 10.2 10*3/uL — AB (ref 3.9–10.0)

## 2018-02-06 LAB — CMP (CANCER CENTER ONLY)
ALBUMIN: 3.8 g/dL (ref 3.5–5.0)
ALT: 29 U/L (ref 10–47)
ANION GAP: 7 (ref 5–15)
AST: 25 U/L (ref 11–38)
Alkaline Phosphatase: 52 U/L (ref 26–84)
BUN: 16 mg/dL (ref 7–22)
CALCIUM: 9.4 mg/dL (ref 8.0–10.3)
CO2: 32 mmol/L (ref 18–33)
CREATININE: 0.9 mg/dL (ref 0.60–1.20)
Chloride: 101 mmol/L (ref 98–108)
GLUCOSE: 181 mg/dL — AB (ref 73–118)
Potassium: 3.4 mmol/L (ref 3.3–4.7)
Sodium: 140 mmol/L (ref 128–145)
TOTAL PROTEIN: 7.6 g/dL (ref 6.4–8.1)
Total Bilirubin: 0.9 mg/dL (ref 0.2–1.6)

## 2018-02-06 NOTE — Progress Notes (Signed)
Hematology and Oncology Follow Up Visit  [name] [id number] [birth year] 67 y.o. 02/06/2018   Principle Diagnosis:  1. IgG Kappa smoldering myeloma 2. Chronic neuropathy  Current Therapy:   Velcade q week dosing (3/1) -- s/p cycle #2 -- d/c on 02/06/2018 for lack of effectiveness  Interim History:  [name] is here today for a follow-up.  Overall, I really have not seen much improvement with [name] since we started the Velcade.  [name] M spike really has not done much.  [name] M spike is now 0.9 g/dL.  This is quite disappointing for me.  She still has some neuropathy.  This is more in the hands and the legs.  She has had no fever.  She still has a lot of sweats.  She really has hyperhidrosis.  She has had no cough.  Is been no bleeding.  She is had no rashes.  Overall, I just do not think that we are getting much improvement with the Velcade.  [name] cardiologist has [name] on a heart monitor right now.  She has to wear this for 30 days.  She was told that [name] heart may be affected by the Velcade.  I told [name] that I have not ever seen that happen with our patients.  Medications:  Allergies as of 02/06/2018   No Known Allergies     Medication List        Accurate as of 02/06/18 12:09 PM. Always use your most recent med list.          cyclobenzaprine 5 MG tablet Commonly known as:  FLEXERIL Take 1 tablet (5 mg total) by mouth at bedtime as needed for muscle spasms.   famciclovir 500 MG tablet Commonly known as:  FAMVIR Take 1 tablet (500 mg total) by mouth daily.   gabapentin 300 MG capsule Commonly known as:  NEURONTIN Take 600 mg by mouth at bedtime.   ibuprofen 800 MG tablet Commonly known as:  ADVIL,MOTRIN   metFORMIN 500 MG 24 hr tablet Commonly known as:  GLUCOPHAGE-XR Take 1,000 mg by mouth Twice daily. Total of 2,000 mg   metoprolol succinate 100 MG 24 hr tablet Commonly known as:  TOPROL-XL Take 100 mg by mouth daily.   morphine 60 MG 12 hr  tablet Commonly known as:  MS CONTIN Take 60 mg by mouth every 12 (twelve) hours.   pravastatin 40 MG tablet Commonly known as:  PRAVACHOL Take 40 mg by mouth daily.   prochlorperazine 10 MG tablet Commonly known as:  COMPAZINE Take 1 tablet (10 mg total) by mouth every 6 (six) hours as needed (Nausea or vomiting).   valsartan-hydrochlorothiazide 160-12.5 MG tablet Commonly known as:  DIOVAN-HCT Take 1 tablet by mouth daily.   venlafaxine XR 150 MG 24 hr capsule Commonly known as:  EFFEXOR-XR Take 150 mg by mouth daily with breakfast.       Allergies: No Known Allergies  Past Medical History, Surgical history, Social history, and Family History were reviewed and updated.  Review of Systems: Review of Systems  Constitutional: Positive for malaise/fatigue.  HENT: Negative.   Eyes: Negative.   Respiratory: Positive for shortness of breath.   Cardiovascular: Positive for palpitations and leg swelling.  Gastrointestinal: Positive for constipation and heartburn.  Genitourinary: Negative.   Musculoskeletal: Positive for joint pain and myalgias.  Skin: Negative.   Neurological: Positive for tingling.  Endo/Heme/Allergies: Negative.   Psychiatric/Behavioral: Negative.      Physical Exam:  weight is 211 lb 12  oz (96 kg). [name] oral temperature is 97.7 F (36.5 C). [name] blood pressure is 102/82 and [name] pulse is 87. [name] respiration is 16 and oxygen saturation is 95%.   Wt Readings from Last 3 Encounters:  02/06/18 211 lb 12 oz (96 kg)  01/24/18 212 lb (96.2 kg)  01/08/18 208 lb (94.3 kg)    Physical Exam  Constitutional: She is oriented to person, place, and time.  HENT:  Head: Normocephalic and atraumatic.  Mouth/Throat: Oropharynx is clear and moist.  Eyes: Pupils are equal, round, and reactive to light. EOM are normal.  Neck: Normal range of motion.  Cardiovascular: Normal rate, regular rhythm and normal heart sounds.  Pulmonary/Chest: Effort normal and breath sounds  normal.  Abdominal: Soft. Bowel sounds are normal.  Musculoskeletal: Normal range of motion. She exhibits no edema, tenderness or deformity.  Lymphadenopathy:    She has no cervical adenopathy.  Neurological: She is alert and oriented to person, place, and time.  Skin: Skin is warm and dry. No rash noted. No erythema.  Psychiatric: She has a normal mood and affect. [name] behavior is normal. Judgment and thought content normal.  Vitals reviewed.   Lab Results  Component Value Date   WBC 10.2 (H) 02/06/2018   HGB 11.8 02/06/2018   HCT 37.1 02/06/2018   MCV 87.5 02/06/2018   PLT 380 02/06/2018   Lab Results  Component Value Date   FERRITIN 79 06/06/2012   IRON 79 09/02/2011   TIBC 421 09/02/2011   UIBC 342 09/02/2011   IRONPCTSAT 19 (L) 09/02/2011   Lab Results  Component Value Date   RBC 4.24 02/06/2018   Lab Results  Component Value Date   KPAFRELGTCHN 41.9 (H) 01/08/2018   LAMBDASER 25.7 01/08/2018   KAPLAMBRATIO 1.63 01/08/2018   Lab Results  Component Value Date   IGGSERUM 1,515 01/08/2018   IGGSERUM 1,437 01/08/2018   IGA 75 (L) 01/08/2018   IGA 69 (L) 01/08/2018   IGMSERUM 45 01/08/2018   IGMSERUM 44 01/08/2018   Lab Results  Component Value Date   TOTALPROTELP 7.5 01/08/2018   ALBUMINELP 3.7 01/08/2018   A1GS 0.2 01/08/2018   A2GS 1.0 01/08/2018   BETS 1.2 01/08/2018   BETA2SER 0.3 02/20/2015   GAMS 1.4 01/08/2018   MSPIKE 0.9 (H) 01/08/2018   SPEI * 02/20/2015     Chemistry      Component Value Date/Time   NA 140 02/06/2018 1124   NA 141 01/24/2018 1213   NA 140 03/27/2017 0804   K 3.4 02/06/2018 1124   K 3.7 03/27/2017 0804   CL 101 02/06/2018 1124   CL 94 (L) 02/20/2015 1010   CO2 32 02/06/2018 1124   CO2 28 03/27/2017 0804   BUN 16 02/06/2018 1124   BUN 15 01/24/2018 1213   BUN 14.9 03/27/2017 0804   CREATININE 0.90 02/06/2018 1124   CREATININE 0.9 03/27/2017 0804      Component Value Date/Time   CALCIUM 9.4 02/06/2018 1124    CALCIUM 10.0 03/27/2017 0804   ALKPHOS 52 02/06/2018 1124   ALKPHOS 69 03/27/2017 0804   AST 25 02/06/2018 1124   AST 26 03/27/2017 0804   ALT 29 02/06/2018 1124   ALT 28 03/27/2017 0804   BILITOT 0.9 02/06/2018 1124   BILITOT 0.76 03/27/2017 0804     Impression and Plan: Ms. Helmkamp is 67 yo white female with smoldering myeloma and also chronic neuropathy due to diabetes and chronic back issues.   At this point, we  will go ahead and stop the Velcade.  I do still think that it is doing much for [name].  I do not believe that any arrhythmias would be from the Velcade.  We will plan to get [name] back in 2 months.  I think this would be reasonable for [name] to get [name] back.  [name], MD 5/28/201912:09 PM

## 2018-02-07 LAB — KAPPA/LAMBDA LIGHT CHAINS
KAPPA, LAMDA LIGHT CHAIN RATIO: 1.61 (ref 0.26–1.65)
Kappa free light chain: 37.9 mg/L — ABNORMAL HIGH (ref 3.3–19.4)
Lambda free light chains: 23.5 mg/L (ref 5.7–26.3)

## 2018-02-07 LAB — IGG, IGA, IGM
IGA: 64 mg/dL — AB (ref 87–352)
IGM (IMMUNOGLOBULIN M), SRM: 39 mg/dL (ref 26–217)
IgG (Immunoglobin G), Serum: 1379 mg/dL (ref 700–1600)

## 2018-02-09 LAB — IMMUNOFIXATION REFLEX, SERUM
IGA: 63 mg/dL — AB (ref 87–352)
IGM (IMMUNOGLOBULIN M), SRM: 43 mg/dL (ref 26–217)
IgG (Immunoglobin G), Serum: 1404 mg/dL (ref 700–1600)

## 2018-02-09 LAB — PROTEIN ELECTROPHORESIS, SERUM, WITH REFLEX
A/G Ratio: 0.9 (ref 0.7–1.7)
ALBUMIN ELP: 3.4 g/dL (ref 2.9–4.4)
ALPHA-1-GLOBULIN: 0.2 g/dL (ref 0.0–0.4)
ALPHA-2-GLOBULIN: 0.9 g/dL (ref 0.4–1.0)
Beta Globulin: 1.2 g/dL (ref 0.7–1.3)
GLOBULIN, TOTAL: 3.6 g/dL (ref 2.2–3.9)
Gamma Globulin: 1.3 g/dL (ref 0.4–1.8)
M-Spike, %: 0.8 g/dL — ABNORMAL HIGH
SPEP INTERP: 0
Total Protein ELP: 7 g/dL (ref 6.0–8.5)

## 2018-02-12 ENCOUNTER — Inpatient Hospital Stay: Payer: BLUE CROSS/BLUE SHIELD

## 2018-02-12 ENCOUNTER — Encounter: Payer: Self-pay | Admitting: General Practice

## 2018-02-12 NOTE — Progress Notes (Signed)
[facility] Spiritual Care Note  Received call from Sharlisa requesting Spiritual Care appt, which we scheduled for 2:30pm on Tuesday 6/11.   [place], [place], [facility] Pager [phone] Voicemail [phone]

## 2018-02-19 ENCOUNTER — Inpatient Hospital Stay: Payer: BLUE CROSS/BLUE SHIELD

## 2018-02-20 ENCOUNTER — Encounter: Payer: Self-pay | Admitting: General Practice

## 2018-02-20 NOTE — Progress Notes (Signed)
[facility] Spiritual Care Note  Received call from [name] requesting to reschedule this pm's appt because she is not feeling physically up to it. She plans to call to reschedule when feeling better.   [place], [place], [facility] Pager [phone] Voicemail [phone]

## 2018-02-21 DIAGNOSIS — M21961 Unspecified acquired deformity of right lower leg: Secondary | ICD-10-CM | POA: Diagnosis not present

## 2018-02-21 DIAGNOSIS — M21962 Unspecified acquired deformity of left lower leg: Secondary | ICD-10-CM | POA: Diagnosis not present

## 2018-02-21 DIAGNOSIS — M79671 Pain in right foot: Secondary | ICD-10-CM | POA: Diagnosis not present

## 2018-02-21 DIAGNOSIS — B351 Tinea unguium: Secondary | ICD-10-CM | POA: Diagnosis not present

## 2018-02-21 DIAGNOSIS — E1151 Type 2 diabetes mellitus with diabetic peripheral angiopathy without gangrene: Secondary | ICD-10-CM | POA: Diagnosis not present

## 2018-02-21 DIAGNOSIS — M79672 Pain in left foot: Secondary | ICD-10-CM | POA: Diagnosis not present

## 2018-02-21 DIAGNOSIS — R238 Other skin changes: Secondary | ICD-10-CM | POA: Diagnosis not present

## 2018-02-27 DIAGNOSIS — H01003 Unspecified blepharitis right eye, unspecified eyelid: Secondary | ICD-10-CM | POA: Diagnosis not present

## 2018-02-27 DIAGNOSIS — L718 Other rosacea: Secondary | ICD-10-CM | POA: Diagnosis not present

## 2018-02-27 DIAGNOSIS — H1013 Acute atopic conjunctivitis, bilateral: Secondary | ICD-10-CM | POA: Diagnosis not present

## 2018-03-02 ENCOUNTER — Ambulatory Visit: Payer: BLUE CROSS/BLUE SHIELD | Admitting: Cardiology

## 2018-03-09 ENCOUNTER — Ambulatory Visit: Payer: Self-pay | Admitting: Podiatry

## 2018-03-09 ENCOUNTER — Encounter

## 2018-03-13 DIAGNOSIS — Z79891 Long term (current) use of opiate analgesic: Secondary | ICD-10-CM | POA: Diagnosis not present

## 2018-03-13 DIAGNOSIS — M961 Postlaminectomy syndrome, not elsewhere classified: Secondary | ICD-10-CM | POA: Diagnosis not present

## 2018-03-13 DIAGNOSIS — G894 Chronic pain syndrome: Secondary | ICD-10-CM | POA: Diagnosis not present

## 2018-03-13 DIAGNOSIS — E1142 Type 2 diabetes mellitus with diabetic polyneuropathy: Secondary | ICD-10-CM | POA: Diagnosis not present

## 2018-03-29 DIAGNOSIS — B353 Tinea pedis: Secondary | ICD-10-CM | POA: Diagnosis not present

## 2018-03-29 DIAGNOSIS — L602 Onychogryphosis: Secondary | ICD-10-CM | POA: Diagnosis not present

## 2018-03-29 DIAGNOSIS — B351 Tinea unguium: Secondary | ICD-10-CM | POA: Diagnosis not present

## 2018-03-29 DIAGNOSIS — E1151 Type 2 diabetes mellitus with diabetic peripheral angiopathy without gangrene: Secondary | ICD-10-CM | POA: Diagnosis not present

## 2018-03-30 DIAGNOSIS — Z79899 Other long term (current) drug therapy: Secondary | ICD-10-CM | POA: Diagnosis not present

## 2018-04-11 ENCOUNTER — Inpatient Hospital Stay: Payer: BLUE CROSS/BLUE SHIELD

## 2018-04-11 ENCOUNTER — Inpatient Hospital Stay: Payer: BLUE CROSS/BLUE SHIELD | Admitting: Hematology & Oncology

## 2018-04-20 DIAGNOSIS — H353131 Nonexudative age-related macular degeneration, bilateral, early dry stage: Secondary | ICD-10-CM | POA: Diagnosis not present

## 2018-04-20 DIAGNOSIS — E119 Type 2 diabetes mellitus without complications: Secondary | ICD-10-CM | POA: Diagnosis not present

## 2018-04-20 DIAGNOSIS — H35363 Drusen (degenerative) of macula, bilateral: Secondary | ICD-10-CM | POA: Diagnosis not present

## 2018-04-20 DIAGNOSIS — I7090 Unspecified atherosclerosis: Secondary | ICD-10-CM | POA: Diagnosis not present

## 2018-05-02 ENCOUNTER — Inpatient Hospital Stay: Payer: BLUE CROSS/BLUE SHIELD | Attending: Hematology & Oncology

## 2018-05-02 ENCOUNTER — Inpatient Hospital Stay (HOSPITAL_BASED_OUTPATIENT_CLINIC_OR_DEPARTMENT_OTHER): Payer: BLUE CROSS/BLUE SHIELD | Admitting: Hematology & Oncology

## 2018-05-02 ENCOUNTER — Other Ambulatory Visit: Payer: Self-pay

## 2018-05-02 ENCOUNTER — Encounter: Payer: Self-pay | Admitting: Hematology & Oncology

## 2018-05-02 VITALS — BP 131/63 | HR 70 | Temp 97.8°F | Resp 20 | Wt 212.8 lb

## 2018-05-02 DIAGNOSIS — C9000 Multiple myeloma not having achieved remission: Secondary | ICD-10-CM | POA: Insufficient documentation

## 2018-05-02 DIAGNOSIS — M545 Low back pain: Secondary | ICD-10-CM | POA: Diagnosis not present

## 2018-05-02 DIAGNOSIS — Z79899 Other long term (current) drug therapy: Secondary | ICD-10-CM | POA: Insufficient documentation

## 2018-05-02 DIAGNOSIS — Z7984 Long term (current) use of oral hypoglycemic drugs: Secondary | ICD-10-CM | POA: Insufficient documentation

## 2018-05-02 DIAGNOSIS — D472 Monoclonal gammopathy: Secondary | ICD-10-CM

## 2018-05-02 DIAGNOSIS — M47816 Spondylosis without myelopathy or radiculopathy, lumbar region: Secondary | ICD-10-CM | POA: Diagnosis not present

## 2018-05-02 DIAGNOSIS — E1140 Type 2 diabetes mellitus with diabetic neuropathy, unspecified: Secondary | ICD-10-CM | POA: Diagnosis not present

## 2018-05-02 DIAGNOSIS — G894 Chronic pain syndrome: Secondary | ICD-10-CM | POA: Diagnosis not present

## 2018-05-02 LAB — CMP (CANCER CENTER ONLY)
ALK PHOS: 66 U/L (ref 38–126)
ALT: 22 U/L (ref 0–44)
ANION GAP: 10 (ref 5–15)
AST: 23 U/L (ref 15–41)
Albumin: 3.8 g/dL (ref 3.5–5.0)
BILIRUBIN TOTAL: 0.4 mg/dL (ref 0.3–1.2)
BUN: 17 mg/dL (ref 8–23)
CALCIUM: 9.4 mg/dL (ref 8.9–10.3)
CO2: 31 mmol/L (ref 22–32)
CREATININE: 0.86 mg/dL (ref 0.44–1.00)
Chloride: 99 mmol/L (ref 98–111)
Glucose, Bld: 155 mg/dL — ABNORMAL HIGH (ref 70–99)
Potassium: 3.3 mmol/L — ABNORMAL LOW (ref 3.5–5.1)
Sodium: 140 mmol/L (ref 135–145)
TOTAL PROTEIN: 8.1 g/dL (ref 6.5–8.1)

## 2018-05-02 LAB — CBC WITH DIFFERENTIAL (CANCER CENTER ONLY)
Basophils Absolute: 0.1 10*3/uL (ref 0.0–0.1)
Basophils Relative: 0 %
EOS ABS: 0.4 10*3/uL (ref 0.0–0.5)
Eosinophils Relative: 4 %
HCT: 38.6 % (ref 34.8–46.6)
HEMOGLOBIN: 12.1 g/dL (ref 11.6–15.9)
LYMPHS PCT: 44 %
Lymphs Abs: 4.9 10*3/uL — ABNORMAL HIGH (ref 0.9–3.3)
MCH: 27.6 pg (ref 26.0–34.0)
MCHC: 31.3 g/dL — AB (ref 32.0–36.0)
MCV: 87.9 fL (ref 81.0–101.0)
MONOS PCT: 8 %
Monocytes Absolute: 0.9 10*3/uL (ref 0.1–0.9)
NEUTROS PCT: 44 %
Neutro Abs: 5 10*3/uL (ref 1.5–6.5)
Platelet Count: 374 10*3/uL (ref 145–400)
RBC: 4.39 MIL/uL (ref 3.70–5.32)
RDW: 14 % (ref 11.1–15.7)
WBC: 11.2 10*3/uL — AB (ref 3.9–10.0)

## 2018-05-02 NOTE — Progress Notes (Signed)
Hematology and Oncology Follow Up Visit  [name] [id number] 07-Jun-1951 67 y.o. 05/02/2018   Principle Diagnosis:  1. IgG Kappa smoldering myeloma 2. Chronic neuropathy  Current Therapy:   Velcade q week dosing (3/1) -- s/p cycle #2 -- d/c on 02/06/2018 for lack of effectiveness  Interim History:  [name] is here today for a follow-up.  She is having a tough time.  She is having worsening back pain.  She needs to see her orthopedic surgeon.  She will probably see him soon.  She had back surgery back in 2005.  I would not think this is anything related to smoldering myeloma.  She is also having worsening neuropathy in her feet.  She says that the neuropathy was better on Velcade.  This surprises me a little bit.  I told her to increase the Neurontin from 600 mg p.o. nightly to 600 mg p.o. 3 times daily.  She says her diabetes is doing pretty well.  Her last hemoglobin A1c was 6.9.  Her appetite is okay.  She has had no cough or shortness of breath.  She has had no fever.  She is had no leg swelling.  She currently is on Lamisil because of foot fungus.  She sees a podiatrist for this.     Medications:  Allergies as of 05/02/2018   No Known Allergies     Medication List        Accurate as of 05/02/18  9:13 AM. Always use your most recent med list.          cyclobenzaprine 5 MG tablet Commonly known as:  FLEXERIL Take 1 tablet (5 mg total) by mouth at bedtime as needed for muscle spasms.   famciclovir 500 MG tablet Commonly known as:  FAMVIR Take 1 tablet (500 mg total) by mouth daily.   gabapentin 300 MG capsule Commonly known as:  NEURONTIN Take 600 mg by mouth at bedtime.   ibuprofen 800 MG tablet Commonly known as:  ADVIL,MOTRIN   metFORMIN 500 MG 24 hr tablet Commonly known as:  GLUCOPHAGE-XR Take 1,000 mg by mouth Twice daily. Total of 2,000 mg   metoprolol succinate 100 MG 24 hr tablet Commonly known as:  TOPROL-XL Take 100 mg by mouth  daily.   morphine 60 MG 12 hr tablet Commonly known as:  MS CONTIN Take 60 mg by mouth every 12 (twelve) hours.   neomycin-polymyxin b-dexamethasone 3.5-10000-0.1 Oint Commonly known as:  MAXITROL APPLY A SMALL AMOUNT INTO BOTH EYES AT BEDTIME   pravastatin 40 MG tablet Commonly known as:  PRAVACHOL Take 40 mg by mouth daily.   terbinafine 250 MG tablet Commonly known as:  LAMISIL Take 250 mg by mouth daily.   valsartan-hydrochlorothiazide 160-12.5 MG tablet Commonly known as:  DIOVAN-HCT Take 1 tablet by mouth daily.   venlafaxine XR 150 MG 24 hr capsule Commonly known as:  EFFEXOR-XR Take 150 mg by mouth daily with breakfast.       Allergies: No Known Allergies  Past Medical History, Surgical history, Social history, and Family History were reviewed and updated.  Review of Systems: Review of Systems  Constitutional: Positive for malaise/fatigue.  HENT: Negative.   Eyes: Negative.   Respiratory: Positive for shortness of breath.   Cardiovascular: Positive for palpitations and leg swelling.  Gastrointestinal: Positive for constipation and heartburn.  Genitourinary: Negative.   Musculoskeletal: Positive for joint pain and myalgias.  Skin: Negative.   Neurological: Positive for tingling.  Endo/Heme/Allergies: Negative.   Psychiatric/Behavioral: Negative.  Physical Exam:  weight is 212 lb 12.8 oz (96.5 kg). Her oral temperature is 97.8 F (36.6 C). Her blood pressure is 131/63 and her pulse is 70. Her respiration is 20 and oxygen saturation is 97%.   Wt Readings from Last 3 Encounters:  05/02/18 212 lb 12.8 oz (96.5 kg)  02/06/18 211 lb 12 oz (96 kg)  01/24/18 212 lb (96.2 kg)  Is 1 weight 1 1% patient is no  Physical Exam  Constitutional: She is oriented to person, place, and time.  HENT:  Head: Normocephalic and atraumatic.  Mouth/Throat: Oropharynx is clear and moist.  Eyes: Pupils are equal, round, and reactive to light. EOM are normal.  Neck:  Normal range of motion.  Cardiovascular: Normal rate, regular rhythm and normal heart sounds.  Pulmonary/Chest: Effort normal and breath sounds normal.  Abdominal: Soft. Bowel sounds are normal.  Musculoskeletal: Normal range of motion. She exhibits no edema, tenderness or deformity.  Lymphadenopathy:    She has no cervical adenopathy.  Neurological: She is alert and oriented to person, place, and time.  Skin: Skin is warm and dry. No rash noted. No erythema.  Psychiatric: She has a normal mood and affect. Her behavior is normal. Judgment and thought content normal.  Vitals reviewed.   Lab Results  Component Value Date   WBC 11.2 (H) 05/02/2018   HGB 12.1 05/02/2018   HCT 38.6 05/02/2018   MCV 87.9 05/02/2018   PLT 374 05/02/2018   Lab Results  Component Value Date   FERRITIN 79 06/06/2012   IRON 79 09/02/2011   TIBC 421 09/02/2011   UIBC 342 09/02/2011   IRONPCTSAT 19 (L) 09/02/2011   Lab Results  Component Value Date   RBC 4.39 05/02/2018   Lab Results  Component Value Date   KPAFRELGTCHN 37.9 (H) 02/06/2018   LAMBDASER 23.5 02/06/2018   KAPLAMBRATIO 1.61 02/06/2018   Lab Results  Component Value Date   IGGSERUM 1,379 02/06/2018   IGGSERUM 1,404 02/06/2018   IGA 64 (L) 02/06/2018   IGA 63 (L) 02/06/2018   IGMSERUM 39 02/06/2018   IGMSERUM 43 02/06/2018   Lab Results  Component Value Date   TOTALPROTELP 7.0 02/06/2018   ALBUMINELP 3.4 02/06/2018   A1GS 0.2 02/06/2018   A2GS 0.9 02/06/2018   BETS 1.2 02/06/2018   BETA2SER 0.3 02/20/2015   GAMS 1.3 02/06/2018   MSPIKE 0.8 (H) 02/06/2018   SPEI * 02/20/2015     Chemistry      Component Value Date/Time   NA 140 02/06/2018 1124   NA 141 01/24/2018 1213   NA 140 03/27/2017 0804   K 3.4 02/06/2018 1124   K 3.7 03/27/2017 0804   CL 101 02/06/2018 1124   CL 94 (L) 02/20/2015 1010   CO2 32 02/06/2018 1124   CO2 28 03/27/2017 0804   BUN 16 02/06/2018 1124   BUN 15 01/24/2018 1213   BUN 14.9 03/27/2017  0804   CREATININE 0.90 02/06/2018 1124   CREATININE 0.9 03/27/2017 0804      Component Value Date/Time   CALCIUM 9.4 02/06/2018 1124   CALCIUM 10.0 03/27/2017 0804   ALKPHOS 52 02/06/2018 1124   ALKPHOS 69 03/27/2017 0804   AST 25 02/06/2018 1124   AST 26 03/27/2017 0804   ALT 29 02/06/2018 1124   ALT 28 03/27/2017 0804   BILITOT 0.9 02/06/2018 1124   BILITOT 0.76 03/27/2017 0804     Impression and Plan: Ms. Noga is 67 yo white female with smoldering myeloma  and also chronic neuropathy due to diabetes and chronic back issues.   It sounds like she will need an MRI of the back.  I am sure Dr. Patrice Paradise will be able to help out with this.  We will send the metabolic panel to her podiatrist to make sure that the Lamisil is not affecting her liver.  I would like to see her back in 2 months.[name], MD 8/21/20199:13 AM

## 2018-05-03 LAB — IGG, IGA, IGM
IGA: 73 mg/dL — AB (ref 87–352)
IGG (IMMUNOGLOBIN G), SERUM: 1446 mg/dL (ref 700–1600)
IGM (IMMUNOGLOBULIN M), SRM: 47 mg/dL (ref 26–217)

## 2018-05-05 LAB — IMMUNOFIXATION REFLEX, SERUM
IgA: 71 mg/dL — ABNORMAL LOW (ref 87–352)
IgG (Immunoglobin G), Serum: 1591 mg/dL (ref 700–1600)
IgM (Immunoglobulin M), Srm: 43 mg/dL (ref 26–217)

## 2018-05-05 LAB — PROTEIN ELECTROPHORESIS, SERUM, WITH REFLEX
A/G RATIO SPE: 1.1 (ref 0.7–1.7)
ALBUMIN ELP: 3.8 g/dL (ref 2.9–4.4)
Alpha-1-Globulin: 0.2 g/dL (ref 0.0–0.4)
Alpha-2-Globulin: 0.9 g/dL (ref 0.4–1.0)
Beta Globulin: 1.1 g/dL (ref 0.7–1.3)
GAMMA GLOBULIN: 1.3 g/dL (ref 0.4–1.8)
Globulin, Total: 3.5 g/dL (ref 2.2–3.9)
M-SPIKE, %: 1 g/dL — AB
SPEP Interpretation: 0
Total Protein ELP: 7.3 g/dL (ref 6.0–8.5)

## 2018-05-16 ENCOUNTER — Other Ambulatory Visit: Payer: Self-pay | Admitting: Rehabilitation

## 2018-05-16 ENCOUNTER — Telehealth: Payer: Self-pay | Admitting: Nurse Practitioner

## 2018-05-16 ENCOUNTER — Other Ambulatory Visit: Payer: Self-pay | Admitting: Neurology

## 2018-05-16 DIAGNOSIS — M47816 Spondylosis without myelopathy or radiculopathy, lumbar region: Secondary | ICD-10-CM

## 2018-05-16 NOTE — Telephone Encounter (Signed)
Phone call to patient to verify medication list and allergies for myelogram procedure. Pt instructed to hold Effexor 48 hrs prior to appt time. Pt verbalized understanding.

## 2018-06-04 ENCOUNTER — Ambulatory Visit
Admission: RE | Admit: 2018-06-04 | Discharge: 2018-06-04 | Disposition: A | Discharge status: Home / Self Care | Payer: BLUE CROSS/BLUE SHIELD | Source: Ambulatory Visit | Attending: Rehabilitation | Admitting: Rehabilitation

## 2018-06-04 DIAGNOSIS — M48061 Spinal stenosis, lumbar region without neurogenic claudication: Secondary | ICD-10-CM | POA: Diagnosis not present

## 2018-06-04 DIAGNOSIS — M47816 Spondylosis without myelopathy or radiculopathy, lumbar region: Secondary | ICD-10-CM

## 2018-06-04 MED ORDER — IOPAMIDOL (ISOVUE-M 200) INJECTION 41%
15.0000 mL | Freq: Once | INTRAMUSCULAR | Status: AC
  Administered 2018-06-04: 15 mL via INTRATHECAL

## 2018-06-04 MED ORDER — DIAZEPAM 5 MG PO TABS
5.0000 mg | ORAL_TABLET | Freq: Once | ORAL | Status: AC
  Administered 2018-06-04: 5 mg via ORAL

## 2018-06-04 NOTE — Discharge Instructions (Signed)
Myelogram Discharge Instructions  1. Go home and rest quietly for the next 24 hours.  It is important to lie flat for the next 24 hours.  Get up only to go to the restroom.  You may lie in the bed or on a couch on your back, your stomach, your left side or your right side.  You may have one pillow under your head.  You may have pillows between your knees while you are on your side or under your knees while you are on your back.  2. DO NOT drive today.  Recline the seat as far back as it will go, while still wearing your seat belt, on the way home.  3. You may get up to go to the bathroom as needed.  You may sit up for 10 minutes to eat.  You may resume your normal diet and medications unless otherwise indicated.  Drink lots of extra fluids today and tomorrow.  4. The incidence of headache, nausea, or vomiting is about 5% (one in 20 patients).  If you develop a headache, lie flat and drink plenty of fluids until the headache goes away.  Caffeinated beverages may be helpful.  If you develop severe nausea and vomiting or a headache that does not go away with flat bed rest, call [phone].  5. You may resume normal activities after your 24 hours of bed rest is over; however, do not exert yourself strongly or do any heavy lifting tomorrow. If when you get up you have a headache when standing, go back to bed and force fluids for another 24 hours.  6. Call your physician for a follow-up appointment.  The results of your myelogram will be sent directly to your physician by the following day.  7. If you have any questions or if complications develop after you arrive home, please call [phone].  Discharge instructions have been explained to the patient.  The patient, or the person responsible for the patient, fully understands these instructions.  YOU MAY RESTART YOUR [facility] TOMORROW 06/05/2018 AT 09:30AM.

## 2018-06-05 DIAGNOSIS — M47816 Spondylosis without myelopathy or radiculopathy, lumbar region: Secondary | ICD-10-CM | POA: Diagnosis not present

## 2018-06-07 DIAGNOSIS — M47816 Spondylosis without myelopathy or radiculopathy, lumbar region: Secondary | ICD-10-CM | POA: Diagnosis not present

## 2018-06-07 DIAGNOSIS — M48062 Spinal stenosis, lumbar region with neurogenic claudication: Secondary | ICD-10-CM | POA: Diagnosis not present

## 2018-06-07 DIAGNOSIS — Z6833 Body mass index (BMI) 33.0-33.9, adult: Secondary | ICD-10-CM | POA: Diagnosis not present

## 2018-06-12 DIAGNOSIS — M961 Postlaminectomy syndrome, not elsewhere classified: Secondary | ICD-10-CM | POA: Diagnosis not present

## 2018-06-12 DIAGNOSIS — G894 Chronic pain syndrome: Secondary | ICD-10-CM | POA: Diagnosis not present

## 2018-06-12 DIAGNOSIS — Z79891 Long term (current) use of opiate analgesic: Secondary | ICD-10-CM | POA: Diagnosis not present

## 2018-06-12 DIAGNOSIS — E1142 Type 2 diabetes mellitus with diabetic polyneuropathy: Secondary | ICD-10-CM | POA: Diagnosis not present

## 2018-06-13 ENCOUNTER — Other Ambulatory Visit: Payer: Self-pay | Admitting: Hematology & Oncology

## 2018-06-26 DIAGNOSIS — I10 Essential (primary) hypertension: Secondary | ICD-10-CM | POA: Diagnosis not present

## 2018-06-26 DIAGNOSIS — E1140 Type 2 diabetes mellitus with diabetic neuropathy, unspecified: Secondary | ICD-10-CM | POA: Diagnosis not present

## 2018-06-26 DIAGNOSIS — Z01818 Encounter for other preprocedural examination: Secondary | ICD-10-CM | POA: Diagnosis not present

## 2018-06-26 DIAGNOSIS — M48062 Spinal stenosis, lumbar region with neurogenic claudication: Secondary | ICD-10-CM | POA: Diagnosis not present

## 2018-06-26 DIAGNOSIS — E782 Mixed hyperlipidemia: Secondary | ICD-10-CM | POA: Diagnosis not present

## 2018-06-26 DIAGNOSIS — Z23 Encounter for immunization: Secondary | ICD-10-CM | POA: Diagnosis not present

## 2018-07-09 ENCOUNTER — Inpatient Hospital Stay: Payer: BLUE CROSS/BLUE SHIELD

## 2018-07-09 ENCOUNTER — Other Ambulatory Visit: Payer: Self-pay

## 2018-07-09 ENCOUNTER — Inpatient Hospital Stay: Payer: BLUE CROSS/BLUE SHIELD | Attending: Hematology & Oncology | Admitting: Hematology & Oncology

## 2018-07-09 ENCOUNTER — Encounter: Payer: Self-pay | Admitting: Hematology & Oncology

## 2018-07-09 VITALS — BP 138/56 | HR 79 | Temp 98.2°F | Resp 18 | Wt 210.0 lb

## 2018-07-09 DIAGNOSIS — M48061 Spinal stenosis, lumbar region without neurogenic claudication: Secondary | ICD-10-CM | POA: Diagnosis not present

## 2018-07-09 DIAGNOSIS — C9000 Multiple myeloma not having achieved remission: Secondary | ICD-10-CM | POA: Insufficient documentation

## 2018-07-09 DIAGNOSIS — Z79899 Other long term (current) drug therapy: Secondary | ICD-10-CM | POA: Diagnosis not present

## 2018-07-09 DIAGNOSIS — E1140 Type 2 diabetes mellitus with diabetic neuropathy, unspecified: Secondary | ICD-10-CM | POA: Diagnosis not present

## 2018-07-09 DIAGNOSIS — Z7984 Long term (current) use of oral hypoglycemic drugs: Secondary | ICD-10-CM | POA: Diagnosis not present

## 2018-07-09 DIAGNOSIS — D472 Monoclonal gammopathy: Secondary | ICD-10-CM

## 2018-07-09 LAB — CMP (CANCER CENTER ONLY)
ALK PHOS: 59 U/L (ref 38–126)
ALT: 17 U/L (ref 0–44)
AST: 21 U/L (ref 15–41)
Albumin: 3.8 g/dL (ref 3.5–5.0)
Anion gap: 9 (ref 5–15)
BUN: 19 mg/dL (ref 8–23)
CALCIUM: 9.7 mg/dL (ref 8.9–10.3)
CO2: 32 mmol/L (ref 22–32)
CREATININE: 0.92 mg/dL (ref 0.44–1.00)
Chloride: 99 mmol/L (ref 98–111)
GFR, Estimated: >60 mL/min (ref >60)
GLUCOSE: 184 mg/dL — AB (ref 70–99)
Potassium: 3.3 mmol/L — ABNORMAL LOW (ref 3.5–5.1)
SODIUM: 140 mmol/L (ref 135–145)
Total Bilirubin: 0.6 mg/dL (ref 0.3–1.2)
Total Protein: 7.7 g/dL (ref 6.5–8.1)

## 2018-07-09 LAB — CBC WITH DIFFERENTIAL (CANCER CENTER ONLY)
ABS IMMATURE GRANULOCYTES: 0.03 10*3/uL (ref 0.00–0.07)
Basophils Absolute: 0.1 10*3/uL (ref 0.0–0.1)
Basophils Relative: 1 %
Eosinophils Absolute: 0.3 10*3/uL (ref 0.0–0.5)
Eosinophils Relative: 4 %
HCT: 37.3 % (ref 36.0–46.0)
HEMOGLOBIN: 11.4 g/dL — AB (ref 12.0–15.0)
Immature Granulocytes: 0 %
LYMPHS PCT: 42 %
Lymphs Abs: 4 10*3/uL (ref 0.7–4.0)
MCH: 26.1 pg (ref 26.0–34.0)
MCHC: 30.6 g/dL (ref 30.0–36.0)
MCV: 85.6 fL (ref 80.0–100.0)
MONO ABS: 0.7 10*3/uL (ref 0.1–1.0)
MONOS PCT: 8 %
NEUTROS ABS: 4.2 10*3/uL (ref 1.7–7.7)
Neutrophils Relative %: 45 %
Platelet Count: 396 10*3/uL (ref 150–400)
RBC: 4.36 MIL/uL (ref 3.87–5.11)
RDW: 13.4 % (ref 11.5–15.5)
WBC: 9.3 10*3/uL (ref 4.0–10.5)
nRBC: 0 % (ref 0.0–0.2)

## 2018-07-09 NOTE — Progress Notes (Signed)
Hematology and Oncology Follow Up Visit  [name] [id number] 1951-01-07 67 y.o. 07/09/2018   Principle Diagnosis:  1. IgG Kappa smoldering myeloma 2. Chronic neuropathy  Current Therapy:   Velcade q week dosing (3/1) -- s/p cycle #2 -- d/c on 02/06/2018 for lack of effectiveness  Interim History:  [name] is here today for a follow-up.  She actually had a myelogram done on 06/04/2018.  This, I think, shows the problem.  She has severe stenosis at L2-L3.  Also is a "functional fusion" at L1-L2.  Nothing looks like a myeloma.  She is to undergo surgery on December 16.  So far, the smoldering myeloma really is not an issue for [name].  Her last M spike was 1 g/dL.  Her IgG level was 1590 mg/dL.  Her last Kappa Lightchain was 3.8 mg/dL.  Hopefully, with surgery, the neuropathy issues will improve.  She is had the obvious sweats.  She is on gabapentin.  This really is not doing much for the neuropathy.  It might be helping her sweats and hot flashes.  There is no cough or shortness of breath.  There is no nausea or vomiting.  She has had no change in bowel or bladder habits.   Overall, her performance status is ECOG 1.   Medications:  Allergies as of 07/09/2018   No Known Allergies     Medication List        Accurate as of 07/09/18 10:46 AM. Always use your most recent med list.          cyclobenzaprine 5 MG tablet Commonly known as:  FLEXERIL Take 1 tablet (5 mg total) by mouth at bedtime as needed for muscle spasms.   gabapentin 300 MG capsule Commonly known as:  NEURONTIN Take 600 mg by mouth at bedtime.   meloxicam 15 MG tablet Commonly known as:  MOBIC Take 15 mg by mouth daily.   metFORMIN 500 MG 24 hr tablet Commonly known as:  GLUCOPHAGE-XR Take 1,000 mg by mouth Twice daily. Total of 2,000 mg   metoprolol succinate 100 MG 24 hr tablet Commonly known as:  TOPROL-XL Take 100 mg by mouth daily.   morphine 60 MG 12 hr tablet Commonly known  as:  MS CONTIN Take 60 mg by mouth every 12 (twelve) hours.   neomycin-polymyxin b-dexamethasone 3.5-10000-0.1 Oint Commonly known as:  MAXITROL APPLY A SMALL AMOUNT INTO BOTH EYES AT BEDTIME   pravastatin 40 MG tablet Commonly known as:  PRAVACHOL Take 40 mg by mouth daily.   valsartan-hydrochlorothiazide 160-12.5 MG tablet Commonly known as:  DIOVAN-HCT Take 1 tablet by mouth daily.   venlafaxine XR 150 MG 24 hr capsule Commonly known as:  EFFEXOR-XR Take 150 mg by mouth daily with breakfast.       Allergies: No Known Allergies  Past Medical History, Surgical history, Social history, and Family History were reviewed and updated.  Review of Systems: Review of Systems  Constitutional: Positive for malaise/fatigue.  HENT: Negative.   Eyes: Negative.   Respiratory: Positive for shortness of breath.   Cardiovascular: Positive for palpitations and leg swelling.  Gastrointestinal: Positive for constipation and heartburn.  Genitourinary: Negative.   Musculoskeletal: Positive for joint pain and myalgias.  Skin: Negative.   Neurological: Positive for tingling.  Endo/Heme/Allergies: Negative.   Psychiatric/Behavioral: Negative.      Physical Exam:  weight is 210 lb (95.3 kg). Her oral temperature is 98.2 F (36.8 C). Her blood pressure is 138/56 (abnormal) and her pulse is  79. Her respiration is 18 and oxygen saturation is 94%.   Wt Readings from Last 3 Encounters:  07/09/18 210 lb (95.3 kg)  05/02/18 212 lb 12.8 oz (96.5 kg)  02/06/18 211 lb 12 oz (96 kg)  Is 1 weight 1 1% patient is no  Physical Exam  Constitutional: She is oriented to person, place, and time.  HENT:  Head: Normocephalic and atraumatic.  Mouth/Throat: Oropharynx is clear and moist.  Eyes: Pupils are equal, round, and reactive to light. EOM are normal.  Neck: Normal range of motion.  Cardiovascular: Normal rate, regular rhythm and normal heart sounds.  Pulmonary/Chest: Effort normal and breath  sounds normal.  Abdominal: Soft. Bowel sounds are normal.  Musculoskeletal: Normal range of motion. She exhibits no edema, tenderness or deformity.  Lymphadenopathy:    She has no cervical adenopathy.  Neurological: She is alert and oriented to person, place, and time.  Skin: Skin is warm and dry. No rash noted. No erythema.  Psychiatric: She has a normal mood and affect. Her behavior is normal. Judgment and thought content normal.  Vitals reviewed.   Lab Results  Component Value Date   WBC 9.3 07/09/2018   HGB 11.4 (L) 07/09/2018   HCT 37.3 07/09/2018   MCV 85.6 07/09/2018   PLT 396 07/09/2018   Lab Results  Component Value Date   FERRITIN 79 06/06/2012   IRON 79 09/02/2011   TIBC 421 09/02/2011   UIBC 342 09/02/2011   IRONPCTSAT 19 (L) 09/02/2011   Lab Results  Component Value Date   RBC 4.36 07/09/2018   Lab Results  Component Value Date   KPAFRELGTCHN 37.9 (H) 02/06/2018   LAMBDASER 23.5 02/06/2018   KAPLAMBRATIO 1.61 02/06/2018   Lab Results  Component Value Date   IGGSERUM 1,446 05/02/2018   IGGSERUM 1,591 05/02/2018   IGA 73 (L) 05/02/2018   IGA 71 (L) 05/02/2018   IGMSERUM 47 05/02/2018   IGMSERUM 43 05/02/2018   Lab Results  Component Value Date   TOTALPROTELP 7.3 05/02/2018   ALBUMINELP 3.8 05/02/2018   A1GS 0.2 05/02/2018   A2GS 0.9 05/02/2018   BETS 1.1 05/02/2018   BETA2SER 0.3 02/20/2015   GAMS 1.3 05/02/2018   MSPIKE 1.0 (H) 05/02/2018   SPEI * 02/20/2015     Chemistry      Component Value Date/Time   NA 140 05/02/2018 0822   NA 141 01/24/2018 1213   NA 140 03/27/2017 0804   K 3.3 (L) 05/02/2018 0822   K 3.7 03/27/2017 0804   CL 99 05/02/2018 0822   CL 94 (L) 02/20/2015 1010   CO2 31 05/02/2018 0822   CO2 28 03/27/2017 0804   BUN 17 05/02/2018 0822   BUN 15 01/24/2018 1213   BUN 14.9 03/27/2017 0804   CREATININE 0.86 05/02/2018 0822   CREATININE 0.9 03/27/2017 0804      Component Value Date/Time   CALCIUM 9.4 05/02/2018 0822     CALCIUM 10.0 03/27/2017 0804   ALKPHOS 66 05/02/2018 0822   ALKPHOS 69 03/27/2017 0804   AST 23 05/02/2018 0822   AST 26 03/27/2017 0804   ALT 22 05/02/2018 0822   ALT 28 03/27/2017 0804   BILITOT 0.4 05/02/2018 0822   BILITOT 0.76 03/27/2017 0804     Impression and Plan: [name] is 67 yo white female with smoldering myeloma and also chronic neuropathy due to diabetes and chronic back issues.   The myelogram clearly shows the problem.  The myelogram was a great idea.  Hopefully, surgery to relieve the stenosis is going to help.  Given that she is going to have surgery in December, I probably will need to see her back in about 4 months.  By then, she should be able to get around and we should hopefully see improvement in her symptoms.    [name], MD 10/28/201910:46 AM

## 2018-07-10 LAB — IGG, IGA, IGM
IGA: 70 mg/dL — AB (ref 87–352)
IgG (Immunoglobin G), Serum: 1424 mg/dL (ref 700–1600)
IgM (Immunoglobulin M), Srm: 45 mg/dL (ref 26–217)

## 2018-07-10 LAB — KAPPA/LAMBDA LIGHT CHAINS
KAPPA FREE LGHT CHN: 36.1 mg/L — AB (ref 3.3–19.4)
KAPPA, LAMDA LIGHT CHAIN RATIO: 1.37 (ref 0.26–1.65)
Lambda free light chains: 26.3 mg/L (ref 5.7–26.3)

## 2018-07-12 LAB — PROTEIN ELECTROPHORESIS, SERUM, WITH REFLEX
A/G RATIO SPE: 1.1 (ref 0.7–1.7)
ALPHA-2-GLOBULIN: 0.8 g/dL (ref 0.4–1.0)
Albumin ELP: 3.7 g/dL (ref 2.9–4.4)
Alpha-1-Globulin: 0.2 g/dL (ref 0.0–0.4)
BETA GLOBULIN: 1.2 g/dL (ref 0.7–1.3)
GAMMA GLOBULIN: 1.3 g/dL (ref 0.4–1.8)
GLOBULIN, TOTAL: 3.5 g/dL (ref 2.2–3.9)
M-SPIKE, %: 0.9 g/dL — AB
SPEP INTERP: 0
Total Protein ELP: 7.2 g/dL (ref 6.0–8.5)

## 2018-07-12 LAB — IMMUNOFIXATION REFLEX, SERUM
IGA: 74 mg/dL — AB (ref 87–352)
IGG (IMMUNOGLOBIN G), SERUM: 1384 mg/dL (ref 700–1600)
IgM (Immunoglobulin M), Srm: 52 mg/dL (ref 26–217)

## 2018-08-06 ENCOUNTER — Other Ambulatory Visit: Payer: Self-pay | Admitting: Hematology & Oncology

## 2018-08-15 DIAGNOSIS — H903 Sensorineural hearing loss, bilateral: Secondary | ICD-10-CM | POA: Diagnosis not present

## 2018-08-15 DIAGNOSIS — Z8739 Personal history of other diseases of the musculoskeletal system and connective tissue: Secondary | ICD-10-CM | POA: Diagnosis not present

## 2018-08-15 DIAGNOSIS — H9313 Tinnitus, bilateral: Secondary | ICD-10-CM | POA: Diagnosis not present

## 2018-08-15 DIAGNOSIS — M542 Cervicalgia: Secondary | ICD-10-CM | POA: Diagnosis not present

## 2018-08-17 DIAGNOSIS — M545 Low back pain: Secondary | ICD-10-CM | POA: Diagnosis not present

## 2018-08-17 DIAGNOSIS — M4327 Fusion of spine, lumbosacral region: Secondary | ICD-10-CM | POA: Diagnosis not present

## 2018-08-17 DIAGNOSIS — M419 Scoliosis, unspecified: Secondary | ICD-10-CM | POA: Diagnosis not present

## 2018-08-20 DIAGNOSIS — E119 Type 2 diabetes mellitus without complications: Secondary | ICD-10-CM | POA: Diagnosis not present

## 2018-08-20 DIAGNOSIS — H9202 Otalgia, left ear: Secondary | ICD-10-CM | POA: Diagnosis not present

## 2018-08-20 DIAGNOSIS — Z01818 Encounter for other preprocedural examination: Secondary | ICD-10-CM | POA: Diagnosis not present

## 2018-08-20 DIAGNOSIS — Z7984 Long term (current) use of oral hypoglycemic drugs: Secondary | ICD-10-CM | POA: Diagnosis not present

## 2018-08-20 DIAGNOSIS — I10 Essential (primary) hypertension: Secondary | ICD-10-CM | POA: Diagnosis not present

## 2018-08-20 DIAGNOSIS — M961 Postlaminectomy syndrome, not elsewhere classified: Secondary | ICD-10-CM | POA: Diagnosis not present

## 2018-08-20 DIAGNOSIS — G8929 Other chronic pain: Secondary | ICD-10-CM | POA: Diagnosis not present

## 2018-08-20 DIAGNOSIS — M545 Low back pain: Secondary | ICD-10-CM | POA: Diagnosis not present

## 2018-08-22 DIAGNOSIS — B351 Tinea unguium: Secondary | ICD-10-CM | POA: Diagnosis not present

## 2018-08-22 DIAGNOSIS — E1151 Type 2 diabetes mellitus with diabetic peripheral angiopathy without gangrene: Secondary | ICD-10-CM | POA: Diagnosis not present

## 2018-08-23 DIAGNOSIS — E1142 Type 2 diabetes mellitus with diabetic polyneuropathy: Secondary | ICD-10-CM | POA: Diagnosis not present

## 2018-08-23 DIAGNOSIS — G894 Chronic pain syndrome: Secondary | ICD-10-CM | POA: Diagnosis not present

## 2018-08-23 DIAGNOSIS — M961 Postlaminectomy syndrome, not elsewhere classified: Secondary | ICD-10-CM | POA: Diagnosis not present

## 2018-08-23 DIAGNOSIS — Z79891 Long term (current) use of opiate analgesic: Secondary | ICD-10-CM | POA: Diagnosis not present

## 2018-08-27 DIAGNOSIS — M4325 Fusion of spine, thoracolumbar region: Secondary | ICD-10-CM | POA: Diagnosis not present

## 2018-08-27 DIAGNOSIS — M4716 Other spondylosis with myelopathy, lumbar region: Secondary | ICD-10-CM | POA: Diagnosis not present

## 2018-08-27 DIAGNOSIS — Y838 Other surgical procedures as the cause of abnormal reaction of the patient, or of later complication, without mention of misadventure at the time of the procedure: Secondary | ICD-10-CM | POA: Diagnosis not present

## 2018-08-27 DIAGNOSIS — M4010 Other secondary kyphosis, site unspecified: Secondary | ICD-10-CM | POA: Diagnosis not present

## 2018-08-27 DIAGNOSIS — M5136 Other intervertebral disc degeneration, lumbar region: Secondary | ICD-10-CM | POA: Insufficient documentation

## 2018-08-27 DIAGNOSIS — I10 Essential (primary) hypertension: Secondary | ICD-10-CM | POA: Diagnosis not present

## 2018-08-27 DIAGNOSIS — M461 Sacroiliitis, not elsewhere classified: Secondary | ICD-10-CM | POA: Diagnosis not present

## 2018-08-27 DIAGNOSIS — G894 Chronic pain syndrome: Secondary | ICD-10-CM | POA: Diagnosis not present

## 2018-08-27 DIAGNOSIS — M48062 Spinal stenosis, lumbar region with neurogenic claudication: Secondary | ICD-10-CM | POA: Diagnosis not present

## 2018-08-27 DIAGNOSIS — G9619 Other disorders of meninges, not elsewhere classified: Secondary | ICD-10-CM | POA: Diagnosis not present

## 2018-08-27 DIAGNOSIS — E119 Type 2 diabetes mellitus without complications: Secondary | ICD-10-CM | POA: Diagnosis not present

## 2018-08-27 DIAGNOSIS — Z981 Arthrodesis status: Secondary | ICD-10-CM | POA: Diagnosis not present

## 2018-08-27 DIAGNOSIS — M48061 Spinal stenosis, lumbar region without neurogenic claudication: Secondary | ICD-10-CM | POA: Diagnosis not present

## 2018-08-27 DIAGNOSIS — M961 Postlaminectomy syndrome, not elsewhere classified: Secondary | ICD-10-CM | POA: Diagnosis not present

## 2018-08-27 DIAGNOSIS — R110 Nausea: Secondary | ICD-10-CM | POA: Diagnosis not present

## 2018-08-27 DIAGNOSIS — M963 Postlaminectomy kyphosis: Secondary | ICD-10-CM | POA: Diagnosis not present

## 2018-08-27 DIAGNOSIS — M4036 Flatback syndrome, lumbar region: Secondary | ICD-10-CM | POA: Diagnosis not present

## 2018-08-27 DIAGNOSIS — M40295 Other kyphosis, thoracolumbar region: Secondary | ICD-10-CM | POA: Diagnosis not present

## 2018-08-27 DIAGNOSIS — Z7984 Long term (current) use of oral hypoglycemic drugs: Secondary | ICD-10-CM | POA: Diagnosis not present

## 2018-08-27 DIAGNOSIS — M479 Spondylosis, unspecified: Secondary | ICD-10-CM | POA: Diagnosis not present

## 2018-08-30 DIAGNOSIS — I10 Essential (primary) hypertension: Secondary | ICD-10-CM | POA: Diagnosis not present

## 2018-08-30 DIAGNOSIS — G8911 Acute pain due to trauma: Secondary | ICD-10-CM | POA: Diagnosis not present

## 2018-08-30 DIAGNOSIS — E1142 Type 2 diabetes mellitus with diabetic polyneuropathy: Secondary | ICD-10-CM | POA: Diagnosis not present

## 2018-08-30 DIAGNOSIS — C9000 Multiple myeloma not having achieved remission: Secondary | ICD-10-CM | POA: Insufficient documentation

## 2018-08-30 DIAGNOSIS — G8918 Other acute postprocedural pain: Secondary | ICD-10-CM | POA: Diagnosis not present

## 2018-08-30 DIAGNOSIS — M25562 Pain in left knee: Secondary | ICD-10-CM | POA: Diagnosis not present

## 2018-08-30 DIAGNOSIS — Z7409 Other reduced mobility: Secondary | ICD-10-CM | POA: Diagnosis not present

## 2018-08-30 DIAGNOSIS — Z4789 Encounter for other orthopedic aftercare: Secondary | ICD-10-CM | POA: Diagnosis not present

## 2018-08-30 DIAGNOSIS — D72829 Elevated white blood cell count, unspecified: Secondary | ICD-10-CM | POA: Diagnosis not present

## 2018-08-30 DIAGNOSIS — E872 Acidosis: Secondary | ICD-10-CM | POA: Diagnosis not present

## 2018-08-30 DIAGNOSIS — G4700 Insomnia, unspecified: Secondary | ICD-10-CM | POA: Diagnosis not present

## 2018-08-30 DIAGNOSIS — R9431 Abnormal electrocardiogram [ECG] [EKG]: Secondary | ICD-10-CM | POA: Diagnosis not present

## 2018-08-30 DIAGNOSIS — R269 Unspecified abnormalities of gait and mobility: Secondary | ICD-10-CM | POA: Diagnosis not present

## 2018-08-30 DIAGNOSIS — M40205 Unspecified kyphosis, thoracolumbar region: Secondary | ICD-10-CM | POA: Diagnosis not present

## 2018-08-30 DIAGNOSIS — Z789 Other specified health status: Secondary | ICD-10-CM | POA: Insufficient documentation

## 2018-08-30 DIAGNOSIS — M4325 Fusion of spine, thoracolumbar region: Secondary | ICD-10-CM | POA: Diagnosis not present

## 2018-08-30 DIAGNOSIS — J9811 Atelectasis: Secondary | ICD-10-CM | POA: Diagnosis not present

## 2018-08-30 DIAGNOSIS — K5900 Constipation, unspecified: Secondary | ICD-10-CM | POA: Diagnosis not present

## 2018-08-30 DIAGNOSIS — E871 Hypo-osmolality and hyponatremia: Secondary | ICD-10-CM | POA: Diagnosis not present

## 2018-08-30 DIAGNOSIS — E1143 Type 2 diabetes mellitus with diabetic autonomic (poly)neuropathy: Secondary | ICD-10-CM | POA: Diagnosis not present

## 2018-08-30 DIAGNOSIS — E1121 Type 2 diabetes mellitus with diabetic nephropathy: Secondary | ICD-10-CM | POA: Diagnosis not present

## 2018-08-30 DIAGNOSIS — R6510 Systemic inflammatory response syndrome (SIRS) of non-infectious origin without acute organ dysfunction: Secondary | ICD-10-CM | POA: Diagnosis not present

## 2018-08-30 DIAGNOSIS — M170 Bilateral primary osteoarthritis of knee: Secondary | ICD-10-CM | POA: Diagnosis not present

## 2018-08-30 DIAGNOSIS — E876 Hypokalemia: Secondary | ICD-10-CM | POA: Diagnosis not present

## 2018-08-30 DIAGNOSIS — Z981 Arthrodesis status: Secondary | ICD-10-CM | POA: Diagnosis not present

## 2018-08-30 DIAGNOSIS — E785 Hyperlipidemia, unspecified: Secondary | ICD-10-CM | POA: Diagnosis not present

## 2018-08-30 DIAGNOSIS — A419 Sepsis, unspecified organism: Secondary | ICD-10-CM | POA: Diagnosis not present

## 2018-08-30 DIAGNOSIS — G609 Hereditary and idiopathic neuropathy, unspecified: Secondary | ICD-10-CM | POA: Diagnosis not present

## 2018-08-30 DIAGNOSIS — M4036 Flatback syndrome, lumbar region: Secondary | ICD-10-CM | POA: Diagnosis not present

## 2018-08-30 DIAGNOSIS — Z79899 Other long term (current) drug therapy: Secondary | ICD-10-CM | POA: Diagnosis not present

## 2018-08-30 DIAGNOSIS — M48062 Spinal stenosis, lumbar region with neurogenic claudication: Secondary | ICD-10-CM | POA: Diagnosis not present

## 2018-08-30 DIAGNOSIS — G8929 Other chronic pain: Secondary | ICD-10-CM | POA: Diagnosis not present

## 2018-08-30 DIAGNOSIS — R531 Weakness: Secondary | ICD-10-CM | POA: Diagnosis not present

## 2018-08-31 DIAGNOSIS — D72829 Elevated white blood cell count, unspecified: Secondary | ICD-10-CM | POA: Diagnosis not present

## 2018-09-04 DIAGNOSIS — M170 Bilateral primary osteoarthritis of knee: Secondary | ICD-10-CM | POA: Diagnosis not present

## 2018-09-04 DIAGNOSIS — J9811 Atelectasis: Secondary | ICD-10-CM | POA: Diagnosis not present

## 2018-09-11 DIAGNOSIS — Z7409 Other reduced mobility: Secondary | ICD-10-CM | POA: Diagnosis not present

## 2018-09-11 MED ORDER — DEXTROSE 10 % IV SOLN
125.00 | INTRAVENOUS | Status: DC
Start: ? — End: 2018-09-11

## 2018-09-11 MED ORDER — OXYCODONE HCL 5 MG PO TABS
5.00 | ORAL_TABLET | ORAL | Status: DC
Start: ? — End: 2018-09-11

## 2018-09-11 MED ORDER — MAGNESIUM OXIDE 400 MG PO TABS
400.00 | ORAL_TABLET | ORAL | Status: DC
Start: 2018-09-11 — End: 2018-09-11

## 2018-09-11 MED ORDER — ATORVASTATIN CALCIUM 10 MG PO TABS
10.00 | ORAL_TABLET | ORAL | Status: DC
Start: 2018-09-11 — End: 2018-09-11

## 2018-09-11 MED ORDER — GLUCOSE 40 % PO GEL
15.00 | ORAL | Status: DC
Start: ? — End: 2018-09-11

## 2018-09-11 MED ORDER — BISACODYL 10 MG RE SUPP
10.00 | RECTAL | Status: DC
Start: ? — End: 2018-09-11

## 2018-09-11 MED ORDER — GLUCAGON HCL RDNA (DIAGNOSTIC) 1 MG IJ SOLR
1.00 | INTRAMUSCULAR | Status: DC
Start: ? — End: 2018-09-11

## 2018-09-11 MED ORDER — POLYETHYLENE GLYCOL 3350 17 G PO PACK
17.00 | PACK | ORAL | Status: DC
Start: 2018-09-12 — End: 2018-09-11

## 2018-09-11 MED ORDER — DIPHENHYDRAMINE HCL 25 MG PO CAPS
25.00 | ORAL_CAPSULE | ORAL | Status: DC
Start: ? — End: 2018-09-11

## 2018-09-11 MED ORDER — ACETAMINOPHEN 325 MG PO TABS
650.00 | ORAL_TABLET | ORAL | Status: DC
Start: 2018-09-11 — End: 2018-09-11

## 2018-09-11 MED ORDER — INSULIN LISPRO 100 UNIT/ML ~~LOC~~ SOLN
2.00 | SUBCUTANEOUS | Status: DC
Start: 2018-09-11 — End: 2018-09-11

## 2018-09-11 MED ORDER — METOPROLOL SUCCINATE ER 50 MG PO TB24
100.00 | ORAL_TABLET | ORAL | Status: DC
Start: 2018-09-12 — End: 2018-09-11

## 2018-09-11 MED ORDER — PANTOPRAZOLE SODIUM 40 MG PO TBEC
40.00 | DELAYED_RELEASE_TABLET | ORAL | Status: DC
Start: 2018-09-12 — End: 2018-09-11

## 2018-09-11 MED ORDER — MORPHINE SULFATE ER 30 MG PO TBCR
60.00 | EXTENDED_RELEASE_TABLET | ORAL | Status: DC
Start: 2018-09-11 — End: 2018-09-11

## 2018-09-11 MED ORDER — FERROUS GLUCONATE 324 (38 FE) MG PO TABS
324.00 | ORAL_TABLET | ORAL | Status: DC
Start: 2018-09-11 — End: 2018-09-11

## 2018-09-11 MED ORDER — GABAPENTIN 300 MG PO CAPS
300.00 | ORAL_CAPSULE | ORAL | Status: DC
Start: 2018-09-11 — End: 2018-09-11

## 2018-09-11 MED ORDER — ONDANSETRON 4 MG PO TBDP
4.00 | ORAL_TABLET | ORAL | Status: DC
Start: ? — End: 2018-09-11

## 2018-09-11 MED ORDER — SENNOSIDES-DOCUSATE SODIUM 8.6-50 MG PO TABS
2.00 | ORAL_TABLET | ORAL | Status: DC
Start: 2018-09-11 — End: 2018-09-11

## 2018-09-11 MED ORDER — NALOXONE HCL 0.4 MG/ML IJ SOLN
0.10 | INTRAMUSCULAR | Status: DC
Start: ? — End: 2018-09-11

## 2018-09-11 MED ORDER — VENLAFAXINE HCL ER 150 MG PO CP24
150.00 | ORAL_CAPSULE | ORAL | Status: DC
Start: 2018-09-12 — End: 2018-09-11

## 2018-09-18 DIAGNOSIS — E782 Mixed hyperlipidemia: Secondary | ICD-10-CM | POA: Diagnosis not present

## 2018-09-18 DIAGNOSIS — E1140 Type 2 diabetes mellitus with diabetic neuropathy, unspecified: Secondary | ICD-10-CM | POA: Diagnosis not present

## 2018-09-18 DIAGNOSIS — D72828 Other elevated white blood cell count: Secondary | ICD-10-CM | POA: Diagnosis not present

## 2018-09-18 DIAGNOSIS — I10 Essential (primary) hypertension: Secondary | ICD-10-CM | POA: Diagnosis not present

## 2018-09-19 DIAGNOSIS — M6281 Muscle weakness (generalized): Secondary | ICD-10-CM | POA: Diagnosis not present

## 2018-09-19 DIAGNOSIS — G894 Chronic pain syndrome: Secondary | ICD-10-CM | POA: Diagnosis not present

## 2018-09-19 DIAGNOSIS — M545 Low back pain: Secondary | ICD-10-CM | POA: Diagnosis not present

## 2018-09-19 DIAGNOSIS — R262 Difficulty in walking, not elsewhere classified: Secondary | ICD-10-CM | POA: Diagnosis not present

## 2018-09-26 ENCOUNTER — Inpatient Hospital Stay: Payer: BLUE CROSS/BLUE SHIELD

## 2018-09-26 ENCOUNTER — Inpatient Hospital Stay: Payer: BLUE CROSS/BLUE SHIELD | Admitting: Family

## 2018-09-26 DIAGNOSIS — R262 Difficulty in walking, not elsewhere classified: Secondary | ICD-10-CM | POA: Diagnosis not present

## 2018-09-26 DIAGNOSIS — M545 Low back pain: Secondary | ICD-10-CM | POA: Diagnosis not present

## 2018-09-26 DIAGNOSIS — G894 Chronic pain syndrome: Secondary | ICD-10-CM | POA: Diagnosis not present

## 2018-09-26 DIAGNOSIS — M6281 Muscle weakness (generalized): Secondary | ICD-10-CM | POA: Diagnosis not present

## 2018-09-28 DIAGNOSIS — M545 Low back pain: Secondary | ICD-10-CM | POA: Diagnosis not present

## 2018-09-28 DIAGNOSIS — R262 Difficulty in walking, not elsewhere classified: Secondary | ICD-10-CM | POA: Diagnosis not present

## 2018-09-28 DIAGNOSIS — M6281 Muscle weakness (generalized): Secondary | ICD-10-CM | POA: Diagnosis not present

## 2018-09-28 DIAGNOSIS — G894 Chronic pain syndrome: Secondary | ICD-10-CM | POA: Diagnosis not present

## 2018-10-01 DIAGNOSIS — M6281 Muscle weakness (generalized): Secondary | ICD-10-CM | POA: Diagnosis not present

## 2018-10-01 DIAGNOSIS — G894 Chronic pain syndrome: Secondary | ICD-10-CM | POA: Diagnosis not present

## 2018-10-01 DIAGNOSIS — R262 Difficulty in walking, not elsewhere classified: Secondary | ICD-10-CM | POA: Diagnosis not present

## 2018-10-01 DIAGNOSIS — M545 Low back pain: Secondary | ICD-10-CM | POA: Diagnosis not present

## 2018-10-03 DIAGNOSIS — M6281 Muscle weakness (generalized): Secondary | ICD-10-CM | POA: Diagnosis not present

## 2018-10-03 DIAGNOSIS — R262 Difficulty in walking, not elsewhere classified: Secondary | ICD-10-CM | POA: Diagnosis not present

## 2018-10-03 DIAGNOSIS — G894 Chronic pain syndrome: Secondary | ICD-10-CM | POA: Diagnosis not present

## 2018-10-03 DIAGNOSIS — M545 Low back pain: Secondary | ICD-10-CM | POA: Diagnosis not present

## 2018-10-03 DIAGNOSIS — Z6841 Body Mass Index (BMI) 40.0 and over, adult: Secondary | ICD-10-CM | POA: Diagnosis not present

## 2018-10-03 DIAGNOSIS — M4327 Fusion of spine, lumbosacral region: Secondary | ICD-10-CM | POA: Diagnosis not present

## 2018-10-03 DIAGNOSIS — M549 Dorsalgia, unspecified: Secondary | ICD-10-CM | POA: Diagnosis not present

## 2018-10-09 DIAGNOSIS — M545 Low back pain: Secondary | ICD-10-CM | POA: Diagnosis not present

## 2018-10-09 DIAGNOSIS — G894 Chronic pain syndrome: Secondary | ICD-10-CM | POA: Diagnosis not present

## 2018-10-09 DIAGNOSIS — R262 Difficulty in walking, not elsewhere classified: Secondary | ICD-10-CM | POA: Diagnosis not present

## 2018-10-09 DIAGNOSIS — M6281 Muscle weakness (generalized): Secondary | ICD-10-CM | POA: Diagnosis not present

## 2018-10-11 DIAGNOSIS — M6281 Muscle weakness (generalized): Secondary | ICD-10-CM | POA: Diagnosis not present

## 2018-10-11 DIAGNOSIS — G894 Chronic pain syndrome: Secondary | ICD-10-CM | POA: Diagnosis not present

## 2018-10-11 DIAGNOSIS — R262 Difficulty in walking, not elsewhere classified: Secondary | ICD-10-CM | POA: Diagnosis not present

## 2018-10-11 DIAGNOSIS — M545 Low back pain: Secondary | ICD-10-CM | POA: Diagnosis not present

## 2018-10-17 DIAGNOSIS — R262 Difficulty in walking, not elsewhere classified: Secondary | ICD-10-CM | POA: Diagnosis not present

## 2018-10-17 DIAGNOSIS — G894 Chronic pain syndrome: Secondary | ICD-10-CM | POA: Diagnosis not present

## 2018-10-17 DIAGNOSIS — M6281 Muscle weakness (generalized): Secondary | ICD-10-CM | POA: Diagnosis not present

## 2018-10-17 DIAGNOSIS — M545 Low back pain: Secondary | ICD-10-CM | POA: Diagnosis not present

## 2018-10-23 DIAGNOSIS — G609 Hereditary and idiopathic neuropathy, unspecified: Secondary | ICD-10-CM | POA: Diagnosis not present

## 2018-10-23 DIAGNOSIS — M545 Low back pain: Secondary | ICD-10-CM | POA: Diagnosis not present

## 2018-10-23 DIAGNOSIS — M546 Pain in thoracic spine: Secondary | ICD-10-CM | POA: Diagnosis not present

## 2018-10-23 DIAGNOSIS — M542 Cervicalgia: Secondary | ICD-10-CM | POA: Diagnosis not present

## 2018-10-25 DIAGNOSIS — M542 Cervicalgia: Secondary | ICD-10-CM | POA: Diagnosis not present

## 2018-10-25 DIAGNOSIS — M545 Low back pain: Secondary | ICD-10-CM | POA: Diagnosis not present

## 2018-10-25 DIAGNOSIS — M546 Pain in thoracic spine: Secondary | ICD-10-CM | POA: Diagnosis not present

## 2018-10-25 DIAGNOSIS — G609 Hereditary and idiopathic neuropathy, unspecified: Secondary | ICD-10-CM | POA: Diagnosis not present

## 2018-10-30 DIAGNOSIS — G609 Hereditary and idiopathic neuropathy, unspecified: Secondary | ICD-10-CM | POA: Diagnosis not present

## 2018-10-30 DIAGNOSIS — M546 Pain in thoracic spine: Secondary | ICD-10-CM | POA: Diagnosis not present

## 2018-10-30 DIAGNOSIS — M545 Low back pain: Secondary | ICD-10-CM | POA: Diagnosis not present

## 2018-10-30 DIAGNOSIS — M542 Cervicalgia: Secondary | ICD-10-CM | POA: Diagnosis not present

## 2018-11-08 DIAGNOSIS — M545 Low back pain: Secondary | ICD-10-CM | POA: Diagnosis not present

## 2018-11-08 DIAGNOSIS — G609 Hereditary and idiopathic neuropathy, unspecified: Secondary | ICD-10-CM | POA: Diagnosis not present

## 2018-11-08 DIAGNOSIS — M546 Pain in thoracic spine: Secondary | ICD-10-CM | POA: Diagnosis not present

## 2018-11-08 DIAGNOSIS — M542 Cervicalgia: Secondary | ICD-10-CM | POA: Diagnosis not present

## 2018-11-09 ENCOUNTER — Inpatient Hospital Stay: Payer: BLUE CROSS/BLUE SHIELD | Attending: Hematology & Oncology | Admitting: Hematology & Oncology

## 2018-11-09 ENCOUNTER — Encounter: Payer: Self-pay | Admitting: Hematology & Oncology

## 2018-11-09 ENCOUNTER — Other Ambulatory Visit: Payer: Self-pay

## 2018-11-09 ENCOUNTER — Inpatient Hospital Stay: Payer: BLUE CROSS/BLUE SHIELD

## 2018-11-09 VITALS — BP 147/85 | HR 88 | Temp 99.0°F | Resp 18 | Wt 204.0 lb

## 2018-11-09 DIAGNOSIS — D472 Monoclonal gammopathy: Secondary | ICD-10-CM

## 2018-11-09 DIAGNOSIS — Z794 Long term (current) use of insulin: Secondary | ICD-10-CM

## 2018-11-09 DIAGNOSIS — E1142 Type 2 diabetes mellitus with diabetic polyneuropathy: Secondary | ICD-10-CM | POA: Insufficient documentation

## 2018-11-09 DIAGNOSIS — C9000 Multiple myeloma not having achieved remission: Secondary | ICD-10-CM | POA: Diagnosis not present

## 2018-11-09 DIAGNOSIS — Z79899 Other long term (current) drug therapy: Secondary | ICD-10-CM | POA: Diagnosis not present

## 2018-11-09 DIAGNOSIS — D500 Iron deficiency anemia secondary to blood loss (chronic): Secondary | ICD-10-CM

## 2018-11-09 LAB — CBC WITH DIFFERENTIAL (CANCER CENTER ONLY)
Abs Immature Granulocytes: 0.02 10*3/uL (ref 0.00–0.07)
Basophils Absolute: 0.1 10*3/uL (ref 0.0–0.1)
Basophils Relative: 1 %
EOS PCT: 5 %
Eosinophils Absolute: 0.6 10*3/uL — ABNORMAL HIGH (ref 0.0–0.5)
HCT: 34.7 % — ABNORMAL LOW (ref 36.0–46.0)
Hemoglobin: 10.5 g/dL — ABNORMAL LOW (ref 12.0–15.0)
Immature Granulocytes: 0 %
Lymphocytes Relative: 38 %
Lymphs Abs: 4 10*3/uL (ref 0.7–4.0)
MCH: 25.1 pg — ABNORMAL LOW (ref 26.0–34.0)
MCHC: 30.3 g/dL (ref 30.0–36.0)
MCV: 82.8 fL (ref 80.0–100.0)
Monocytes Absolute: 0.8 10*3/uL (ref 0.1–1.0)
Monocytes Relative: 8 %
Neutro Abs: 5.1 10*3/uL (ref 1.7–7.7)
Neutrophils Relative %: 48 %
PLATELETS: 463 10*3/uL — AB (ref 150–400)
RBC: 4.19 MIL/uL (ref 3.87–5.11)
RDW: 13.7 % (ref 11.5–15.5)
WBC Count: 10.5 10*3/uL (ref 4.0–10.5)
nRBC: 0 % (ref 0.0–0.2)

## 2018-11-09 LAB — HEMOGLOBIN A1C
Hgb A1c MFr Bld: 6.1 % — ABNORMAL HIGH (ref 4.8–5.6)
Mean Plasma Glucose: 128.37 mg/dL

## 2018-11-09 LAB — CMP (CANCER CENTER ONLY)
ALT: 11 U/L (ref 0–44)
ANION GAP: 6 (ref 5–15)
AST: 17 U/L (ref 15–41)
Albumin: 4.4 g/dL (ref 3.5–5.0)
Alkaline Phosphatase: 76 U/L (ref 38–126)
BUN: 23 mg/dL (ref 8–23)
CO2: 32 mmol/L (ref 22–32)
Calcium: 9.7 mg/dL (ref 8.9–10.3)
Chloride: 99 mmol/L (ref 98–111)
Creatinine: 0.73 mg/dL (ref 0.44–1.00)
GFR, Est AFR Am: >60 mL/min (ref >60)
GFR, Estimated: >60 mL/min (ref >60)
Glucose, Bld: 151 mg/dL — ABNORMAL HIGH (ref 70–99)
Potassium: 3.3 mmol/L — ABNORMAL LOW (ref 3.5–5.1)
Sodium: 137 mmol/L (ref 135–145)
Total Bilirubin: 0.5 mg/dL (ref 0.3–1.2)
Total Protein: 8.3 g/dL — ABNORMAL HIGH (ref 6.5–8.1)

## 2018-11-09 NOTE — Progress Notes (Signed)
Hematology and Oncology Follow Up Visit  [name] [id number] 04-20-1951 68 y.o. 11/09/2018   Principle Diagnosis:  1. IgG Kappa smoldering myeloma 2. Chronic neuropathy  Current Therapy:   Velcade q week dosing (3/1) -- s/p cycle #2 -- d/c on 02/06/2018 for lack of effectiveness  Interim History:  [name] is here today for a follow-up.  She actually had back surgery since I last saw her.  She had a fairly extensive back surgery.  I will know she had a complete fusion from T10 down to L1.  This was done at [facility].  She feels better though.  The neuropathy in her feet is not nearly as bad.  Surprisingly, she does not sweat as much.  When we last saw her in October, her M spike was 0.9 g/dL.  Her IgG level we saw her today was 1225 mg/dL.  This is holding pretty steady.  She has had no cough.  There is been no infections.  She is had no change in bowel or bladder habits.  She has had no bleeding.  Overall, her performance status is ECOG 1.   Medications:  Allergies as of 11/09/2018   No Known Allergies     Medication List       Accurate as of November 09, 2018 12:28 PM. Always use your most recent med list.        metoprolol succinate 100 MG 24 hr tablet Commonly known as:  TOPROL-XL Take 100 mg by mouth daily.   morphine 60 MG 12 hr tablet Commonly known as:  MS CONTIN Take 60 mg by mouth every 12 (twelve) hours.   pravastatin 40 MG tablet Commonly known as:  PRAVACHOL Take 40 mg by mouth daily.   valsartan-hydrochlorothiazide 160-12.5 MG tablet Commonly known as:  DIOVAN-HCT Take 1 tablet by mouth daily.   venlafaxine XR 150 MG 24 hr capsule Commonly known as:  EFFEXOR-XR Take 150 mg by mouth daily with breakfast.       Allergies: No Known Allergies  Past Medical History, Surgical history, Social history, and Family History were reviewed and updated.  Review of Systems: Review of Systems  Constitutional: Positive  for malaise/fatigue.  HENT: Negative.   Eyes: Negative.   Respiratory: Positive for shortness of breath.   Cardiovascular: Positive for palpitations and leg swelling.  Gastrointestinal: Positive for constipation and heartburn.  Genitourinary: Negative.   Musculoskeletal: Positive for joint pain and myalgias.  Skin: Negative.   Neurological: Positive for tingling.  Endo/Heme/Allergies: Negative.   Psychiatric/Behavioral: Negative.      Physical Exam:  weight is 204 lb (92.5 kg). Her oral temperature is 99 F (37.2 C). Her blood pressure is 147/85 (abnormal) and her pulse is 88. Her respiration is 18 and oxygen saturation is 95%.   Wt Readings from Last 3 Encounters:  11/09/18 204 lb (92.5 kg)  07/09/18 210 lb (95.3 kg)  05/02/18 212 lb 12.8 oz (96.5 kg)  Is 1 weight 1 1% patient is no  Physical Exam Vitals signs reviewed.  HENT:     Head: Normocephalic and atraumatic.  Eyes:     Pupils: Pupils are equal, round, and reactive to light.  Neck:     Musculoskeletal: Normal range of motion.  Cardiovascular:     Rate and Rhythm: Normal rate and regular rhythm.     Heart sounds: Normal heart sounds.  Pulmonary:     Effort: Pulmonary effort is normal.     Breath sounds: Normal breath  sounds.  Abdominal:     General: Bowel sounds are normal.     Palpations: Abdomen is soft.  Musculoskeletal: Normal range of motion.        General: No tenderness or deformity.  Lymphadenopathy:     Cervical: No cervical adenopathy.  Skin:    General: Skin is warm and dry.     Findings: No erythema or rash.  Neurological:     Mental Status: She is alert and oriented to person, place, and time.  Psychiatric:        Behavior: Behavior normal.        Thought Content: Thought content normal.        Judgment: Judgment normal.     Lab Results  Component Value Date   WBC 10.5 11/09/2018   HGB 10.5 (L) 11/09/2018   HCT 34.7 (L) 11/09/2018   MCV 82.8 11/09/2018   PLT 463 (H) 11/09/2018    Lab Results  Component Value Date   FERRITIN 79 06/06/2012   IRON 79 09/02/2011   TIBC 421 09/02/2011   UIBC 342 09/02/2011   IRONPCTSAT 19 (L) 09/02/2011   Lab Results  Component Value Date   RBC 4.19 11/09/2018   Lab Results  Component Value Date   KPAFRELGTCHN 36.1 (H) 07/09/2018   LAMBDASER 26.3 07/09/2018   KAPLAMBRATIO 1.37 07/09/2018   Lab Results  Component Value Date   IGGSERUM 1,384 07/09/2018   IGA 74 (L) 07/09/2018   IGMSERUM 52 07/09/2018   Lab Results  Component Value Date   TOTALPROTELP 7.2 07/09/2018   ALBUMINELP 3.7 07/09/2018   A1GS 0.2 07/09/2018   A2GS 0.8 07/09/2018   BETS 1.2 07/09/2018   BETA2SER 0.3 02/20/2015   GAMS 1.3 07/09/2018   MSPIKE 0.9 (H) 07/09/2018   SPEI * 02/20/2015     Chemistry      Component Value Date/Time   NA 137 11/09/2018 1120   NA 141 01/24/2018 1213   NA 140 03/27/2017 0804   K 3.3 (L) 11/09/2018 1120   K 3.7 03/27/2017 0804   CL 99 11/09/2018 1120   CL 94 (L) 02/20/2015 1010   CO2 32 11/09/2018 1120   CO2 28 03/27/2017 0804   BUN 23 11/09/2018 1120   BUN 15 01/24/2018 1213   BUN 14.9 03/27/2017 0804   CREATININE 0.73 11/09/2018 1120   CREATININE 0.9 03/27/2017 0804      Component Value Date/Time   CALCIUM 9.7 11/09/2018 1120   CALCIUM 10.0 03/27/2017 0804   ALKPHOS 76 11/09/2018 1120   ALKPHOS 69 03/27/2017 0804   AST 17 11/09/2018 1120   AST 26 03/27/2017 0804   ALT 11 11/09/2018 1120   ALT 28 03/27/2017 0804   BILITOT 0.5 11/09/2018 1120   BILITOT 0.76 03/27/2017 0804     Impression and Plan: [name] is 68 yo white female with smoldering myeloma and also chronic neuropathy due to diabetes and chronic back issues.   I am glad that she is doing better with her surgery.  Hopefully, she will continue to improve.  We will continue to follow her M spike.  She has smoldering myeloma.  We did try treatment with her but this really did not benefit her.  I will plan to get her back in about 7  weeks.  We will check her myeloma levels at that time.      [name], MD 2/28/202012:28 PM

## 2018-11-10 LAB — IGG, IGA, IGM
IgA: 78 mg/dL — ABNORMAL LOW (ref 87–352)
IgG (Immunoglobin G), Serum: 1725 mg/dL — ABNORMAL HIGH (ref 700–1600)
IgM (Immunoglobulin M), Srm: 64 mg/dL (ref 26–217)

## 2018-11-12 ENCOUNTER — Other Ambulatory Visit: Payer: Self-pay | Admitting: *Deleted

## 2018-11-12 DIAGNOSIS — D500 Iron deficiency anemia secondary to blood loss (chronic): Secondary | ICD-10-CM

## 2018-11-12 LAB — KAPPA/LAMBDA LIGHT CHAINS
KAPPA, LAMDA LIGHT CHAIN RATIO: 1.59 (ref 0.26–1.65)
Kappa free light chain: 39.9 mg/L — ABNORMAL HIGH (ref 3.3–19.4)
Lambda free light chains: 25.1 mg/L (ref 5.7–26.3)

## 2018-11-12 MED ORDER — FUSION PLUS PO CAPS: 1.0000 | ORAL_CAPSULE | Freq: Every day | ORAL | 6 refills | Status: DC

## 2018-11-12 MED ORDER — FUSION PLUS PO CAPS: 1.0000 | ORAL_CAPSULE | Freq: Every day | ORAL | 11 refills | Status: DC

## 2018-11-12 NOTE — Addendum Note (Signed)
Addended by: [name] on: 11/12/2018 04:58 PM   Modules accepted: Orders

## 2018-11-14 LAB — PROTEIN ELECTROPHORESIS, SERUM, WITH REFLEX
A/G Ratio: 0.8 (ref 0.7–1.7)
Albumin ELP: 3.4 g/dL (ref 2.9–4.4)
Alpha-1-Globulin: 0.2 g/dL (ref 0.0–0.4)
Alpha-2-Globulin: 1 g/dL (ref 0.4–1.0)
Beta Globulin: 1.2 g/dL (ref 0.7–1.3)
Gamma Globulin: 1.9 g/dL — ABNORMAL HIGH (ref 0.4–1.8)
Globulin, Total: 4.4 g/dL — ABNORMAL HIGH (ref 2.2–3.9)
M-Spike, %: 1.2 g/dL — ABNORMAL HIGH
SPEP Interpretation: 0
Total Protein ELP: 7.8 g/dL (ref 6.0–8.5)

## 2018-11-14 LAB — IMMUNOFIXATION REFLEX, SERUM
IgA: 94 mg/dL (ref 87–352)
IgG (Immunoglobin G), Serum: 2056 mg/dL — ABNORMAL HIGH (ref 700–1600)
IgM (Immunoglobulin M), Srm: 68 mg/dL (ref 26–217)

## 2018-11-15 DIAGNOSIS — M546 Pain in thoracic spine: Secondary | ICD-10-CM | POA: Diagnosis not present

## 2018-11-15 DIAGNOSIS — M545 Low back pain: Secondary | ICD-10-CM | POA: Diagnosis not present

## 2018-11-15 DIAGNOSIS — M542 Cervicalgia: Secondary | ICD-10-CM | POA: Diagnosis not present

## 2018-11-15 DIAGNOSIS — G609 Hereditary and idiopathic neuropathy, unspecified: Secondary | ICD-10-CM | POA: Diagnosis not present

## 2018-12-18 DIAGNOSIS — G894 Chronic pain syndrome: Secondary | ICD-10-CM | POA: Diagnosis not present

## 2018-12-18 DIAGNOSIS — E1142 Type 2 diabetes mellitus with diabetic polyneuropathy: Secondary | ICD-10-CM | POA: Diagnosis not present

## 2018-12-18 DIAGNOSIS — M961 Postlaminectomy syndrome, not elsewhere classified: Secondary | ICD-10-CM | POA: Diagnosis not present

## 2018-12-18 DIAGNOSIS — Z79891 Long term (current) use of opiate analgesic: Secondary | ICD-10-CM | POA: Diagnosis not present

## 2018-12-27 ENCOUNTER — Ambulatory Visit: Payer: BLUE CROSS/BLUE SHIELD | Admitting: Hematology & Oncology

## 2018-12-27 ENCOUNTER — Other Ambulatory Visit: Payer: BLUE CROSS/BLUE SHIELD

## 2019-01-09 DIAGNOSIS — M546 Pain in thoracic spine: Secondary | ICD-10-CM | POA: Diagnosis not present

## 2019-01-09 DIAGNOSIS — G894 Chronic pain syndrome: Secondary | ICD-10-CM | POA: Diagnosis not present

## 2019-01-09 DIAGNOSIS — M4327 Fusion of spine, lumbosacral region: Secondary | ICD-10-CM | POA: Diagnosis not present

## 2019-01-19 ENCOUNTER — Other Ambulatory Visit: Payer: Self-pay

## 2019-01-19 ENCOUNTER — Emergency Department (HOSPITAL_BASED_OUTPATIENT_CLINIC_OR_DEPARTMENT_OTHER): Payer: BLUE CROSS/BLUE SHIELD

## 2019-01-19 ENCOUNTER — Emergency Department (HOSPITAL_BASED_OUTPATIENT_CLINIC_OR_DEPARTMENT_OTHER)
Admission: EM | Admit: 2019-01-19 | Discharge: 2019-01-19 | Disposition: A | Discharge status: Home / Self Care | Payer: BLUE CROSS/BLUE SHIELD | Attending: Emergency Medicine | Admitting: Emergency Medicine

## 2019-01-19 ENCOUNTER — Encounter (HOSPITAL_BASED_OUTPATIENT_CLINIC_OR_DEPARTMENT_OTHER): Payer: Self-pay | Admitting: Emergency Medicine

## 2019-01-19 DIAGNOSIS — Z03818 Encounter for observation for suspected exposure to other biological agents ruled out: Secondary | ICD-10-CM | POA: Insufficient documentation

## 2019-01-19 DIAGNOSIS — I10 Essential (primary) hypertension: Secondary | ICD-10-CM | POA: Insufficient documentation

## 2019-01-19 DIAGNOSIS — J45909 Unspecified asthma, uncomplicated: Secondary | ICD-10-CM | POA: Diagnosis not present

## 2019-01-19 DIAGNOSIS — Z79899 Other long term (current) drug therapy: Secondary | ICD-10-CM | POA: Diagnosis not present

## 2019-01-19 DIAGNOSIS — R42 Dizziness and giddiness: Secondary | ICD-10-CM | POA: Diagnosis not present

## 2019-01-19 DIAGNOSIS — H8110 Benign paroxysmal vertigo, unspecified ear: Secondary | ICD-10-CM | POA: Diagnosis not present

## 2019-01-19 DIAGNOSIS — E119 Type 2 diabetes mellitus without complications: Secondary | ICD-10-CM | POA: Insufficient documentation

## 2019-01-19 DIAGNOSIS — R51 Headache: Secondary | ICD-10-CM | POA: Diagnosis not present

## 2019-01-19 DIAGNOSIS — R531 Weakness: Secondary | ICD-10-CM | POA: Diagnosis not present

## 2019-01-19 DIAGNOSIS — R55 Syncope and collapse: Secondary | ICD-10-CM | POA: Diagnosis not present

## 2019-01-19 DIAGNOSIS — S0990XA Unspecified injury of head, initial encounter: Secondary | ICD-10-CM | POA: Diagnosis not present

## 2019-01-19 DIAGNOSIS — R05 Cough: Secondary | ICD-10-CM | POA: Diagnosis not present

## 2019-01-19 LAB — URINALYSIS, ROUTINE W REFLEX MICROSCOPIC
Bilirubin Urine: NEGATIVE
Glucose, UA: NEGATIVE mg/dL
Hgb urine dipstick: NEGATIVE
Ketones, ur: NEGATIVE mg/dL
Nitrite: NEGATIVE
Protein, ur: NEGATIVE mg/dL
Specific Gravity, Urine: 1.015 (ref 1.005–1.030)
pH: 7 (ref 5.0–8.0)

## 2019-01-19 LAB — CBC
HCT: 44.7 % (ref 36.0–46.0)
Hemoglobin: 14.2 g/dL (ref 12.0–15.0)
MCH: 26.9 pg (ref 26.0–34.0)
MCHC: 31.8 g/dL (ref 30.0–36.0)
MCV: 84.7 fL (ref 80.0–100.0)
Platelets: 405 10*3/uL — ABNORMAL HIGH (ref 150–400)
RBC: 5.28 MIL/uL — ABNORMAL HIGH (ref 3.87–5.11)
RDW: 17.3 % — ABNORMAL HIGH (ref 11.5–15.5)
WBC: 12.4 10*3/uL — ABNORMAL HIGH (ref 4.0–10.5)
nRBC: 0 % (ref 0.0–0.2)

## 2019-01-19 LAB — BASIC METABOLIC PANEL
Anion gap: 7 (ref 5–15)
BUN: 12 mg/dL (ref 8–23)
CO2: 32 mmol/L (ref 22–32)
Calcium: 9.4 mg/dL (ref 8.9–10.3)
Chloride: 98 mmol/L (ref 98–111)
Creatinine, Ser: 0.67 mg/dL (ref 0.44–1.00)
GFR calc Af Amer: >60 mL/min (ref >60)
GFR calc non Af Amer: >60 mL/min (ref >60)
Glucose, Bld: 151 mg/dL — ABNORMAL HIGH (ref 70–99)
Potassium: 3 mmol/L — ABNORMAL LOW (ref 3.5–5.1)
Sodium: 137 mmol/L (ref 135–145)

## 2019-01-19 LAB — SARS CORONAVIRUS 2 AG (30 MIN TAT): SARS Coronavirus 2 Ag: NEGATIVE

## 2019-01-19 LAB — URINALYSIS, MICROSCOPIC (REFLEX)

## 2019-01-19 LAB — CBG MONITORING, ED: Glucose-Capillary: 149 mg/dL — ABNORMAL HIGH (ref 70–99)

## 2019-01-19 MED ORDER — MECLIZINE HCL 25 MG PO TABS: 25.0000 mg | ORAL_TABLET | Freq: Three times a day (TID) | ORAL | 0 refills | Status: DC | PRN

## 2019-01-19 MED ORDER — SODIUM CHLORIDE 0.9 % IV BOLUS
500.0000 mL | Freq: Once | INTRAVENOUS | Status: AC
  Administered 2019-01-19: 14:00:00 500 mL via INTRAVENOUS

## 2019-01-19 MED ORDER — POTASSIUM CHLORIDE CRYS ER 20 MEQ PO TBCR
40.0000 meq | EXTENDED_RELEASE_TABLET | Freq: Once | ORAL | Status: AC
  Administered 2019-01-19: 40 meq via ORAL
  Filled 2019-01-19: qty 2

## 2019-01-19 NOTE — ED Triage Notes (Signed)
Dizziness since Wednesday. States she was so dizzy this morning she fell. She c/o back pain. Was sent by [facility] for eval. Denies chest pain.

## 2019-01-19 NOTE — ED Provider Notes (Signed)
[facility] EMERGENCY DEPARTMENT Provider Note   CSN: [id number] Arrival date & time: 01/19/19  1248    History   Chief Complaint Chief Complaint  Patient presents with  . Dizziness    HPI [name] is a 68 y.o. female.     Patient is a 68 year old female with a history of diabetes, hypertension, hyperlipidemia and multiple myeloma who presents with dizziness.  She reports a 3-day history of intermittent episodes of dizziness.  Over the last couple days, the frequency has increased.  She reports that she has had episodes of dizziness when she stands up and she feels like the room spinning.  It takes her a few minutes to get her balance.  She is also had episodes where she is just been laying in bed and she gets suddenly dizzy.  This morning she was walking alongside her bed and she felt okay when she first got out of bed but then as she took a few more steps she had a sudden onset of dizziness and actually fell to the ground.  She does not think that she actually passed out.  She landed on her face but denies any facial tenderness.  She has a slight headache which seems to improve since it first started.  She denies any vision changes.  No numbness or weakness in her extremities other than she has some chronic numbness in both of her lower extremities which is worse on the left.  She also has some chronic weakness in her left leg from prior back issues.  She had recent back surgery last year.  She said she feels a little sore on the right side of her back from where she fell but does not feel like she significantly injured anything.  She denies any other injuries from the fall.  She denies any recent illnesses.  She does have some nasal congestion and coughing which she attributes to allergies but denies any known fevers.  She got a little nauseated today with the dizziness but no vomiting.  No diarrhea.  No urinary symptoms.  No chest pain or palpitations.     Past Medical  History:  Diagnosis Date  . Asthma   . Diabetes ([place])   . High cholesterol   . Hypertension   . Neuropathy associated with MGUS ([place])   . 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 Impaired mobility and activities of daily living 08/30/2018  . Multiple myeloma ([place]) 08/30/2018  . S/P spinal fusion 08/30/2018  . Lumbar degenerative disc disease 08/27/2018  . History of degenerative disc disease 08/15/2018  . Neck pain 08/15/2018  . Sensorineural hearing loss (SNHL), bilateral 08/15/2018  . Tinnitus of both ears 08/15/2018  . Smoldering multiple myeloma ([place]) 12/04/2017  . Breakdown (mechanical) of implanted electronic neurostimulator, generator, initial encounter ([place]) 08/12/2016  . Myalgia 08/12/2016  . Post laminectomy syndrome 08/12/2016  . Migraine with aura and without status migrainosus, not intractable 10/15/2015  . Intrinsic asthma 01/11/2010  . Dyspnea 12/14/2009  . DIABETES MELLITUS, TYPE II 12/11/2009  . Hyperlipidemia 12/11/2009  . MIGRAINE HEADACHE 12/11/2009  . Hereditary and idiopathic peripheral neuropathy 12/11/2009  . Hypertension 12/11/2009  . Insomnia 12/11/2009  . EDEMA 12/11/2009  . Type 2 diabetes mellitus with diabetic polyneuropathy ([place]) 12/11/2009    Past Surgical History:  Procedure Laterality Date  . BACK SURGERY    . NASAL SINUS SURGERY    . NECK  SURGERY       OB History   No obstetric history on file.      Home Medications    Prior to Admission medications   Medication Sig Start Date End Date Taking? Authorizing Provider  Iron-FA-B Cmp-C-Biot-Probiotic (FUSION PLUS) CAPS Take 1 tablet by mouth daily. 11/12/18   [name], MD  Iron-FA-B Cmp-C-Biot-Probiotic (FUSION PLUS) CAPS Take 1 capsule by mouth daily. 11/12/18   [name], MD  metoprolol (TOPROL-XL) 100 MG 24 hr tablet Take 100 mg by mouth daily.      [provider]  morphine (MS CONTIN) 60 MG 12 hr  tablet Take 60 mg by mouth every 12 (twelve) hours.  08/05/14   [provider]  pravastatin (PRAVACHOL) 40 MG tablet Take 40 mg by mouth daily. 06/28/14   [provider]  valsartan-hydrochlorothiazide (DIOVAN-HCT) 160-12.5 MG per tablet Take 1 tablet by mouth daily.    [provider]  venlafaxine XR (EFFEXOR-XR) 150 MG 24 hr capsule Take 150 mg by mouth daily with breakfast.    [provider]  prochlorperazine (COMPAZINE) 10 MG tablet Take 1 tablet (10 mg total) by mouth every 6 (six) hours as needed (Nausea or vomiting). 12/06/17 02/06/18  [name], MD    Family History Family History  Problem Relation Age of Onset  . Heart disease Mother   . Leukemia Father   . Hypertension Sister   . Stroke Maternal Grandfather   . Stroke Maternal Grandmother   . Healthy Son   . Healthy Daughter     Social History Social History   Tobacco Use  . Smoking status: Never Smoker  . Smokeless tobacco: Never Used  . Tobacco comment: never used tobacco  Substance Use Topics  . Alcohol use: No    Alcohol/week: 0.0 standard drinks  . Drug use: No     Allergies   Patient has no known allergies.   Review of Systems Review of Systems  Constitutional: Positive for fatigue. Negative for chills, diaphoresis and fever.  HENT: Positive for congestion, postnasal drip and rhinorrhea. Negative for sneezing.   Eyes: Negative.   Respiratory: Positive for cough. Negative for chest tightness and shortness of breath.   Cardiovascular: Negative for chest pain and leg swelling.  Gastrointestinal: Positive for nausea. Negative for abdominal pain, blood in stool, diarrhea and vomiting.  Genitourinary: Negative for difficulty urinating, flank pain, frequency and hematuria.  Musculoskeletal: Positive for back pain (Chronic). Negative for arthralgias.  Skin: Negative for rash.  Neurological: Positive for dizziness, weakness (Chronic left leg weakness), numbness (Chronic  bilateral leg numbness) and headaches. Negative for speech difficulty.     Physical Exam Updated Vital Signs BP (!) 149/63   Pulse 72   Temp 99.7 F (37.6 C) (Oral)   Resp 11   Ht _0  (1.6 m)   Wt 93.4 kg   SpO2 95%   BMI 36.49 kg/m   Physical Exam Constitutional:      Appearance: She is well-developed.  HENT:     Head: Normocephalic and atraumatic.     Right Ear: Tympanic membrane normal.     Left Ear: Tympanic membrane normal.     Nose: Nose normal.  Eyes:     Conjunctiva/sclera: Conjunctivae normal.     Pupils: Pupils are equal, round, and reactive to light.     Comments: No significant nystagmus noted  Neck:     Musculoskeletal: Normal range of motion and neck supple.  Comments: No pain along the cervical thoracic or lumbosacral spine.  There is some mild tenderness in the musculature of the right lumbar area. Cardiovascular:     Rate and Rhythm: Normal rate and regular rhythm.     Heart sounds: Normal heart sounds.  Pulmonary:     Effort: Pulmonary effort is normal. No respiratory distress.     Breath sounds: Normal breath sounds. No wheezing or rales.  Chest:     Chest wall: No tenderness.  Abdominal:     General: Bowel sounds are normal.     Palpations: Abdomen is soft.     Tenderness: There is no abdominal tenderness. There is no guarding or rebound.  Musculoskeletal: Normal range of motion.     Comments: No pain on palpation or range of motion of the extremities  Lymphadenopathy:     Cervical: No cervical adenopathy.  Skin:    General: Skin is warm and dry.     Findings: No rash.  Neurological:     Mental Status: She is alert and oriented to person, place, and time.     Comments: Cranial nerves II through XII grossly intact, visual fields full to confrontation, motor 5 out of 5 in all extremities other than the left lower extremity which is 4 out of 5.  Sensation is diminished to light touch in the lower extremities bilaterally which patient says is  chronic.  It is normal to light touch in upper extremities.  Finger-to-nose is intact.      ED Treatments / Results  Labs (all labs ordered are listed, but only abnormal results are displayed) Labs Reviewed  BASIC METABOLIC PANEL - Abnormal; Notable for the following components:      Result Value   Potassium 3.0 (*)    Glucose, Bld 151 (*)    All other components within normal limits  CBC - Abnormal; Notable for the following components:   WBC 12.4 (*)    RBC 5.28 (*)    RDW 17.3 (*)    Platelets 405 (*)    All other components within normal limits  CBG MONITORING, ED - Abnormal; Notable for the following components:   Glucose-Capillary 149 (*)    All other components within normal limits  SARS CORONAVIRUS 2 (HOSP ORDER, PERFORMED IN [place] [facility] VIA ABBOTT ID)  URINALYSIS, ROUTINE W REFLEX MICROSCOPIC    EKG EKG Interpretation  Date/Time:  Saturday Jan 19 2019 13:27:58 EDT Ventricular Rate:  73 PR Interval:    QRS Duration: 99 QT Interval:  433 QTC Calculation: 478 R Axis:   79 Text Interpretation:  Sinus rhythm Borderline prolonged PR interval Baseline wander in lead(s) II III aVF since last tracing no significant change Confirmed by Malvin Johns [phone]) on 01/19/2019 1:42:54 PM   Radiology Dg Chest 2 View  Result Date: 01/19/2019 CLINICAL DATA:  Cough, dizziness, hypertension EXAM: CHEST - 2 VIEW COMPARISON:  01/24/2018 FINDINGS: Normal heart size and vascularity. Similar chronic basilar vascular and interstitial prominence as before. No definite superimposed pneumonia, collapse or consolidation. Negative for edema, effusion or pneumothorax. Trachea is midline. Degenerative changes and prior fusion of the spine. IMPRESSION: Stable chest exam.  No interval change or acute process. Electronically Signed   By: Jerilynn Mages.  Shick M.D.   On: 01/19/2019 13:58   Ct Head Wo Contrast  Result Date: 01/19/2019 CLINICAL DATA:  69 year old female with acute head injury from fall today.  Dizziness for 3 days. EXAM: CT HEAD WITHOUT CONTRAST TECHNIQUE: Contiguous axial images were  obtained from the base of the skull through the vertex without intravenous contrast. COMPARISON:  05/24/2016 neck CT FINDINGS: Brain: No evidence of acute infarction, hemorrhage, hydrocephalus, extra-axial collection or mass lesion/mass effect. Atrophy and mild white matter hypodensities probably represents mild chronic small-vessel ischemic changes. Vascular: No hyperdense vessel or unexpected calcification. Skull: Normal. Negative for fracture or focal lesion. Sinuses/Orbits: Mucosal thickening within the sphenoid sinuses noted as well as fluid in the LEFT sphenoid sinus. Other: None. IMPRESSION: 1. No evidence of acute intracranial abnormality. 2. Atrophy and probable mild chronic small-vessel white matter ischemic changes. 3. Sphenoid sinus disease/sinusitis. Electronically Signed   By: Margarette Canada M.D.   On: 01/19/2019 13:58    Procedures Procedures (including critical care time)  Medications Ordered in ED Medications  potassium chloride SA (K-DUR) CR tablet 40 mEq (40 mEq Oral Given 01/19/19 1354)  sodium chloride 0.9 % bolus 500 mL (500 mLs Intravenous New Bag/Given 01/19/19 1356)     Initial Impression / Assessment and Plan / ED Course  I have reviewed the triage vital signs and the nursing notes.  Pertinent labs & imaging results that were available during my care of the patient were reviewed by me and considered in my medical decision making (see chart for details).        Patient is a 68 year old female who presents with dizziness.  Is unclear whether this is more vertiginous or lightheadedness.  It seems to be worse when she stands up although she is had some episodes when she lies down it does seem to be a little worse when she moves her head from side to side.  I do not find any distinct nystagmus.  She has no focal neurologic deficits.  No associated headache currently.  Her head CT is  negative.  Her labs show a degree of hypokalemia but otherwise unremarkable.  She was given some potassium replacement in the ED.  Her urinalysis is pending.  Her COVID test is negative.  Her chest x-ray is clear without evidence of pneumonia or fluid overload.  She is being given a small amount of IV fluids.  Will need reassessment after her urinalysis results and fluids.  Dr. Stark Jock to take over care pending these results.  Final Clinical Impressions(s) / ED Diagnoses   Final diagnoses:  None    ED Discharge Orders    None       Malvin Johns, MD 01/19/19 [phone]

## 2019-01-19 NOTE — ED Notes (Signed)
Patient transported to CT 

## 2019-01-19 NOTE — ED Notes (Signed)
Patient transported to X-ray 

## 2019-01-19 NOTE — ED Notes (Signed)
Up to BR, gait steady, denies dizziness ?

## 2019-01-19 NOTE — Discharge Instructions (Signed)
Meclizine as prescribed as needed for dizziness.  Return to the ER if you develop severe headache, visual changes, weakness, or other new and concerning symptoms.

## 2019-01-19 NOTE — ED Notes (Signed)
ED Provider at bedside. 

## 2019-01-19 NOTE — ED Provider Notes (Signed)
Care assumed from Dr. Tamera Punt at shift change.  Patient presents here with dizziness that is intermittent, episodic, and occurs with movement.  Her symptoms are most consistent with a peripheral vertigo.  Her work-up shows no significant abnormality with the exception of a potassium of 3.0.  This was replaced with oral potassium.  Patient was also given IV fluids and is now feeling significantly better and requesting to go home.  Urinalysis was outstanding at shift change.  It has returned and is not concerning for infection.  Patient has no urinary symptoms.  She will be discharged with meclizine and follow-up as needed.   Veryl Speak, MD 01/19/19 [phone]

## 2019-02-19 ENCOUNTER — Encounter: Payer: Self-pay | Admitting: Hematology & Oncology

## 2019-02-19 ENCOUNTER — Inpatient Hospital Stay: Payer: BC Managed Care – PPO | Attending: Hematology & Oncology

## 2019-02-19 ENCOUNTER — Inpatient Hospital Stay (HOSPITAL_BASED_OUTPATIENT_CLINIC_OR_DEPARTMENT_OTHER): Payer: BC Managed Care – PPO | Admitting: Hematology & Oncology

## 2019-02-19 ENCOUNTER — Other Ambulatory Visit: Payer: Self-pay

## 2019-02-19 VITALS — BP 140/58 | HR 94 | Temp 98.6°F | Resp 18 | Wt 209.5 lb

## 2019-02-19 DIAGNOSIS — R0602 Shortness of breath: Secondary | ICD-10-CM | POA: Insufficient documentation

## 2019-02-19 DIAGNOSIS — M7989 Other specified soft tissue disorders: Secondary | ICD-10-CM | POA: Insufficient documentation

## 2019-02-19 DIAGNOSIS — G629 Polyneuropathy, unspecified: Secondary | ICD-10-CM | POA: Insufficient documentation

## 2019-02-19 DIAGNOSIS — D472 Monoclonal gammopathy: Secondary | ICD-10-CM

## 2019-02-19 DIAGNOSIS — C9000 Multiple myeloma not having achieved remission: Secondary | ICD-10-CM

## 2019-02-19 DIAGNOSIS — E1140 Type 2 diabetes mellitus with diabetic neuropathy, unspecified: Secondary | ICD-10-CM

## 2019-02-19 DIAGNOSIS — Z794 Long term (current) use of insulin: Secondary | ICD-10-CM

## 2019-02-19 DIAGNOSIS — Z79899 Other long term (current) drug therapy: Secondary | ICD-10-CM | POA: Insufficient documentation

## 2019-02-19 DIAGNOSIS — R5383 Other fatigue: Secondary | ICD-10-CM | POA: Insufficient documentation

## 2019-02-19 DIAGNOSIS — R12 Heartburn: Secondary | ICD-10-CM

## 2019-02-19 DIAGNOSIS — D500 Iron deficiency anemia secondary to blood loss (chronic): Secondary | ICD-10-CM

## 2019-02-19 DIAGNOSIS — K5900 Constipation, unspecified: Secondary | ICD-10-CM | POA: Insufficient documentation

## 2019-02-19 DIAGNOSIS — E1142 Type 2 diabetes mellitus with diabetic polyneuropathy: Secondary | ICD-10-CM

## 2019-02-19 LAB — CMP (CANCER CENTER ONLY)
ALT: 23 U/L (ref 0–44)
AST: 24 U/L (ref 15–41)
Albumin: 4.3 g/dL (ref 3.5–5.0)
Alkaline Phosphatase: 70 U/L (ref 38–126)
Anion gap: 9 (ref 5–15)
BUN: 22 mg/dL (ref 8–23)
CO2: 29 mmol/L (ref 22–32)
Calcium: 10 mg/dL (ref 8.9–10.3)
Chloride: 99 mmol/L (ref 98–111)
Creatinine: 0.81 mg/dL (ref 0.44–1.00)
GFR, Est AFR Am: >60 mL/min (ref >60)
GFR, Estimated: >60 mL/min (ref >60)
Glucose, Bld: 304 mg/dL — ABNORMAL HIGH (ref 70–99)
Potassium: 3.3 mmol/L — ABNORMAL LOW (ref 3.5–5.1)
Sodium: 137 mmol/L (ref 135–145)
Total Bilirubin: 0.7 mg/dL (ref 0.3–1.2)
Total Protein: 7.8 g/dL (ref 6.5–8.1)

## 2019-02-19 LAB — CBC WITH DIFFERENTIAL (CANCER CENTER ONLY)
Abs Immature Granulocytes: 0.03 10*3/uL (ref 0.00–0.07)
Basophils Absolute: 0.1 10*3/uL (ref 0.0–0.1)
Basophils Relative: 1 %
Eosinophils Absolute: 0.5 10*3/uL (ref 0.0–0.5)
Eosinophils Relative: 4 %
HCT: 43.1 % (ref 36.0–46.0)
Hemoglobin: 14.2 g/dL (ref 12.0–15.0)
Immature Granulocytes: 0 %
Lymphocytes Relative: 41 %
Lymphs Abs: 4.6 10*3/uL — ABNORMAL HIGH (ref 0.7–4.0)
MCH: 28.4 pg (ref 26.0–34.0)
MCHC: 32.9 g/dL (ref 30.0–36.0)
MCV: 86.2 fL (ref 80.0–100.0)
Monocytes Absolute: 0.9 10*3/uL (ref 0.1–1.0)
Monocytes Relative: 8 %
Neutro Abs: 5.2 10*3/uL (ref 1.7–7.7)
Neutrophils Relative %: 46 %
Platelet Count: 381 10*3/uL (ref 150–400)
RBC: 5 MIL/uL (ref 3.87–5.11)
RDW: 15.9 % — ABNORMAL HIGH (ref 11.5–15.5)
WBC Count: 11.3 10*3/uL — ABNORMAL HIGH (ref 4.0–10.5)
nRBC: 0 % (ref 0.0–0.2)

## 2019-02-19 NOTE — Progress Notes (Signed)
Hematology and Oncology Follow Up Visit  [name] [id number] 1951/03/21 68 y.o. 02/19/2019   Principle Diagnosis:  1. IgG Kappa smoldering myeloma 2. Chronic neuropathy  Current Therapy:   Velcade q week dosing (3/1) -- s/p cycle #2 -- d/c on 02/06/2018 for lack of effectiveness  Interim History:  [name] is here today for a follow-up.  She is doing okay.  She does feel tired.  This might be from her blood sugar.  Her blood sugar is 304 today.  She will have to get back to her family doctor to try to get this better controlled.  She has had no problems with cough.  She has had no rashes..  She still has a neuropathy in her feet.  She has a back brace on.  She underwent spinal fusion back in December.  This really did not help the neuropathy.  We last saw her, her myeloma studies showed an M spike of 1.2 g/dL.  Her IgG level was 1725 mg/dL.  Her kappa light chain was 4 mg/dL.  Overall, her performance status is ECOG 1.   Medications:  Allergies as of 02/19/2019   No Known Allergies     Medication List       Accurate as of February 19, 2019 12:53 PM. If you have any questions, ask your nurse or doctor.        cyclobenzaprine 5 MG tablet Commonly known as:  FLEXERIL Take 5 mg by mouth 3 (three) times daily as needed.   Fusion Plus Caps Take 1 capsule by mouth daily. What changed:  Another medication with the same name was removed. Continue taking this medication, and follow the directions you see here. Changed by:  [name], MD   HYDROcodone-acetaminophen 10-325 MG tablet Commonly known as:  NORCO TAKE 1 TABLET BY MOUTH EVERY SIX HOURS AS NEEDED DOSE CHANGE   meclizine 25 MG tablet Commonly known as:  ANTIVERT Take 1 tablet (25 mg total) by mouth 3 (three) times daily as needed for dizziness.   metoprolol succinate 100 MG 24 hr tablet Commonly known as:  TOPROL-XL Take 100 mg by mouth daily.   morphine 60 MG 12 hr tablet Commonly known as:  MS  CONTIN Take 60 mg by mouth every 12 (twelve) hours.   pravastatin 40 MG tablet Commonly known as:  PRAVACHOL Take 40 mg by mouth daily.   valsartan-hydrochlorothiazide 160-12.5 MG tablet Commonly known as:  DIOVAN-HCT Take 1 tablet by mouth daily.   venlafaxine XR 150 MG 24 hr capsule Commonly known as:  EFFEXOR-XR Take 150 mg by mouth daily with breakfast.       Allergies: No Known Allergies  Past Medical History, Surgical history, Social history, and Family History were reviewed and updated.  Review of Systems: Review of Systems  Constitutional: Positive for malaise/fatigue.  HENT: Negative.   Eyes: Negative.   Respiratory: Positive for shortness of breath.   Cardiovascular: Positive for palpitations and leg swelling.  Gastrointestinal: Positive for constipation and heartburn.  Genitourinary: Negative.   Musculoskeletal: Positive for joint pain and myalgias.  Skin: Negative.   Neurological: Positive for tingling.  Endo/Heme/Allergies: Negative.   Psychiatric/Behavioral: Negative.      Physical Exam:  weight is 209 lb 8 oz (95 kg). Her oral temperature is 98.6 F (37 C). Her blood pressure is 140/58 (abnormal) and her pulse is 94. Her respiration is 18 and oxygen saturation is 99%.   Wt Readings from Last 3 Encounters:  02/19/19 209  lb 8 oz (95 kg)  01/19/19 206 lb (93.4 kg)  11/09/18 204 lb (92.5 kg)  Is 1 weight 1 1% patient is no  Physical Exam Vitals signs reviewed.  HENT:     Head: Normocephalic and atraumatic.  Eyes:     Pupils: Pupils are equal, round, and reactive to light.  Neck:     Musculoskeletal: Normal range of motion.  Cardiovascular:     Rate and Rhythm: Normal rate and regular rhythm.     Heart sounds: Normal heart sounds.  Pulmonary:     Effort: Pulmonary effort is normal.     Breath sounds: Normal breath sounds.  Abdominal:     General: Bowel sounds are normal.     Palpations: Abdomen is soft.  Musculoskeletal: Normal range of  motion.        General: No tenderness or deformity.  Lymphadenopathy:     Cervical: No cervical adenopathy.  Skin:    General: Skin is warm and dry.     Findings: No erythema or rash.  Neurological:     Mental Status: She is alert and oriented to person, place, and time.  Psychiatric:        Behavior: Behavior normal.        Thought Content: Thought content normal.        Judgment: Judgment normal.     Lab Results  Component Value Date   WBC 11.3 (H) 02/19/2019   HGB 14.2 02/19/2019   HCT 43.1 02/19/2019   MCV 86.2 02/19/2019   PLT 381 02/19/2019   Lab Results  Component Value Date   FERRITIN 79 06/06/2012   IRON 79 09/02/2011   TIBC 421 09/02/2011   UIBC 342 09/02/2011   IRONPCTSAT 19 (L) 09/02/2011   Lab Results  Component Value Date   RBC 5.00 02/19/2019   Lab Results  Component Value Date   KPAFRELGTCHN 39.9 (H) 11/09/2018   LAMBDASER 25.1 11/09/2018   KAPLAMBRATIO 1.59 11/09/2018   Lab Results  Component Value Date   IGGSERUM 2,056 (H) 11/09/2018   IGA 94 11/09/2018   IGMSERUM 68 11/09/2018   Lab Results  Component Value Date   TOTALPROTELP 7.8 11/09/2018   ALBUMINELP 3.4 11/09/2018   A1GS 0.2 11/09/2018   A2GS 1.0 11/09/2018   BETS 1.2 11/09/2018   BETA2SER 0.3 02/20/2015   GAMS 1.9 (H) 11/09/2018   MSPIKE 1.2 (H) 11/09/2018   SPEI * 02/20/2015     Chemistry      Component Value Date/Time   NA 137 02/19/2019 1207   NA 141 01/24/2018 1213   NA 140 03/27/2017 0804   K 3.3 (L) 02/19/2019 1207   K 3.7 03/27/2017 0804   CL 99 02/19/2019 1207   CL 94 (L) 02/20/2015 1010   CO2 29 02/19/2019 1207   CO2 28 03/27/2017 0804   BUN 22 02/19/2019 1207   BUN 15 01/24/2018 1213   BUN 14.9 03/27/2017 0804   CREATININE 0.81 02/19/2019 1207   CREATININE 0.9 03/27/2017 0804      Component Value Date/Time   CALCIUM 10.0 02/19/2019 1207   CALCIUM 10.0 03/27/2017 0804   ALKPHOS 70 02/19/2019 1207   ALKPHOS 69 03/27/2017 0804   AST 24 02/19/2019 1207    AST 26 03/27/2017 0804   ALT 23 02/19/2019 1207   ALT 28 03/27/2017 0804   BILITOT 0.7 02/19/2019 1207   BILITOT 0.76 03/27/2017 0804     Impression and Plan: [name] is 68 yo white female with  smoldering myeloma and also chronic neuropathy due to diabetes and chronic back issues.   We will have to see what her myeloma levels look like.  Again, we tried treating her and she just would not respond.  We had her on Velcade.  I would be very hesitant to treat her again unless we start to see her myeloma levels go up quickly.  I will plan to see her back in 2 months.  Hopefully, her blood sugars will be better.  [name], MD 6/9/202012:53 PM

## 2019-02-20 LAB — IRON AND TIBC
Iron: 151 ug/dL — ABNORMAL HIGH (ref 41–142)
Saturation Ratios: 39 % (ref 21–57)
TIBC: 385 ug/dL (ref 236–444)
UIBC: 234 ug/dL (ref 120–384)

## 2019-02-20 LAB — FERRITIN: Ferritin: 65 ng/mL (ref 11–307)

## 2019-03-08 ENCOUNTER — Encounter: Payer: Self-pay | Admitting: General Practice

## 2019-03-08 NOTE — Progress Notes (Signed)
[facility] Note  Met with [name] by phone for one-hour appt as planned. She conducted life review, sharing about key events, people, medical issues, and losses in her history. Provided empathic listening, normalizing feelings. Also used a strengths-based listening model to encourage [name] to identify aspects of learning and growth in her story.  Identified differences and commonalities between chaplaincy and counseling. Per pt, she "felt a lot better" after getting to share about her experience and feelings. Per her request we plan to f/u by phone next week with counseling referral resources.   [place], [place], [facility] Pager [phone] Voicemail [phone]

## 2019-03-11 ENCOUNTER — Encounter: Payer: Self-pay | Admitting: General Practice

## 2019-03-11 NOTE — Progress Notes (Signed)
[facility] Note  Emailed Tayanna a list of counseling referral resources per her request.   Pauline Good, [facility] Pager [phone] Voicemail [phone]

## 2019-03-25 DIAGNOSIS — E1142 Type 2 diabetes mellitus with diabetic polyneuropathy: Secondary | ICD-10-CM | POA: Diagnosis not present

## 2019-03-25 DIAGNOSIS — G894 Chronic pain syndrome: Secondary | ICD-10-CM | POA: Diagnosis not present

## 2019-03-25 DIAGNOSIS — M961 Postlaminectomy syndrome, not elsewhere classified: Secondary | ICD-10-CM | POA: Diagnosis not present

## 2019-03-25 DIAGNOSIS — Z79891 Long term (current) use of opiate analgesic: Secondary | ICD-10-CM | POA: Diagnosis not present

## 2019-04-23 ENCOUNTER — Other Ambulatory Visit: Payer: BC Managed Care – PPO

## 2019-04-23 ENCOUNTER — Ambulatory Visit: Payer: BC Managed Care – PPO | Admitting: Hematology & Oncology

## 2019-05-10 ENCOUNTER — Encounter: Payer: Self-pay | Admitting: Hematology & Oncology

## 2019-05-10 ENCOUNTER — Inpatient Hospital Stay (HOSPITAL_BASED_OUTPATIENT_CLINIC_OR_DEPARTMENT_OTHER): Payer: BC Managed Care – PPO | Admitting: Hematology & Oncology

## 2019-05-10 ENCOUNTER — Other Ambulatory Visit: Payer: Self-pay

## 2019-05-10 ENCOUNTER — Inpatient Hospital Stay: Payer: BC Managed Care – PPO | Attending: Hematology & Oncology

## 2019-05-10 VITALS — BP 127/70 | HR 81 | Temp 96.9°F | Resp 18 | Wt 208.0 lb

## 2019-05-10 DIAGNOSIS — D472 Monoclonal gammopathy: Secondary | ICD-10-CM

## 2019-05-10 DIAGNOSIS — Z79899 Other long term (current) drug therapy: Secondary | ICD-10-CM | POA: Insufficient documentation

## 2019-05-10 DIAGNOSIS — K5900 Constipation, unspecified: Secondary | ICD-10-CM | POA: Insufficient documentation

## 2019-05-10 DIAGNOSIS — G6289 Other specified polyneuropathies: Secondary | ICD-10-CM | POA: Diagnosis not present

## 2019-05-10 DIAGNOSIS — M7989 Other specified soft tissue disorders: Secondary | ICD-10-CM | POA: Diagnosis not present

## 2019-05-10 DIAGNOSIS — E118 Type 2 diabetes mellitus with unspecified complications: Secondary | ICD-10-CM | POA: Diagnosis not present

## 2019-05-10 DIAGNOSIS — R12 Heartburn: Secondary | ICD-10-CM | POA: Diagnosis not present

## 2019-05-10 DIAGNOSIS — R5383 Other fatigue: Secondary | ICD-10-CM | POA: Insufficient documentation

## 2019-05-10 DIAGNOSIS — M791 Myalgia, unspecified site: Secondary | ICD-10-CM | POA: Diagnosis not present

## 2019-05-10 DIAGNOSIS — C9000 Multiple myeloma not having achieved remission: Secondary | ICD-10-CM | POA: Diagnosis not present

## 2019-05-10 DIAGNOSIS — M2550 Pain in unspecified joint: Secondary | ICD-10-CM | POA: Insufficient documentation

## 2019-05-10 DIAGNOSIS — R0602 Shortness of breath: Secondary | ICD-10-CM | POA: Insufficient documentation

## 2019-05-10 DIAGNOSIS — R002 Palpitations: Secondary | ICD-10-CM | POA: Diagnosis not present

## 2019-05-10 LAB — CBC WITH DIFFERENTIAL (CANCER CENTER ONLY)
Abs Immature Granulocytes: 0.03 10*3/uL (ref 0.00–0.07)
Basophils Absolute: 0.1 10*3/uL (ref 0.0–0.1)
Basophils Relative: 1 %
Eosinophils Absolute: 0.6 10*3/uL — ABNORMAL HIGH (ref 0.0–0.5)
Eosinophils Relative: 5 %
HCT: 41 % (ref 36.0–46.0)
Hemoglobin: 13.6 g/dL (ref 12.0–15.0)
Immature Granulocytes: 0 %
Lymphocytes Relative: 45 %
Lymphs Abs: 5.1 10*3/uL — ABNORMAL HIGH (ref 0.7–4.0)
MCH: 30 pg (ref 26.0–34.0)
MCHC: 33.2 g/dL (ref 30.0–36.0)
MCV: 90.3 fL (ref 80.0–100.0)
Monocytes Absolute: 0.8 10*3/uL (ref 0.1–1.0)
Monocytes Relative: 7 %
Neutro Abs: 4.9 10*3/uL (ref 1.7–7.7)
Neutrophils Relative %: 42 %
Platelet Count: 319 10*3/uL (ref 150–400)
RBC: 4.54 MIL/uL (ref 3.87–5.11)
RDW: 12 % (ref 11.5–15.5)
WBC Count: 11.5 10*3/uL — ABNORMAL HIGH (ref 4.0–10.5)
nRBC: 0 % (ref 0.0–0.2)

## 2019-05-10 LAB — CMP (CANCER CENTER ONLY)
ALT: 17 U/L (ref 0–44)
AST: 22 U/L (ref 15–41)
Albumin: 4.1 g/dL (ref 3.5–5.0)
Alkaline Phosphatase: 63 U/L (ref 38–126)
Anion gap: 8 (ref 5–15)
BUN: 16 mg/dL (ref 8–23)
CO2: 29 mmol/L (ref 22–32)
Calcium: 9.6 mg/dL (ref 8.9–10.3)
Chloride: 101 mmol/L (ref 98–111)
Creatinine: 0.88 mg/dL (ref 0.44–1.00)
GFR, Est AFR Am: >60 mL/min (ref >60)
GFR, Estimated: >60 mL/min (ref >60)
Glucose, Bld: 218 mg/dL — ABNORMAL HIGH (ref 70–99)
Potassium: 3.6 mmol/L (ref 3.5–5.1)
Sodium: 138 mmol/L (ref 135–145)
Total Bilirubin: 0.6 mg/dL (ref 0.3–1.2)
Total Protein: 7.5 g/dL (ref 6.5–8.1)

## 2019-05-10 NOTE — Progress Notes (Signed)
Hematology and Oncology Follow Up Visit  [name] [id number] [birth year] 68 y.o. 05/10/2019   Principle Diagnosis:  1. IgG Kappa smoldering myeloma 2. Chronic neuropathy  Current Therapy:   Velcade q week dosing (3/1) -- s/p cycle #2 -- d/c on 02/06/2018 for lack of effectiveness  Interim History:  [name] is here today for a follow-up.  So far, she is doing fairly well.  She does not like the hot, humid summer weather.  Her back seems to be doing a little bit better.  She had her back surgery back in December.  Her blood sugars still have been on the high side.  We last checked her myeloma studies, her M spike was 1.2 g/dL.  We will have to watch this.  She has had no change in bowel or bladder habits.  She has had no cough.  She has had no rashes.    Overall, her performance status is ECOG 1.   Medications:  Allergies as of 05/10/2019   No Known Allergies     Medication List       Accurate as of May 10, 2019  4:16 PM. If you have any questions, ask your nurse or doctor.        cyclobenzaprine 5 MG tablet Commonly known as: FLEXERIL Take 5 mg by mouth 3 (three) times daily as needed.   Fusion Plus Caps Take 1 capsule by mouth daily.   HYDROcodone-acetaminophen 10-325 MG tablet Commonly known as: NORCO TAKE 1 TABLET BY MOUTH EVERY SIX HOURS AS NEEDED DOSE CHANGE   meclizine 25 MG tablet Commonly known as: ANTIVERT Take 1 tablet (25 mg total) by mouth 3 (three) times daily as needed for dizziness.   metFORMIN 500 MG 24 hr tablet Commonly known as: GLUCOPHAGE-XR Take 500 mg by mouth 2 (two) times daily.   metoprolol succinate 100 MG 24 hr tablet Commonly known as: TOPROL-XL Take 100 mg by mouth daily.   morphine 60 MG 12 hr tablet Commonly known as: MS CONTIN Take 60 mg by mouth every 12 (twelve) hours.   pravastatin 40 MG tablet Commonly known as: PRAVACHOL Take 40 mg by mouth daily.   valsartan-hydrochlorothiazide 160-12.5 MG  tablet Commonly known as: DIOVAN-HCT Take 1 tablet by mouth daily.   venlafaxine XR 150 MG 24 hr capsule Commonly known as: EFFEXOR-XR Take 150 mg by mouth daily with breakfast.       Allergies: No Known Allergies  Past Medical History, Surgical history, Social history, and Family History were reviewed and updated.  Review of Systems: Review of Systems  Constitutional: Positive for malaise/fatigue.  HENT: Negative.   Eyes: Negative.   Respiratory: Positive for shortness of breath.   Cardiovascular: Positive for palpitations and leg swelling.  Gastrointestinal: Positive for constipation and heartburn.  Genitourinary: Negative.   Musculoskeletal: Positive for joint pain and myalgias.  Skin: Negative.   Neurological: Positive for tingling.  Endo/Heme/Allergies: Negative.   Psychiatric/Behavioral: Negative.      Physical Exam:  weight is 208 lb (94.3 kg). Her oral temperature is 96.9 F (36.1 C) (abnormal). Her blood pressure is 127/70 and her pulse is 81. Her respiration is 18 and oxygen saturation is 92%.   Wt Readings from Last 3 Encounters:  05/10/19 208 lb (94.3 kg)  02/19/19 209 lb 8 oz (95 kg)  01/19/19 206 lb (93.4 kg)  Is 1 weight 1 1% patient is no  Physical Exam Vitals signs reviewed.  HENT:     Head: Normocephalic and atraumatic.  Eyes:     Pupils: Pupils are equal, round, and reactive to light.  Neck:     Musculoskeletal: Normal range of motion.  Cardiovascular:     Rate and Rhythm: Normal rate and regular rhythm.     Heart sounds: Normal heart sounds.  Pulmonary:     Effort: Pulmonary effort is normal.     Breath sounds: Normal breath sounds.  Abdominal:     General: Bowel sounds are normal.     Palpations: Abdomen is soft.  Musculoskeletal: Normal range of motion.        General: No tenderness or deformity.  Lymphadenopathy:     Cervical: No cervical adenopathy.  Skin:    General: Skin is warm and dry.     Findings: No erythema or rash.   Neurological:     Mental Status: She is alert and oriented to person, place, and time.  Psychiatric:        Behavior: Behavior normal.        Thought Content: Thought content normal.        Judgment: Judgment normal.     Lab Results  Component Value Date   WBC 11.5 (H) 05/10/2019   HGB 13.6 05/10/2019   HCT 41.0 05/10/2019   MCV 90.3 05/10/2019   PLT 319 05/10/2019   Lab Results  Component Value Date   FERRITIN 65 02/19/2019   IRON 151 (H) 02/19/2019   TIBC 385 02/19/2019   UIBC 234 02/19/2019   IRONPCTSAT 39 02/19/2019   Lab Results  Component Value Date   RBC 4.54 05/10/2019   Lab Results  Component Value Date   KPAFRELGTCHN 39.9 (H) 11/09/2018   LAMBDASER 25.1 11/09/2018   KAPLAMBRATIO 1.59 11/09/2018   Lab Results  Component Value Date   IGGSERUM 2,056 (H) 11/09/2018   IGA 94 11/09/2018   IGMSERUM 68 11/09/2018   Lab Results  Component Value Date   TOTALPROTELP 7.8 11/09/2018   ALBUMINELP 3.4 11/09/2018   A1GS 0.2 11/09/2018   A2GS 1.0 11/09/2018   BETS 1.2 11/09/2018   BETA2SER 0.3 02/20/2015   GAMS 1.9 (H) 11/09/2018   MSPIKE 1.2 (H) 11/09/2018   SPEI * 02/20/2015     Chemistry      Component Value Date/Time   NA 138 05/10/2019 1527   NA 141 01/24/2018 1213   NA 140 03/27/2017 0804   K 3.6 05/10/2019 1527   K 3.7 03/27/2017 0804   CL 101 05/10/2019 1527   CL 94 (L) 02/20/2015 1010   CO2 29 05/10/2019 1527   CO2 28 03/27/2017 0804   BUN 16 05/10/2019 1527   BUN 15 01/24/2018 1213   BUN 14.9 03/27/2017 0804   CREATININE 0.88 05/10/2019 1527   CREATININE 0.9 03/27/2017 0804      Component Value Date/Time   CALCIUM 9.6 05/10/2019 1527   CALCIUM 10.0 03/27/2017 0804   ALKPHOS 63 05/10/2019 1527   ALKPHOS 69 03/27/2017 0804   AST 22 05/10/2019 1527   AST 26 03/27/2017 0804   ALT 17 05/10/2019 1527   ALT 28 03/27/2017 0804   BILITOT 0.6 05/10/2019 1527   BILITOT 0.76 03/27/2017 0804     Impression and Plan: [name] is 68 yo  white female with smoldering myeloma and also chronic neuropathy due to diabetes and chronic back issues.   We will have to see what her myeloma levels look like.  Again, we tried treating her and she just would not respond.  We had her on Velcade.  I would be very hesitant to treat her again unless we start to see her myeloma levels go up quickly.  I will plan to see her back in 2 months.  Hopefully, her blood sugars will be better.  [name], MD 8/28/20204:16 PM

## 2019-05-11 LAB — IGG, IGA, IGM
IgA: 74 mg/dL — ABNORMAL LOW (ref 87–352)
IgG (Immunoglobin G), Serum: 1670 mg/dL — ABNORMAL HIGH (ref 586–1602)
IgM (Immunoglobulin M), Srm: 51 mg/dL (ref 26–217)

## 2019-05-13 ENCOUNTER — Telehealth: Payer: Self-pay | Admitting: Hematology & Oncology

## 2019-05-13 LAB — KAPPA/LAMBDA LIGHT CHAINS
Kappa free light chain: 37 mg/L — ABNORMAL HIGH (ref 3.3–19.4)
Kappa, lambda light chain ratio: 1.36 (ref 0.26–1.65)
Lambda free light chains: 27.3 mg/L — ABNORMAL HIGH (ref 5.7–26.3)

## 2019-05-13 NOTE — Telephone Encounter (Signed)
lmom for Nov appts per 8/28 LOS

## 2019-05-15 LAB — IMMUNOFIXATION REFLEX, SERUM
IgA: 79 mg/dL — ABNORMAL LOW (ref 87–352)
IgG (Immunoglobin G), Serum: 1894 mg/dL — ABNORMAL HIGH (ref 586–1602)
IgM (Immunoglobulin M), Srm: 54 mg/dL (ref 26–217)

## 2019-05-15 LAB — PROTEIN ELECTROPHORESIS, SERUM, WITH REFLEX
A/G Ratio: 1 (ref 0.7–1.7)
Albumin ELP: 3.5 g/dL (ref 2.9–4.4)
Alpha-1-Globulin: 0.2 g/dL (ref 0.0–0.4)
Alpha-2-Globulin: 0.9 g/dL (ref 0.4–1.0)
Beta Globulin: 0.9 g/dL (ref 0.7–1.3)
Gamma Globulin: 1.4 g/dL (ref 0.4–1.8)
Globulin, Total: 3.4 g/dL (ref 2.2–3.9)
M-Spike, %: 1 g/dL — ABNORMAL HIGH
SPEP Interpretation: 0
Total Protein ELP: 6.9 g/dL (ref 6.0–8.5)

## 2019-05-21 ENCOUNTER — Telehealth: Payer: Self-pay | Admitting: *Deleted

## 2019-05-21 NOTE — Telephone Encounter (Signed)
As noted below by Dr. Marin Olp, I left a message on her cell phone to call Dr. Antonieta Pert office for lab results.

## 2019-05-21 NOTE — Telephone Encounter (Signed)
-----   Message from [name], MD sent at 05/19/2019  8:32 PM EDT ----- Call - the myeloma is very stable!!  No need for treatment!! Laurey Arrow

## 2019-05-24 DIAGNOSIS — B351 Tinea unguium: Secondary | ICD-10-CM | POA: Diagnosis not present

## 2019-05-24 DIAGNOSIS — E1151 Type 2 diabetes mellitus with diabetic peripheral angiopathy without gangrene: Secondary | ICD-10-CM | POA: Diagnosis not present

## 2019-06-28 DIAGNOSIS — E119 Type 2 diabetes mellitus without complications: Secondary | ICD-10-CM | POA: Diagnosis not present

## 2019-06-28 DIAGNOSIS — H25013 Cortical age-related cataract, bilateral: Secondary | ICD-10-CM | POA: Diagnosis not present

## 2019-06-28 DIAGNOSIS — H35013 Changes in retinal vascular appearance, bilateral: Secondary | ICD-10-CM | POA: Diagnosis not present

## 2019-06-28 DIAGNOSIS — H353131 Nonexudative age-related macular degeneration, bilateral, early dry stage: Secondary | ICD-10-CM | POA: Diagnosis not present

## 2019-07-10 DIAGNOSIS — H8120 Vestibular neuronitis, unspecified ear: Secondary | ICD-10-CM | POA: Diagnosis not present

## 2019-07-10 DIAGNOSIS — R490 Dysphonia: Secondary | ICD-10-CM | POA: Diagnosis not present

## 2019-07-10 DIAGNOSIS — C9000 Multiple myeloma not having achieved remission: Secondary | ICD-10-CM | POA: Diagnosis not present

## 2019-07-10 DIAGNOSIS — H903 Sensorineural hearing loss, bilateral: Secondary | ICD-10-CM | POA: Diagnosis not present

## 2019-07-26 ENCOUNTER — Telehealth: Payer: Self-pay | Admitting: Hematology & Oncology

## 2019-07-26 DIAGNOSIS — G43109 Migraine with aura, not intractable, without status migrainosus: Secondary | ICD-10-CM | POA: Diagnosis not present

## 2019-07-26 DIAGNOSIS — M316 Other giant cell arteritis: Secondary | ICD-10-CM | POA: Diagnosis not present

## 2019-07-26 DIAGNOSIS — H903 Sensorineural hearing loss, bilateral: Secondary | ICD-10-CM | POA: Diagnosis not present

## 2019-07-26 DIAGNOSIS — R42 Dizziness and giddiness: Secondary | ICD-10-CM | POA: Diagnosis not present

## 2019-07-26 NOTE — Telephone Encounter (Signed)
lmom to inform patient of r/s 11/16 appt to 12/9 at 2 pm due to MD on Call

## 2019-07-29 ENCOUNTER — Inpatient Hospital Stay: Payer: BC Managed Care – PPO

## 2019-07-29 ENCOUNTER — Inpatient Hospital Stay: Payer: BC Managed Care – PPO | Admitting: Hematology & Oncology

## 2019-07-31 ENCOUNTER — Ambulatory Visit: Payer: BC Managed Care – PPO | Admitting: Orthopaedic Surgery

## 2019-08-21 ENCOUNTER — Inpatient Hospital Stay: Payer: BC Managed Care – PPO

## 2019-08-21 ENCOUNTER — Inpatient Hospital Stay: Payer: BC Managed Care – PPO | Admitting: Hematology & Oncology

## 2019-08-27 ENCOUNTER — Other Ambulatory Visit: Payer: Self-pay | Admitting: Otolaryngology

## 2019-08-27 DIAGNOSIS — R42 Dizziness and giddiness: Secondary | ICD-10-CM

## 2019-08-29 DIAGNOSIS — G4700 Insomnia, unspecified: Secondary | ICD-10-CM | POA: Diagnosis not present

## 2019-08-29 DIAGNOSIS — E782 Mixed hyperlipidemia: Secondary | ICD-10-CM | POA: Diagnosis not present

## 2019-08-29 DIAGNOSIS — E1143 Type 2 diabetes mellitus with diabetic autonomic (poly)neuropathy: Secondary | ICD-10-CM | POA: Diagnosis not present

## 2019-08-29 DIAGNOSIS — Z23 Encounter for immunization: Secondary | ICD-10-CM | POA: Diagnosis not present

## 2019-09-11 ENCOUNTER — Ambulatory Visit: Payer: BC Managed Care – PPO | Admitting: Hematology & Oncology

## 2019-09-11 ENCOUNTER — Other Ambulatory Visit: Payer: BC Managed Care – PPO

## 2019-09-21 ENCOUNTER — Other Ambulatory Visit: Payer: BC Managed Care – PPO

## 2019-10-03 ENCOUNTER — Inpatient Hospital Stay (HOSPITAL_BASED_OUTPATIENT_CLINIC_OR_DEPARTMENT_OTHER): Payer: BC Managed Care – PPO | Admitting: Hematology & Oncology

## 2019-10-03 ENCOUNTER — Other Ambulatory Visit: Payer: Self-pay

## 2019-10-03 ENCOUNTER — Encounter: Payer: Self-pay | Admitting: Hematology & Oncology

## 2019-10-03 ENCOUNTER — Inpatient Hospital Stay: Payer: BC Managed Care – PPO | Attending: Hematology & Oncology

## 2019-10-03 VITALS — BP 148/71 | HR 90 | Temp 97.8°F | Resp 18 | Wt 209.0 lb

## 2019-10-03 DIAGNOSIS — R12 Heartburn: Secondary | ICD-10-CM | POA: Insufficient documentation

## 2019-10-03 DIAGNOSIS — R002 Palpitations: Secondary | ICD-10-CM | POA: Diagnosis not present

## 2019-10-03 DIAGNOSIS — M2550 Pain in unspecified joint: Secondary | ICD-10-CM | POA: Diagnosis not present

## 2019-10-03 DIAGNOSIS — M7989 Other specified soft tissue disorders: Secondary | ICD-10-CM | POA: Insufficient documentation

## 2019-10-03 DIAGNOSIS — R5383 Other fatigue: Secondary | ICD-10-CM | POA: Diagnosis not present

## 2019-10-03 DIAGNOSIS — E1140 Type 2 diabetes mellitus with diabetic neuropathy, unspecified: Secondary | ICD-10-CM | POA: Diagnosis not present

## 2019-10-03 DIAGNOSIS — Z79899 Other long term (current) drug therapy: Secondary | ICD-10-CM | POA: Insufficient documentation

## 2019-10-03 DIAGNOSIS — D472 Monoclonal gammopathy: Secondary | ICD-10-CM

## 2019-10-03 DIAGNOSIS — C9000 Multiple myeloma not having achieved remission: Secondary | ICD-10-CM | POA: Diagnosis not present

## 2019-10-03 DIAGNOSIS — G6289 Other specified polyneuropathies: Secondary | ICD-10-CM | POA: Diagnosis not present

## 2019-10-03 DIAGNOSIS — K5900 Constipation, unspecified: Secondary | ICD-10-CM | POA: Insufficient documentation

## 2019-10-03 DIAGNOSIS — R0602 Shortness of breath: Secondary | ICD-10-CM | POA: Insufficient documentation

## 2019-10-03 DIAGNOSIS — R42 Dizziness and giddiness: Secondary | ICD-10-CM | POA: Insufficient documentation

## 2019-10-03 DIAGNOSIS — Z7984 Long term (current) use of oral hypoglycemic drugs: Secondary | ICD-10-CM | POA: Insufficient documentation

## 2019-10-03 DIAGNOSIS — M791 Myalgia, unspecified site: Secondary | ICD-10-CM | POA: Diagnosis not present

## 2019-10-03 LAB — CMP (CANCER CENTER ONLY)
ALT: 18 U/L (ref 0–44)
AST: 20 U/L (ref 15–41)
Albumin: 4.5 g/dL (ref 3.5–5.0)
Alkaline Phosphatase: 57 U/L (ref 38–126)
Anion gap: 7 (ref 5–15)
BUN: 20 mg/dL (ref 8–23)
CO2: 32 mmol/L (ref 22–32)
Calcium: 10 mg/dL (ref 8.9–10.3)
Chloride: 100 mmol/L (ref 98–111)
Creatinine: 0.82 mg/dL (ref 0.44–1.00)
GFR, Est AFR Am: >60 mL/min (ref >60)
GFR, Estimated: >60 mL/min (ref >60)
Glucose, Bld: 146 mg/dL — ABNORMAL HIGH (ref 70–99)
Potassium: 3.5 mmol/L (ref 3.5–5.1)
Sodium: 139 mmol/L (ref 135–145)
Total Bilirubin: 0.5 mg/dL (ref 0.3–1.2)
Total Protein: 8.1 g/dL (ref 6.5–8.1)

## 2019-10-03 LAB — CBC WITH DIFFERENTIAL (CANCER CENTER ONLY)
Abs Immature Granulocytes: 0.04 10*3/uL (ref 0.00–0.07)
Basophils Absolute: 0.1 10*3/uL (ref 0.0–0.1)
Basophils Relative: 1 %
Eosinophils Absolute: 0.4 10*3/uL (ref 0.0–0.5)
Eosinophils Relative: 4 %
HCT: 41.5 % (ref 36.0–46.0)
Hemoglobin: 13.4 g/dL (ref 12.0–15.0)
Immature Granulocytes: 0 %
Lymphocytes Relative: 42 %
Lymphs Abs: 4.3 10*3/uL — ABNORMAL HIGH (ref 0.7–4.0)
MCH: 29.1 pg (ref 26.0–34.0)
MCHC: 32.3 g/dL (ref 30.0–36.0)
MCV: 90 fL (ref 80.0–100.0)
Monocytes Absolute: 0.9 10*3/uL (ref 0.1–1.0)
Monocytes Relative: 8 %
Neutro Abs: 4.6 10*3/uL (ref 1.7–7.7)
Neutrophils Relative %: 45 %
Platelet Count: 355 10*3/uL (ref 150–400)
RBC: 4.61 MIL/uL (ref 3.87–5.11)
RDW: 12.2 % (ref 11.5–15.5)
WBC Count: 10.3 10*3/uL (ref 4.0–10.5)
nRBC: 0 % (ref 0.0–0.2)

## 2019-10-03 NOTE — Progress Notes (Signed)
Hematology and Oncology Follow Up Visit  [name] [id number] 1951/02/28 69 y.o. 10/03/2019   Principle Diagnosis:  1. IgG Kappa smoldering myeloma 2. Chronic neuropathy  Current Therapy:   Velcade q week dosing (3/1) -- s/p cycle #2 -- d/c on 02/06/2018 for lack of effectiveness  Interim History:  [name] is here today for a follow-up.  Overall, she really is about the same.  She has neuropathy.  She now has vertigo.  She has not really sleeping all that well.  She is tired all the time.  I just wish there is something that we could find that we could help treat to make her feel better.  We last saw her back in August, her M spike was 1 g/dL.  Her IgG level was 1700 mg/dL.  Her kappa light chain was 3.7 mg/dL.  All this is relatively stable.  Has now been a year since she has had her back surgery.  She seems to be doing pretty well with this.  Again the neuropathy is present.  I suspect she will always have this.  She does have diabetes.  There is no fever.  She has had no cough.  Thankfully, she is avoided the coronavirus.  Overall, her performance status is ECOG 1.   Medications:  Allergies as of 10/03/2019   No Known Allergies     Medication List       Accurate as of October 03, 2019 12:06 PM. If you have any questions, ask your nurse or doctor.        cyclobenzaprine 5 MG tablet Commonly known as: FLEXERIL Take 5 mg by mouth 3 (three) times daily as needed.   Fusion Plus Caps Take 1 capsule by mouth daily.   HYDROcodone-acetaminophen 10-325 MG tablet Commonly known as: NORCO TAKE 1 TABLET BY MOUTH EVERY SIX HOURS AS NEEDED DOSE CHANGE   meclizine 25 MG tablet Commonly known as: ANTIVERT Take 1 tablet (25 mg total) by mouth 3 (three) times daily as needed for dizziness.   metFORMIN 500 MG 24 hr tablet Commonly known as: GLUCOPHAGE-XR Take 500 mg by mouth 2 (two) times daily.   metoprolol succinate 100 MG 24 hr tablet Commonly known as:  TOPROL-XL Take 100 mg by mouth daily.   morphine 60 MG 12 hr tablet Commonly known as: MS CONTIN Take 60 mg by mouth every 12 (twelve) hours.   pravastatin 40 MG tablet Commonly known as: PRAVACHOL Take 40 mg by mouth daily.   valsartan-hydrochlorothiazide 160-12.5 MG tablet Commonly known as: DIOVAN-HCT Take 1 tablet by mouth daily.   venlafaxine XR 150 MG 24 hr capsule Commonly known as: EFFEXOR-XR Take 150 mg by mouth daily with breakfast.       Allergies: No Known Allergies  Past Medical History, Surgical history, Social history, and Family History were reviewed and updated.  Review of Systems: Review of Systems  Constitutional: Positive for malaise/fatigue.  HENT: Negative.   Eyes: Negative.   Respiratory: Positive for shortness of breath.   Cardiovascular: Positive for palpitations and leg swelling.  Gastrointestinal: Positive for constipation and heartburn.  Genitourinary: Negative.   Musculoskeletal: Positive for joint pain and myalgias.  Skin: Negative.   Neurological: Positive for tingling.  Endo/Heme/Allergies: Negative.   Psychiatric/Behavioral: Negative.      Physical Exam:  weight is 209 lb (94.8 kg). Her temporal temperature is 97.8 F (36.6 C). Her blood pressure is 148/71 (abnormal) and her pulse is 90. Her respiration is 18 and oxygen saturation is  94%.   Wt Readings from Last 3 Encounters:  10/03/19 209 lb (94.8 kg)  05/10/19 208 lb (94.3 kg)  02/19/19 209 lb 8 oz (95 kg)  Is 1 weight 1 1% patient is no  Physical Exam Vitals reviewed.  HENT:     Head: Normocephalic and atraumatic.  Eyes:     Pupils: Pupils are equal, round, and reactive to light.  Cardiovascular:     Rate and Rhythm: Normal rate and regular rhythm.     Heart sounds: Normal heart sounds.  Pulmonary:     Effort: Pulmonary effort is normal.     Breath sounds: Normal breath sounds.  Abdominal:     General: Bowel sounds are normal.     Palpations: Abdomen is soft.   Musculoskeletal:        General: No tenderness or deformity. Normal range of motion.     Cervical back: Normal range of motion.  Lymphadenopathy:     Cervical: No cervical adenopathy.  Skin:    General: Skin is warm and dry.     Findings: No erythema or rash.  Neurological:     Mental Status: She is alert and oriented to person, place, and time.  Psychiatric:        Behavior: Behavior normal.        Thought Content: Thought content normal.        Judgment: Judgment normal.     Lab Results  Component Value Date   WBC 10.3 10/03/2019   HGB 13.4 10/03/2019   HCT 41.5 10/03/2019   MCV 90.0 10/03/2019   PLT 355 10/03/2019   Lab Results  Component Value Date   FERRITIN 65 02/19/2019   IRON 151 (H) 02/19/2019   TIBC 385 02/19/2019   UIBC 234 02/19/2019   IRONPCTSAT 39 02/19/2019   Lab Results  Component Value Date   RBC 4.61 10/03/2019   Lab Results  Component Value Date   KPAFRELGTCHN 37.0 (H) 05/10/2019   LAMBDASER 27.3 (H) 05/10/2019   KAPLAMBRATIO 1.36 05/10/2019   Lab Results  Component Value Date   IGGSERUM 1,894 (H) 05/10/2019   IGA 79 (L) 05/10/2019   IGMSERUM 54 05/10/2019   Lab Results  Component Value Date   TOTALPROTELP 6.9 05/10/2019   ALBUMINELP 3.5 05/10/2019   A1GS 0.2 05/10/2019   A2GS 0.9 05/10/2019   BETS 0.9 05/10/2019   BETA2SER 0.3 02/20/2015   GAMS 1.4 05/10/2019   MSPIKE 1.0 (H) 05/10/2019   SPEI * 02/20/2015     Chemistry      Component Value Date/Time   NA 139 10/03/2019 1118   NA 141 01/24/2018 1213   NA 140 03/27/2017 0804   K 3.5 10/03/2019 1118   K 3.7 03/27/2017 0804   CL 100 10/03/2019 1118   CL 94 (L) 02/20/2015 1010   CO2 32 10/03/2019 1118   CO2 28 03/27/2017 0804   BUN 20 10/03/2019 1118   BUN 15 01/24/2018 1213   BUN 14.9 03/27/2017 0804   CREATININE 0.82 10/03/2019 1118   CREATININE 0.9 03/27/2017 0804      Component Value Date/Time   CALCIUM 10.0 10/03/2019 1118   CALCIUM 10.0 03/27/2017 0804   ALKPHOS  57 10/03/2019 1118   ALKPHOS 69 03/27/2017 0804   AST 20 10/03/2019 1118   AST 26 03/27/2017 0804   ALT 18 10/03/2019 1118   ALT 28 03/27/2017 0804   BILITOT 0.5 10/03/2019 1118   BILITOT 0.76 03/27/2017 0804     Impression and  Plan: [name] is 69 yo white female with smoldering myeloma and also chronic neuropathy due to diabetes and chronic back issues.   From my point of view, everything is still holding stable with respect to her blood.  I know she has a lot of other issues.  Hopefully, her family doctor will be able to help her out.  We will plan to get her back in 6 months.  [name], MD 1/21/202112:06 PM

## 2019-10-04 LAB — IRON AND TIBC
Iron: 68 ug/dL (ref 41–142)
Saturation Ratios: 17 % — ABNORMAL LOW (ref 21–57)
TIBC: 389 ug/dL (ref 236–444)
UIBC: 321 ug/dL (ref 120–384)

## 2019-10-04 LAB — KAPPA/LAMBDA LIGHT CHAINS
Kappa free light chain: 38.7 mg/L — ABNORMAL HIGH (ref 3.3–19.4)
Kappa, lambda light chain ratio: 1.29 (ref 0.26–1.65)
Lambda free light chains: 30 mg/L — ABNORMAL HIGH (ref 5.7–26.3)

## 2019-10-04 LAB — FERRITIN: Ferritin: 55 ng/mL (ref 11–307)

## 2019-10-04 LAB — IGG, IGA, IGM
IgA: 66 mg/dL — ABNORMAL LOW (ref 87–352)
IgG (Immunoglobin G), Serum: 1671 mg/dL — ABNORMAL HIGH (ref 586–1602)
IgM (Immunoglobulin M), Srm: 40 mg/dL (ref 26–217)

## 2019-10-07 LAB — IMMUNOFIXATION REFLEX, SERUM
IgA: 70 mg/dL — ABNORMAL LOW (ref 87–352)
IgG (Immunoglobin G), Serum: 1667 mg/dL — ABNORMAL HIGH (ref 586–1602)
IgM (Immunoglobulin M), Srm: 48 mg/dL (ref 26–217)

## 2019-10-07 LAB — PROTEIN ELECTROPHORESIS, SERUM, WITH REFLEX
A/G Ratio: 1 (ref 0.7–1.7)
Albumin ELP: 4 g/dL (ref 2.9–4.4)
Alpha-1-Globulin: 0.2 g/dL (ref 0.0–0.4)
Alpha-2-Globulin: 1 g/dL (ref 0.4–1.0)
Beta Globulin: 1.2 g/dL (ref 0.7–1.3)
Gamma Globulin: 1.7 g/dL (ref 0.4–1.8)
Globulin, Total: 4 g/dL — ABNORMAL HIGH (ref 2.2–3.9)
M-Spike, %: 1.1 g/dL — ABNORMAL HIGH
SPEP Interpretation: 0
Total Protein ELP: 8 g/dL (ref 6.0–8.5)

## 2019-10-08 ENCOUNTER — Telehealth: Payer: Self-pay | Admitting: *Deleted

## 2019-10-08 NOTE — Telephone Encounter (Signed)
-----   Message from [name], MD sent at 10/07/2019  9:51 PM EST ----- Call - the myeloma protein is holding steady at 1.1.  Laurey Arrow

## 2019-10-08 NOTE — Telephone Encounter (Signed)
Patient notified per order of Dr. Marin Olp that "the myeloma protein is holding steady at 1.1."  Pt appreciative of call and has no questions or concerns at this time.

## 2019-10-09 ENCOUNTER — Other Ambulatory Visit: Payer: Self-pay | Admitting: Family

## 2019-10-09 DIAGNOSIS — D509 Iron deficiency anemia, unspecified: Secondary | ICD-10-CM | POA: Insufficient documentation

## 2019-10-10 ENCOUNTER — Other Ambulatory Visit: Payer: Self-pay

## 2019-10-10 ENCOUNTER — Inpatient Hospital Stay: Payer: BC Managed Care – PPO

## 2019-10-10 VITALS — BP 114/68 | HR 72

## 2019-10-10 DIAGNOSIS — M791 Myalgia, unspecified site: Secondary | ICD-10-CM | POA: Diagnosis not present

## 2019-10-10 DIAGNOSIS — C9000 Multiple myeloma not having achieved remission: Secondary | ICD-10-CM | POA: Diagnosis not present

## 2019-10-10 DIAGNOSIS — K5900 Constipation, unspecified: Secondary | ICD-10-CM | POA: Diagnosis not present

## 2019-10-10 DIAGNOSIS — R5383 Other fatigue: Secondary | ICD-10-CM | POA: Diagnosis not present

## 2019-10-10 DIAGNOSIS — M2550 Pain in unspecified joint: Secondary | ICD-10-CM | POA: Diagnosis not present

## 2019-10-10 DIAGNOSIS — R12 Heartburn: Secondary | ICD-10-CM | POA: Diagnosis not present

## 2019-10-10 DIAGNOSIS — D509 Iron deficiency anemia, unspecified: Secondary | ICD-10-CM

## 2019-10-10 DIAGNOSIS — Z7984 Long term (current) use of oral hypoglycemic drugs: Secondary | ICD-10-CM | POA: Diagnosis not present

## 2019-10-10 DIAGNOSIS — R0602 Shortness of breath: Secondary | ICD-10-CM | POA: Diagnosis not present

## 2019-10-10 DIAGNOSIS — E1140 Type 2 diabetes mellitus with diabetic neuropathy, unspecified: Secondary | ICD-10-CM | POA: Diagnosis not present

## 2019-10-10 DIAGNOSIS — M7989 Other specified soft tissue disorders: Secondary | ICD-10-CM | POA: Diagnosis not present

## 2019-10-10 DIAGNOSIS — R42 Dizziness and giddiness: Secondary | ICD-10-CM | POA: Diagnosis not present

## 2019-10-10 DIAGNOSIS — Z79899 Other long term (current) drug therapy: Secondary | ICD-10-CM | POA: Diagnosis not present

## 2019-10-10 DIAGNOSIS — R002 Palpitations: Secondary | ICD-10-CM | POA: Diagnosis not present

## 2019-10-10 DIAGNOSIS — G6289 Other specified polyneuropathies: Secondary | ICD-10-CM | POA: Diagnosis not present

## 2019-10-10 MED ORDER — SODIUM CHLORIDE 0.9 % IV SOLN
200.0000 mg | Freq: Once | INTRAVENOUS | Status: AC
  Administered 2019-10-10: 200 mg via INTRAVENOUS
  Filled 2019-10-10: qty 200

## 2019-10-10 MED ORDER — SODIUM CHLORIDE 0.9 % IV SOLN
Freq: Once | INTRAVENOUS | Status: AC
  Filled 2019-10-10: qty 250

## 2019-10-10 NOTE — Patient Instructions (Signed)

## 2019-10-30 DIAGNOSIS — Z79891 Long term (current) use of opiate analgesic: Secondary | ICD-10-CM | POA: Diagnosis not present

## 2019-10-30 DIAGNOSIS — E1142 Type 2 diabetes mellitus with diabetic polyneuropathy: Secondary | ICD-10-CM | POA: Diagnosis not present

## 2019-10-30 DIAGNOSIS — G894 Chronic pain syndrome: Secondary | ICD-10-CM | POA: Diagnosis not present

## 2019-10-30 DIAGNOSIS — M961 Postlaminectomy syndrome, not elsewhere classified: Secondary | ICD-10-CM | POA: Diagnosis not present

## 2020-01-22 DIAGNOSIS — Z79891 Long term (current) use of opiate analgesic: Secondary | ICD-10-CM | POA: Diagnosis not present

## 2020-01-22 DIAGNOSIS — M961 Postlaminectomy syndrome, not elsewhere classified: Secondary | ICD-10-CM | POA: Diagnosis not present

## 2020-01-22 DIAGNOSIS — E1142 Type 2 diabetes mellitus with diabetic polyneuropathy: Secondary | ICD-10-CM | POA: Diagnosis not present

## 2020-01-22 DIAGNOSIS — G894 Chronic pain syndrome: Secondary | ICD-10-CM | POA: Diagnosis not present

## 2020-03-04 ENCOUNTER — Encounter: Payer: Self-pay | Admitting: Obstetrics and Gynecology

## 2020-03-11 DIAGNOSIS — R42 Dizziness and giddiness: Secondary | ICD-10-CM | POA: Diagnosis not present

## 2020-03-11 DIAGNOSIS — R2689 Other abnormalities of gait and mobility: Secondary | ICD-10-CM | POA: Diagnosis not present

## 2020-03-11 DIAGNOSIS — E1140 Type 2 diabetes mellitus with diabetic neuropathy, unspecified: Secondary | ICD-10-CM | POA: Diagnosis not present

## 2020-03-23 ENCOUNTER — Encounter: Payer: Self-pay | Admitting: Neurology

## 2020-03-23 ENCOUNTER — Other Ambulatory Visit: Payer: Self-pay

## 2020-03-23 ENCOUNTER — Ambulatory Visit (INDEPENDENT_AMBULATORY_CARE_PROVIDER_SITE_OTHER): Payer: BC Managed Care – PPO | Admitting: Neurology

## 2020-03-23 ENCOUNTER — Telehealth: Payer: Self-pay | Admitting: Neurology

## 2020-03-23 VITALS — BP 147/85 | HR 87 | Ht 66.0 in | Wt 208.0 lb

## 2020-03-23 DIAGNOSIS — R2689 Other abnormalities of gait and mobility: Secondary | ICD-10-CM

## 2020-03-23 DIAGNOSIS — R42 Dizziness and giddiness: Secondary | ICD-10-CM

## 2020-03-23 DIAGNOSIS — H814 Vertigo of central origin: Secondary | ICD-10-CM

## 2020-03-23 DIAGNOSIS — G6289 Other specified polyneuropathies: Secondary | ICD-10-CM

## 2020-03-23 DIAGNOSIS — R269 Unspecified abnormalities of gait and mobility: Secondary | ICD-10-CM | POA: Diagnosis not present

## 2020-03-23 NOTE — Telephone Encounter (Signed)
medicare/BCBS Auth: [id number] 9exp. 03/23/20 to 04/21/20) order sent to GI. They will reach out to the patient to schedule.

## 2020-03-23 NOTE — Progress Notes (Signed)
[facility]    Provider:  Dr Jaynee Eagles Requesting Provider: Carol Ada, MD Primary Care Provider:  Carol Ada, MD  CC:  Imbalance and neuropathy  HPI:  [name] is a 69 y.o. female here as requested by Carol Ada, MD for imbalance and neuropathy. PMHx type 2 diabetes, aortic atherosclerosis, monoclonal gammopathy followed by Dr. Marin Olp, hyperhidrosis, migraine, gastroparesis, other chronic pain, hypertension, constipation, sleep disorder unspecified, hyperlipidemia, anxiety, mild intermittent asthma, depression, obesity, vitamin D deficiency, smoldering multiple myeloma.  Patient has already been evaluated by neurology and in 2017 she is a neuromuscular expert Dr. Narda Amber, I reviewed Dr. Serita Grit notes: She is status post lumbar decompression and fusion at L3-S1, she recall waking up with shooting pain worse on the left side, pain has become more intense, stabbing and electric-like, numbness is constant and his pain is triggered by walking and standing, pain is improved with rest, she has been seeing pain management since 2014 who prescribed morphine and also has a spinal cord stimulator, she previously tried gabapentin, amitriptyline, Cymbalta, lidocaine patches, she had a normal EMG nerve conduction study in 2009, skin biopsy which was consistent with small fiber neuropathy, and imaging of the lumbar spine last performed in 2007 which showed postop arachnoiditis, central canal narrowing and epidural fat at L5-S1 and disc bulge at L3-L4.  Patient is here and states that she was unsure exactly when she was diagnosed but it started in 2005 after back surgery, unknown if this came from diabetes, she was diagnosed in 2007, for a while she can walk on her feet, she has imbalance and falls, partially due to vertigo in 2020 and fell into the laundry, she feels unsteady on her feet, she already saw ENT.  Patient is already been evaluated by neurology by neuromuscular  Dr. Narda Amber (see above). She is still having balance problems, she has not fallen on the floor, she is here with her daughter who also provides much information. She is due to start PT tomorrow for imbalance and for vertigo. She has had vertigo since May of 2020, all of a sudden she became dizzy and room spinning and nausea, she fell down and couldn't get up, then in November of 2020, she does not remember if movement made it worse, she feels like she can't walk straight, no more episodes of frank room spinning, but she does feel imbalanced which has been ongoing prior to the vertigo no dizziness when standing, the pain in the feet is stable but she can't walk straight, daughter provides much information. She still have a lot low back pain and shooting pain into the legs. Low back pain and shooting pain and burning continues. She still goes to pain management. Migraines are stable. She had a sleep study that was positive and one negative, she can wake with headaches. No other focal neurologic deficits, associated symptoms, inciting events or modifiable factors.  Reviewed notes, labs and imaging from outside physicians, which showed:  FINDINGS CT brain 01/2019: personally reviewed images and agree with the following: Brain: No evidence of acute infarction, hemorrhage, hydrocephalus,extra-axial collection or mass lesion/mass effect.  Atrophy and mild white matter hypodensities probably represents mild chronic small-vessel ischemic changes.  Vascular: No hyperdense vessel or unexpected calcification.  Skull: Normal. Negative for fracture or focal lesion.  Sinuses/Orbits: Mucosal thickening within the sphenoid sinuses noted as well as fluid in the LEFT sphenoid sinus.  Other: None.  IMPRESSION: 1. No evidence of acute intracranial abnormality. 2. Atrophy and probable  mild chronic small-vessel white matter ischemic changes. 3. Sphenoid sinus disease/sinusitis.  Review of Systems: Patient  complains of symptoms per HPI as well as the following symptoms: imbalance, neuropathy, chronic back pain. Pertinent negatives and positives per HPI. All others negative.   Social History   Socioeconomic History  . Marital status: Married    Spouse name: Not on file  . Number of children: Not on file  . Years of education: Not on file  . Highest education level: Not on file  Occupational History  . Not on file  Tobacco Use  . Smoking status: Never Smoker  . Smokeless tobacco: Never Used  . Tobacco comment: never used tobacco  Vaping Use  . Vaping Use: Never used  Substance and Sexual Activity  . Alcohol use: No    Alcohol/week: 0.0 standard drinks  . Drug use: No  . Sexual activity: Not on file  Other Topics Concern  . Not on file  Social History Narrative   Lives with husband and daughter in a one story home.  Has 2 children.     On disability since 2009 for depression, low back pain.   Update 03/23/2020 now on social security    Education: some college.   Right handed   Caffeine: probably two 16.9 oz diet cokes per day   Social Determinants of Health   Financial Resource Strain:   . Difficulty of Paying Living Expenses:   Food Insecurity:   . Worried About Charity fundraiser in the Last Year:   . Arboriculturist in the Last Year:   Transportation Needs:   . Film/video editor (Medical):   Marland Kitchen Lack of Transportation (Non-Medical):   Physical Activity:   . Days of Exercise per Week:   . Minutes of Exercise per Session:   Stress:   . Feeling of Stress :   Social Connections:   . Frequency of Communication with Friends and Family:   . Frequency of Social Gatherings with Friends and Family:   . Attends Religious Services:   . Active Member of Clubs or Organizations:   . Attends Archivist Meetings:   Marland Kitchen Marital Status:   Intimate Partner Violence:   . Fear of Current or Ex-Partner:   . Emotionally Abused:   Marland Kitchen Physically Abused:   . Sexually Abused:       Family History  Problem Relation Age of Onset  . Heart disease Mother   . CAD Mother   . Pneumonia Mother   . Leukemia Father   . Hepatitis Father   . Hypertension Sister   . Heart attack Sister   . Stroke Sister   . Stroke Maternal Grandfather   . Glaucoma Maternal Grandfather        "blind"  . Stroke Maternal Grandmother   . Other Maternal Grandmother        hardening of arteries  . Healthy Son   . Healthy Daughter   . Neuropathy Neg Hx        of what is known  . Diabetes Neg Hx        of what is known    Past Medical History:  Diagnosis Date  . Allergies   . Asthma    treated by pulmonologist   . Atherosclerosis of aorta ([place])    noted on xray  . Chronic pain   . Diabetes ([place])    type II  . Edema   . High cholesterol   .  History of nuclear stress test 11/12/2009   normal   . Hypertension   . IgG monoclonal gammopathy of uncertain significance    igG kappa monoclonal gammopathy of unkown significance (per notes from [employer]).  . Insomnia   . Low back pain   . Migraine headache   . Neuropathy associated with MGUS ([place])   . Peripheral neuropathy   . Smoldering multiple myeloma ([place]) 12/04/2017    Patient Active Problem List   Diagnosis Date Noted  . Iron deficiency anemia 10/09/2019  . Impaired mobility and activities of daily living 08/30/2018  . Multiple myeloma ([place]) 08/30/2018  . S/P spinal fusion 08/30/2018  . Lumbar degenerative disc disease 08/27/2018  . History of degenerative disc disease 08/15/2018  . Neck pain 08/15/2018  . Sensorineural hearing loss (SNHL), bilateral 08/15/2018  . Tinnitus of both ears 08/15/2018  . Smoldering multiple myeloma ([place]) 12/04/2017  . Breakdown (mechanical) of implanted electronic neurostimulator, generator, initial encounter ([place]) 08/12/2016  . Myalgia 08/12/2016  . Post laminectomy syndrome 08/12/2016  . Migraine with aura and without status migrainosus, not intractable 10/15/2015  . Intrinsic  asthma 01/11/2010  . Dyspnea 12/14/2009  . DIABETES MELLITUS, TYPE II 12/11/2009  . Hyperlipidemia 12/11/2009  . MIGRAINE HEADACHE 12/11/2009  . Hereditary and idiopathic peripheral neuropathy 12/11/2009  . Hypertension 12/11/2009  . Insomnia 12/11/2009  . EDEMA 12/11/2009  . Type 2 diabetes mellitus with diabetic polyneuropathy ([place]) 12/11/2009    Past Surgical History:  Procedure Laterality Date  . BACK SURGERY     T10-S1 Fusion Dr. Patrice Paradise  . BACK SURGERY     2 fusion in lower back (L4-S1) and neck  . breast cyst removal age 44    . CESAREAN SECTION     x2  . hernia repair at 86 months old    . miscarriage D&C    . NASAL SINUS SURGERY     x2  . NECK SURGERY    . pain stimulator in back    . TONSILLECTOMY      Current Outpatient Medications  Medication Sig Dispense Refill  . cyclobenzaprine (FLEXERIL) 5 MG tablet Take 5 mg by mouth 3 (three) times daily as needed.    Marland Kitchen HYDROcodone-acetaminophen (NORCO) 10-325 MG tablet TAKE 1 TABLET BY MOUTH EVERY SIX HOURS AS NEEDED DOSE CHANGE    . metFORMIN (GLUCOPHAGE-XR) 500 MG 24 hr tablet Take 1,000 mg by mouth 2 (two) times daily.     . metoprolol (TOPROL-XL) 100 MG 24 hr tablet Take 100 mg by mouth daily.      Marland Kitchen morphine (MS CONTIN) 60 MG 12 hr tablet Take 60 mg by mouth every 12 (twelve) hours.   0  . pravastatin (PRAVACHOL) 40 MG tablet Take 40 mg by mouth daily.  1  . venlafaxine XR (EFFEXOR-XR) 150 MG 24 hr capsule Take 150 mg by mouth daily with breakfast.     No current facility-administered medications for this visit.    Allergies as of 03/23/2020 - Review Complete 03/23/2020  Allergen Reaction Noted  . Other  03/23/2020  . Dristan [pheniramine-phenylephrine] Palpitations 03/23/2020    Vitals: BP (!) 147/85 (BP Location: Right Arm, Patient Position: Sitting)   Pulse 87   Ht _0  (1.676 m)   Wt 208 lb (94.3 kg)   BMI 33.57 kg/m  Last Weight:  Wt Readings from Last 1 Encounters:  03/23/20 208 lb (94.3 kg)    Last Height:   Ht Readings from Last 1 Encounters:  03/23/20 5'  6" (1.676 m)     Physical exam: Exam: Gen: NAD, conversant, well nourised, obese, well groomed                     CV: RRR, no MRG. No Carotid Bruits. No peripheral edema, warm, nontender Eyes: Conjunctivae clear without exudates or hemorrhage  Neuro: Detailed Neurologic Exam  Speech:    Speech is normal; fluent and spontaneous with normal comprehension.  Cognition:    The patient is oriented to person, place, and time;     recent and remote memory intact;     language fluent;     normal attention, concentration,     fund of knowledge Cranial Nerves:    The pupils are equal, round, and reactive to light. Attempted fundoscopy could not visualize. . Visual fields are full to finger confrontation. Extraocular movements are intact. Trigeminal sensation is intact and the muscles of mastication are normal. The face is symmetric. The palate elevates in the midline. Hearing intact. Voice is normal. Shoulder shrug is normal. The tongue has normal motion without fasciculations.   Coordination:    No dysmetria or ataxia  Gait:    Heel-toe and tandem gait with imbalance. Slightly wide based.  Motor Observation:    No asymmetry, no atrophy, and no involuntary movements noted. Tone:    Normal muscle tone.    Posture:    Slight Flexion at the waist    Strength:    Strength is V/V in the upper and lower limbs.      Sensation: decreased to all modalities to above the ankles     Reflex Exam:  DTR's:    Absent AJs. Hypo patellars. 2+ biceps.   Toes:    The toes are equivocal bilaterally.   Clonus:    Clonus is absent.    Assessment/Plan:  69 y.o. female here as requested by Carol Ada, MD for imbalance and neuropathy. PMHx type 2 diabetes, aortic atherosclerosis, monoclonal gammopathy followed by Dr. Marin Olp, hyperhidrosis, migraine, gastroparesis, other chronic pain, hypertension, constipation, sleep disorder  unspecified, hyperlipidemia, anxiety, mild intermittent asthma, depression, obesity, vitamin D deficiency, smoldering multiple myeloma.  --  Patient has already been evaluated by neurology by neuromuscular expert Dr. Narda Amber, I reviewed Dr. Serita Grit notes: She is status post lumbar decompression and fusion at L3-S1,  she had a normal EMG nerve conduction study in 2009, skin biopsy which was consistent with small fiber neuropathy, and imaging of the lumbar spine last performed in 2007 which showed postop arachnoiditis. Diagnoses was (and I agree) painful predominantly distal peripheral neuropathy which is small fiber secondary to diabetes and possibly MGUS as well as radiculopathy and prior arachnoiditis..  Nerve conduction study EMG did not reveal any evidence for large fiber sensorimotor polyneuropathy but did show L2-L5 radiculopathy affecting the left leg likely residual from her previous surgery. Not sure Neurology has more to offer her for her neuropathy as this has been ongoing for over 15 years and she is already been evaluated by neurology, she has had multiple lumbar surgeries, she is already had a spinal stimulator and is in pain management.  She is also tried multiple medications. Continue pain management, recommended trial of a spinal stimulator again (had hers removed in 2013)  -Gait ataxia is likely due to low back pain and neuropathy with vertigo. Will order MRI of the brain to rule out stroke, schwannoma or other etiology.  -Going to PT tomorrow for imbalance due to neuropathy and vestibular therapy.  -  recheck b12, 347 in 2017  Orders Placed This Encounter  Procedures  . MR BRAIN W WO CONTRAST  . B12 and Folate Panel  . Methylmalonic acid, serum  . Basic Metabolic Panel   RTC as needed  Cc: Carol Ada, MD  Sarina Ill, MD  [facility] [address] [place], [place] [address]  Phone [phone] Fax [phone]

## 2020-03-23 NOTE — Patient Instructions (Signed)
MRI of the brain  Physical therapy B12 today   Peripheral Neuropathy Peripheral neuropathy is a type of nerve damage. It affects nerves that carry signals between the spinal cord and the arms, legs, and the rest of the body (peripheral nerves). It does not affect nerves in the spinal cord or brain. In peripheral neuropathy, one nerve or a group of nerves may be damaged. Peripheral neuropathy is a broad category that includes many specific nerve disorders, like diabetic neuropathy, hereditary neuropathy, and carpal tunnel syndrome. What are the causes? This condition may be caused by:  Diabetes. This is the most common cause of peripheral neuropathy.  Nerve injury.  Pressure or stress on a nerve that lasts a long time.  Lack (deficiency) of B vitamins. This can result from alcoholism, poor diet, or a restricted diet.  Infections.  Autoimmune diseases, such as rheumatoid arthritis and systemic lupus erythematosus.  Nerve diseases that are passed from parent to child (inherited).  Some medicines, such as cancer medicines (chemotherapy).  Poisonous (toxic) substances, such as lead and mercury.  Too little blood flowing to the legs.  Kidney disease.  Thyroid disease. In some cases, the cause of this condition is not known. What are the signs or symptoms? Symptoms of this condition depend on which of your nerves is damaged. Common symptoms include:  Loss of feeling (numbness) in the feet, hands, or both.  Tingling in the feet, hands, or both.  Burning pain.  Very sensitive skin.  Weakness.  Not being able to move a part of the body (paralysis).  Muscle twitching.  Clumsiness or poor coordination.  Loss of balance.  Not being able to control your bladder.  Feeling dizzy.  Sexual problems. How is this diagnosed? Diagnosing and finding the cause of peripheral neuropathy can be difficult. Your health care provider will take your medical history and do a physical  exam. A neurological exam will also be done. This involves checking things that are affected by your brain, spinal cord, and nerves (nervous system). For example, your health care provider will check your reflexes, how you move, and what you can feel. You may have other tests, such as:  Blood tests.  Electromyogram (EMG) and nerve conduction tests. These tests check nerve function and how well the nerves are controlling the muscles.  Imaging tests, such as CT scans or MRI to rule out other causes of your symptoms.  Removing a small piece of nerve to be examined in a lab (nerve biopsy). This is rare.  Removing and examining a small amount of the fluid that surrounds the brain and spinal cord (lumbar puncture). This is rare. How is this treated? Treatment for this condition may involve:  Treating the underlying cause of the neuropathy, such as diabetes, kidney disease, or vitamin deficiencies.  Stopping medicines that can cause neuropathy, such as chemotherapy.  Medicine to relieve pain. Medicines may include: ? Prescription or over-the-counter pain medicine. ? Antiseizure medicine. ? Antidepressants. ? Pain-relieving patches that are applied to painful areas of skin.  Surgery to relieve pressure on a nerve or to destroy a nerve that is causing pain.  Physical therapy to help improve movement and balance.  Devices to help you move around (assistive devices). Follow these instructions at home: Medicines  Take over-the-counter and prescription medicines only as told by your health care provider. Do not take any other medicines without first asking your health care provider.  Do not drive or use heavy machinery while taking prescription pain medicine.  Lifestyle   Do not use any products that contain nicotine or tobacco, such as cigarettes and e-cigarettes. Smoking keeps blood from reaching damaged nerves. If you need help quitting, ask your health care provider.  Avoid or limit  alcohol. Too much alcohol can cause a vitamin B deficiency, and vitamin B is needed for healthy nerves.  Eat a healthy diet. This includes: ? Eating foods that are high in fiber, such as fresh fruits and vegetables, whole grains, and beans. ? Limiting foods that are high in fat and processed sugars, such as fried or sweet foods. General instructions   If you have diabetes, work closely with your health care provider to keep your blood sugar under control.  If you have numbness in your feet: ? Check every day for signs of injury or infection. Watch for redness, warmth, and swelling. ? Wear padded socks and comfortable shoes. These help protect your feet.  Develop a good support system. Living with peripheral neuropathy can be stressful. Consider talking with a mental health specialist or joining a support group.  Use assistive devices and attend physical therapy as told by your health care provider. This may include using a walker or a cane.  Keep all follow-up visits as told by your health care provider. This is important. Contact a health care provider if:  You have new signs or symptoms of peripheral neuropathy.  You are struggling emotionally from dealing with peripheral neuropathy.  Your pain is not well-controlled. Get help right away if:  You have an injury or infection that is not healing normally.  You develop new weakness in an arm or leg.  You fall frequently. Summary  Peripheral neuropathy is when the nerves in the arms, or legs are damaged, resulting in numbness, weakness, or pain.  There are many causes of peripheral neuropathy, including diabetes, pinched nerves, vitamin deficiencies, autoimmune disease, and hereditary conditions.  Diagnosing and finding the cause of peripheral neuropathy can be difficult. Your health care provider will take your medical history, do a physical exam, and do tests, including blood tests and nerve function tests.  Treatment  involves treating the underlying cause of the neuropathy and taking medicines to help control pain. Physical therapy and assistive devices may also help. This information is not intended to replace advice given to you by your health care provider. Make sure you discuss any questions you have with your health care provider. Document Revised: 08/11/2017 Document Reviewed: 11/07/2016 Elsevier Patient Education  2020 [employer].

## 2020-03-24 ENCOUNTER — Telehealth: Payer: Self-pay | Admitting: *Deleted

## 2020-03-24 ENCOUNTER — Ambulatory Visit: Payer: BC Managed Care – PPO | Attending: Family Medicine

## 2020-03-24 DIAGNOSIS — R42 Dizziness and giddiness: Secondary | ICD-10-CM

## 2020-03-24 DIAGNOSIS — R2681 Unsteadiness on feet: Secondary | ICD-10-CM | POA: Diagnosis not present

## 2020-03-24 DIAGNOSIS — R296 Repeated falls: Secondary | ICD-10-CM | POA: Insufficient documentation

## 2020-03-24 DIAGNOSIS — R2689 Other abnormalities of gait and mobility: Secondary | ICD-10-CM | POA: Diagnosis not present

## 2020-03-24 NOTE — Therapy (Signed)
[facility] [address] [place], [place], [address] Phone: [phone]   Fax:  [phone]  Physical Therapy Evaluation  Patient Details  Name: [name] MRN: [id number] Date of Birth: 05/06/1951 Referring Provider (PT): Carol Ada, MD   Encounter Date: 03/24/2020   PT End of Session - 03/24/20 1417    Visit Number 1    Number of Visits 13    Date for PT Re-Evaluation 06/22/20   POC for 6 weeks, Cert for 90 days   Authorization Type BCBS    PT Start Time 1317    PT Stop Time 1402    PT Time Calculation (min) 45 min    Activity Tolerance Patient tolerated treatment well    Behavior During Therapy [facility] for tasks assessed/performed           Past Medical History:  Diagnosis Date  . Allergies   . Asthma    treated by pulmonologist   . Atherosclerosis of aorta ([place])    noted on xray  . Chronic pain   . Diabetes ([place])    type II  . Edema   . High cholesterol   . History of nuclear stress test 11/12/2009   normal   . Hypertension   . IgG monoclonal gammopathy of uncertain significance    igG kappa monoclonal gammopathy of unkown significance (per notes from [employer]).  . Insomnia   . Low back pain   . Migraine headache   . Neuropathy associated with MGUS ([place])   . Peripheral neuropathy   . Smoldering multiple myeloma ([place]) 12/04/2017    Past Surgical History:  Procedure Laterality Date  . BACK SURGERY     T10-S1 Fusion Dr. Patrice Paradise  . BACK SURGERY     2 fusion in lower back (L4-S1) and neck  . breast cyst removal age 81    . CESAREAN SECTION     x2  . hernia repair at 86 months old    . miscarriage D&C    . NASAL SINUS SURGERY     x2  . NECK SURGERY    . pain stimulator in back    . TONSILLECTOMY      There were no vitals filed for this visit.    Subjective Assessment - 03/24/20 1322    Subjective Patient reports that she feels imbalanced, and has had some falls. No  injuries from the falls. Vertigo occured in May 2020 and Nov 2020. Reports that she has had a spinning sensation for a short duration. Believes that the vertigo has resolved, she feels more imbalanced now. Patient reports she does have neuropathy in the feet.Not currently using any AD within home or community.    Patient is accompained by: Family member   Daughter Sales promotion account executive)   Pertinent History Diabetes, HTN, Hyperlipidemia, Chronic Neuropathy, IgG Kappa Smoldering Myeloma, Vertigo, Depression, Obesity    How long can you walk comfortably? <20 minutes    Patient Stated Goals Be able to walk without feeling like going to fall over    Currently in Pain? Yes    Pain Score 6     Pain Location Foot    Pain Orientation Right    Pain Descriptors / Indicators Numbness   shocking   Pain Type Chronic pain    Pain Onset More than a month ago    Pain Frequency Constant    Aggravating Factors  everything    Pain Relieving Factors morphine  [facility] PT Assessment - 03/24/20 0001      Assessment   Medical Diagnosis Imbalance/Neuropathy    Referring Provider (PT) Carol Ada, MD    Hand Dominance Right    Prior Therapy Inpatient Rehab for Back      Precautions   Precautions Fall      Balance Screen   Has the patient fallen in the past 6 months Yes    How many times? 6   near falls 1-2x/day   Has the patient had a decrease in activity level because of a fear of falling?  No    Is the patient reluctant to leave their home because of a fear of falling?  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), spending time with dog      Cognition   Overall Cognitive Status Within Functional Limits for tasks assessed      Sensation   Light Touch Impaired by gross assessment    Additional Comments dimished sensation on LLE, patient reports numbness      Coordination   Gross Motor Movements are Fluid and Coordinated Yes      ROM / Strength   AROM / PROM / Strength AROM      AROM   Overall AROM  Within functional limits for tasks performed      Transfers   Transfers Sit to Stand;Stand to Sit    Sit to Stand 5: Supervision    Stand to Sit 5: Supervision      Ambulation/Gait   Ambulation/Gait Yes    Ambulation/Gait Assistance 4: Min guard;5: Supervision    Ambulation/Gait Assistance Details overall supervision for safety, intermittent CGA for steadying with gait due to imbalance. Patient demonstrates increased base of support with ambulation and slow gait speed. Currently ambulating without AD at this time.     Assistive device None    Gait Pattern Step-through pattern;Decreased step length - right;Decreased step length - left;Wide base of support    Ambulation Surface Level;Indoor      High Level Balance   High Level Balance Comments Completed M-CSTIB. Patient able to complete Situation 1 - avg 27 seconds, situation 2 - avg 21 seconds. Unable to complete situation 3 or 4 due to imbalance.                   Vestibular Assessment - 03/24/20 0001      Symptom Behavior   Subjective history of current problem Two episodes of postional vertigo May 2020 and Nov 2020. Still feels intermittent dizziness at times but no true vertigo sensation.     Type of Dizziness  Spinning;Vertigo    Duration of Dizziness <2 minutes    Symptom Nature Positional      Oculomotor Exam   Oculomotor Alignment Normal    Spontaneous Absent     Gaze-induced  Left beating nystagmus with L gaze;Left  beating nystagmus with R gaze    Saccades Intact      Oculomotor Exam-Fixation Suppressed    Left Head Impulse WFL    Right Head Impulse WFL      Positional Testing   Dix-Hallpike Dix-Hallpike Right;Dix-Hallpike Left      Dix-Hallpike Right   Dix-Hallpike Right Duration no symptoms    Dix-Hallpike Right Symptoms No nystagmus      Dix-Hallpike Left   Dix-Hallpike Left Duration no symptoms    Dix-Hallpike Left Symptoms No nystagmus              Objective measurements completed on examination: See above findings.               PT Education - 03/24/20 1420    Education Details Educated on POC and Public relations account executive) Educated Patient;Child(ren)    Methods Explanation    Comprehension Verbalized understanding            PT Short Term Goals - 03/24/20 1431      PT SHORT TERM GOAL #1   Title Patient will be independent with initial balance/vestibular HEP    Baseline no HEP at this time    Time 3    Period Weeks    Status New    Target Date 04/14/20      PT SHORT TERM GOAL #2   Title Patient will demo ability to complete situation 1 and 2 of M-CTSIB for 30 seconds to demonstrate improved balance    Time 3    Period Weeks    Status New    Target Date 04/14/20      PT SHORT TERM GOAL #3   Title Berg Balance Test to be assessed and LTG to be set as appropriate    Time 3    Period Weeks    Status New    Target Date 04/14/20             PT Long Term Goals - 03/24/20 1433      PT LONG TERM GOAL #1   Title Patient will be independent with final balance/vestibular HEP    Baseline no HEP at this time    Time 6    Period Weeks    Status New    Target Date 05/05/20      PT LONG TERM GOAL #2   Title Patient will demonstrate ability to hold situation 1-3 of M-CSTIB for 30 seconds, and situation 4 for >/= 15 seconds to demonstrate improved balance    Time 6    Period Weeks    Status  New    Target Date 05/05/20      PT LONG TERM GOAL #3   Title Patient will demonstrate ability to ambulate >300 ft w/ LRAD, Mod I, with no imbalance noted for improved community ambulation    Time 6    Period Weeks    Status New    Target Date 05/05/20      PT LONG TERM GOAL #4   Title Patient will verbalize understanding of fall prevention strategies within the home to reduce risk for falls.    Time 6    Period Weeks    Status New    Target Date 05/05/20      PT LONG TERM GOAL #5   Title Patient will increase Berg Balance Score by 5 points from baseline to demonstrate improved balance.    Time 6    Period Weeks  Status New    Target Date 05/05/20                  Plan - 03/24/20 1429    Clinical Impression Statement Patient is 69 y.o. female that was referred to Neuro OPPT services for Vertigo/Imbalance. Patient reports that she began to have Vertigo in May 2020. She has had some episodes of vertigo, but main concern is her imbalance at this time. She is having frequent falls, and reports near falls/stumbles 1-2x/week. Patient's PMH is significant for the following: Diabetes, HTN, Hyperlipidemia, Chronic Neuropathy, IgG Kappa Smoldering Myeloma, Vertigo, Depression, Obesity. Upon evaluation, patient demonstrates decreased balance, abnormal gait, and increased risk for falls. Patient will benefit from skilled PT services to address deficits listed above and maximize functional mobility.    Personal Factors and Comorbidities Comorbidity 3+    Comorbidities Diabetes, HTN, Hyperlipidemia, Chronic Neuropathy, IgG Kappa Smoldering Myeloma, Vertigo, Depression, Obesity    Examination-Activity Limitations Squat;Stand    Examination-Participation Restrictions Driving;Community Activity;Shop    Stability/Clinical Decision Making Evolving/Moderate complexity    Clinical Decision Making Moderate    Rehab Potential Good    PT Frequency 2x / week    PT Duration 6 weeks    PT  Treatment/Interventions ADLs/Self Care Home Management;Canalith Repostioning;Electrical Stimulation;DME Instruction;Gait training;Stair training;Functional mobility training;Therapeutic activities;Therapeutic exercise;Balance training;Neuromuscular re-education;Patient/family education;Manual techniques;Passive range of motion;Vestibular    PT Next Visit Plan Initiate Balance HEP. Complete Berg Balance Test. DVA    Consulted and Agree with Plan of Care Patient;Family member/caregiver    Family Member Consulted Daughter Nira Conn)           Patient will benefit from skilled therapeutic intervention in order to improve the following deficits and impairments:  Abnormal gait, Decreased balance, Decreased endurance, Difficulty walking, Impaired sensation, Dizziness, Decreased activity tolerance, Decreased strength, Pain, Postural dysfunction  Visit Diagnosis: Unsteadiness on feet  Frequent falls  Other abnormalities of gait and mobility  Dizziness and giddiness     Problem List Patient Active Problem List   Diagnosis Date Noted  . Iron deficiency anemia 10/09/2019  . Impaired mobility and activities of daily living 08/30/2018  . Multiple myeloma ([place]) 08/30/2018  . S/P spinal fusion 08/30/2018  . Lumbar degenerative disc disease 08/27/2018  . History of degenerative disc disease 08/15/2018  . Neck pain 08/15/2018  . Sensorineural hearing loss (SNHL), bilateral 08/15/2018  . Tinnitus of both ears 08/15/2018  . Smoldering multiple myeloma ([place]) 12/04/2017  . Breakdown (mechanical) of implanted electronic neurostimulator, generator, initial encounter ([place]) 08/12/2016  . Myalgia 08/12/2016  . Post laminectomy syndrome 08/12/2016  . Migraine with aura and without status migrainosus, not intractable 10/15/2015  . Intrinsic asthma 01/11/2010  . Dyspnea 12/14/2009  . DIABETES MELLITUS, TYPE II 12/11/2009  . Hyperlipidemia 12/11/2009  . MIGRAINE HEADACHE 12/11/2009  . Hereditary and  idiopathic peripheral neuropathy 12/11/2009  . Hypertension 12/11/2009  . Insomnia 12/11/2009  . EDEMA 12/11/2009  . Type 2 diabetes mellitus with diabetic polyneuropathy ([place]) 12/11/2009    Jones Bales, PT, DPT 03/24/2020, 5:23 PM  [facility] [address] [place], [place], [address] Phone: [phone]   Fax:  [phone]  Name: AHMANI DAOUD MRN: [id number] Date of Birth: 1951-02-10

## 2020-03-24 NOTE — Telephone Encounter (Signed)
-----   Message from Melvenia Beam, MD sent at 03/24/2020  7:53 AM EDT ----- Romelle Starcher, Patient has severe B12 deficiency(168). This can be a cause of multiple problems inckuding worsening neuropathy. Recommend injections asap. She can go to her pcp or here but will need injections for 6 months, then oral medications and close follow up with primary care. Please let patient know

## 2020-03-24 NOTE — Telephone Encounter (Signed)
Spoke with pt and discussed lab results per Dr. Jaynee Eagles which show severe B12 deficiency. Pt aware of the symptoms low B12 can cause and she feels she has many of them. She will proceed with B12 replacement via injections. She is considering doing the injections at PCP office but will start with the first one here at [facility] on Thursday 03/26/20 right after her 2:45 pm rehab session in neuro rehab next door. Pt aware the results were sent to Dr. Tamala Julian (PCP) and if she would like to have the injections there she will just need to call them so she they can write the orders and schedule. Her questions were answered. She verbalized appreciation for the call.

## 2020-03-24 NOTE — Telephone Encounter (Signed)
Yes, exactly thanls

## 2020-03-25 LAB — BASIC METABOLIC PANEL
BUN/Creatinine Ratio: 21 (ref 12–28)
BUN: 18 mg/dL (ref 8–27)
CO2: 26 mmol/L (ref 20–29)
Calcium: 10.1 mg/dL (ref 8.7–10.3)
Chloride: 100 mmol/L (ref 96–106)
Creatinine, Ser: 0.84 mg/dL (ref 0.57–1.00)
GFR calc Af Amer: 83 mL/min/{1.73_m2} (ref >59)
GFR calc non Af Amer: 72 mL/min/{1.73_m2} (ref >59)
Glucose: 157 mg/dL — ABNORMAL HIGH (ref 65–99)
Potassium: 3.6 mmol/L (ref 3.5–5.2)
Sodium: 139 mmol/L (ref 134–144)

## 2020-03-25 LAB — METHYLMALONIC ACID, SERUM: Methylmalonic Acid: 669 nmol/L — ABNORMAL HIGH (ref 0–378)

## 2020-03-25 LAB — B12 AND FOLATE PANEL
Folate: 7.5 ng/mL (ref >3.0)
Vitamin B-12: 168 pg/mL — ABNORMAL LOW (ref 232–1245)

## 2020-03-26 ENCOUNTER — Ambulatory Visit: Payer: BC Managed Care – PPO

## 2020-03-26 ENCOUNTER — Ambulatory Visit (INDEPENDENT_AMBULATORY_CARE_PROVIDER_SITE_OTHER): Payer: BC Managed Care – PPO | Admitting: *Deleted

## 2020-03-26 ENCOUNTER — Other Ambulatory Visit: Payer: Self-pay

## 2020-03-26 DIAGNOSIS — R2689 Other abnormalities of gait and mobility: Secondary | ICD-10-CM | POA: Diagnosis not present

## 2020-03-26 DIAGNOSIS — R296 Repeated falls: Secondary | ICD-10-CM | POA: Diagnosis not present

## 2020-03-26 DIAGNOSIS — R2681 Unsteadiness on feet: Secondary | ICD-10-CM | POA: Diagnosis not present

## 2020-03-26 DIAGNOSIS — E538 Deficiency of other specified B group vitamins: Secondary | ICD-10-CM | POA: Diagnosis not present

## 2020-03-26 DIAGNOSIS — R42 Dizziness and giddiness: Secondary | ICD-10-CM | POA: Diagnosis not present

## 2020-03-26 MED ORDER — CYANOCOBALAMIN 1000 MCG/ML IJ SOLN
1000.0000 ug | Freq: Once | INTRAMUSCULAR | Status: AC
  Administered 2020-03-26: 1000 ug via INTRAMUSCULAR

## 2020-03-26 NOTE — Telephone Encounter (Signed)
Patient stated today when she came for 1st B12 injection that she will ask her husband if he'll give her injections after next week. He used to give her allergy shots. She mentioned it would save her money. She is scheduled for 2nd injection next Tues, stated that was only day her daughter could bring her.

## 2020-03-26 NOTE — Patient Instructions (Signed)
Access Code: [email or web address] URL: [email or web address] Date: 03/26/2020 Prepared by: Baldomero Lamy  Exercises Romberg Stance with Eyes Closed - 1 x daily - 5 x weekly - 1 sets - 3 reps - 30 hold Romberg Stance with Head Nods - 1 x daily - 5 x weekly - 2 sets - 10 reps Tandem Stance - 1 x daily - 5 x weekly - 1 sets - 3 reps - 15-30 hold Single Leg Stance with Support - 1 x daily - 5 x weekly - 1 sets - 3 reps - 30 hold

## 2020-03-26 NOTE — Therapy (Signed)
[facility] [address] , [place], [address] Phone: [phone]   Fax:  [phone]  Physical Therapy Treatment  Patient Details  Name: [name] MRN: [id number] Date of Birth: 05/19/1951 Referring Provider (PT): Candace Smith, MD   Encounter Date: 03/26/2020   PT End of Session - 03/26/20 1458    Visit Number 2    Number of Visits 13    Date for PT Re-Evaluation 06/22/20   POC for 6 weeks, Cert for 90 days   Authorization Type BCBS    PT Start Time 1452   pt arriving late   PT Stop Time 1533    PT Time Calculation (min) 41 min    Activity Tolerance Patient tolerated treatment well    Behavior During Therapy WFL for tasks assessed/performed           Past Medical History:  Diagnosis Date  . Allergies   . Asthma    treated by pulmonologist   . Atherosclerosis of aorta ([facility])    noted on xray  . Chronic pain   . Diabetes ([facility])    type II  . Edema   . High cholesterol   . History of nuclear stress test 11/12/2009   normal   . Hypertension   . IgG monoclonal gammopathy of uncertain significance    igG kappa monoclonal gammopathy of unkown significance (per notes from [facility]).  . Insomnia   . Low back pain   . Migraine headache   . Neuropathy associated with MGUS ([facility])   . Peripheral neuropathy   . Smoldering multiple myeloma ([facility]) 12/04/2017    Past Surgical History:  Procedure Laterality Date  . BACK SURGERY     T10-S1 Fusion Dr. Cohen  . BACK SURGERY     2 fusion in lower back (L4-S1) and neck  . breast cyst removal age 18    . CESAREAN SECTION     x2  . hernia repair at 6 months old    . miscarriage D&C    . NASAL SINUS SURGERY     x2  . NECK SURGERY    . pain stimulator in back    . TONSILLECTOMY      There were no vitals filed for this visit.   Subjective Assessment - 03/26/20 1454    Subjective Patient reports that her B12 is extremely low, and is  recieving her first injection today after therapy. Patient reports that been having some pain in both feet. No changes since initial evaluation.    Patient is accompained by: Family member   Daughter (Heather)   Pertinent History Diabetes, HTN, Hyperlipidemia, Chronic Neuropathy, IgG Kappa Smoldering Myeloma, Vertigo, Depression, Obesity    How long can you walk comfortably? <20 minutes    Patient Stated Goals Be able to walk without feeling like going to fall over    Currently in Pain? Yes    Pain Score --   7 1/2   Pain Location Foot    Pain Orientation Right;Left    Pain Descriptors / Indicators Numbness    Pain Type Chronic pain    Pain Onset More than a month ago                   Vestibular Assessment - 03/26/20 0001      Visual Acuity   Static 8    Dynamic 7                      [facility] Adult PT Treatment/Exercise - 03/26/20 0001      Transfers   Transfers Sit to Stand;Stand to Sit    Sit to Stand 5: Supervision    Stand to Sit 5: Supervision      Ambulation/Gait   Ambulation/Gait Yes    Ambulation/Gait Assistance 5: Supervision    Ambulation/Gait Assistance Details throughout therapy gym with activities    Assistive device None    Gait Pattern Step-through pattern;Decreased step length - right;Decreased step length - left;Wide base of support    Ambulation Surface Level;Indoor      Standardized Balance Assessment   Standardized Balance Assessment Berg Balance Test      Berg Balance Test   Sit to Stand Able to stand without using hands and stabilize independently    Standing Unsupported Able to stand 2 minutes with supervision    Sitting with Back Unsupported but Feet Supported on Floor or Stool Able to sit safely and securely 2 minutes    Stand to Sit Sits safely with minimal use of hands    Transfers Able to transfer safely, minor use of hands    Standing Unsupported with Eyes Closed Able to stand 10 seconds with supervision    Standing  Ubsupported with Feet Together Able to place feet together independently and stand for 1 minute with supervision    From Standing, Reach Forward with Outstretched Arm Can reach forward >12 cm safely (5")    From Standing Position, Pick up Object from Floor Able to pick up shoe, needs supervision    From Standing Position, Turn to Look Behind Over each Shoulder Turn sideways only but maintains balance    Turn 360 Degrees Able to turn 360 degrees safely but slowly    Standing Unsupported, Alternately Place Feet on Step/Stool Able to complete 4 steps without aid or supervision    Standing Unsupported, One Foot in Front Able to take small step independently and hold 30 seconds    Standing on One Leg Tries to lift leg/unable to hold 3 seconds but remains standing independently    Total Score 40                  PT Education - 03/26/20 1528    Education Details Educated on initial HEP (balance exercises; see patient instructions)    Person(s) Educated Patient;Child(ren)    Methods Explanation;Demonstration;Handout    Comprehension Verbalized understanding;Returned demonstration            PT Short Term Goals - 03/26/20 1541      PT SHORT TERM GOAL #1   Title Patient will be independent with initial balance/vestibular HEP    Baseline HEP initiated    Time 3    Period Weeks    Status New    Target Date 04/14/20      PT SHORT TERM GOAL #2   Title Patient will demo ability to complete situation 1 and 2 of M-CTSIB for 30 seconds to demonstrate improved balance    Time 3    Period Weeks    Status New    Target Date 04/14/20      PT SHORT TERM GOAL #3   Title Berg Balance Test to be assessed and LTG to be set as appropriate    Baseline Berg Balance and LTG set on 03/26/20    Time 3    Period Weeks    Status Achieved    Target Date 04/14/20               PT Long Term Goals - 03/26/20 1542      PT LONG TERM GOAL #1   Title Patient will be independent with final  balance/vestibular HEP (ALL LTG's due 05/05/20)    Baseline HEP initiated    Time 6    Period Weeks    Status New      PT LONG TERM GOAL #2   Title Patient will demonstrate ability to hold situation 1-3 of M-CSTIB for 30 seconds, and situation 4 for >/= 15 seconds to demonstrate improved balance    Time 6    Period Weeks    Status New      PT LONG TERM GOAL #3   Title Patient will demonstrate ability to ambulate >300 ft w/ LRAD, Mod I, with no imbalance noted for improved community ambulation    Time 6    Period Weeks    Status New      PT LONG TERM GOAL #4   Title Patient will verbalize understanding of fall prevention strategies within the home to reduce risk for falls.    Time 6    Period Weeks    Status New      PT LONG TERM GOAL #5   Title Patient will increase Berg Balance Score by 5 points from baseline to demonstrate improved balance.    Baseline Baseline 40/56    Time 6    Period Weeks    Status New                 Plan - 03/26/20 1540    Clinical Impression Statement Today's skilled PT session focused on further balance assessment with Berg Balance, patient scoring 40/56 demonstrating significant risk for falls. Spent rest of session focused on establishing initial balance HEP. Pt will continue to benefit from skilled PT services to progress toward goals and reduce risk for falls.    Personal Factors and Comorbidities Comorbidity 3+    Comorbidities Diabetes, HTN, Hyperlipidemia, Chronic Neuropathy, IgG Kappa Smoldering Myeloma, Vertigo, Depression, Obesity    Examination-Activity Limitations Squat;Stand    Examination-Participation Restrictions Driving;Community Activity;Shop    Stability/Clinical Decision Making Evolving/Moderate complexity    Rehab Potential Good    PT Frequency 2x / week    PT Duration 6 weeks    PT Treatment/Interventions ADLs/Self Care Home Management;Canalith Repostioning;Electrical Stimulation;DME Instruction;Gait training;Stair  training;Functional mobility training;Therapeutic activities;Therapeutic exercise;Balance training;Neuromuscular re-education;Patient/family education;Manual techniques;Passive range of motion;Vestibular    PT Next Visit Plan How was HEP? Balance Exercises. Gait training    Consulted and Agree with Plan of Care Patient;Family member/caregiver    Family Member Consulted Daughter (Heather)           Patient will benefit from skilled therapeutic intervention in order to improve the following deficits and impairments:  Abnormal gait, Decreased balance, Decreased endurance, Difficulty walking, Impaired sensation, Dizziness, Decreased activity tolerance, Decreased strength, Pain, Postural dysfunction  Visit Diagnosis: Unsteadiness on feet  Frequent falls  Other abnormalities of gait and mobility  Dizziness and giddiness     Problem List Patient Active Problem List   Diagnosis Date Noted  . Iron deficiency anemia 10/09/2019  . Impaired mobility and activities of daily living 08/30/2018  . Multiple myeloma ([facility]) 08/30/2018  . S/P spinal fusion 08/30/2018  . Lumbar degenerative disc disease 08/27/2018  . History of degenerative disc disease 08/15/2018  . Neck pain 08/15/2018  . Sensorineural hearing loss (SNHL), bilateral 08/15/2018  . Tinnitus of both ears 08/15/2018  . Smoldering multiple myeloma ([facility]) 12/04/2017  .   Breakdown (mechanical) of implanted electronic neurostimulator, generator, initial encounter ([place]) 08/12/2016  . Myalgia 08/12/2016  . Post laminectomy syndrome 08/12/2016  . Migraine with aura and without status migrainosus, not intractable 10/15/2015  . Intrinsic asthma 01/11/2010  . Dyspnea 12/14/2009  . DIABETES MELLITUS, TYPE II 12/11/2009  . Hyperlipidemia 12/11/2009  . MIGRAINE HEADACHE 12/11/2009  . Hereditary and idiopathic peripheral neuropathy 12/11/2009  . Hypertension 12/11/2009  . Insomnia 12/11/2009  . EDEMA 12/11/2009  . Type 2 diabetes mellitus  with diabetic polyneuropathy ([place]) 12/11/2009    Jones Bales, PT, DPT 03/26/2020, 3:43 PM  [facility] [address] [place], [place], [address] Phone: [phone]   Fax:  [phone]  Name: [name] MRN: [id number] Date of Birth: [birth year]

## 2020-03-30 NOTE — Telephone Encounter (Signed)
Pt called office to cancel injection appt. Husband giving injection. Please send B12 injection prescription to pharmacy in [facility] in [place].

## 2020-03-31 ENCOUNTER — Other Ambulatory Visit: Payer: Self-pay | Admitting: Neurology

## 2020-03-31 ENCOUNTER — Ambulatory Visit: Payer: Medicare Other

## 2020-03-31 ENCOUNTER — Ambulatory Visit: Payer: BC Managed Care – PPO

## 2020-03-31 ENCOUNTER — Other Ambulatory Visit: Payer: Self-pay

## 2020-03-31 DIAGNOSIS — R42 Dizziness and giddiness: Secondary | ICD-10-CM | POA: Diagnosis not present

## 2020-03-31 DIAGNOSIS — R296 Repeated falls: Secondary | ICD-10-CM

## 2020-03-31 DIAGNOSIS — R2689 Other abnormalities of gait and mobility: Secondary | ICD-10-CM | POA: Diagnosis not present

## 2020-03-31 DIAGNOSIS — R2681 Unsteadiness on feet: Secondary | ICD-10-CM

## 2020-03-31 MED ORDER — CYANOCOBALAMIN 1000 MCG/ML IJ SOLN: 1000.0000 ug | INTRAMUSCULAR | 4 refills | Status: DC

## 2020-03-31 MED ORDER — CYANOCOBALAMIN 1000 MCG/ML IJ SOLN: INTRAMUSCULAR | 0 refills | Status: DC

## 2020-03-31 NOTE — Therapy (Signed)
[facility] [address] [place], [place], [address] Phone: [phone]   Fax:  [phone]  Physical Therapy Treatment  Patient Details  Name: [name] MRN: [id number] Date of Birth: 1951-05-28 Referring Provider (PT): Carol Ada, MD   Encounter Date: 03/31/2020   PT End of Session - 03/31/20 1018    Visit Number 3    Number of Visits 13    Date for PT Re-Evaluation 06/22/20   POC for 6 weeks, Cert for 90 days   Authorization Type BCBS    PT Start Time 1015    PT Stop Time 1100    PT Time Calculation (min) 45 min    Equipment Utilized During Treatment Gait belt    Activity Tolerance Patient tolerated treatment well    Behavior During Therapy WFL for tasks assessed/performed           Past Medical History:  Diagnosis Date   Allergies    Asthma    treated by pulmonologist    Atherosclerosis of aorta ([place])    noted on xray   Chronic pain    Diabetes ([place])    type II   Edema    High cholesterol    History of nuclear stress test 11/12/2009   normal    Hypertension    IgG monoclonal gammopathy of uncertain significance    igG kappa monoclonal gammopathy of unkown significance (per notes from [employer]).   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 Hulda Marin SURGERY     2 fusion in lower back (L4-S1) and neck   breast cyst removal age 78     CESAREAN SECTION     x2   hernia repair at 10 months old     miscarriage D&C     NASAL SINUS SURGERY     x2   NECK SURGERY     pain stimulator in back     TONSILLECTOMY      There were no vitals filed for this visit.   Subjective Assessment - 03/31/20 1020    Subjective Patient reports that she  almost fell this morning, did not sleep well last night. Reports HEP went well.    Patient is accompained by: Family member   Daughter Sales promotion account executive)   Pertinent History Diabetes, HTN, Hyperlipidemia, Chronic Neuropathy, IgG Kappa Smoldering Myeloma, Vertigo, Depression, Obesity    How long can you walk comfortably? <20 minutes    Patient Stated Goals Be able to walk without feeling like going to fall over    Currently in Pain? Yes    Pain Score 4     Pain Location Foot    Pain Orientation Right;Left    Pain Descriptors / Indicators Numbness    Pain Type Chronic pain    Pain Onset More than a month ago                             [facility] Adult PT Treatment/Exercise - 03/31/20 0001      Ambulation/Gait   Ambulation/Gait Yes    Ambulation/Gait Assistance 5: Supervision    Ambulation/Gait Assistance Details throughout therapy gym with activities  Assistive device None    Gait Pattern Step-through pattern;Decreased step length - right;Decreased step length - left;Wide base of support    Ambulation Surface Level;Indoor      High Level Balance   High Level Balance Activities Head turns    High Level Balance Comments completed horizontal/vertical head turns with gait around therapy gym, including 115 ft x 1 rep of each. PT providing CGA. Pt demo increased difficulty with horizontal > vertical head turns.                Balance Exercises - 03/31/20 0001      Balance Exercises: Standing   Standing Eyes Opened Narrow base of support (BOS);Head turns;Foam/compliant surface;Solid surface;Limitations    Standing Eyes Opened Limitations completed horizontal/vertical head turns with eyes open on firm surface and foam surface, 1 x 10 reps each.    Standing Eyes Closed Narrow base of support (BOS);Foam/compliant surface;Head turns;Solid surface;Limitations    Standing Eyes Closed Limitations progressed to completing horizontal/vertical head turns on firm surface with eyes closed  1 x 10 reps each. Also completed feet together and eyes closed, 3 x 30 seconds each on firm surface initially, progressed to completing on foam surface with patient able to hold approx 15 seconds. Pt tend to have increased sway to L.     Stepping Strategy Anterior;Posterior;10 reps;Limitations    Stepping Strategy Limitations completed anterior/posterior stepping strategy on red balance beam, with single UE support from countertop. completed 1 x 10 reps each direction. patient able to progress with x 4 reps without UE support with anterior stepping, unable to let go and maintain balance with posterior stepping strategy.     Gait with Head Turns --    Tandem Gait Forward;Intermittent upper extremity support;3 reps;Limitations    Tandem Gait Limitations completed tandem walking, x 3 laps down and back countertop with light UE support as needed.     Marching Solid surface;Intermittent upper extremity assist;Forwards;Limitations    Marching Limitations completed forward marching x 3 laps, down and back countertop with 3 second hold to promote SLS with completion. Patient demo difficulty with SLS on RLE. CGA and intermittent UE support from countertop             PT Education - 03/31/20 1102    Education Details educated on HEP Update (see patient instructions; new additions highlighted)    Person(s) Educated Patient    Methods Explanation;Demonstration;Handout    Comprehension Verbalized understanding;Returned demonstration            PT Short Term Goals - 03/26/20 1541      PT SHORT TERM GOAL #1   Title Patient will be independent with initial balance/vestibular HEP    Baseline HEP initiated    Time 3    Period Weeks    Status New    Target Date 04/14/20      PT SHORT TERM GOAL #2   Title Patient will demo ability to complete situation 1 and 2 of M-CTSIB for 30 seconds to demonstrate improved balance    Time 3    Period Weeks    Status New    Target Date 04/14/20      PT SHORT  TERM GOAL #3   Title Berg Balance Test to be assessed and LTG to be set as appropriate    Baseline Berg Balance and LTG set on 03/26/20    Time 3    Period Weeks    Status Achieved    Target Date 04/14/20  PT Long Term Goals - 03/26/20 1542      PT LONG TERM GOAL #1   Title Patient will be independent with final balance/vestibular HEP (ALL LTG's due 05/05/20)    Baseline HEP initiated    Time 6    Period Weeks    Status New      PT LONG TERM GOAL #2   Title Patient will demonstrate ability to hold situation 1-3 of M-CSTIB for 30 seconds, and situation 4 for >/= 15 seconds to demonstrate improved balance    Time 6    Period Weeks    Status New      PT LONG TERM GOAL #3   Title Patient will demonstrate ability to ambulate >300 ft w/ LRAD, Mod I, with no imbalance noted for improved community ambulation    Time 6    Period Weeks    Status New      PT LONG TERM GOAL #4   Title Patient will verbalize understanding of fall prevention strategies within the home to reduce risk for falls.    Time 6    Period Weeks    Status New      PT LONG TERM GOAL #5   Title Patient will increase Berg Balance Score by 5 points from baseline to demonstrate improved balance.    Baseline Baseline 40/56    Time 6    Period Weeks    Status New                 Plan - 03/31/20 1111    Clinical Impression Statement Today's skilled PT session included further balance training including complaint surace, vision removed, and standing high level balance activites. Patient tolerating progression of balance activites well, with tandem walking as addition to current HEP. Patient will continue to benefit from skilled PT services to progress toward all goals.    Personal Factors and Comorbidities Comorbidity 3+    Comorbidities Diabetes, HTN, Hyperlipidemia, Chronic Neuropathy, IgG Kappa Smoldering Myeloma, Vertigo, Depression, Obesity    Examination-Activity Limitations Squat;Stand     Examination-Participation Restrictions Driving;Community Activity;Shop    Stability/Clinical Decision Making Evolving/Moderate complexity    Rehab Potential Good    PT Frequency 2x / week    PT Duration 6 weeks    PT Treatment/Interventions ADLs/Self Care Home Management;Canalith Repostioning;Electrical Stimulation;DME Instruction;Gait training;Stair training;Functional mobility training;Therapeutic activities;Therapeutic exercise;Balance training;Neuromuscular re-education;Patient/family education;Manual techniques;Passive range of motion;Vestibular    PT Next Visit Plan How was HEP addition? Continue standing balance (foam/narrow BOS/head turns). Activites to promote SLS. High Level Balance activites.  Gait training    Consulted and Agree with Plan of Care Patient;Family member/caregiver    Family Member Consulted Daughter Nira Conn)           Patient will benefit from skilled therapeutic intervention in order to improve the following deficits and impairments:  Abnormal gait, Decreased balance, Decreased endurance, Difficulty walking, Impaired sensation, Dizziness, Decreased activity tolerance, Decreased strength, Pain, Postural dysfunction  Visit Diagnosis: Unsteadiness on feet  Frequent falls  Other abnormalities of gait and mobility  Dizziness and giddiness     Problem List Patient Active Problem List   Diagnosis Date Noted   Iron deficiency anemia 10/09/2019   Impaired mobility and activities of daily living 08/30/2018   Multiple myeloma ([place]) 08/30/2018   S/P spinal fusion 08/30/2018   Lumbar degenerative disc disease 08/27/2018   History of degenerative disc disease 08/15/2018   Neck pain 08/15/2018   Sensorineural hearing loss (SNHL), bilateral 08/15/2018  Tinnitus of both ears 08/15/2018   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   Breakdown (mechanical) of implanted electronic neurostimulator, generator, initial encounter ([place]) 08/12/2016   Myalgia  08/12/2016   Post laminectomy syndrome 08/12/2016   Migraine with aura and without status migrainosus, not intractable 10/15/2015   Intrinsic asthma 01/11/2010   Dyspnea 12/14/2009   DIABETES MELLITUS, TYPE II 12/11/2009   Hyperlipidemia 12/11/2009   MIGRAINE HEADACHE 12/11/2009   Hereditary and idiopathic peripheral neuropathy 12/11/2009   Hypertension 12/11/2009   Insomnia 12/11/2009   EDEMA 12/11/2009   Type 2 diabetes mellitus with diabetic polyneuropathy ([place]) 12/11/2009    Jones Bales, PT, DPT 03/31/2020, 11:14 AM  [facility] [address] [place], [place], [address] Phone: [phone]   Fax:  [phone]  Name: BRITTA LOUTH MRN: [id number] Date of Birth: Feb 03, 1951

## 2020-03-31 NOTE — Telephone Encounter (Signed)
Looks like there was already a script in there, I resent it.

## 2020-03-31 NOTE — Patient Instructions (Signed)
Access Code: [email or web address] URL: [email or web address] Date: 03/31/2020 Prepared by: Baldomero Lamy  Exercises Romberg Stance with Eyes Closed - 1 x daily - 5 x weekly - 1 sets - 3 reps - 30 hold Romberg Stance with Head Nods - 1 x daily - 5 x weekly - 2 sets - 10 reps Tandem Stance - 1 x daily - 5 x weekly - 1 sets - 3 reps - 15-30 hold Single Leg Stance with Support - 1 x daily - 5 x weekly - 1 sets - 3 reps - 30 hold Tandem Walking - 1 x daily - 5 x weekly - 3 sets - 10 reps - educated to complete with countertop support or in narrow hallway if can reach wall on both sides

## 2020-04-01 DIAGNOSIS — Z79891 Long term (current) use of opiate analgesic: Secondary | ICD-10-CM | POA: Diagnosis not present

## 2020-04-01 DIAGNOSIS — E1142 Type 2 diabetes mellitus with diabetic polyneuropathy: Secondary | ICD-10-CM | POA: Diagnosis not present

## 2020-04-01 DIAGNOSIS — G894 Chronic pain syndrome: Secondary | ICD-10-CM | POA: Diagnosis not present

## 2020-04-01 DIAGNOSIS — M961 Postlaminectomy syndrome, not elsewhere classified: Secondary | ICD-10-CM | POA: Diagnosis not present

## 2020-04-02 ENCOUNTER — Encounter: Payer: Self-pay | Admitting: Hematology & Oncology

## 2020-04-02 ENCOUNTER — Inpatient Hospital Stay: Payer: BC Managed Care – PPO | Attending: Hematology & Oncology | Admitting: Hematology & Oncology

## 2020-04-02 ENCOUNTER — Inpatient Hospital Stay: Payer: BC Managed Care – PPO

## 2020-04-02 ENCOUNTER — Other Ambulatory Visit: Payer: Self-pay

## 2020-04-02 VITALS — BP 131/63 | HR 83 | Temp 98.9°F | Resp 20 | Wt 209.0 lb

## 2020-04-02 DIAGNOSIS — M791 Myalgia, unspecified site: Secondary | ICD-10-CM | POA: Insufficient documentation

## 2020-04-02 DIAGNOSIS — R002 Palpitations: Secondary | ICD-10-CM | POA: Insufficient documentation

## 2020-04-02 DIAGNOSIS — K5900 Constipation, unspecified: Secondary | ICD-10-CM | POA: Diagnosis not present

## 2020-04-02 DIAGNOSIS — R12 Heartburn: Secondary | ICD-10-CM | POA: Diagnosis not present

## 2020-04-02 DIAGNOSIS — R42 Dizziness and giddiness: Secondary | ICD-10-CM | POA: Diagnosis not present

## 2020-04-02 DIAGNOSIS — D472 Monoclonal gammopathy: Secondary | ICD-10-CM

## 2020-04-02 DIAGNOSIS — E119 Type 2 diabetes mellitus without complications: Secondary | ICD-10-CM | POA: Diagnosis not present

## 2020-04-02 DIAGNOSIS — G6289 Other specified polyneuropathies: Secondary | ICD-10-CM | POA: Insufficient documentation

## 2020-04-02 DIAGNOSIS — R5383 Other fatigue: Secondary | ICD-10-CM | POA: Diagnosis not present

## 2020-04-02 DIAGNOSIS — M7989 Other specified soft tissue disorders: Secondary | ICD-10-CM | POA: Insufficient documentation

## 2020-04-02 DIAGNOSIS — M2550 Pain in unspecified joint: Secondary | ICD-10-CM | POA: Insufficient documentation

## 2020-04-02 DIAGNOSIS — D510 Vitamin B12 deficiency anemia due to intrinsic factor deficiency: Secondary | ICD-10-CM | POA: Insufficient documentation

## 2020-04-02 DIAGNOSIS — E1121 Type 2 diabetes mellitus with diabetic nephropathy: Secondary | ICD-10-CM | POA: Diagnosis not present

## 2020-04-02 DIAGNOSIS — Z79899 Other long term (current) drug therapy: Secondary | ICD-10-CM | POA: Insufficient documentation

## 2020-04-02 DIAGNOSIS — C9000 Multiple myeloma not having achieved remission: Secondary | ICD-10-CM

## 2020-04-02 DIAGNOSIS — R0602 Shortness of breath: Secondary | ICD-10-CM | POA: Diagnosis not present

## 2020-04-02 LAB — CBC WITH DIFFERENTIAL (CANCER CENTER ONLY)
Abs Immature Granulocytes: 0.04 10*3/uL (ref 0.00–0.07)
Basophils Absolute: 0.1 10*3/uL (ref 0.0–0.1)
Basophils Relative: 1 %
Eosinophils Absolute: 0.5 10*3/uL (ref 0.0–0.5)
Eosinophils Relative: 5 %
HCT: 41.3 % (ref 36.0–46.0)
Hemoglobin: 13.4 g/dL (ref 12.0–15.0)
Immature Granulocytes: 0 %
Lymphocytes Relative: 43 %
Lymphs Abs: 4.6 10*3/uL — ABNORMAL HIGH (ref 0.7–4.0)
MCH: 29.5 pg (ref 26.0–34.0)
MCHC: 32.4 g/dL (ref 30.0–36.0)
MCV: 90.8 fL (ref 80.0–100.0)
Monocytes Absolute: 1 10*3/uL (ref 0.1–1.0)
Monocytes Relative: 9 %
Neutro Abs: 4.5 10*3/uL (ref 1.7–7.7)
Neutrophils Relative %: 42 %
Platelet Count: 371 10*3/uL (ref 150–400)
RBC: 4.55 MIL/uL (ref 3.87–5.11)
RDW: 12.4 % (ref 11.5–15.5)
WBC Count: 10.7 10*3/uL — ABNORMAL HIGH (ref 4.0–10.5)
nRBC: 0 % (ref 0.0–0.2)

## 2020-04-02 LAB — CMP (CANCER CENTER ONLY)
ALT: 21 U/L (ref 0–44)
AST: 21 U/L (ref 15–41)
Albumin: 4 g/dL (ref 3.5–5.0)
Alkaline Phosphatase: 51 U/L (ref 38–126)
Anion gap: 11 (ref 5–15)
BUN: 19 mg/dL (ref 8–23)
CO2: 29 mmol/L (ref 22–32)
Calcium: 9.2 mg/dL (ref 8.9–10.3)
Chloride: 98 mmol/L (ref 98–111)
Creatinine: 0.77 mg/dL (ref 0.44–1.00)
GFR, Est AFR Am: >60 mL/min (ref >60)
GFR, Estimated: >60 mL/min (ref >60)
Glucose, Bld: 196 mg/dL — ABNORMAL HIGH (ref 70–99)
Potassium: 3.8 mmol/L (ref 3.5–5.1)
Sodium: 138 mmol/L (ref 135–145)
Total Bilirubin: 0.7 mg/dL (ref 0.3–1.2)
Total Protein: 7.9 g/dL (ref 6.5–8.1)

## 2020-04-02 NOTE — Progress Notes (Signed)
Hematology and Oncology Follow Up Visit  [name] [id number] Dec 15, 1950 69 y.o. 04/02/2020   Principle Diagnosis:  1. IgG Kappa smoldering myeloma 2. Chronic neuropathy 3. Pernicious Anemia  Current Therapy:   Velcade q week dosing (3/1) -- s/p cycle #2 -- d/c on 02/06/2018 for lack of effectiveness Vit B12 1000 mcg IM q month - start on 03/25/2020  Interim History:  [name] is here today for a follow-up.  Surprisingly enough, she was recently found to have vitamin B12 deficiency.  I am surprised by this.  We checked her for years ago and everything was okay with her vitamin B12.  I have to give her neurologist a lot of credit for finding this.  Back a week or so ago, her vitamin B12 level was 168.  Her methylmalonic acid was elevated at 670.  She is on weekly B12 right now.  When she gets 4 weeks of vitamin B12, then she will go to monthly B12 injections.  Her husband is doing this for her at home.  She has vertigo also.  She apparently woke up 1 day with vertigo.  She has had this in the past.  She is on nothing for this.  She is getting physical therapy to try to help cope with this.  When we last saw her, her monoclonal spike was 1.1 g/dL.  Her IgG level was 1670 mg/dL.  Her kappa light chain was 3.9 mg/dL.  All this is relatively stable.  She has neuropathy.  Maybe, the vitamin B12 supplementation will help this.  She is looking forward to going to the beach in September.  She and her family will go down to [facility].  She is doing okay with her back.  She had back surgery back in 2019.  There is no change in bowel or bladder habits.    Overall, her performance status is ECOG 1.  Medications:  Allergies as of 04/02/2020      Reactions   Other    Contact "old cold medicine" caused palpitations    Dristan [pheniramine-phenylephrine] Palpitations      Medication List       Accurate as of April 02, 2020  3:42 PM. If you have any questions, ask your  nurse or doctor.        cyanocobalamin 1000 MCG/ML injection Commonly known as: (VITAMIN B-12) Pt will receive 1000 mcg injection (IM) weekly for 3 weeks. 1st dose due 04/02/20, 2nd dose 04/09/2020 and 3rd dose 04/16/2020.   cyclobenzaprine 5 MG tablet Commonly known as: FLEXERIL Take 5 mg by mouth 3 (three) times daily as needed.   HYDROcodone-acetaminophen 10-325 MG tablet Commonly known as: NORCO TAKE 1 TABLET BY MOUTH EVERY SIX HOURS AS NEEDED DOSE CHANGE   metFORMIN 500 MG 24 hr tablet Commonly known as: GLUCOPHAGE-XR Take 1,000 mg by mouth 2 (two) times daily.   metoprolol succinate 100 MG 24 hr tablet Commonly known as: TOPROL-XL Take 100 mg by mouth daily.   morphine 60 MG 12 hr tablet Commonly known as: MS CONTIN Take 60 mg by mouth every 12 (twelve) hours.   pravastatin 40 MG tablet Commonly known as: PRAVACHOL Take 40 mg by mouth daily.   venlafaxine XR 150 MG 24 hr capsule Commonly known as: EFFEXOR-XR Take 150 mg by mouth daily with breakfast.       Allergies:  Allergies  Allergen Reactions   Other     Contact "old cold medicine" caused palpitations    Dristan [Pheniramine-Phenylephrine]  Palpitations    Past Medical History, Surgical history, Social history, and Family History were reviewed and updated.  Review of Systems: Review of Systems  Constitutional: Positive for malaise/fatigue.  HENT: Negative.   Eyes: Negative.   Respiratory: Positive for shortness of breath.   Cardiovascular: Positive for palpitations and leg swelling.  Gastrointestinal: Positive for constipation and heartburn.  Genitourinary: Negative.   Musculoskeletal: Positive for joint pain and myalgias.  Skin: Negative.   Neurological: Positive for tingling.  Endo/Heme/Allergies: Negative.   Psychiatric/Behavioral: Negative.      Physical Exam:  weight is 209 lb (94.8 kg) (abnormal). Her oral temperature is 98.9 F (37.2 C). Her blood pressure is 131/63 (abnormal) and her  pulse is 83. Her respiration is 20 and oxygen saturation is 97%.   Wt Readings from Last 3 Encounters:  04/02/20 (!) 209 lb (94.8 kg)  03/23/20 208 lb (94.3 kg)  10/03/19 209 lb (94.8 kg)  Is 1 weight 1 1% patient is no  Physical Exam Vitals reviewed.  HENT:     Head: Normocephalic and atraumatic.  Eyes:     Pupils: Pupils are equal, round, and reactive to light.  Cardiovascular:     Rate and Rhythm: Normal rate and regular rhythm.     Heart sounds: Normal heart sounds.  Pulmonary:     Effort: Pulmonary effort is normal.     Breath sounds: Normal breath sounds.  Abdominal:     General: Bowel sounds are normal.     Palpations: Abdomen is soft.  Musculoskeletal:        General: No tenderness or deformity. Normal range of motion.     Cervical back: Normal range of motion.  Lymphadenopathy:     Cervical: No cervical adenopathy.  Skin:    General: Skin is warm and dry.     Findings: No erythema or rash.  Neurological:     Mental Status: She is alert and oriented to person, place, and time.  Psychiatric:        Behavior: Behavior normal.        Thought Content: Thought content normal.        Judgment: Judgment normal.     Lab Results  Component Value Date   WBC 10.7 (H) 04/02/2020   HGB 13.4 04/02/2020   HCT 41.3 04/02/2020   MCV 90.8 04/02/2020   PLT 371 04/02/2020   Lab Results  Component Value Date   FERRITIN 55 10/03/2019   IRON 68 10/03/2019   TIBC 389 10/03/2019   UIBC 321 10/03/2019   IRONPCTSAT 17 (L) 10/03/2019   Lab Results  Component Value Date   RBC 4.55 04/02/2020   Lab Results  Component Value Date   KPAFRELGTCHN 38.7 (H) 10/03/2019   LAMBDASER 30.0 (H) 10/03/2019   KAPLAMBRATIO 1.29 10/03/2019   Lab Results  Component Value Date   IGGSERUM 1,667 (H) 10/03/2019   IGA 70 (L) 10/03/2019   IGMSERUM 48 10/03/2019   Lab Results  Component Value Date   TOTALPROTELP 8.0 10/03/2019   ALBUMINELP 4.0 10/03/2019   A1GS 0.2 10/03/2019   A2GS  1.0 10/03/2019   BETS 1.2 10/03/2019   BETA2SER 0.3 02/20/2015   GAMS 1.7 10/03/2019   MSPIKE 1.1 (H) 10/03/2019   SPEI * 02/20/2015     Chemistry      Component Value Date/Time   NA 138 04/02/2020 1455   NA 139 03/23/2020 1220   NA 140 03/27/2017 0804   K 3.8 04/02/2020 1455   K 3.7  03/27/2017 0804   CL 98 04/02/2020 1455   CL 94 (L) 02/20/2015 1010   CO2 29 04/02/2020 1455   CO2 28 03/27/2017 0804   BUN 19 04/02/2020 1455   BUN 18 03/23/2020 1220   BUN 14.9 03/27/2017 0804   CREATININE 0.77 04/02/2020 1455   CREATININE 0.9 03/27/2017 0804      Component Value Date/Time   CALCIUM 9.2 04/02/2020 1455   CALCIUM 10.0 03/27/2017 0804   ALKPHOS 51 04/02/2020 1455   ALKPHOS 69 03/27/2017 0804   AST 21 04/02/2020 1455   AST 26 03/27/2017 0804   ALT 21 04/02/2020 1455   ALT 28 03/27/2017 0804   BILITOT 0.7 04/02/2020 1455   BILITOT 0.76 03/27/2017 0804     Impression and Plan: Ms. Stamant is 69 yo white female with smoldering myeloma and also chronic neuropathy due to diabetes and chronic back issues.   She now has pernicious anemia.  It will be interesting to see how this affects her neuropathy if at all.  I do not see a problem with the smoldering myeloma.  Everything is relatively stable with this.  She has diabetes.  She is trying her best to try to keep this under control.  We will will follow her up every 6 months.  I think this is a good follow-up interval for her.  [name], MD 7/22/20213:42 PM

## 2020-04-03 ENCOUNTER — Telehealth: Payer: Self-pay | Admitting: Hematology & Oncology

## 2020-04-03 ENCOUNTER — Other Ambulatory Visit: Payer: Self-pay

## 2020-04-03 ENCOUNTER — Ambulatory Visit: Payer: BC Managed Care – PPO

## 2020-04-03 DIAGNOSIS — R2689 Other abnormalities of gait and mobility: Secondary | ICD-10-CM | POA: Diagnosis not present

## 2020-04-03 DIAGNOSIS — R296 Repeated falls: Secondary | ICD-10-CM | POA: Diagnosis not present

## 2020-04-03 DIAGNOSIS — R2681 Unsteadiness on feet: Secondary | ICD-10-CM

## 2020-04-03 DIAGNOSIS — R42 Dizziness and giddiness: Secondary | ICD-10-CM

## 2020-04-03 LAB — KAPPA/LAMBDA LIGHT CHAINS
Kappa free light chain: 42.7 mg/L — ABNORMAL HIGH (ref 3.3–19.4)
Kappa, lambda light chain ratio: 1.43 (ref 0.26–1.65)
Lambda free light chains: 29.9 mg/L — ABNORMAL HIGH (ref 5.7–26.3)

## 2020-04-03 LAB — IGG, IGA, IGM
IgA: 73 mg/dL — ABNORMAL LOW (ref 87–352)
IgG (Immunoglobin G), Serum: 1493 mg/dL (ref 586–1602)
IgM (Immunoglobulin M), Srm: 44 mg/dL (ref 26–217)

## 2020-04-03 NOTE — Therapy (Signed)
[facility] [address] [place], [place], [address] Phone: [phone]   Fax:  [phone]  Physical Therapy Treatment  Patient Details  Name: [name] MRN: [id number] Date of Birth: 14-Dec-1950 Referring Provider (PT): Carol Ada, MD   Encounter Date: 04/03/2020   PT End of Session - 04/03/20 0937    Visit Number 4    Number of Visits 13    Date for PT Re-Evaluation 06/22/20   POC for 6 weeks, Cert for 90 days   Authorization Type BCBS    PT Start Time [phone]    PT Stop Time 1014    PT Time Calculation (min) 43 min    Equipment Utilized During Treatment Gait belt    Activity Tolerance Patient tolerated treatment well    Behavior During Therapy WFL for tasks assessed/performed           Past Medical History:  Diagnosis Date   Allergies    Asthma    treated by pulmonologist    Atherosclerosis of aorta ([place])    noted on xray   Chronic pain    Diabetes ([place])    type II   Edema    High cholesterol    History of nuclear stress test 11/12/2009   normal    Hypertension    IgG monoclonal gammopathy of uncertain significance    igG kappa monoclonal gammopathy of unkown significance (per notes from [employer]).   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 Hulda Marin SURGERY     2 fusion in lower back (L4-S1) and neck   breast cyst removal age 79     CESAREAN SECTION     x2   hernia repair at 70 months old     miscarriage D&C     NASAL SINUS SURGERY     x2   NECK SURGERY     pain stimulator in back     TONSILLECTOMY      There were no vitals filed for this visit.   Subjective Assessment - 04/03/20 0934    Subjective Patient reports feel tired  this morning due to having a busy week. Did sleep better last night. No new changes/complaints.    Patient is accompained by: Family member   Daughter Sales promotion account executive)   Pertinent History Diabetes, HTN, Hyperlipidemia, Chronic Neuropathy, IgG Kappa Smoldering Myeloma, Vertigo, Depression, Obesity    How long can you walk comfortably? <20 minutes    Patient Stated Goals Be able to walk without feeling like going to fall over    Currently in Pain? Yes    Pain Score 2     Pain Location Back    Pain Orientation Lower    Pain Descriptors / Indicators Aching    Pain Type Chronic pain    Pain Onset More than a month ago    Multiple Pain Sites Yes    Pain Score 5    Pain Location Foot    Pain Orientation Right;Left    Pain Descriptors / Indicators Aching    Pain Type Chronic pain    Pain Onset More than a month ago  [facility] Adult PT Treatment/Exercise - 04/03/20 0001      Ambulation/Gait   Ambulation/Gait Yes    Ambulation/Gait Assistance 5: Supervision    Ambulation/Gait Assistance Details throughout therapy gym with activities    Assistive device None    Gait Pattern Step-through pattern;Decreased step length - right;Decreased step length - left;Wide base of support    Ambulation Surface Level;Indoor      High Level Balance   High Level Balance Activities Tandem walking;Marching forwards;Side stepping    High Level Balance Comments in the parallel bars, completed the following tandem walking, marching forwards, and side stepping. with side stepping initially completed on firm surface and progressed to side stepping on blue balance with light fingertip support. With marching, focused on slow pace to further promote SLS. Completed x 4 laps, down and back of each, with CGA as needed.                Balance Exercises - 04/03/20 0001      Balance Exercises: Standing   Standing Eyes Opened Narrow base of support (BOS);Head turns;Foam/compliant  surface;Solid surface;Limitations    Standing Eyes Opened Limitations completed horizontal/vertical head turns with eyes open on firm surface and foam surface, 2 x 10 reps each. Patient demo increased difficulty with horizontal > vertical head turns.     Standing Eyes Closed Narrow base of support (BOS);Foam/compliant surface;Head turns;Solid surface;Limitations    Standing Eyes Closed Limitations completed feet together with eyes closed, 4 x 20-30 seconds.     Rockerboard Anterior/posterior;Lateral;Intermittent UE support;Limitations;EO;EC    Rockerboard Limitations standing static on rockerboard, ant/post and laterally, intially completed with EO and without UE support, able to hold 2 x 30 seconds. Progressed to completing with EC,                PT Short Term Goals - 03/26/20 1541      PT SHORT TERM GOAL #1   Title Patient will be independent with initial balance/vestibular HEP    Baseline HEP initiated    Time 3    Period Weeks    Status New    Target Date 04/14/20      PT SHORT TERM GOAL #2   Title Patient will demo ability to complete situation 1 and 2 of M-CTSIB for 30 seconds to demonstrate improved balance    Time 3    Period Weeks    Status New    Target Date 04/14/20      PT SHORT TERM GOAL #3   Title Berg Balance Test to be assessed and LTG to be set as appropriate    Baseline Berg Balance and LTG set on 03/26/20    Time 3    Period Weeks    Status Achieved    Target Date 04/14/20             PT Long Term Goals - 03/26/20 1542      PT LONG TERM GOAL #1   Title Patient will be independent with final balance/vestibular HEP (ALL LTG's due 05/05/20)    Baseline HEP initiated    Time 6    Period Weeks    Status New      PT LONG TERM GOAL #2   Title Patient will demonstrate ability to hold situation 1-3 of M-CSTIB for 30 seconds, and situation 4 for >/= 15 seconds to demonstrate improved balance    Time 6    Period Weeks    Status New      PT  LONG TERM  GOAL #3   Title Patient will demonstrate ability to ambulate >300 ft w/ LRAD, Mod I, with no imbalance noted for improved community ambulation    Time 6    Period Weeks    Status New      PT LONG TERM GOAL #4   Title Patient will verbalize understanding of fall prevention strategies within the home to reduce risk for falls.    Time 6    Period Weeks    Status New      PT LONG TERM GOAL #5   Title Patient will increase Berg Balance Score by 5 points from baseline to demonstrate improved balance.    Baseline Baseline 40/56    Time 6    Period Weeks    Status New                 Plan - 04/03/20 1016    Clinical Impression Statement Continued progression of balance activites as tolerated by patient, including complaint surfaces and using less UE support with activities. Patient able to progress to side stepping on complaint surface. Session limited due to fatigue today. Patient will continue to benefit from skilled PT services to progress toward goals.    Personal Factors and Comorbidities Comorbidity 3+    Comorbidities Diabetes, HTN, Hyperlipidemia, Chronic Neuropathy, IgG Kappa Smoldering Myeloma, Vertigo, Depression, Obesity    Examination-Activity Limitations Squat;Stand    Examination-Participation Restrictions Driving;Community Activity;Shop    Stability/Clinical Decision Making Evolving/Moderate complexity    Rehab Potential Good    PT Frequency 2x / week    PT Duration 6 weeks    PT Treatment/Interventions ADLs/Self Care Home Management;Canalith Repostioning;Electrical Stimulation;DME Instruction;Gait training;Stair training;Functional mobility training;Therapeutic activities;Therapeutic exercise;Balance training;Neuromuscular re-education;Patient/family education;Manual techniques;Passive range of motion;Vestibular    PT Next Visit Plan Continue standing balance (foam/narrow BOS/head turns). Activites to promote SLS. High Level Balance activites.  Gait training     Consulted and Agree with Plan of Care Patient;Family member/caregiver    Family Member Consulted Daughter Nira Conn)           Patient will benefit from skilled therapeutic intervention in order to improve the following deficits and impairments:  Abnormal gait, Decreased balance, Decreased endurance, Difficulty walking, Impaired sensation, Dizziness, Decreased activity tolerance, Decreased strength, Pain, Postural dysfunction  Visit Diagnosis: Unsteadiness on feet  Frequent falls  Other abnormalities of gait and mobility  Dizziness and giddiness     Problem List Patient Active Problem List   Diagnosis Date Noted   Iron deficiency anemia 10/09/2019   Impaired mobility and activities of daily living 08/30/2018   Multiple myeloma ([place]) 08/30/2018   S/P spinal fusion 08/30/2018   Lumbar degenerative disc disease 08/27/2018   History of degenerative disc disease 08/15/2018   Neck pain 08/15/2018   Sensorineural hearing loss (SNHL), bilateral 08/15/2018   Tinnitus of both ears 08/15/2018   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   Breakdown (mechanical) of implanted electronic neurostimulator, generator, initial encounter ([place]) 08/12/2016   Myalgia 08/12/2016   Post laminectomy syndrome 08/12/2016   Migraine with aura and without status migrainosus, not intractable 10/15/2015   Intrinsic asthma 01/11/2010   Dyspnea 12/14/2009   DIABETES MELLITUS, TYPE II 12/11/2009   Hyperlipidemia 12/11/2009   MIGRAINE HEADACHE 12/11/2009   Hereditary and idiopathic peripheral neuropathy 12/11/2009   Hypertension 12/11/2009   Insomnia 12/11/2009   EDEMA 12/11/2009   Type 2 diabetes mellitus with diabetic polyneuropathy ([place]) 12/11/2009    Jones Bales, PT, DPT 04/03/2020, 10:18 AM  [facility] [address] [place], [place], [address] Phone: [phone]   Fax:  [phone]  Name: [name] MRN: [id number] Date of Birth: 08-22-1951

## 2020-04-03 NOTE — Telephone Encounter (Signed)
Appointments scheduled calendar mailed per 7/22 los

## 2020-04-06 ENCOUNTER — Ambulatory Visit: Payer: BC Managed Care – PPO | Admitting: Physical Therapy

## 2020-04-07 LAB — PROTEIN ELECTROPHORESIS, SERUM, WITH REFLEX
A/G Ratio: 1 (ref 0.7–1.7)
Albumin ELP: 3.7 g/dL (ref 2.9–4.4)
Alpha-1-Globulin: 0.2 g/dL (ref 0.0–0.4)
Alpha-2-Globulin: 0.9 g/dL (ref 0.4–1.0)
Beta Globulin: 1.2 g/dL (ref 0.7–1.3)
Gamma Globulin: 1.5 g/dL (ref 0.4–1.8)
Globulin, Total: 3.8 g/dL (ref 2.2–3.9)
M-Spike, %: 1.1 g/dL — ABNORMAL HIGH
SPEP Interpretation: 0
Total Protein ELP: 7.5 g/dL (ref 6.0–8.5)

## 2020-04-07 LAB — IMMUNOFIXATION REFLEX, SERUM
IgA: 79 mg/dL — ABNORMAL LOW (ref 87–352)
IgG (Immunoglobin G), Serum: 1604 mg/dL — ABNORMAL HIGH (ref 586–1602)
IgM (Immunoglobulin M), Srm: 48 mg/dL (ref 26–217)

## 2020-04-14 ENCOUNTER — Other Ambulatory Visit: Payer: Self-pay | Admitting: Neurology

## 2020-04-14 ENCOUNTER — Ambulatory Visit: Payer: BC Managed Care – PPO

## 2020-04-15 ENCOUNTER — Other Ambulatory Visit: Payer: Self-pay

## 2020-04-15 ENCOUNTER — Ambulatory Visit: Payer: BC Managed Care – PPO | Attending: Family Medicine

## 2020-04-15 DIAGNOSIS — R2689 Other abnormalities of gait and mobility: Secondary | ICD-10-CM | POA: Diagnosis not present

## 2020-04-15 DIAGNOSIS — R2681 Unsteadiness on feet: Secondary | ICD-10-CM | POA: Diagnosis not present

## 2020-04-15 DIAGNOSIS — R296 Repeated falls: Secondary | ICD-10-CM | POA: Diagnosis not present

## 2020-04-15 DIAGNOSIS — R42 Dizziness and giddiness: Secondary | ICD-10-CM

## 2020-04-15 NOTE — Therapy (Signed)
[facility] [address] [place] [address], [place], [address] Phone: [phone]   Fax:  [phone]  Physical Therapy Treatment  Patient Details  Name: [name] MRN: [id number] Date of Birth: 1951-01-02 Referring Provider (PT): Carol Ada, MD   Encounter Date: 04/15/2020   PT End of Session - 04/15/20 1540    Visit Number 5    Number of Visits 13    Date for PT Re-Evaluation 06/22/20   POC for 6 weeks, Cert for 90 days   Authorization Type BCBS    PT Start Time 3094   pt arriving late   PT Stop Time 1615    PT Time Calculation (min) 37 min    Equipment Utilized During Treatment Gait belt    Activity Tolerance Patient tolerated treatment well    Behavior During Therapy [facility] for tasks assessed/performed           Past Medical History:  Diagnosis Date  . Allergies   . Asthma    treated by pulmonologist   . Atherosclerosis of aorta ([place])    noted on xray  . Chronic pain   . Diabetes ([place])    type II  . Edema   . High cholesterol   . History of nuclear stress test 11/12/2009   normal   . Hypertension   . IgG monoclonal gammopathy of uncertain significance    igG kappa monoclonal gammopathy of unkown significance (per notes from [employer]).  . Insomnia   . Low back pain   . Migraine headache   . Neuropathy associated with MGUS ([place])   . Peripheral neuropathy   . Smoldering multiple myeloma ([place]) 12/04/2017    Past Surgical History:  Procedure Laterality Date  . BACK SURGERY     T10-S1 Fusion Dr. Patrice Paradise  . BACK SURGERY     2 fusion in lower back (L4-S1) and neck  . breast cyst removal age 78    . CESAREAN SECTION     x2  . hernia repair at 17 months old    . miscarriage D&C    . NASAL SINUS SURGERY     x2  . NECK SURGERY    . pain stimulator in back    . TONSILLECTOMY      There were no vitals filed for this visit.   Subjective Assessment - 04/15/20 1541    Subjective Patient  reports that she has had a busy week.    Patient is accompained by: Family member   Daughter Sales promotion account executive)   Pertinent History Diabetes, HTN, Hyperlipidemia, Chronic Neuropathy, IgG Kappa Smoldering Myeloma, Vertigo, Depression, Obesity    How long can you walk comfortably? <20 minutes    Patient Stated Goals Be able to walk without feeling like going to fall over    Currently in Pain? Yes    Pain Score --   6 on Right, 5 1/2 on Left   Pain Location Foot    Pain Orientation Right;Left    Pain Descriptors / Indicators Aching    Pain Type Chronic pain    Pain Onset More than a month ago    Pain Score 3    Pain Location Head    Pain Orientation --   forehead   Pain Descriptors / Indicators Aching   sinus headache   Pain Type Acute pain    Pain Onset More than a month ago  [place] Adult PT Treatment/Exercise - 04/15/20 0001      Ambulation/Gait   Ambulation/Gait Yes    Ambulation/Gait Assistance 5: Supervision    Assistive device None    Gait Pattern Step-through pattern;Decreased step length - right;Decreased step length - left;Wide base of support    Ambulation Surface Level;Indoor           Vestibular Treatment/Exercise - 04/15/20 0001      Vestibular Treatment/Exercise   Gaze Exercises X1 Viewing Horizontal;X1 Viewing Vertical      X1 Viewing Horizontal   Foot Position completed seated without back support    Reps 2    Comments x 1 minute each, verbal cues for improved range and improved speed. Patient demo blurriness with completion      X1 Viewing Vertical   Foot Position seated without  back support    Reps 1    Comments x 1 min. no increased in symptoms (mild blurriness) but reports increased with horizontal > vertical               Balance Exercises - 04/15/20 0001      Balance Exercises: Standing   Standing Eyes Opened Narrow base of support (BOS);Head turns;Foam/compliant surface;Solid surface;Limitations     Standing Eyes Opened Limitations completed horizontal/vertical head turns with eyes open on firm surface and foam surface, 2 x 10 reps each. Patient demo increased difficulty with horizontal > vertical head turns.     Standing Eyes Closed Narrow base of support (BOS);Foam/compliant surface;Head turns;Solid surface;Limitations    Standing Eyes Closed Limitations completed feet together wit eyes closed, 3 x 30 seconds    Tandem Stance Eyes open;Foam/compliant surface;3 reps;30 secs;Limitations    Tandem Stance Time 20-30 seconds prior to require UE support. alternating foot positoin between reps.     Balance Beam Completed side stepping along blue balance beam, x 3 laps down and back with no UE support. CGA intermittently from PT.              PT Education - 04/15/20 1626    Education Details HEP Update    Person(s) Educated Patient;Child(ren)    Methods Explanation;Demonstration;Handout    Comprehension Verbalized understanding;Returned demonstration;Need further instruction            PT Short Term Goals - 03/26/20 1541      PT SHORT TERM GOAL #1   Title Patient will be independent with initial balance/vestibular HEP    Baseline HEP initiated    Time 3    Period Weeks    Status New    Target Date 04/14/20      PT SHORT TERM GOAL #2   Title Patient will demo ability to complete situation 1 and 2 of M-CTSIB for 30 seconds to demonstrate improved balance    Time 3    Period Weeks    Status New    Target Date 04/14/20      PT SHORT TERM GOAL #3   Title Berg Balance Test to be assessed and LTG to be set as appropriate    Baseline Berg Balance and LTG set on 03/26/20    Time 3    Period Weeks    Status Achieved    Target Date 04/14/20             PT Long Term Goals - 03/26/20 1542      PT LONG TERM GOAL #1   Title Patient will be independent with final balance/vestibular HEP (ALL LTG's due 05/05/20)  Baseline HEP initiated    Time 6    Period Weeks    Status New        PT LONG TERM GOAL #2   Title Patient will demonstrate ability to hold situation 1-3 of M-CSTIB for 30 seconds, and situation 4 for >/= 15 seconds to demonstrate improved balance    Time 6    Period Weeks    Status New      PT LONG TERM GOAL #3   Title Patient will demonstrate ability to ambulate >300 ft w/ LRAD, Mod I, with no imbalance noted for improved community ambulation    Time 6    Period Weeks    Status New      PT LONG TERM GOAL #4   Title Patient will verbalize understanding of fall prevention strategies within the home to reduce risk for falls.    Time 6    Period Weeks    Status New      PT LONG TERM GOAL #5   Title Patient will increase Berg Balance Score by 5 points from baseline to demonstrate improved balance.    Baseline Baseline 40/56    Time 6    Period Weeks    Status New                 Plan - 04/15/20 1627    Clinical Impression Statement Continued completion of balance activities focused on complaint surfaces and vision removed. Completed VOR x 1 with patient demo blurriness with horizontal head turns. Educated on completion and added to HEP. Patient will continue to benefit from skilled PT services.    Personal Factors and Comorbidities Comorbidity 3+    Comorbidities Diabetes, HTN, Hyperlipidemia, Chronic Neuropathy, IgG Kappa Smoldering Myeloma, Vertigo, Depression, Obesity    Examination-Activity Limitations Squat;Stand    Examination-Participation Restrictions Driving;Community Activity;Shop    Stability/Clinical Decision Making Evolving/Moderate complexity    Rehab Potential Good    PT Frequency 2x / week    PT Duration 6 weeks    PT Treatment/Interventions ADLs/Self Care Home Management;Canalith Repostioning;Electrical Stimulation;DME Instruction;Gait training;Stair training;Functional mobility training;Therapeutic activities;Therapeutic exercise;Balance training;Neuromuscular re-education;Patient/family education;Manual  techniques;Passive range of motion;Vestibular    PT Next Visit Plan How was HEP addition? Continue standing balance (foam/narrow BOS/head turns). Activites to promote SLS. High Level Balance activites.  Gait training    Consulted and Agree with Plan of Care Patient;Family member/caregiver    Family Member Consulted Daughter Nira Conn)           Patient will benefit from skilled therapeutic intervention in order to improve the following deficits and impairments:  Abnormal gait, Decreased balance, Decreased endurance, Difficulty walking, Impaired sensation, Dizziness, Decreased activity tolerance, Decreased strength, Pain, Postural dysfunction  Visit Diagnosis: Unsteadiness on feet  Frequent falls  Other abnormalities of gait and mobility  Dizziness and giddiness     Problem List Patient Active Problem List   Diagnosis Date Noted  . Iron deficiency anemia 10/09/2019  . Impaired mobility and activities of daily living 08/30/2018  . Multiple myeloma ([place]) 08/30/2018  . S/P spinal fusion 08/30/2018  . Lumbar degenerative disc disease 08/27/2018  . History of degenerative disc disease 08/15/2018  . Neck pain 08/15/2018  . Sensorineural hearing loss (SNHL), bilateral 08/15/2018  . Tinnitus of both ears 08/15/2018  . 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 Breakdown (mechanical) of implanted electronic neurostimulator, generator, initial encounter ([place]) 08/12/2016  . Myalgia 08/12/2016  . Post laminectomy syndrome 08/12/2016  . Migraine with aura and  without status migrainosus, not intractable 10/15/2015  . Intrinsic asthma 01/11/2010  . Dyspnea 12/14/2009  . DIABETES MELLITUS, TYPE II 12/11/2009  . Hyperlipidemia 12/11/2009  . MIGRAINE HEADACHE 12/11/2009  . Hereditary and idiopathic peripheral neuropathy 12/11/2009  . Hypertension 12/11/2009  . Insomnia 12/11/2009  . EDEMA 12/11/2009  . Type 2 diabetes mellitus with diabetic polyneuropathy ([place]) 12/11/2009     Jones Bales, PT, DPT 04/15/2020, 4:28 PM  [facility] [address] [place], [place], [address] Phone: [phone]   Fax:  [phone]  Name: [name] MRN: [id number] Date of Birth: [birth year]

## 2020-04-15 NOTE — Patient Instructions (Signed)
Access Code: [email or web address] URL: [email or web address] Date: 04/15/2020 Prepared by: Baldomero Lamy  Exercises Romberg Stance with Eyes Closed - 1 x daily - 5 x weekly - 1 sets - 3 reps - 30 hold Romberg Stance with Head Nods - 1 x daily - 5 x weekly - 2 sets - 10 reps Tandem Stance - 1 x daily - 5 x weekly - 1 sets - 3 reps - 15-30 hold Single Leg Stance with Support - 1 x daily - 5 x weekly - 1 sets - 3 reps - 30 hold Tandem Walking - 1 x daily - 5 x weekly - 3 sets - 10 reps Seated Gaze Stabilization with Head Rotation - 2 x daily - 5 x weekly - 2 sets - complete for 1 minute hold

## 2020-04-17 ENCOUNTER — Other Ambulatory Visit: Payer: Self-pay

## 2020-04-17 ENCOUNTER — Ambulatory Visit: Payer: BC Managed Care – PPO

## 2020-04-17 DIAGNOSIS — R296 Repeated falls: Secondary | ICD-10-CM

## 2020-04-17 DIAGNOSIS — R2681 Unsteadiness on feet: Secondary | ICD-10-CM

## 2020-04-17 DIAGNOSIS — R2689 Other abnormalities of gait and mobility: Secondary | ICD-10-CM

## 2020-04-17 DIAGNOSIS — R42 Dizziness and giddiness: Secondary | ICD-10-CM

## 2020-04-17 NOTE — Therapy (Signed)
[facility] [address] [place], [place], [address] Phone: [phone]   Fax:  [phone]  Physical Therapy Treatment  Patient Details  Name: [name] MRN: [id number] Date of Birth: 01/29/1951 Referring Provider (PT): Carol Ada, MD   Encounter Date: 04/17/2020   PT End of Session - 04/17/20 1111    Visit Number 6    Number of Visits 13    Date for PT Re-Evaluation 06/22/20   POC for 6 weeks, Cert for 90 days   Authorization Type BCBS    PT Start Time 1105    PT Stop Time 1145    PT Time Calculation (min) 40 min    Equipment Utilized During Treatment Gait belt    Activity Tolerance Patient tolerated treatment well    Behavior During Therapy WFL for tasks assessed/performed           Past Medical History:  Diagnosis Date  . Allergies   . Asthma    treated by pulmonologist   . Atherosclerosis of aorta ([place])    noted on xray  . Chronic pain   . Diabetes ([place])    type II  . Edema   . High cholesterol   . History of nuclear stress test 11/12/2009   normal   . Hypertension   . IgG monoclonal gammopathy of uncertain significance    igG kappa monoclonal gammopathy of unkown significance (per notes from [facility]).  . Insomnia   . Low back pain   . Migraine headache   . Neuropathy associated with MGUS ([place])   . Peripheral neuropathy   . Smoldering multiple myeloma ([place]) 12/04/2017    Past Surgical History:  Procedure Laterality Date  . BACK SURGERY     T10-S1 Fusion Dr. Patrice Paradise  . BACK SURGERY     2 fusion in lower back (L4-S1) and neck  . breast cyst removal age 53    . CESAREAN SECTION     x2  . hernia repair at 59 months old    . miscarriage D&C    . NASAL SINUS SURGERY     x2  . NECK SURGERY    . pain stimulator in back    . TONSILLECTOMY      There were no vitals filed for this visit.   Subjective Assessment - 04/17/20 1109    Subjective Patient reports being fatigued  this morning.    Patient is accompained by: Family member   Daughter Sales promotion account executive)   Pertinent History Diabetes, HTN, Hyperlipidemia, Chronic Neuropathy, IgG Kappa Smoldering Myeloma, Vertigo, Depression, Obesity    How long can you walk comfortably? <20 minutes    Patient Stated Goals Be able to walk without feeling like going to fall over    Currently in Pain? Yes    Pain Score 4     Pain Location Foot    Pain Orientation Right;Left    Pain Descriptors / Indicators Aching    Pain Type Chronic pain    Pain Onset More than a month ago    Pain Onset More than a month ago                             [facility] Adult PT Treatment/Exercise - 04/17/20 0001      Ambulation/Gait   Ambulation/Gait Yes    Ambulation/Gait Assistance 5: Supervision    Ambulation/Gait Assistance Details throughout therapy gym with activities    Assistive  device None    Gait Pattern Step-through pattern;Decreased step length - right;Decreased step length - left;Wide base of support    Ambulation Surface Level;Indoor      High Level Balance   High Level Balance Activities Head turns    High Level Balance Comments Completed horizontal/vertical head turns, 1 x 115 ft each. Patient demo improved ability to complete vertical head turns > horizontal head turns. PT providing verbal cues for improved range with horizontal head turns.       Neuro Re-ed    Neuro Re-ed Details  Completed M-CSTIB. Patient able to complete situation 1-3 for full 30 seconds, sitatuon 4: for 17 seconds on avg.                Balance Exercises - 04/17/20 0001      Balance Exercises: Standing   Standing Eyes Opened Narrow base of support (BOS);Head turns;Foam/compliant surface;Solid surface;Limitations    Standing Eyes Opened Limitations completed horizontal/vertical head turns with eyes open on firm surface and foam surface, 2 x 10 reps each. Patient demo increased difficulty with horizontal > vertical head turns.      Standing Eyes Closed Wide (BOA);Head turns;Foam/compliant surface;Limitations    Standing Eyes Closed Limitations completed horizontal/vertical head turns with eyes closed, 2 x 10 reps each. Increased difficulty with increased speed of head movements    Rockerboard Anterior/posterior;Lateral;EO;EC;Intermittent UE support;Limitations    Rockerboard Limitations completed lateral weight shifting with board positioned laterally, 1 x 10 reps to bilateral direction. Completed EC on board positioned ant/post 3 x 30 seonds, and EC with board positioned laterally 3 x 30 seconds, increased difficulty with board positioned laterally and vision removed.                PT Short Term Goals - 04/17/20 1111      PT SHORT TERM GOAL #1   Title Patient will be independent with initial balance/vestibular HEP    Baseline Patient reports independence with HEP    Time 3    Period Weeks    Status Achieved    Target Date 04/14/20      PT SHORT TERM GOAL #2   Title Patient will demo ability to complete situation 1 and 2 of M-CTSIB for 30 seconds to demonstrate improved balance    Baseline Patient able to demo situation 1 and 2 for full 30 seconds    Time 3    Period Weeks    Status Achieved    Target Date 04/14/20      PT SHORT TERM GOAL #3   Title Berg Balance Test to be assessed and LTG to be set as appropriate    Baseline Berg Balance and LTG set on 03/26/20    Time 3    Period Weeks    Status Achieved    Target Date 04/14/20             PT Long Term Goals - 03/26/20 1542      PT LONG TERM GOAL #1   Title Patient will be independent with final balance/vestibular HEP (ALL LTG's due 05/05/20)    Baseline HEP initiated    Time 6    Period Weeks    Status New      PT LONG TERM GOAL #2   Title Patient will demonstrate ability to hold situation 1-3 of M-CSTIB for 30 seconds, and situation 4 for >/= 15 seconds to demonstrate improved balance    Time 6    Period Weeks  Status New      PT  LONG TERM GOAL #3   Title Patient will demonstrate ability to ambulate >300 ft w/ LRAD, Mod I, with no imbalance noted for improved community ambulation    Time 6    Period Weeks    Status New      PT LONG TERM GOAL #4   Title Patient will verbalize understanding of fall prevention strategies within the home to reduce risk for falls.    Time 6    Period Weeks    Status New      PT LONG TERM GOAL #5   Title Patient will increase Berg Balance Score by 5 points from baseline to demonstrate improved balance.    Baseline Baseline 40/56    Time 6    Period Weeks    Status New                 Plan - 04/17/20 1237    Clinical Impression Statement Today's skilled PT session included assessment of patient's progress toward STG's. Patient able to meet all STG today, demonstrating improvements with M-CTSIB, able to hold all situations for full time except for situation 4 at this time. Paitent will continue to benefit from skilled PT services to progress toward all LTGs.    Personal Factors and Comorbidities Comorbidity 3+    Comorbidities Diabetes, HTN, Hyperlipidemia, Chronic Neuropathy, IgG Kappa Smoldering Myeloma, Vertigo, Depression, Obesity    Examination-Activity Limitations Squat;Stand    Examination-Participation Restrictions Driving;Community Activity;Shop    Stability/Clinical Decision Making Evolving/Moderate complexity    Rehab Potential Good    PT Frequency 2x / week    PT Duration 6 weeks    PT Treatment/Interventions ADLs/Self Care Home Management;Canalith Repostioning;Electrical Stimulation;DME Instruction;Gait training;Stair training;Functional mobility training;Therapeutic activities;Therapeutic exercise;Balance training;Neuromuscular re-education;Patient/family education;Manual techniques;Passive range of motion;Vestibular    PT Next Visit Plan Continue standing balance (foam/narrow BOS/head turns). Activites to promote SLS. High Level Balance activites.  Gait training.  Focus on activites challenge vestibular system    Consulted and Agree with Plan of Care Patient;Family member/caregiver    Family Member Consulted Daughter Nira Conn)           Patient will benefit from skilled therapeutic intervention in order to improve the following deficits and impairments:  Abnormal gait, Decreased balance, Decreased endurance, Difficulty walking, Impaired sensation, Dizziness, Decreased activity tolerance, Decreased strength, Pain, Postural dysfunction  Visit Diagnosis: Unsteadiness on feet  Frequent falls  Other abnormalities of gait and mobility  Dizziness and giddiness     Problem List Patient Active Problem List   Diagnosis Date Noted  . Iron deficiency anemia 10/09/2019  . Impaired mobility and activities of daily living 08/30/2018  . Multiple myeloma ([place]) 08/30/2018  . S/P spinal fusion 08/30/2018  . Lumbar degenerative disc disease 08/27/2018  . History of degenerative disc disease 08/15/2018  . Neck pain 08/15/2018  . Sensorineural hearing loss (SNHL), bilateral 08/15/2018  . Tinnitus of both ears 08/15/2018  . 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 Breakdown (mechanical) of implanted electronic neurostimulator, generator, initial encounter ([place]) 08/12/2016  . Myalgia 08/12/2016  . Post laminectomy syndrome 08/12/2016  . Migraine with aura and without status migrainosus, not intractable 10/15/2015  . Intrinsic asthma 01/11/2010  . Dyspnea 12/14/2009  . DIABETES MELLITUS, TYPE II 12/11/2009  . Hyperlipidemia 12/11/2009  . MIGRAINE HEADACHE 12/11/2009  . Hereditary and idiopathic peripheral neuropathy 12/11/2009  . Hypertension 12/11/2009  . Insomnia 12/11/2009  . EDEMA 12/11/2009  . Type 2 diabetes  mellitus with diabetic polyneuropathy ([place]) 12/11/2009    Ples Specter, DPT 04/17/2020, 12:41 PM  [facility] [address] [place], [place], [address] Phone:  [phone]   Fax:  [phone]  Name: [name] MRN: [id number] Date of Birth: 08-12-1951

## 2020-04-20 ENCOUNTER — Other Ambulatory Visit: Payer: Self-pay | Admitting: *Deleted

## 2020-04-20 MED ORDER — CYANOCOBALAMIN 1000 MCG/ML IJ SOLN: INTRAMUSCULAR | 0 refills | Status: DC

## 2020-04-21 ENCOUNTER — Ambulatory Visit: Payer: BC Managed Care – PPO

## 2020-04-24 ENCOUNTER — Ambulatory Visit: Payer: BC Managed Care – PPO | Admitting: Physical Therapy

## 2020-04-24 ENCOUNTER — Other Ambulatory Visit: Payer: Self-pay

## 2020-04-24 DIAGNOSIS — R2681 Unsteadiness on feet: Secondary | ICD-10-CM | POA: Diagnosis not present

## 2020-04-24 DIAGNOSIS — R2689 Other abnormalities of gait and mobility: Secondary | ICD-10-CM | POA: Diagnosis not present

## 2020-04-24 DIAGNOSIS — R42 Dizziness and giddiness: Secondary | ICD-10-CM | POA: Diagnosis not present

## 2020-04-24 DIAGNOSIS — R296 Repeated falls: Secondary | ICD-10-CM | POA: Diagnosis not present

## 2020-04-24 NOTE — Therapy (Signed)
[facility] [address] [place], [place], [address] Phone: [phone]   Fax:  [phone]  Physical Therapy Treatment  Patient Details  Name: [name] MRN: [id number] Date of Birth: 05-07-1951 Referring Provider (PT): Carol Ada, MD   Encounter Date: 04/24/2020   PT End of Session - 04/24/20 1226    Visit Number 7    Number of Visits 13    Date for PT Re-Evaluation 06/22/20   POC for 6 weeks, Cert for 90 days   Authorization Type BCBS    PT Start Time [phone]    PT Stop Time 1015    PT Time Calculation (min) 39 min    Activity Tolerance Patient tolerated treatment well    Behavior During Therapy [facility] for tasks assessed/performed           Past Medical History:  Diagnosis Date  . Allergies   . Asthma    treated by pulmonologist   . Atherosclerosis of aorta ([place])    noted on xray  . Chronic pain   . Diabetes ([place])    type II  . Edema   . High cholesterol   . History of nuclear stress test 11/12/2009   normal   . Hypertension   . IgG monoclonal gammopathy of uncertain significance    igG kappa monoclonal gammopathy of unkown significance (per notes from [facility]).  . Insomnia   . Low back pain   . Migraine headache   . Neuropathy associated with MGUS ([place])   . Peripheral neuropathy   . Smoldering multiple myeloma ([place]) 12/04/2017    Past Surgical History:  Procedure Laterality Date  . BACK SURGERY     T10-S1 Fusion Dr. Patrice Paradise  . BACK SURGERY     2 fusion in lower back (L4-S1) and neck  . breast cyst removal age 35    . CESAREAN SECTION     x2  . hernia repair at 31 months old    . miscarriage D&C    . NASAL SINUS SURGERY     x2  . NECK SURGERY    . pain stimulator in back    . TONSILLECTOMY      There were no vitals filed for this visit.   Subjective Assessment - 04/24/20 0940    Subjective Had to cancel Tuesday due to a severe migraine; having some left over dizziness  and photosensitivity.  Daughter reports pt has a hard time stepping over objects and up curbs.    Patient is accompained by: Family member   Daughter Sales promotion account executive)   Pertinent History Diabetes, HTN, Hyperlipidemia, Chronic Neuropathy, IgG Kappa Smoldering Myeloma, Vertigo, Depression, Obesity    How long can you walk comfortably? <20 minutes    Patient Stated Goals Be able to walk without feeling like going to fall over    Pain Onset More than a month ago    Pain Onset More than a month ago                                  Balance Exercises - 04/24/20 0943      Balance Exercises: Standing   Standing, One Foot on a Step Eyes open;6 inch;5 reps;10 secs   2 sets each LE, hold x 10 sec, 5 reps foot lift off step   Step Ups Forward;6 inch;Intermittent UE support;Lateral;UE support 1   5 reps each side, forward tap  and back down, L/R   Step Over Hurdles / Cones Low obstacles performing forwards and laterally x 4 reps each x 2 sets R foot leading, L foot leading and then alternating forwards.             PT Education - 04/24/20 1226    Education Details added step up taps to HEP; educated pt on rose colored glasses she can wear for light sensitivity when having a migraine    Person(s) Educated Patient;Child(ren)    Methods Explanation;Demonstration;Handout    Comprehension Verbalized understanding;Returned demonstration            PT Short Term Goals - 04/17/20 1111      PT SHORT TERM GOAL #1   Title Patient will be independent with initial balance/vestibular HEP    Baseline Patient reports independence with HEP    Time 3    Period Weeks    Status Achieved    Target Date 04/14/20      PT SHORT TERM GOAL #2   Title Patient will demo ability to complete situation 1 and 2 of M-CTSIB for 30 seconds to demonstrate improved balance    Baseline Patient able to demo situation 1 and 2 for full 30 seconds    Time 3    Period Weeks    Status Achieved    Target Date  04/14/20      PT SHORT TERM GOAL #3   Title Berg Balance Test to be assessed and LTG to be set as appropriate    Baseline Berg Balance and LTG set on 03/26/20    Time 3    Period Weeks    Status Achieved    Target Date 04/14/20             PT Long Term Goals - 03/26/20 1542      PT LONG TERM GOAL #1   Title Patient will be independent with final balance/vestibular HEP (ALL LTG's due 05/05/20)    Baseline HEP initiated    Time 6    Period Weeks    Status New      PT LONG TERM GOAL #2   Title Patient will demonstrate ability to hold situation 1-3 of M-CSTIB for 30 seconds, and situation 4 for >/= 15 seconds to demonstrate improved balance    Time 6    Period Weeks    Status New      PT LONG TERM GOAL #3   Title Patient will demonstrate ability to ambulate >300 ft w/ LRAD, Mod I, with no imbalance noted for improved community ambulation    Time 6    Period Weeks    Status New      PT LONG TERM GOAL #4   Title Patient will verbalize understanding of fall prevention strategies within the home to reduce risk for falls.    Time 6    Period Weeks    Status New      PT LONG TERM GOAL #5   Title Patient will increase Berg Balance Score by 5 points from baseline to demonstrate improved balance.    Baseline Baseline 40/56    Time 6    Period Weeks    Status New                 Plan - 04/24/20 1227    Clinical Impression Statement Pt experiencing some residual symptoms from migraine so did not focus on vestibular exercises today.  Instead continued to focus on functional LE  strengthening and balance training to improve single limb stance for stepping over obstacles and negotiating stairs or curb more safely if UE support is not available.  Pt tolerated well; will continue to address and progress towards LTG.    Personal Factors and Comorbidities Comorbidity 3+    Comorbidities Diabetes, HTN, Hyperlipidemia, Chronic Neuropathy, IgG Kappa Smoldering Myeloma, Vertigo,  Depression, Obesity    Examination-Activity Limitations Squat;Stand    Examination-Participation Restrictions Driving;Community Activity;Shop    Stability/Clinical Decision Making Evolving/Moderate complexity    Rehab Potential Good    PT Frequency 2x / week    PT Duration 6 weeks    PT Treatment/Interventions ADLs/Self Care Home Management;Canalith Repostioning;Electrical Stimulation;DME Instruction;Gait training;Stair training;Functional mobility training;Therapeutic activities;Therapeutic exercise;Balance training;Neuromuscular re-education;Patient/family education;Manual techniques;Passive range of motion;Vestibular    PT Next Visit Plan Add lateral step ups to HEP; Continue standing balance (foam/narrow BOS/head turns). Activites to promote SLS. High Level Balance activites.  Gait training. Focus on activites challenge vestibular system    Consulted and Agree with Plan of Care Patient;Family member/caregiver    Family Member Consulted Daughter Nira Conn)           Patient will benefit from skilled therapeutic intervention in order to improve the following deficits and impairments:  Abnormal gait, Decreased balance, Decreased endurance, Difficulty walking, Impaired sensation, Dizziness, Decreased activity tolerance, Decreased strength, Pain, Postural dysfunction  Visit Diagnosis: Unsteadiness on feet  Frequent falls  Other abnormalities of gait and mobility     Problem List Patient Active Problem List   Diagnosis Date Noted  . Iron deficiency anemia 10/09/2019  . Impaired mobility and activities of daily living 08/30/2018  . Multiple myeloma ([place]) 08/30/2018  . S/P spinal fusion 08/30/2018  . Lumbar degenerative disc disease 08/27/2018  . History of degenerative disc disease 08/15/2018  . Neck pain 08/15/2018  . Sensorineural hearing loss (SNHL), bilateral 08/15/2018  . Tinnitus of both ears 08/15/2018  . 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 Breakdown  (mechanical) of implanted electronic neurostimulator, generator, initial encounter ([place]) 08/12/2016  . Myalgia 08/12/2016  . Post laminectomy syndrome 08/12/2016  . Migraine with aura and without status migrainosus, not intractable 10/15/2015  . Intrinsic asthma 01/11/2010  . Dyspnea 12/14/2009  . DIABETES MELLITUS, TYPE II 12/11/2009  . Hyperlipidemia 12/11/2009  . MIGRAINE HEADACHE 12/11/2009  . Hereditary and idiopathic peripheral neuropathy 12/11/2009  . Hypertension 12/11/2009  . Insomnia 12/11/2009  . EDEMA 12/11/2009  . Type 2 diabetes mellitus with diabetic polyneuropathy ([place]) 12/11/2009    Rico Junker, PT, DPT 04/24/20    12:30 PM    [facility] [address] [place], [place], [address] Phone: [phone]   Fax:  [phone]  Name: [name] MRN: [id number] Date of Birth: 06-30-1951

## 2020-04-24 NOTE — Patient Instructions (Signed)
Access Code: [email or web address] URL: [email or web address] Date: 04/24/2020 Prepared by: Misty Stanley  Exercises Romberg Stance with Eyes Closed - 1 x daily - 5 x weekly - 1 sets - 3 reps - 30 hold Romberg Stance with Head Nods - 1 x daily - 5 x weekly - 2 sets - 10 reps Tandem Stance - 1 x daily - 5 x weekly - 1 sets - 3 reps - 15-30 hold Single Leg Stance with Support - 1 x daily - 5 x weekly - 1 sets - 3 reps - 30 hold Tandem Walking - 1 x daily - 5 x weekly - 3 sets - 10 reps Seated Gaze Stabilization with Head Rotation - 2 x daily - 5 x weekly - 2 sets - complete for 1 minute hold Runner's Step Up/Down - 1 x daily - 7 x weekly - 2 sets - 5 reps

## 2020-04-27 ENCOUNTER — Other Ambulatory Visit: Payer: Self-pay

## 2020-04-27 ENCOUNTER — Ambulatory Visit: Payer: BC Managed Care – PPO

## 2020-04-27 DIAGNOSIS — R42 Dizziness and giddiness: Secondary | ICD-10-CM | POA: Diagnosis not present

## 2020-04-27 DIAGNOSIS — R2681 Unsteadiness on feet: Secondary | ICD-10-CM | POA: Diagnosis not present

## 2020-04-27 DIAGNOSIS — R2689 Other abnormalities of gait and mobility: Secondary | ICD-10-CM

## 2020-04-27 DIAGNOSIS — R296 Repeated falls: Secondary | ICD-10-CM | POA: Diagnosis not present

## 2020-04-27 NOTE — Therapy (Signed)
[facility] [address] [place], [place], [address] Phone: [phone]   Fax:  [phone]  Physical Therapy Treatment  Patient Details  Name: [name] MRN: [id number] Date of Birth: 10-Feb-1951 Referring Provider (PT): Carol Ada, MD   Encounter Date: 04/27/2020   PT End of Session - 04/27/20 1310    Visit Number 8    Number of Visits 13    Date for PT Re-Evaluation 06/22/20   POC for 6 weeks, Cert for 90 days   Authorization Type BCBS    PT Start Time 1102    PT Stop Time 1143    PT Time Calculation (min) 41 min    Equipment Utilized During Treatment Gait belt    Activity Tolerance Patient tolerated treatment well    Behavior During Therapy WFL for tasks assessed/performed           Past Medical History:  Diagnosis Date  . Allergies   . Asthma    treated by pulmonologist   . Atherosclerosis of aorta ([place])    noted on xray  . Chronic pain   . Diabetes ([place])    type II  . Edema   . High cholesterol   . History of nuclear stress test 11/12/2009   normal   . Hypertension   . IgG monoclonal gammopathy of uncertain significance    igG kappa monoclonal gammopathy of unkown significance (per notes from [employer]).  . Insomnia   . Low back pain   . Migraine headache   . Neuropathy associated with MGUS ([place])   . Peripheral neuropathy   . Smoldering multiple myeloma ([place]) 12/04/2017    Past Surgical History:  Procedure Laterality Date  . BACK SURGERY     T10-S1 Fusion Dr. Patrice Paradise  . BACK SURGERY     2 fusion in lower back (L4-S1) and neck  . breast cyst removal age 67    . CESAREAN SECTION     x2  . hernia repair at 59 months old    . miscarriage D&C    . NASAL SINUS SURGERY     x2  . NECK SURGERY    . pain stimulator in back    . TONSILLECTOMY      There were no vitals filed for this visit.   Subjective Assessment - 04/27/20 1106    Subjective Patient reports doing well.  Had a migraine yesterday. Patient reports some general fatigue.    Patient is accompained by: Family member   Daughter Sales promotion account executive)   Pertinent History Diabetes, HTN, Hyperlipidemia, Chronic Neuropathy, IgG Kappa Smoldering Myeloma, Vertigo, Depression, Obesity    How long can you walk comfortably? <20 minutes    Patient Stated Goals Be able to walk without feeling like going to fall over    Currently in Pain? Yes    Pain Score --   4.5 on R Foot, 5.5 on L Foot   Pain Location Foot    Pain Orientation Right;Left    Pain Descriptors / Indicators Aching    Pain Type Chronic pain    Pain Onset More than a month ago    Pain Onset More than a month ago                             [facility] Adult PT Treatment/Exercise - 04/27/20 0001      Transfers   Transfers Sit to Stand;Stand to Sit  Sit to Stand 5: Supervision    Stand to Sit 5: Supervision    Comments completed sit <> stands x 10 reps with yellow theraband at thighs with focus on isometric hip abduction with completion.       Ambulation/Gait   Ambulation/Gait Yes    Ambulation/Gait Assistance 5: Supervision    Ambulation/Gait Assistance Details throughout therapy gym with activities    Assistive device None    Gait Pattern Step-through pattern;Decreased step length - right;Decreased step length - left;Wide base of support    Ambulation Surface Level;Indoor      Neuro Re-ed    Neuro Re-ed Details  At counter: completed alternating toe taps without UE support to bottom shelf 2 x 10 reps. PT educating on proper completion at home and added to HEP.       Exercises   Exercises Other Exercises;Knee/Hip    Other Exercises  Completed review of HEP addition as patient reports unable to complete due to step being outside and being to hot outside for completion. PT educating to still complete as able on cooler evenings/early in the mornings if possible. Added exercises to HEP for further BLE strengthening      Knee/Hip  Exercises: Standing   Lateral Step Up Both;1 set;10 reps;Hand Hold: 2;Step Height: 6";Limitations    Lateral Step Up Limitations in // bars: completed lateral step ups to R/L to 6 inch step. PT providing verbal cues for foot position. patietn report some dizziness at end of completion, but resolved quickly.     Forward Step Up Both;1 set;10 reps;Hand Hold: 2;Step Height: 6";Limitations    Forward Step Up Limitations completed with BUE support in // bars.     Other Standing Knee Exercises Compelted side stepping x 3 laps, down and back in // bars with yellow tband at thighs. Verbal cues for technique with completion.                   PT Education - 04/27/20 1309    Education Details HEP update    Person(s) Educated Patient    Methods Explanation;Demonstration;Handout    Comprehension Returned demonstration;Verbalized understanding;Need further instruction            PT Short Term Goals - 04/17/20 1111      PT SHORT TERM GOAL #1   Title Patient will be independent with initial balance/vestibular HEP    Baseline Patient reports independence with HEP    Time 3    Period Weeks    Status Achieved    Target Date 04/14/20      PT SHORT TERM GOAL #2   Title Patient will demo ability to complete situation 1 and 2 of M-CTSIB for 30 seconds to demonstrate improved balance    Baseline Patient able to demo situation 1 and 2 for full 30 seconds    Time 3    Period Weeks    Status Achieved    Target Date 04/14/20      PT SHORT TERM GOAL #3   Title Berg Balance Test to be assessed and LTG to be set as appropriate    Baseline Berg Balance and LTG set on 03/26/20    Time 3    Period Weeks    Status Achieved    Target Date 04/14/20             PT Long Term Goals - 03/26/20 1542      PT LONG TERM GOAL #1   Title Patient will be  independent with final balance/vestibular HEP (ALL LTG's due 05/05/20)    Baseline HEP initiated    Time 6    Period Weeks    Status New      PT  LONG TERM GOAL #2   Title Patient will demonstrate ability to hold situation 1-3 of M-CSTIB for 30 seconds, and situation 4 for >/= 15 seconds to demonstrate improved balance    Time 6    Period Weeks    Status New      PT LONG TERM GOAL #3   Title Patient will demonstrate ability to ambulate >300 ft w/ LRAD, Mod I, with no imbalance noted for improved community ambulation    Time 6    Period Weeks    Status New      PT LONG TERM GOAL #4   Title Patient will verbalize understanding of fall prevention strategies within the home to reduce risk for falls.    Time 6    Period Weeks    Status New      PT LONG TERM GOAL #5   Title Patient will increase Berg Balance Score by 5 points from baseline to demonstrate improved balance.    Baseline Baseline 40/56    Time 6    Period Weeks    Status New                 Plan - 04/27/20 1311    Clinical Impression Statement Reviewed HEP addition, and progressed BLE strenghtening exercises as tolerated by patient. Added standing BLE strenghtening exercises to HEP to further promote functional improvements. Patient will continue to benefit from skilled PT services to progress toward goals.    Personal Factors and Comorbidities Comorbidity 3+    Comorbidities Diabetes, HTN, Hyperlipidemia, Chronic Neuropathy, IgG Kappa Smoldering Myeloma, Vertigo, Depression, Obesity    Examination-Activity Limitations Squat;Stand    Examination-Participation Restrictions Driving;Community Activity;Shop    Stability/Clinical Decision Making Evolving/Moderate complexity    Rehab Potential Good    PT Frequency 2x / week    PT Duration 6 weeks    PT Treatment/Interventions ADLs/Self Care Home Management;Canalith Repostioning;Electrical Stimulation;DME Instruction;Gait training;Stair training;Functional mobility training;Therapeutic activities;Therapeutic exercise;Balance training;Neuromuscular re-education;Patient/family education;Manual techniques;Passive range  of motion;Vestibular    PT Next Visit Plan How was HEP additions?; Continue standing balance (foam/narrow BOS/head turns). Activites to promote SLS. High Level Balance activites.  Gait training. Focus on activites challenge vestibular system    Consulted and Agree with Plan of Care Patient;Family member/caregiver    Family Member Consulted Daughter Nira Conn)           Patient will benefit from skilled therapeutic intervention in order to improve the following deficits and impairments:  Abnormal gait, Decreased balance, Decreased endurance, Difficulty walking, Impaired sensation, Dizziness, Decreased activity tolerance, Decreased strength, Pain, Postural dysfunction  Visit Diagnosis: Unsteadiness on feet  Frequent falls  Other abnormalities of gait and mobility  Dizziness and giddiness     Problem List Patient Active Problem List   Diagnosis Date Noted  . Iron deficiency anemia 10/09/2019  . Impaired mobility and activities of daily living 08/30/2018  . Multiple myeloma ([place]) 08/30/2018  . S/P spinal fusion 08/30/2018  . Lumbar degenerative disc disease 08/27/2018  . History of degenerative disc disease 08/15/2018  . Neck pain 08/15/2018  . Sensorineural hearing loss (SNHL), bilateral 08/15/2018  . Tinnitus of both ears 08/15/2018  . Smoldering multiple myeloma ([place]) 12/04/2017  . Breakdown (mechanical) of implanted electronic neurostimulator, generator, initial encounter ([place]) 08/12/2016  .  Myalgia 08/12/2016  . Post laminectomy syndrome 08/12/2016  . Migraine with aura and without status migrainosus, not intractable 10/15/2015  . Intrinsic asthma 01/11/2010  . Dyspnea 12/14/2009  . DIABETES MELLITUS, TYPE II 12/11/2009  . Hyperlipidemia 12/11/2009  . MIGRAINE HEADACHE 12/11/2009  . Hereditary and idiopathic peripheral neuropathy 12/11/2009  . Hypertension 12/11/2009  . Insomnia 12/11/2009  . EDEMA 12/11/2009  . Type 2 diabetes mellitus with diabetic polyneuropathy  ([place]) 12/11/2009    Jones Bales, PT, DPT 04/27/2020, 1:12 PM  [facility] [address] [place], [place], [address] Phone: [phone]   Fax:  [phone]  Name: RIKAYLA DEMMON MRN: [id number] Date of Birth: Feb 08, 1951

## 2020-04-27 NOTE — Patient Instructions (Signed)
Access Code: [email or web address] URL: [email or web address] Date: 04/27/2020 Prepared by: Baldomero Lamy  Exercises Romberg Stance with Eyes Closed - 1 x daily - 5 x weekly - 1 sets - 3 reps - 30 hold Romberg Stance with Head Nods - 1 x daily - 5 x weekly - 2 sets - 10 reps Tandem Stance - 1 x daily - 5 x weekly - 1 sets - 3 reps - 15-30 hold Single Leg Stance with Support - 1 x daily - 5 x weekly - 1 sets - 3 reps - 30 hold Tandem Walking - 1 x daily - 5 x weekly - 3 sets - 10 reps Seated Gaze Stabilization with Head Rotation - 2 x daily - 5 x weekly - 2 sets - complete for 1 minute hold Runner's Step Up/Down - 1 x daily - 5 x weekly - 2 sets - 5 reps Standing Toe Taps - 1 x daily - 5 x weekly - 3 sets - 10 reps Side Stepping with Resistance at Thighs and Counter Support - 1 x daily - 5 x weekly - 3 sets - 10 reps Standing March with Counter Support - 1 x daily - 7 x weekly - 3 sets - 10 reps

## 2020-04-30 ENCOUNTER — Ambulatory Visit: Payer: BC Managed Care – PPO

## 2020-05-05 ENCOUNTER — Ambulatory Visit: Payer: BC Managed Care – PPO

## 2020-05-05 ENCOUNTER — Other Ambulatory Visit: Payer: Self-pay

## 2020-05-05 VITALS — BP 128/72

## 2020-05-05 DIAGNOSIS — R296 Repeated falls: Secondary | ICD-10-CM | POA: Diagnosis not present

## 2020-05-05 DIAGNOSIS — R2689 Other abnormalities of gait and mobility: Secondary | ICD-10-CM

## 2020-05-05 DIAGNOSIS — R2681 Unsteadiness on feet: Secondary | ICD-10-CM | POA: Diagnosis not present

## 2020-05-05 DIAGNOSIS — R42 Dizziness and giddiness: Secondary | ICD-10-CM

## 2020-05-05 NOTE — Therapy (Addendum)
[facility] [address] [place], [place], [address] Phone: [phone]   Fax:  [phone]  Physical Therapy Treatment/Re-Cert  Patient Details  Name: [name] MRN: [id number] Date of Birth: 1951-02-16 Referring Provider (PT): Carol Ada, MD   Encounter Date: 05/05/2020   PT End of Session - 05/05/20 1432    Visit Number 9    Number of Visits 13    Date for PT Re-Evaluation 07/04/20   POC for 4 weeks; Cert for 60 days   Authorization Type BCBS    PT Start Time 1103    PT Stop Time 1145    PT Time Calculation (min) 42 min    Equipment Utilized During Treatment Gait belt    Activity Tolerance Patient tolerated treatment well    Behavior During Therapy [facility] for tasks assessed/performed           Past Medical History:  Diagnosis Date  . Allergies   . Asthma    treated by pulmonologist   . Atherosclerosis of aorta ([place])    noted on xray  . Chronic pain   . Diabetes ([place])    type II  . Edema   . High cholesterol   . History of nuclear stress test 11/12/2009   normal   . Hypertension   . IgG monoclonal gammopathy of uncertain significance    igG kappa monoclonal gammopathy of unkown significance (per notes from [facility]).  . Insomnia   . Low back pain   . Migraine headache   . Neuropathy associated with MGUS ([place])   . Peripheral neuropathy   . Smoldering multiple myeloma ([place]) 12/04/2017    Past Surgical History:  Procedure Laterality Date  . BACK SURGERY     T10-S1 Fusion Dr. Patrice Paradise  . BACK SURGERY     2 fusion in lower back (L4-S1) and neck  . breast cyst removal age 69    . CESAREAN SECTION     x2  . hernia repair at 33 months old    . miscarriage D&C    . NASAL SINUS SURGERY     x2  . NECK SURGERY    . pain stimulator in back    . TONSILLECTOMY      Vitals:   05/05/20 1115  BP: 128/72     Subjective Assessment - 05/05/20 1105    Subjective Patient reports that she  had to miss last appointment due to migraine. Patient reports having migraine 1-2x/week. Reports she is not sleeping well.    Patient is accompained by: Family member   Daughter Sales promotion account executive)   Pertinent History Diabetes, HTN, Hyperlipidemia, Chronic Neuropathy, IgG Kappa Smoldering Myeloma, Vertigo, Depression, Obesity    How long can you walk comfortably? <20 minutes    Patient Stated Goals Be able to walk without feeling like going to fall over    Currently in Pain? Yes    Pain Score 2     Pain Location Back    Pain Orientation Lower    Pain Descriptors / Indicators Aching    Pain Type Chronic pain    Pain Onset More than a month ago    Pain Score 6   6 1/2   Pain Location Foot    Pain Orientation Right;Left    Pain Descriptors / Indicators Burning    Pain Type Chronic pain    Pain Onset More than a month ago  [facility] Adult PT Treatment/Exercise - 05/05/20 0001      Transfers   Transfers Sit to Stand;Stand to Sit    Sit to Stand 6: Modified independent (Device/Increase time)    Stand to Sit 6: Modified independent (Device/Increase time)      Ambulation/Gait   Ambulation/Gait Yes    Ambulation/Gait Assistance 6: Modified independent (Device/Increase time)    Ambulation/Gait Assistance Details gait training on indoor level surfaces    Ambulation Distance (Feet) 600 Feet    Assistive device None    Gait Pattern Step-through pattern;Decreased step length - right;Decreased step length - left;Wide base of support    Ambulation Surface Level;Indoor      Standardized Balance Assessment   Standardized Balance Assessment Berg Balance Test      Berg Balance Test   Sit to Stand Able to stand without using hands and stabilize independently    Standing Unsupported Able to stand safely 2 minutes    Sitting with Back Unsupported but Feet Supported on Floor or Stool Able to sit safely and securely 2 minutes    Stand to Sit Sits safely with minimal  use of hands    Transfers Able to transfer safely, minor use of hands    Standing Unsupported with Eyes Closed Able to stand 10 seconds safely    Standing Ubsupported with Feet Together Able to place feet together independently and stand 1 minute safely    From Standing, Reach Forward with Outstretched Arm Can reach confidently >25 cm (10")    From Standing Position, Pick up Object from Floor Able to pick up shoe, needs supervision    From Standing Position, Turn to Look Behind Over each Shoulder Looks behind one side only/other side shows less weight shift    Turn 360 Degrees Able to turn 360 degrees safely one side only in 4 seconds or less    Standing Unsupported, Alternately Place Feet on Step/Stool Able to stand independently and safely and complete 8 steps in 20 seconds    Standing Unsupported, One Foot in Front Able to plae foot ahead of the other independently and hold 30 seconds    Standing on One Leg Able to lift leg independently and hold equal to or more than 3 seconds    Total Score 50      Neuro Re-ed    Neuro Re-ed Details  Completed M-CSTIB: patient able to hold situation 1-3 for full 30 seconds with no imbalance, patietn able to hold situation 4 on avg: 16.75 seconds.       Exercises   Exercises Other Exercises    Other Exercises  Patient reports HEP is going well and feels comfort/independent with all exercises. Just needs to complete more frequently, as only completing 3x/week currently.                   PT Education - 05/05/20 1432    Education Details Educated on progress toward LTG    Person(s) Educated Patient;Child(ren)    Methods Explanation    Comprehension Verbalized understanding            PT Short Term Goals - 04/17/20 1111      PT SHORT TERM GOAL #1   Title Patient will be independent with initial balance/vestibular HEP    Baseline Patient reports independence with HEP    Time 3    Period Weeks    Status Achieved    Target Date 04/14/20       PT SHORT TERM  GOAL #2   Title Patient will demo ability to complete situation 1 and 2 of M-CTSIB for 30 seconds to demonstrate improved balance    Baseline Patient able to demo situation 1 and 2 for full 30 seconds    Time 3    Period Weeks    Status Achieved    Target Date 04/14/20      PT SHORT TERM GOAL #3   Title Berg Balance Test to be assessed and LTG to be set as appropriate    Baseline Berg Balance and LTG set on 03/26/20    Time 3    Period Weeks    Status Achieved    Target Date 04/14/20             PT Long Term Goals - 05/05/20 1124      PT LONG TERM GOAL #1   Title Patient will be independent with final balance/vestibular HEP (ALL LTG's due 05/05/20)    Baseline Independence with HEP; reports doing 3x/weekly    Time 6    Period Weeks    Status Partially Met      PT LONG TERM GOAL #2   Title Patient will demonstrate ability to hold situation 1-3 of M-CSTIB for 30 seconds, and situation 4 for >/= 15 seconds to demonstrate improved balance    Baseline 1-3 Full 30 secs, Sit 4: 16.75    Time 6    Period Weeks    Status Achieved      PT LONG TERM GOAL #3   Title Patient will demonstrate ability to ambulate >300 ft w/ LRAD, Mod I, with no imbalance noted for improved community ambulation    Baseline patient able to ambulate 600 ft indoor Mod I without AD with no instances of imbalance    Time 6    Period Weeks    Status Achieved      PT LONG TERM GOAL #4   Title Patient will verbalize understanding of fall prevention strategies within the home to reduce risk for falls.    Baseline dependent unable to complete due to time constraints today    Time 6    Period Weeks    Status On-going      PT LONG TERM GOAL #5   Title Patient will increase Berg Balance Score by 5 points from baseline to demonstrate improved balance.    Baseline Baseline 40/56, 50/56 on 8/24    Time 6    Period Weeks    Status Achieved               PT Short Term Goals - 05/05/20  1438      PT SHORT TERM GOAL #1   Title STG = LTGs           PT Long Term Goals - 05/05/20 1124      PT LONG TERM GOAL #1   Title Patient will be independent with final balance/vestibular HEP and completing at least 5x/week (All LTG due: 06/02/20)    Baseline Independence with HEP; reports doing 3x/weekly    Time 4    Period Weeks    Status Revised    Target Date 06/02/20      PT LONG TERM GOAL #2   Title Patient demo ability to ambulate x 400 ft with negotiating over obstalces and demo no instances of imbalance    Baseline unsteadinsss/imbalance with stepping over obstacles    Time 4    Period Weeks    Status New  Target Date 06/02/20      PT LONG TERM GOAL #3   Title Patient will improve Berg Balance to >/= 52/56 to demo improved balance and reduced fall risk    Baseline 50/56    Time 4    Period Weeks    Status New      PT LONG TERM GOAL #4   Title Patient will verbalize understanding of fall prevention strategies within the home to reduce risk for falls.    Baseline dependent unable to complete due to time constraints today    Time 6    Period Weeks    Status On-going      PT LONG TERM GOAL #5   Title --    Baseline --    Time --    Period --    Status --               Plan - 05/05/20 1434    Clinical Impression Statement Today's skilled PT session included assessment of patient's progress toward all LTG's. Patient able to meet Berg Balance and M-CTSIB goal demonstrating improved balance. Patient currently only completing HEP 3x/week at this time. Patient will continue to further progress toward all goals and maximize functional mobility.    Personal Factors and Comorbidities Comorbidity 3+    Comorbidities Diabetes, HTN, Hyperlipidemia, Chronic Neuropathy, IgG Kappa Smoldering Myeloma, Vertigo, Depression, Obesity    Examination-Activity Limitations Squat;Stand    Examination-Participation Restrictions Driving;Community Activity;Shop     Stability/Clinical Decision Making Evolving/Moderate complexity    Rehab Potential Good    PT Frequency 1x / week    PT Duration 4 weeks      PT Treatment/Interventions ADLs/Self Care Home Management;Canalith Repostioning;Electrical Stimulation;DME Instruction;Gait training;Stair training;Functional mobility training;Therapeutic activities;Therapeutic exercise;Balance training;Neuromuscular re-education;Patient/family education;Manual techniques;Passive range of motion;Vestibular    PT Next Visit Plan How was HEP additions?; Continue standing balance (foam/narrow BOS/head turns). Activites to promote SLS. High Level Balance activites.  Gait training. Focus on activites challenge vestibular system    Consulted and Agree with Plan of Care Patient;Family member/caregiver    Family Member Consulted Daughter Nira Conn)           Patient will benefit from skilled therapeutic intervention in order to improve the following deficits and impairments:  Abnormal gait, Decreased balance, Decreased endurance, Difficulty walking, Impaired sensation, Dizziness, Decreased activity tolerance, Decreased strength, Pain, Postural dysfunction  Visit Diagnosis: Unsteadiness on feet  Frequent falls  Other abnormalities of gait and mobility  Dizziness and giddiness     Problem List Patient Active Problem List   Diagnosis Date Noted  . Iron deficiency anemia 10/09/2019  . Impaired mobility and activities of daily living 08/30/2018  . Multiple myeloma ([place]) 08/30/2018  . S/P spinal fusion 08/30/2018  . Lumbar degenerative disc disease 08/27/2018  . History of degenerative disc disease 08/15/2018  . Neck pain 08/15/2018  . Sensorineural hearing loss (SNHL), bilateral 08/15/2018  . Tinnitus of both ears 08/15/2018  . Smoldering multiple myeloma ([place]) 12/04/2017  . Breakdown (mechanical) of implanted electronic neurostimulator, generator, initial encounter ([place]) 08/12/2016  . Myalgia 08/12/2016  . Post  laminectomy syndrome 08/12/2016  . Migraine with aura and without status migrainosus, not intractable 10/15/2015  . Intrinsic asthma 01/11/2010  . Dyspnea 12/14/2009  . DIABETES MELLITUS, TYPE II 12/11/2009  . Hyperlipidemia 12/11/2009  . MIGRAINE HEADACHE 12/11/2009  . Hereditary and idiopathic peripheral neuropathy 12/11/2009  . Hypertension 12/11/2009  . Insomnia 12/11/2009  . EDEMA 12/11/2009  . Type 2 diabetes mellitus  with diabetic polyneuropathy ([place]) 12/11/2009    Jones Bales, PT, DPT 05/05/2020, 2:37 PM  [facility] [address] [place], [place], [address] Phone: [phone]   Fax:  [phone]  Name: ESTRELLA ALCARAZ MRN: [id number] Date of Birth: 12/27/1950

## 2020-05-07 ENCOUNTER — Ambulatory Visit: Payer: BC Managed Care – PPO

## 2020-05-20 ENCOUNTER — Encounter: Payer: Self-pay | Admitting: Obstetrics and Gynecology

## 2020-05-21 ENCOUNTER — Other Ambulatory Visit: Payer: Self-pay

## 2020-05-21 ENCOUNTER — Ambulatory Visit: Payer: BC Managed Care – PPO | Attending: Family Medicine

## 2020-05-21 DIAGNOSIS — R2681 Unsteadiness on feet: Secondary | ICD-10-CM | POA: Diagnosis not present

## 2020-05-21 DIAGNOSIS — R42 Dizziness and giddiness: Secondary | ICD-10-CM | POA: Diagnosis not present

## 2020-05-21 DIAGNOSIS — R2689 Other abnormalities of gait and mobility: Secondary | ICD-10-CM | POA: Insufficient documentation

## 2020-05-21 DIAGNOSIS — R296 Repeated falls: Secondary | ICD-10-CM | POA: Insufficient documentation

## 2020-05-21 NOTE — Therapy (Signed)
[facility] [address] [place], [place], [address] Phone: [phone]   Fax:  [phone]  Physical Therapy Treatment  Patient Details  Name: [name] MRN: [id number] Date of Birth: 24-Oct-1950 Referring Provider (PT): Carol Ada, MD   Encounter Date: 05/21/2020   PT End of Session - 05/21/20 1753    Visit Number 10    Number of Visits 13    Date for PT Re-Evaluation 07/04/20   POC for 4 weeks; Cert for 60 days   Authorization Type BCBS    PT Start Time 1747    PT Stop Time 1830    PT Time Calculation (min) 43 min    Equipment Utilized During Treatment Gait belt    Activity Tolerance Patient tolerated treatment well    Behavior During Therapy [facility] for tasks assessed/performed           Past Medical History:  Diagnosis Date  . Allergies   . Asthma    treated by pulmonologist   . Atherosclerosis of aorta ([place])    noted on xray  . Chronic pain   . Diabetes ([place])    type II  . Edema   . High cholesterol   . History of nuclear stress test 11/12/2009   normal   . Hypertension   . IgG monoclonal gammopathy of uncertain significance    igG kappa monoclonal gammopathy of unkown significance (per notes from [employer]).  . Insomnia   . Low back pain   . Migraine headache   . Neuropathy associated with MGUS ([place])   . Peripheral neuropathy   . 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 BACK SURGERY     T10-S1 Fusion Dr. Patrice Paradise  . BACK SURGERY     2 fusion in lower back (L4-S1) and neck  . breast cyst removal age 41    . CESAREAN SECTION     x2  . hernia repair at 75 months old    . miscarriage D&C    . NASAL SINUS SURGERY     x2  . NECK SURGERY    . pain stimulator in back    . TONSILLECTOMY      There were no vitals filed for this visit.   Subjective Assessment - 05/21/20 1750    Subjective Patient reports that had  migraine this morning but has went away. Patient almost lost balance this morning but was able to catch herself.    Patient is accompained by: Family member   Daughter Sales promotion account executive)   Pertinent History Diabetes, HTN, Hyperlipidemia, Chronic Neuropathy, IgG Kappa Smoldering Myeloma, Vertigo, Depression, Obesity    How long can you walk comfortably? <20 minutes    Patient Stated Goals Be able to walk without feeling like going to fall over    Currently in Pain? Yes    Pain Score 7     Pain Location Foot    Pain Orientation Right;Left    Pain Descriptors / Indicators Sharp;Burning    Pain Type Chronic pain    Pain Onset More than a month ago                             [facility] Adult PT Treatment/Exercise - 05/21/20 0001      Ambulation/Gait   Ambulation/Gait Yes    Ambulation/Gait Assistance 6: Modified independent (Device/Increase time)    Ambulation/Gait Assistance  Details gait with activites throughout session, see neuro re-ed for details    Assistive device None    Gait Pattern Step-through pattern;Decreased step length - right;Decreased step length - left;Wide base of support    Ambulation Surface Level;Indoor    Gait Comments intermittent rest breaks needed throughout exercises due to fatigue.       High Level Balance   High Level Balance Activities Negotiating over obstacles    High Level Balance Comments Completed ambulation x 35' with negotiating over obstacles (including orange hurdles), completed x 4 laps. Pt demo wanting to step over hurdles with RLE consistently often taken shuffled steps to ensure she can. PT educating on working to step over with which foot comes natural to further improve balance. Increased difficulty with stance on RLE and stepping over obstacle with LLE. CGA required throughout. PT providing verbal cues to avoid circumduction with BLE, and improved hip/knee flexion for clearance.       Neuro Re-ed    Neuro Re-ed Details  Standing at  countertop, completed forward and backward stepping over black beam to promote improved foot clearance and SLS activity. Completed alteranting feet x 15 reps bilaterally on firm surface, progressed to completing on blue mat x 15 reps bilaterally. 1-2 instances of increased balance challenge requiring CGA from PT to maintain balance as well as UE support from countertop. Completed obstacle course, including toe tap to cone followed by step over and tandem walking across blue mat, completed x 5 reps down and back approx 35'. CGA required throughout, increased difficulty noted with toe tap and continued to step over as patient demo decreased SLS time. Completed further balance activites with ambulation including walking with ball toss to self 4 x 30', progressed to include cogntiive challenge of naming fruits/veggies/states and counting backwards by 4 from 100 to further add in dual tasking to ambulation. Also completed visual tracking with ambulation including clockwise/counterclockwise circles, 2  x 35' each, as well as diagonals 2 x 35' alternating direction between reps. Increased difficulty noted with self ball toss and dual tasking challenge, require CGA at times to  maintain balance. Patient demo slowed gait speed to allow for completion of task with ambulation.                     PT Short Term Goals - 05/05/20 1438      PT SHORT TERM GOAL #1   Title STG = LTGs             PT Long Term Goals - 05/05/20 1124      PT LONG TERM GOAL #1   Title Patient will be independent with final balance/vestibular HEP and completing at least 5x/week (All LTG due: 06/02/20)    Baseline Independence with HEP; reports doing 3x/weekly    Time 4    Period Weeks    Status Revised    Target Date 06/02/20      PT LONG TERM GOAL #2   Title Patient demo ability to ambulate x 400 ft with negotiating over obstalces and demo no instances of imbalance    Baseline unsteadinsss/imbalance with stepping over  obstacles    Time 4    Period Weeks    Status New    Target Date 06/02/20      PT LONG TERM GOAL #3   Title Patient will improve Berg Balance to >/= 52/56 to demo improved balance and reduced fall risk    Baseline 50/56    Time  4    Period Weeks    Status New      PT LONG TERM GOAL #4   Title Patient will verbalize understanding of fall prevention strategies within the home to reduce risk for falls.    Baseline dependent unable to complete due to time constraints today    Time 6    Period Weeks    Status On-going      PT LONG TERM GOAL #5   Title --    Baseline --    Time --    Period --    Status --                 Plan - 05/21/20 1929    Clinical Impression Statement Today's skilled PT session included continued completion of dynamic gait activites to further challenge balance. Increased challenge noted with obstacle course and activites promoting SLS. CGA required at times to help maintain balance, intermittent rest breaks needed. Will continue to progress toward goals.    Personal Factors and Comorbidities Comorbidity 3+    Comorbidities Diabetes, HTN, Hyperlipidemia, Chronic Neuropathy, IgG Kappa Smoldering Myeloma, Vertigo, Depression, Obesity    Examination-Activity Limitations Squat;Stand    Examination-Participation Restrictions Driving;Community Activity;Shop    Stability/Clinical Decision Making Evolving/Moderate complexity    Rehab Potential Good    PT Frequency 1x / week    PT Duration 4 weeks    PT Treatment/Interventions ADLs/Self Care Home Management;Canalith Repostioning;Electrical Stimulation;DME Instruction;Gait training;Stair training;Functional mobility training;Therapeutic activities;Therapeutic exercise;Balance training;Neuromuscular re-education;Patient/family education;Manual techniques;Passive range of motion;Vestibular    PT Next Visit Plan Educated on Fall Prevention.  Continue standing balance (foam/narrow BOS/head turns). Activites to  promote SLS. High Level Balance activites.  Gait training. Focus on activites challenge vestibular system    Consulted and Agree with Plan of Care Patient;Family member/caregiver    Family Member Consulted Daughter Nira Conn)           Patient will benefit from skilled therapeutic intervention in order to improve the following deficits and impairments:  Abnormal gait, Decreased balance, Decreased endurance, Difficulty walking, Impaired sensation, Dizziness, Decreased activity tolerance, Decreased strength, Pain, Postural dysfunction  Visit Diagnosis: Unsteadiness on feet  Frequent falls  Other abnormalities of gait and mobility  Dizziness and giddiness     Problem List Patient Active Problem List   Diagnosis Date Noted  . Iron deficiency anemia 10/09/2019  . Impaired mobility and activities of daily living 08/30/2018  . Multiple myeloma ([place]) 08/30/2018  . S/P spinal fusion 08/30/2018  . Lumbar degenerative disc disease 08/27/2018  . History of degenerative disc disease 08/15/2018  . Neck pain 08/15/2018  . Sensorineural hearing loss (SNHL), bilateral 08/15/2018  . Tinnitus of both ears 08/15/2018  . 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 Breakdown (mechanical) of implanted electronic neurostimulator, generator, initial encounter ([place]) 08/12/2016  . Myalgia 08/12/2016  . Post laminectomy syndrome 08/12/2016  . Migraine with aura and without status migrainosus, not intractable 10/15/2015  . Intrinsic asthma 01/11/2010  . Dyspnea 12/14/2009  . DIABETES MELLITUS, TYPE II 12/11/2009  . Hyperlipidemia 12/11/2009  . MIGRAINE HEADACHE 12/11/2009  . Hereditary and idiopathic peripheral neuropathy 12/11/2009  . Hypertension 12/11/2009  . Insomnia 12/11/2009  . EDEMA 12/11/2009  . Type 2 diabetes mellitus with diabetic polyneuropathy ([place]) 12/11/2009    Jones Bales, PT, DPT 05/21/2020, 7:31 PM  [facility] [address] [place], [place], [address] Phone: [phone]   Fax:  [phone]  Name: Lottie DARRIONA DEHAAS  MRN: [id number] Date of Birth: September 25, 1950

## 2020-05-29 ENCOUNTER — Ambulatory Visit: Payer: BC Managed Care – PPO

## 2020-05-29 ENCOUNTER — Other Ambulatory Visit: Payer: Self-pay

## 2020-05-29 DIAGNOSIS — R2689 Other abnormalities of gait and mobility: Secondary | ICD-10-CM

## 2020-05-29 DIAGNOSIS — R2681 Unsteadiness on feet: Secondary | ICD-10-CM

## 2020-05-29 DIAGNOSIS — R296 Repeated falls: Secondary | ICD-10-CM

## 2020-05-29 DIAGNOSIS — R42 Dizziness and giddiness: Secondary | ICD-10-CM

## 2020-05-29 NOTE — Therapy (Signed)
[facility] [address] [place], [place], [address] Phone: [phone]   Fax:  [phone]  Physical Therapy Treatment  Patient Details  Name: [name] MRN: [id number] Date of Birth: Feb 20, 1951 Referring Provider (PT): Carol Ada, MD   Encounter Date: 05/29/2020   PT End of Session - 05/29/20 1149    Visit Number 11    Number of Visits 13    Date for PT Re-Evaluation 07/04/20   POC for 4 weeks; Cert for 60 days   Authorization Type BCBS    PT Start Time 1148    PT Stop Time 1230    PT Time Calculation (min) 42 min    Equipment Utilized During Treatment Gait belt    Activity Tolerance Patient tolerated treatment well    Behavior During Therapy WFL for tasks assessed/performed           Past Medical History:  Diagnosis Date  . Allergies   . Asthma    treated by pulmonologist   . Atherosclerosis of aorta ([place])    noted on xray  . Chronic pain   . Diabetes ([place])    type II  . Edema   . High cholesterol   . History of nuclear stress test 11/12/2009   normal   . Hypertension   . IgG monoclonal gammopathy of uncertain significance    igG kappa monoclonal gammopathy of unkown significance (per notes from [employer]).  . Insomnia   . Low back pain   . Migraine headache   . Neuropathy associated with MGUS ([place])   . Peripheral neuropathy   . Smoldering multiple myeloma ([place]) 12/04/2017    Past Surgical History:  Procedure Laterality Date  . BACK SURGERY     T10-S1 Fusion Dr. Patrice Paradise  . BACK SURGERY     2 fusion in lower back (L4-S1) and neck  . breast cyst removal age 50    . CESAREAN SECTION     x2  . hernia repair at 24 months old    . miscarriage D&C    . NASAL SINUS SURGERY     x2  . NECK SURGERY    . pain stimulator in back    . TONSILLECTOMY      There were no vitals filed for this visit.   Subjective Assessment - 05/29/20 1150    Subjective Patient reports that feels  fatigue and has pain in the feet today. Patient and daughter report had a stumble the other day but was able to catch her balance and keep from falling.    Patient is accompained by: Family member   Daughter Sales promotion account executive)   Pertinent History Diabetes, HTN, Hyperlipidemia, Chronic Neuropathy, IgG Kappa Smoldering Myeloma, Vertigo, Depression, Obesity    How long can you walk comfortably? <20 minutes    Patient Stated Goals Be able to walk without feeling like going to fall over    Currently in Pain? Yes    Pain Score 7     Pain Location Foot    Pain Orientation Right;Left    Pain Descriptors / Indicators Aching    Pain Type Chronic pain    Pain Onset More than a month ago                             [facility] Adult PT Treatment/Exercise - 05/29/20 0001      Ambulation/Gait   Ambulation/Gait Yes    Ambulation/Gait Assistance  6: Modified independent (Device/Increase time)    Ambulation/Gait Assistance Details gait with activites, intermittent CGA required for activites with increased balance challenge    Assistive device None    Gait Pattern Step-through pattern;Decreased step length - right;Decreased step length - left;Wide base of support    Ambulation Surface Level;Indoor    Stairs Yes    Stairs Assistance 5: Supervision    Stairs Assistance Details (indicate cue type and reason) completed ascend/descend stairs with verbal cues to push through BLE versus pulling on rail to promote improved completion.     Stair Management Technique Two rails;Step to pattern;Forwards    Number of Stairs 8    Height of Stairs 6      High Level Balance   High Level Balance Activities Head turns;Backward walking   Gait with Eyes Closed   High Level Balance Comments Completed the following activities including walking with horizontal head turns 4 x 40', walking backwards with eyes open 3 x 40', and walking with vision removed 2x 40'. CGA required for walking with vision and with backwards  walking. slow cadence noted with backwards walking with verbal cues for improved step length.       Self-Care   Self-Care Other Self-Care Comments    Other Self-Care Comments  PT completed education and handout to reduce fall risk and for fall prvention strategies for home. Patient also reporting that she has been getting on average <4 hrs of sleep per night. PT continue to provide education on sleep hygiene and potential to be seen by PCP for this issue as well. PT educating that lack of sleep, decreased water intake, could lead to increased fatigue patient is experiencing as well as affect migraines.       Neuro Re-ed    Neuro Re-ed Details  Completed obstacle course including negotiating/stepping over orange hurdles (4 hurdles) followed by walking across blue mat with lateral toe tap to cone, completed x 3 reps, down and back. Increased CGA required for toe taps. Progressed to completing cone step over with toe tap to further promote SLS, followed by tandem walking across blue mat, completed x 4 laps, down and back. Increased CGA required throughout. Patient did have one instances of dizziness reports that seemed to be dysequilibium, able to maintain her balance during episode.                   PT Education - 05/29/20 1214    Education Details educated on fall prevention; seeing PCP for further evaluation regarding fatigue/sleep issues    Person(s) Educated Patient    Methods Explanation;Handout    Comprehension Verbalized understanding            PT Short Term Goals - 05/05/20 1438      PT SHORT TERM GOAL #1   Title STG = LTGs             PT Long Term Goals - 05/05/20 1124      PT LONG TERM GOAL #1   Title Patient will be independent with final balance/vestibular HEP and completing at least 5x/week (All LTG due: 06/02/20)    Baseline Independence with HEP; reports doing 3x/weekly    Time 4    Period Weeks    Status Revised    Target Date 06/02/20      PT LONG TERM  GOAL #2   Title Patient demo ability to ambulate x 400 ft with negotiating over obstalces and demo no instances of imbalance  Baseline unsteadinsss/imbalance with stepping over obstacles    Time 4    Period Weeks    Status New    Target Date 06/02/20      PT LONG TERM GOAL #3   Title Patient will improve Berg Balance to >/= 52/56 to demo improved balance and reduced fall risk    Baseline 50/56    Time 4    Period Weeks    Status New      PT LONG TERM GOAL #4   Title Patient will verbalize understanding of fall prevention strategies within the home to reduce risk for falls.    Baseline dependent unable to complete due to time constraints today    Time 6    Period Weeks    Status On-going      PT LONG TERM GOAL #5   Title --    Baseline --    Time --    Period --    Status --                 Plan - 05/29/20 1312    Clinical Impression Statement Today's skilled PT session included education on fall prevention strategies for home including handout. PT continued education on sleep hygiene due to continued reports of decreased sleep duration and low quality of sleep. Continued balance training activites to promote improved dynamic balance with ambulation. Will continue to progress toward goals.    Personal Factors and Comorbidities Comorbidity 3+    Comorbidities Diabetes, HTN, Hyperlipidemia, Chronic Neuropathy, IgG Kappa Smoldering Myeloma, Vertigo, Depression, Obesity    Examination-Activity Limitations Squat;Stand    Examination-Participation Restrictions Driving;Community Activity;Shop    Stability/Clinical Decision Making Evolving/Moderate complexity    Rehab Potential Good    PT Frequency 1x / week    PT Duration 4 weeks    PT Treatment/Interventions ADLs/Self Care Home Management;Canalith Repostioning;Electrical Stimulation;DME Instruction;Gait training;Stair training;Functional mobility training;Therapeutic activities;Therapeutic exercise;Balance  training;Neuromuscular re-education;Patient/family education;Manual techniques;Passive range of motion;Vestibular    PT Next Visit Plan Did we speak to Dr regarding sleep? how has sleep schedule been? Continue standing balance (foam/narrow BOS/head turns). Activites to promote SLS. High Level Balance activites.  Gait training. Focus on activites challenge vestibular system    Consulted and Agree with Plan of Care Patient;Family member/caregiver    Family Member Consulted Daughter Nira Conn)           Patient will benefit from skilled therapeutic intervention in order to improve the following deficits and impairments:  Abnormal gait, Decreased balance, Decreased endurance, Difficulty walking, Impaired sensation, Dizziness, Decreased activity tolerance, Decreased strength, Pain, Postural dysfunction  Visit Diagnosis: Unsteadiness on feet  Frequent falls  Other abnormalities of gait and mobility  Dizziness and giddiness     Problem List Patient Active Problem List   Diagnosis Date Noted  . Iron deficiency anemia 10/09/2019  . Impaired mobility and activities of daily living 08/30/2018  . Multiple myeloma ([place]) 08/30/2018  . S/P spinal fusion 08/30/2018  . Lumbar degenerative disc disease 08/27/2018  . History of degenerative disc disease 08/15/2018  . Neck pain 08/15/2018  . Sensorineural hearing loss (SNHL), bilateral 08/15/2018  . Tinnitus of both ears 08/15/2018  . 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 Breakdown (mechanical) of implanted electronic neurostimulator, generator, initial encounter ([place]) 08/12/2016  . Myalgia 08/12/2016  . Post laminectomy syndrome 08/12/2016  . Migraine with aura and without status migrainosus, not intractable 10/15/2015  . Intrinsic asthma 01/11/2010  . Dyspnea 12/14/2009  . DIABETES MELLITUS, TYPE II 12/11/2009  .  Hyperlipidemia 12/11/2009  . MIGRAINE HEADACHE 12/11/2009  . Hereditary and idiopathic peripheral neuropathy 12/11/2009   . Hypertension 12/11/2009  . Insomnia 12/11/2009  . EDEMA 12/11/2009  . Type 2 diabetes mellitus with diabetic polyneuropathy ([place]) 12/11/2009    Jones Bales, PT, DPT 05/29/2020, 1:19 PM  [facility] [address] [place], [place], [address] Phone: [phone]   Fax:  [phone]  Name: [name] MRN: [id number] Date of Birth: October 05, 1950

## 2020-05-29 NOTE — Patient Instructions (Signed)
Access Code: [email or web address] URL: [email or web address] Date: 05/29/2020 Prepared by: Baldomero Lamy  Exercises Romberg Stance with Eyes Closed - 1 x daily - 5 x weekly - 1 sets - 3 reps - 30 hold Romberg Stance with Head Nods - 1 x daily - 5 x weekly - 2 sets - 10 reps Tandem Stance - 1 x daily - 5 x weekly - 1 sets - 3 reps - 15-30 hold Single Leg Stance with Support - 1 x daily - 5 x weekly - 1 sets - 3 reps - 30 hold Tandem Walking - 1 x daily - 5 x weekly - 3 sets - 10 reps Seated Gaze Stabilization with Head Rotation - 2 x daily - 5 x weekly - 2 sets - complete for 1 minute hold Runner's Step Up/Down - 1 x daily - 5 x weekly - 2 sets - 5 reps Standing Toe Taps - 1 x daily - 5 x weekly - 3 sets - 10 reps Side Stepping with Resistance at Thighs and Counter Support - 1 x daily - 5 x weekly - 3 sets - 10 reps Standing March with Counter Support - 1 x daily - 7 x weekly - 3 sets - 10 reps  Patient Education Falls at Home Checklist

## 2020-06-01 DIAGNOSIS — E1142 Type 2 diabetes mellitus with diabetic polyneuropathy: Secondary | ICD-10-CM | POA: Diagnosis not present

## 2020-06-01 DIAGNOSIS — Z79891 Long term (current) use of opiate analgesic: Secondary | ICD-10-CM | POA: Diagnosis not present

## 2020-06-01 DIAGNOSIS — M961 Postlaminectomy syndrome, not elsewhere classified: Secondary | ICD-10-CM | POA: Diagnosis not present

## 2020-06-01 DIAGNOSIS — G894 Chronic pain syndrome: Secondary | ICD-10-CM | POA: Diagnosis not present

## 2020-06-03 DIAGNOSIS — E1151 Type 2 diabetes mellitus with diabetic peripheral angiopathy without gangrene: Secondary | ICD-10-CM | POA: Diagnosis not present

## 2020-06-03 DIAGNOSIS — L603 Nail dystrophy: Secondary | ICD-10-CM | POA: Diagnosis not present

## 2020-06-03 DIAGNOSIS — I739 Peripheral vascular disease, unspecified: Secondary | ICD-10-CM | POA: Diagnosis not present

## 2020-06-03 DIAGNOSIS — L84 Corns and callosities: Secondary | ICD-10-CM | POA: Diagnosis not present

## 2020-06-04 ENCOUNTER — Ambulatory Visit: Payer: BC Managed Care – PPO | Admitting: Physical Therapy

## 2020-06-11 DIAGNOSIS — M47816 Spondylosis without myelopathy or radiculopathy, lumbar region: Secondary | ICD-10-CM | POA: Diagnosis not present

## 2020-06-11 DIAGNOSIS — Z79891 Long term (current) use of opiate analgesic: Secondary | ICD-10-CM | POA: Diagnosis not present

## 2020-06-11 DIAGNOSIS — G894 Chronic pain syndrome: Secondary | ICD-10-CM | POA: Diagnosis not present

## 2020-06-16 ENCOUNTER — Ambulatory Visit: Payer: BC Managed Care – PPO

## 2020-07-02 NOTE — Therapy (Signed)
[facility] [address] [place], [place], [address] Phone: [phone]   Fax:  [phone]  Patient Details  Name: [name] MRN: [id number] Date of Birth: 04-08-1951 Referring Provider:  Carol Ada, MD  Encounter Date: 07/02/2020  PHYSICAL THERAPY DISCHARGE SUMMARY  Visits from Start of Care: 11  Current functional level related to goals / functional outcomes: See goals for details. Patient called [facility] [place] stating minimal improvements in migraines/headaches and would not be returning to PT services at this time.   Remaining deficits: Impaired Balance, Dizziness/Migraines   Education / Equipment: HEP Provided at Prior Session.   Plan: Patient agrees to discharge.  Patient goals were not met.                                                  not returning since the last visit.  ?????           PT Short Term Goals - 05/05/20 1438      PT SHORT TERM GOAL #1   Title STG = LTGs           PT Long Term Goals - 05/05/20 1124      PT LONG TERM GOAL #1   Title Patient will be independent with final balance/vestibular HEP and completing at least 5x/week (All LTG due: 06/02/20)    Baseline Independence with HEP; reports doing 3x/weekly    Time 4    Period Weeks    Status Revised    Target Date 06/02/20      PT LONG TERM GOAL #2   Title Patient demo ability to ambulate x 400 ft with negotiating over obstalces and demo no instances of imbalance    Baseline unsteadinsss/imbalance with stepping over obstacles    Time 4    Period Weeks    Status New    Target Date 06/02/20      PT LONG TERM GOAL #3   Title Patient will improve Berg Balance to >/= 52/56 to demo improved balance and reduced fall risk    Baseline 50/56    Time 4    Period Weeks    Status New      PT LONG TERM GOAL #4   Title Patient will verbalize understanding of fall prevention strategies within the home to reduce risk  for falls.    Baseline dependent unable to complete due to time constraints today    Time 6    Period Weeks    Status On-going      PT LONG TERM GOAL #5   Title --    Baseline --    Time --    Period --    Status --            Jones Bales, PT, DPT 07/02/2020, 7:34 PM  [facility] [address] [place], [place], [address] Phone: [phone]   Fax:  [phone]

## 2020-07-15 DIAGNOSIS — Z1231 Encounter for screening mammogram for malignant neoplasm of breast: Secondary | ICD-10-CM | POA: Diagnosis not present

## 2020-07-29 DIAGNOSIS — Z79891 Long term (current) use of opiate analgesic: Secondary | ICD-10-CM | POA: Diagnosis not present

## 2020-07-29 DIAGNOSIS — E1142 Type 2 diabetes mellitus with diabetic polyneuropathy: Secondary | ICD-10-CM | POA: Diagnosis not present

## 2020-07-29 DIAGNOSIS — G894 Chronic pain syndrome: Secondary | ICD-10-CM | POA: Diagnosis not present

## 2020-07-29 DIAGNOSIS — M961 Postlaminectomy syndrome, not elsewhere classified: Secondary | ICD-10-CM | POA: Diagnosis not present

## 2020-08-03 DIAGNOSIS — H35033 Hypertensive retinopathy, bilateral: Secondary | ICD-10-CM | POA: Diagnosis not present

## 2020-08-03 DIAGNOSIS — H35363 Drusen (degenerative) of macula, bilateral: Secondary | ICD-10-CM | POA: Diagnosis not present

## 2020-08-03 DIAGNOSIS — H40033 Anatomical narrow angle, bilateral: Secondary | ICD-10-CM | POA: Diagnosis not present

## 2020-08-03 DIAGNOSIS — E119 Type 2 diabetes mellitus without complications: Secondary | ICD-10-CM | POA: Diagnosis not present

## 2020-08-03 DIAGNOSIS — H2513 Age-related nuclear cataract, bilateral: Secondary | ICD-10-CM | POA: Diagnosis not present

## 2020-08-18 DIAGNOSIS — R928 Other abnormal and inconclusive findings on diagnostic imaging of breast: Secondary | ICD-10-CM | POA: Diagnosis not present

## 2020-08-18 DIAGNOSIS — N6311 Unspecified lump in the right breast, upper outer quadrant: Secondary | ICD-10-CM | POA: Diagnosis not present

## 2020-08-18 DIAGNOSIS — N6312 Unspecified lump in the right breast, upper inner quadrant: Secondary | ICD-10-CM | POA: Diagnosis not present

## 2020-08-18 DIAGNOSIS — N6321 Unspecified lump in the left breast, upper outer quadrant: Secondary | ICD-10-CM | POA: Diagnosis not present

## 2020-08-19 ENCOUNTER — Other Ambulatory Visit: Payer: Self-pay | Admitting: Radiology

## 2020-08-19 DIAGNOSIS — N6321 Unspecified lump in the left breast, upper outer quadrant: Secondary | ICD-10-CM | POA: Diagnosis not present

## 2020-08-19 DIAGNOSIS — N6315 Unspecified lump in the right breast, overlapping quadrants: Secondary | ICD-10-CM | POA: Diagnosis not present

## 2020-08-19 DIAGNOSIS — N6011 Diffuse cystic mastopathy of right breast: Secondary | ICD-10-CM | POA: Diagnosis not present

## 2020-08-19 DIAGNOSIS — C50412 Malignant neoplasm of upper-outer quadrant of left female breast: Secondary | ICD-10-CM | POA: Diagnosis not present

## 2020-08-27 ENCOUNTER — Ambulatory Visit: Payer: Self-pay | Admitting: General Surgery

## 2020-08-27 ENCOUNTER — Other Ambulatory Visit: Payer: Self-pay | Admitting: *Deleted

## 2020-08-27 ENCOUNTER — Encounter: Payer: Self-pay | Admitting: *Deleted

## 2020-08-27 DIAGNOSIS — Z170 Estrogen receptor positive status [ER+]: Secondary | ICD-10-CM

## 2020-08-27 DIAGNOSIS — C50412 Malignant neoplasm of upper-outer quadrant of left female breast: Secondary | ICD-10-CM

## 2020-08-27 DIAGNOSIS — C50411 Malignant neoplasm of upper-outer quadrant of right female breast: Secondary | ICD-10-CM

## 2020-08-28 ENCOUNTER — Encounter: Payer: Self-pay | Admitting: *Deleted

## 2020-08-28 NOTE — Progress Notes (Signed)
Reached out to [employer] to introduce myself as the office RN Navigator and explain our new patient process. Patient is an existing patient with a new diagnosis.   Reviewed with patient any concerns they may have or any possible barriers to attending their appointment.     Oncology Nurse Navigator Documentation  Oncology Nurse Navigator Flowsheets 08/28/2020  Abnormal Finding Date -  Confirmed Diagnosis Date -  Diagnosis Status -  Planned Course of Treatment -  Phase of Treatment -  Navigator Follow Up Date: 09/02/2020  Navigator Follow Up Reason: New Patient Appointment  Navigator Location [facility]  Referral Date to RadOnc/MedOnc 08/27/2020  Navigator Encounter Type Introductory Phone Call  Patient Visit Type MedOnc  Treatment Phase Pre-Tx/Tx Discussion  Barriers/Navigation Needs Coordination of Care;Education  Education Other  Interventions Coordination of Care;Education  Acuity Level 2-Minimal Needs (1-2 Barriers Identified)  Coordination of Care Appts  Education Method Verbal  Time Spent with Patient 45

## 2020-09-01 ENCOUNTER — Inpatient Hospital Stay
Admission: RE | Admit: 2020-09-01 | Discharge: 2020-09-01 | Disposition: A | Discharge status: Home / Self Care | Payer: Self-pay | Source: Ambulatory Visit | Attending: Radiation Oncology | Admitting: Radiation Oncology

## 2020-09-01 ENCOUNTER — Ambulatory Visit
Admission: RE | Admit: 2020-09-01 | Discharge: 2020-09-01 | Disposition: A | Discharge status: Home / Self Care | Payer: Self-pay | Source: Ambulatory Visit | Attending: Radiation Oncology | Admitting: Radiation Oncology

## 2020-09-01 ENCOUNTER — Other Ambulatory Visit: Payer: Self-pay | Admitting: Radiation Oncology

## 2020-09-01 DIAGNOSIS — Z170 Estrogen receptor positive status [ER+]: Secondary | ICD-10-CM

## 2020-09-01 DIAGNOSIS — C50411 Malignant neoplasm of upper-outer quadrant of right female breast: Secondary | ICD-10-CM

## 2020-09-02 ENCOUNTER — Telehealth: Payer: Self-pay | Admitting: Hematology & Oncology

## 2020-09-02 ENCOUNTER — Encounter: Payer: Self-pay | Admitting: *Deleted

## 2020-09-02 ENCOUNTER — Inpatient Hospital Stay: Payer: BC Managed Care – PPO | Attending: Hematology & Oncology

## 2020-09-02 ENCOUNTER — Inpatient Hospital Stay (HOSPITAL_BASED_OUTPATIENT_CLINIC_OR_DEPARTMENT_OTHER): Payer: BC Managed Care – PPO | Admitting: Hematology & Oncology

## 2020-09-02 ENCOUNTER — Encounter: Payer: Self-pay | Admitting: Hematology & Oncology

## 2020-09-02 ENCOUNTER — Other Ambulatory Visit: Payer: Self-pay

## 2020-09-02 VITALS — BP 126/69 | HR 86 | Temp 98.5°F | Resp 20 | Wt 203.0 lb

## 2020-09-02 DIAGNOSIS — M7989 Other specified soft tissue disorders: Secondary | ICD-10-CM | POA: Diagnosis not present

## 2020-09-02 DIAGNOSIS — Z170 Estrogen receptor positive status [ER+]: Secondary | ICD-10-CM | POA: Diagnosis not present

## 2020-09-02 DIAGNOSIS — M2550 Pain in unspecified joint: Secondary | ICD-10-CM

## 2020-09-02 DIAGNOSIS — M791 Myalgia, unspecified site: Secondary | ICD-10-CM | POA: Diagnosis not present

## 2020-09-02 DIAGNOSIS — C50412 Malignant neoplasm of upper-outer quadrant of left female breast: Secondary | ICD-10-CM | POA: Diagnosis not present

## 2020-09-02 DIAGNOSIS — K5900 Constipation, unspecified: Secondary | ICD-10-CM | POA: Insufficient documentation

## 2020-09-02 DIAGNOSIS — R12 Heartburn: Secondary | ICD-10-CM

## 2020-09-02 DIAGNOSIS — G629 Polyneuropathy, unspecified: Secondary | ICD-10-CM | POA: Insufficient documentation

## 2020-09-02 DIAGNOSIS — C50419 Malignant neoplasm of upper-outer quadrant of unspecified female breast: Secondary | ICD-10-CM

## 2020-09-02 DIAGNOSIS — R002 Palpitations: Secondary | ICD-10-CM | POA: Insufficient documentation

## 2020-09-02 DIAGNOSIS — E1140 Type 2 diabetes mellitus with diabetic neuropathy, unspecified: Secondary | ICD-10-CM | POA: Insufficient documentation

## 2020-09-02 DIAGNOSIS — R0602 Shortness of breath: Secondary | ICD-10-CM | POA: Insufficient documentation

## 2020-09-02 DIAGNOSIS — D510 Vitamin B12 deficiency anemia due to intrinsic factor deficiency: Secondary | ICD-10-CM | POA: Diagnosis not present

## 2020-09-02 DIAGNOSIS — D472 Monoclonal gammopathy: Secondary | ICD-10-CM

## 2020-09-02 DIAGNOSIS — R5383 Other fatigue: Secondary | ICD-10-CM | POA: Diagnosis not present

## 2020-09-02 DIAGNOSIS — C9000 Multiple myeloma not having achieved remission: Secondary | ICD-10-CM | POA: Diagnosis not present

## 2020-09-02 DIAGNOSIS — Z79899 Other long term (current) drug therapy: Secondary | ICD-10-CM | POA: Insufficient documentation

## 2020-09-02 DIAGNOSIS — Z7189 Other specified counseling: Secondary | ICD-10-CM

## 2020-09-02 HISTORY — DX: Other specified counseling: Z71.89

## 2020-09-02 LAB — CMP (CANCER CENTER ONLY)
ALT: 16 U/L (ref 0–44)
AST: 19 U/L (ref 15–41)
Albumin: 4.6 g/dL (ref 3.5–5.0)
Alkaline Phosphatase: 54 U/L (ref 38–126)
Anion gap: 9 (ref 5–15)
BUN: 16 mg/dL (ref 8–23)
CO2: 31 mmol/L (ref 22–32)
Calcium: 10.2 mg/dL (ref 8.9–10.3)
Chloride: 99 mmol/L (ref 98–111)
Creatinine: 0.94 mg/dL (ref 0.44–1.00)
GFR, Estimated: >60 mL/min (ref >60)
Glucose, Bld: 140 mg/dL — ABNORMAL HIGH (ref 70–99)
Potassium: 3.5 mmol/L (ref 3.5–5.1)
Sodium: 139 mmol/L (ref 135–145)
Total Bilirubin: 0.8 mg/dL (ref 0.3–1.2)
Total Protein: 8.1 g/dL (ref 6.5–8.1)

## 2020-09-02 LAB — CBC WITH DIFFERENTIAL (CANCER CENTER ONLY)
Abs Immature Granulocytes: 0.03 10*3/uL (ref 0.00–0.07)
Basophils Absolute: 0.1 10*3/uL (ref 0.0–0.1)
Basophils Relative: 1 %
Eosinophils Absolute: 0.5 10*3/uL (ref 0.0–0.5)
Eosinophils Relative: 4 %
HCT: 43.7 % (ref 36.0–46.0)
Hemoglobin: 14 g/dL (ref 12.0–15.0)
Immature Granulocytes: 0 %
Lymphocytes Relative: 43 %
Lymphs Abs: 5.4 10*3/uL — ABNORMAL HIGH (ref 0.7–4.0)
MCH: 29.2 pg (ref 26.0–34.0)
MCHC: 32 g/dL (ref 30.0–36.0)
MCV: 91 fL (ref 80.0–100.0)
Monocytes Absolute: 0.8 10*3/uL (ref 0.1–1.0)
Monocytes Relative: 7 %
Neutro Abs: 5.6 10*3/uL (ref 1.7–7.7)
Neutrophils Relative %: 45 %
Platelet Count: 400 10*3/uL (ref 150–400)
RBC: 4.8 MIL/uL (ref 3.87–5.11)
RDW: 12.6 % (ref 11.5–15.5)
WBC Count: 12.4 10*3/uL — ABNORMAL HIGH (ref 4.0–10.5)
nRBC: 0 % (ref 0.0–0.2)

## 2020-09-02 NOTE — Telephone Encounter (Signed)
Appointments scheduled patient has My Chart per 12/22 los

## 2020-09-02 NOTE — Progress Notes (Signed)
Hematology and Oncology Follow Up Visit  [name] [id number] Apr 03, 1951 69 y.o. 09/02/2020   Principle Diagnosis:  1. Stage IA (T1cN0M0) infiltrating ductal carcinoma of the LEFT breast -- ER+/PR+/HER2- 2. IgG Kappa smoldering myeloma 3. Chronic neuropathy 4. Pernicious Anemia  Current Therapy:   Velcade q week dosing (3/1) -- s/p cycle #2 -- d/c on 02/06/2018 for lack of effectiveness Vit B12 1000 mcg IM q month - start on 03/25/2020  Interim History:  [name] is here today for an early follow-up.  Unfortunately, she now has been diagnosed with an infiltrating ductal carcinoma of the left breast.  She underwent a mammogram in November.  This showed a mass in the left breast in the upper outer quadrant.  Her also was a mass in the right breast.  Diagnostic mammogram was done in December.  This showed that the mass in the right breast was 0.7 cm.  The mass the left breast was 0.8 cm.  She ultimately underwent a biopsy of both masses.  This was done on 08/19/2020.  The pathology report ([id number]) showed the mass in the right breast to be fibrocystic change with usual ductal hyperplasia.  In the left breast however the mass was a invasive ductal carcinoma.  It was grade 2.  The greatest tumor dimension in the core is 1.2 cm.  The prognostic markers show that the tumor was heavily ER positive and PR positive.  It was negative for HER-2.  The proliferation marker was only 2%.  She was seen by general surgery.  She will undergo a lumpectomy in January.  She was then referred to the [place] since she has seen [name] for several years.  She still looks quite good.  She really does not have any specific complaints.  She has had no problems with nausea or vomiting.  She has had no problems with bony pain.  There is been no change in bowel or bladder habits.  She still is dealing with the neuropathy.  She has his smoldering IgG kappa myeloma that we have been  following for several years.  There is been no problems with leg swelling.  She does have neuropathy in the feet.  Currently, I would say her performance status is ECOG 1.   Medications:  Allergies as of 09/02/2020      Reactions   Other    Contact "old cold medicine" caused palpitations    Dristan [pheniramine-phenylephrine] Palpitations      Medication List       Accurate as of September 02, 2020  5:28 PM. If you have any questions, ask your nurse or doctor.        Accu-Chek Softclix Lancets lancets SMARTSIG:Topical   cyanocobalamin 1000 MCG/ML injection Commonly known as: (VITAMIN B-12) Inject 1000 mcg (1 mL) into the muscle every 30 days for 6 months.   cyclobenzaprine 5 MG tablet Commonly known as: FLEXERIL Take 5 mg by mouth 3 (three) times daily as needed.   FreeStyle Libre Reader Kerrin Mo See admin instructions.   HYDROcodone-acetaminophen 10-325 MG tablet Commonly known as: NORCO TAKE 1 TABLET BY MOUTH EVERY SIX HOURS AS NEEDED DOSE CHANGE   metFORMIN 500 MG 24 hr tablet Commonly known as: GLUCOPHAGE-XR Take 1,000 mg by mouth 2 (two) times daily.   metoprolol succinate 100 MG 24 hr tablet Commonly known as: TOPROL-XL Take 100 mg by mouth daily.   morphine 60 MG 12 hr tablet Commonly known as: MS CONTIN Take 60 mg by  mouth every 12 (twelve) hours.   pravastatin 40 MG tablet Commonly known as: PRAVACHOL Take 40 mg by mouth daily.   venlafaxine XR 150 MG 24 hr capsule Commonly known as: EFFEXOR-XR Take 150 mg by mouth daily with breakfast.       Allergies:  Allergies  Allergen Reactions  . Other     Contact "old cold medicine" caused palpitations   . Dristan [Pheniramine-Phenylephrine] Palpitations    Past Medical History, Surgical history, Social history, and Family History were reviewed and updated.  Review of Systems: Review of Systems  Constitutional: Positive for malaise/fatigue.  HENT: Negative.   Eyes: Negative.   Respiratory:  Positive for shortness of breath.   Cardiovascular: Positive for palpitations and leg swelling.  Gastrointestinal: Positive for constipation and heartburn.  Genitourinary: Negative.   Musculoskeletal: Positive for joint pain and myalgias.  Skin: Negative.   Neurological: Positive for tingling.  Endo/Heme/Allergies: Negative.   Psychiatric/Behavioral: Negative.      Physical Exam:  weight is 203 lb (92.1 kg). Her oral temperature is 98.5 F (36.9 C). Her blood pressure is 126/69 and her pulse is 86. Her respiration is 20 and oxygen saturation is 97%.   Wt Readings from Last 3 Encounters:  09/02/20 203 lb (92.1 kg)  04/02/20 (!) 209 lb (94.8 kg)  03/23/20 208 lb (94.3 kg)  Is 1 weight 1 1% patient is no  Physical Exam Vitals reviewed.  Constitutional:      Comments: On her breast exam, the right breast shows the biopsy site to be well-healed.  There is no distinct mass in the right breast.  There is no right axillary adenopathy.  Left breast shows the biopsy scar at the 1 o'clock position.  Again there is mild bruising.  There is no nipple change.  There is no adenopathy in the left axilla.  HENT:     Head: Normocephalic and atraumatic.  Eyes:     Pupils: Pupils are equal, round, and reactive to light.  Cardiovascular:     Rate and Rhythm: Normal rate and regular rhythm.     Heart sounds: Normal heart sounds.  Pulmonary:     Effort: Pulmonary effort is normal.     Breath sounds: Normal breath sounds.  Abdominal:     General: Bowel sounds are normal.     Palpations: Abdomen is soft.  Musculoskeletal:        General: No tenderness or deformity. Normal range of motion.     Cervical back: Normal range of motion.  Lymphadenopathy:     Cervical: No cervical adenopathy.  Skin:    General: Skin is warm and dry.     Findings: No erythema or rash.  Neurological:     Mental Status: She is alert and oriented to person, place, and time.  Psychiatric:        Behavior: Behavior  normal.        Thought Content: Thought content normal.        Judgment: Judgment normal.     Lab Results  Component Value Date   WBC 12.4 (H) 09/02/2020   HGB 14.0 09/02/2020   HCT 43.7 09/02/2020   MCV 91.0 09/02/2020   PLT 400 09/02/2020   Lab Results  Component Value Date   FERRITIN 55 10/03/2019   IRON 68 10/03/2019   TIBC 389 10/03/2019   UIBC 321 10/03/2019   IRONPCTSAT 17 (L) 10/03/2019   Lab Results  Component Value Date   RBC 4.80 09/02/2020   Lab  Results  Component Value Date   KPAFRELGTCHN 42.7 (H) 04/02/2020   LAMBDASER 29.9 (H) 04/02/2020   KAPLAMBRATIO 1.43 04/02/2020   Lab Results  Component Value Date   IGGSERUM 1,493 04/02/2020   IGGSERUM 1,604 (H) 04/02/2020   IGA 73 (L) 04/02/2020   IGA 79 (L) 04/02/2020   IGMSERUM 44 04/02/2020   IGMSERUM 48 04/02/2020   Lab Results  Component Value Date   TOTALPROTELP 7.5 04/02/2020   ALBUMINELP 3.7 04/02/2020   A1GS 0.2 04/02/2020   A2GS 0.9 04/02/2020   BETS 1.2 04/02/2020   BETA2SER 0.3 02/20/2015   GAMS 1.5 04/02/2020   MSPIKE 1.1 (H) 04/02/2020   SPEI * 02/20/2015     Chemistry      Component Value Date/Time   NA 139 09/02/2020 1055   NA 139 03/23/2020 1220   NA 140 03/27/2017 0804   K 3.5 09/02/2020 1055   K 3.7 03/27/2017 0804   CL 99 09/02/2020 1055   CL 94 (L) 02/20/2015 1010   CO2 31 09/02/2020 1055   CO2 28 03/27/2017 0804   BUN 16 09/02/2020 1055   BUN 18 03/23/2020 1220   BUN 14.9 03/27/2017 0804   CREATININE 0.94 09/02/2020 1055   CREATININE 0.9 03/27/2017 0804      Component Value Date/Time   CALCIUM 10.2 09/02/2020 1055   CALCIUM 10.0 03/27/2017 0804   ALKPHOS 54 09/02/2020 1055   ALKPHOS 69 03/27/2017 0804   AST 19 09/02/2020 1055   AST 26 03/27/2017 0804   ALT 16 09/02/2020 1055   ALT 28 03/27/2017 0804   BILITOT 0.8 09/02/2020 1055   BILITOT 0.76 03/27/2017 0804     Impression and Plan: Ms. Kalisz is 69 yo postmenopausal white female with smoldering myeloma  and also chronic neuropathy due to diabetes and chronic back issues.   She now is dealing with breast cancer.  I would have to say that this is going to be a very favorable breast cancer.  We will have to do Oncotype testing on it.  I would have to imagine that the Oncotype score will be quite low given the proliferation index of only 2%.  I would highly doubt that there would be any positive lymph nodes.  She will need to have an MRI of the breasts.  We will have to set this up.  She also going to need an MRI of the brain.  She has had headaches.  I am not too sure exactly what could be going on with the headaches.  I will have to see if the surgery can be moved up any sooner in January.  She is scheduled for January 19.  I do not see where her smoldering myeloma is going to be a problem.  When we last saw her in July, the M spike was only 1.1 g/dL.  Her IgG level was about 1500 mg/dL.  The kappa light chain was 4.3 mg/dL.  I cannot imagine this caused any problems with healing or infection.  We will see her back about a month after her surgery.  By then, we will know exactly what we need to do in the adjuvant setting.  She has a lumpectomy, she will need radiation therapy.  Again, I highly doubt that we will need any chemotherapy.  We will have to see what the Oncotype score is.   [name], MD 12/22/20215:28 PM

## 2020-09-03 ENCOUNTER — Ambulatory Visit
Admission: RE | Admit: 2020-09-03 | Discharge: 2020-09-03 | Disposition: A | Discharge status: Home / Self Care | Payer: BC Managed Care – PPO | Source: Ambulatory Visit | Attending: Family Medicine | Admitting: Family Medicine

## 2020-09-03 ENCOUNTER — Other Ambulatory Visit: Payer: Self-pay | Admitting: Family Medicine

## 2020-09-03 ENCOUNTER — Encounter: Payer: Self-pay | Admitting: *Deleted

## 2020-09-03 ENCOUNTER — Ambulatory Visit: Payer: BC Managed Care – PPO

## 2020-09-03 ENCOUNTER — Ambulatory Visit
Admission: RE | Admit: 2020-09-03 | Discharge: 2020-09-03 | Disposition: A | Discharge status: Home / Self Care | Payer: BC Managed Care – PPO | Source: Ambulatory Visit | Attending: Hematology & Oncology | Admitting: Hematology & Oncology

## 2020-09-03 DIAGNOSIS — C50419 Malignant neoplasm of upper-outer quadrant of unspecified female breast: Secondary | ICD-10-CM

## 2020-09-03 DIAGNOSIS — S60221A Contusion of right hand, initial encounter: Secondary | ICD-10-CM | POA: Diagnosis not present

## 2020-09-03 DIAGNOSIS — S60211A Contusion of right wrist, initial encounter: Secondary | ICD-10-CM

## 2020-09-03 DIAGNOSIS — M7989 Other specified soft tissue disorders: Secondary | ICD-10-CM | POA: Diagnosis not present

## 2020-09-03 DIAGNOSIS — S62111A Displaced fracture of triquetrum [cuneiform] bone, right wrist, initial encounter for closed fracture: Secondary | ICD-10-CM | POA: Diagnosis not present

## 2020-09-03 DIAGNOSIS — M19031 Primary osteoarthritis, right wrist: Secondary | ICD-10-CM | POA: Diagnosis not present

## 2020-09-03 DIAGNOSIS — S0990XA Unspecified injury of head, initial encounter: Secondary | ICD-10-CM | POA: Diagnosis not present

## 2020-09-03 DIAGNOSIS — S0093XA Contusion of unspecified part of head, initial encounter: Secondary | ICD-10-CM | POA: Diagnosis not present

## 2020-09-03 DIAGNOSIS — Z170 Estrogen receptor positive status [ER+]: Secondary | ICD-10-CM

## 2020-09-03 LAB — KAPPA/LAMBDA LIGHT CHAINS
Kappa free light chain: 39.5 mg/L — ABNORMAL HIGH (ref 3.3–19.4)
Kappa, lambda light chain ratio: 1.47 (ref 0.26–1.65)
Lambda free light chains: 26.9 mg/L — ABNORMAL HIGH (ref 5.7–26.3)

## 2020-09-03 LAB — IGG, IGA, IGM
IgA: 69 mg/dL — ABNORMAL LOW (ref 87–352)
IgG (Immunoglobin G), Serum: 1607 mg/dL — ABNORMAL HIGH (ref 586–1602)
IgM (Immunoglobulin M), Srm: 50 mg/dL (ref 26–217)

## 2020-09-03 MED ORDER — GADOBENATE DIMEGLUMINE 529 MG/ML IV SOLN
19.0000 mL | Freq: Once | INTRAVENOUS | Status: AC | PRN
  Administered 2020-09-03: 19 mL via INTRAVENOUS

## 2020-09-03 NOTE — Progress Notes (Signed)
Oncology Nurse Navigator Documentation  Oncology Nurse Navigator Flowsheets 09/03/2020  Abnormal Finding Date -  Confirmed Diagnosis Date -  Diagnosis Status -  Planned Course of Treatment -  Phase of Treatment -  Expected Surgery Date -  Navigator Follow Up Date: 09/16/2020  Navigator Follow Up Reason: Scan Review  Navigator Location [facility]  Referral Date to RadOnc/MedOnc -  Navigator Encounter Type Scan Review  Patient Visit Type MedOnc  Treatment Phase Pre-Tx/Tx Discussion  Barriers/Navigation Needs Coordination of Care;Education  Education -  Interventions None Required  Acuity Level 2-Minimal Needs (1-2 Barriers Identified)  Coordination of Care -  Education Method -  Support Groups/Services Friends and Family  Time Spent with Patient 15

## 2020-09-03 NOTE — Progress Notes (Signed)
Initial RN Navigator Patient Visit  Name: [name] Date of Referral : Established patient Diagnosis: 1. Stage IA (T1cN0M0) infiltrating ductal carcinoma of the LEFT breast -- ER+/PR+/HER2-  Met with patient prior to their visit with MD. Hanley Seamen patient "Your Patient Navigator" handout which explains my role, areas in which I am able to help, and all the contact information for myself and the office. Also gave patient MD and Navigator business card. Reviewed with patient the general overview of expected course after initial diagnosis and time frame for all steps to be completed.  New patient packet given to patient which includes: orientation to office and staff; campus directory; education on My Chart and Advance Directives; and patient centered education on Breast Cancer. Breast binder, bag, and pillow from [facility] also given to patient.   Patient completed visit with Dr. Marin Olp.   Patient will need breast and brain MRI. She is already scheduled for surgery on 09/30/20. She is attempting to have that moved up and the surgeons office is working with her.   Patient understands all follow up procedures and expectations. They have my number to reach out for any further clarification or additional needs.   Oncology Nurse Navigator Documentation  Oncology Nurse Navigator Flowsheets 09/02/2020  Abnormal Finding Date 07/15/2020  Confirmed Diagnosis Date 08/19/2020  Diagnosis Status Confirmed Diagnosis Complete  Planned Course of Treatment Surgery  Phase of Treatment Surgery  Expected Surgery Date 09/30/2020  Navigator Follow Up Date: 09/03/2020  Navigator Follow Up Reason: Scan Review  Navigator Location [facility]  Referral Date to RadOnc/MedOnc -  Navigator Encounter Type Initial MedOnc  Patient Visit Type MedOnc  Treatment Phase Pre-Tx/Tx Discussion  Barriers/Navigation Needs Coordination of Care;Education  Education Newly Diagnosed Cancer Education  Interventions  Coordination of Care;Education;Psycho-Social Support  Acuity Level 2-Minimal Needs (1-2 Barriers Identified)  Coordination of Care Radiology  Education Method Verbal;Written  Support Groups/Services [place] Guide;Friends and Family  Time Spent with Patient 20

## 2020-09-08 LAB — PROTEIN ELECTROPHORESIS, SERUM, WITH REFLEX
A/G Ratio: 1.1 (ref 0.7–1.7)
Albumin ELP: 4.1 g/dL (ref 2.9–4.4)
Alpha-1-Globulin: 0.2 g/dL (ref 0.0–0.4)
Alpha-2-Globulin: 1 g/dL (ref 0.4–1.0)
Beta Globulin: 1.2 g/dL (ref 0.7–1.3)
Gamma Globulin: 1.6 g/dL (ref 0.4–1.8)
Globulin, Total: 3.9 g/dL (ref 2.2–3.9)
M-Spike, %: 1.1 g/dL — ABNORMAL HIGH
SPEP Interpretation: 0
Total Protein ELP: 8 g/dL (ref 6.0–8.5)

## 2020-09-08 LAB — IMMUNOFIXATION REFLEX, SERUM
IgA: 83 mg/dL — ABNORMAL LOW (ref 87–352)
IgG (Immunoglobin G), Serum: 1952 mg/dL — ABNORMAL HIGH (ref 586–1602)
IgM (Immunoglobulin M), Srm: 66 mg/dL (ref 26–217)

## 2020-09-09 DIAGNOSIS — M25531 Pain in right wrist: Secondary | ICD-10-CM | POA: Diagnosis not present

## 2020-09-14 NOTE — Progress Notes (Signed)
Location of Breast Cancer: Left Breast  Has smoldering myeloma and chronic neuropathy.  Did patient present with symptoms (if so, please note symptoms) or was this found on screening mammography?: Routine Mammogram.  Diagnostic Mammogram 08/2020: The mass in the right breast is 0.7 cm.  The mass in the left breast is 0.8 cm.  Mammogram 07/2020: Mass in the left breast in the upper outer quadrant.  Also mass in the right breast.  Histology per Pathology Report: 08/19/2020  Receptor Status: ER(+95%), PR (+95%), Her2-neu (-), Ki-67(2%)    Past/Anticipated interventions by surgeon, if any: Dr. Marlou Starks 08/27/2020 -At this point she favors breast conservation which I feel is a very reasonable way of treating her cancer. -She is also a candidate for sentinel node biopsy.   -I will go ahead and refer her to medical and radiation oncology to discuss adjuvant therapy. -I will also send her for preoperative lymphedema testing. -Left Breast Lumpectomy with radioactive seed and sentinel lymph node biopsy 09/30/2020  Past/Anticipated interventions by medical oncology, if any: Chemotherapy  Dr. Martha Clan 09/02/2020 - I would have to say that this is going to be a very favorable breast cancer.  We will have to do Oncotype testing on it.  I would have to imagine that the Oncotype score will be quite low given the proliferation index of only 2%. -I would highly doubt that there would be any positive lymph nodes. -She will need to have an MRI of the breasts.  We will have to set this up. -She also going to need an MRI of the brain.  She has had headaches.  I am not too sure exactly what could be going on with the headaches. -I will have to see if the surgery can be moved up any sooner in January.  She is scheduled for January 19. -I do not see where her smoldering myeloma is going to be a problem.  When we last saw her in July, the M spike was only 1.1 g/dL.  Her IgG level was about 1500 mg/dL.  The kappa  light chain was 4.3 mg/dL.  I cannot imagine this caused any problems with healing or infection. -We will see her back about a month after her surgery.  By then, we will know exactly what we need to do in the adjuvant setting.  She has a lumpectomy, she will need radiation therapy. -I highly doubt that we will need any chemotherapy.  We will have to see what the Oncotype score is.  Lymphedema issues, if any:  No  Pain issues, if any:  No  SAFETY ISSUES:  Prior radiation? No  Pacemaker/ICD? No  Possible current pregnancy? Postmenopausal  Is the patient on methotrexate?  No  Current Complaints / other details:      Cori Razor, RN 09/14/2020,1:16 PM

## 2020-09-15 ENCOUNTER — Ambulatory Visit
Admission: RE | Admit: 2020-09-15 | Discharge: 2020-09-15 | Disposition: A | Discharge status: Home / Self Care | Payer: BC Managed Care – PPO | Source: Ambulatory Visit | Attending: Radiation Oncology | Admitting: Radiation Oncology

## 2020-09-15 ENCOUNTER — Other Ambulatory Visit: Payer: Self-pay

## 2020-09-15 ENCOUNTER — Encounter: Payer: Self-pay | Admitting: Radiation Oncology

## 2020-09-15 VITALS — BP 143/77 | HR 90 | Temp 96.8°F | Resp 18 | Ht 66.0 in | Wt 203.4 lb

## 2020-09-15 DIAGNOSIS — C50411 Malignant neoplasm of upper-outer quadrant of right female breast: Secondary | ICD-10-CM

## 2020-09-15 DIAGNOSIS — C50412 Malignant neoplasm of upper-outer quadrant of left female breast: Secondary | ICD-10-CM | POA: Diagnosis not present

## 2020-09-15 DIAGNOSIS — Z170 Estrogen receptor positive status [ER+]: Secondary | ICD-10-CM

## 2020-09-15 DIAGNOSIS — R51 Headache with orthostatic component, not elsewhere classified: Secondary | ICD-10-CM | POA: Insufficient documentation

## 2020-09-15 HISTORY — DX: Malignant neoplasm of unspecified site of unspecified female breast: C50.919

## 2020-09-16 ENCOUNTER — Ambulatory Visit
Admission: RE | Admit: 2020-09-16 | Discharge: 2020-09-16 | Disposition: A | Discharge status: Home / Self Care | Payer: BC Managed Care – PPO | Source: Ambulatory Visit | Attending: Hematology & Oncology | Admitting: Hematology & Oncology

## 2020-09-16 ENCOUNTER — Encounter: Payer: Self-pay | Admitting: *Deleted

## 2020-09-16 DIAGNOSIS — N6321 Unspecified lump in the left breast, upper outer quadrant: Secondary | ICD-10-CM | POA: Diagnosis not present

## 2020-09-16 DIAGNOSIS — C50419 Malignant neoplasm of upper-outer quadrant of unspecified female breast: Secondary | ICD-10-CM

## 2020-09-16 DIAGNOSIS — Z170 Estrogen receptor positive status [ER+]: Secondary | ICD-10-CM

## 2020-09-16 MED ORDER — GADOBUTROL 1 MMOL/ML IV SOLN
10.0000 mL | Freq: Once | INTRAVENOUS | Status: AC | PRN
  Administered 2020-09-16: 10 mL via INTRAVENOUS

## 2020-09-16 NOTE — Progress Notes (Signed)
[facility] Psychosocial Distress Screening Clinical Social Work  Clinical Social Work was referred by distress screening protocol.  The patient scored a 6 on the Psychosocial Distress Thermometer which indicates moderate distress. Clinical Social Worker contacted patient by phone to assess for distress and other psychosocial needs.   Patient returned CSW phone call.  Patient stated she was doing "ok" after meeting with the radiation team.  CSW and patient discussed common feelings and emotions when being diagnosed with cancer.  CSW provided education on CSW role and the support team at [facility].  Patient was agreeable to an [employer] referral.  CSW provide contact information and encouraged patient to call with questions or concerns.         ONCBCN DISTRESS SCREENING 09/15/2020  Screening Type Initial Screening  Distress experienced in past week (1-10) 6  Emotional problem type Depression;Nervousness/Anxiety;Feeling hopeless;Boredom  Spiritual/Religous concerns type Loss of sense of purpose  Physical Problem type Pain;Sleep/insomnia;Getting around;Tingling hands/feet  Other [phone]    Tamala Julian, MSW, LCSW, OSW-C Clinical Social Worker [facility] [phone]

## 2020-09-16 NOTE — Progress Notes (Signed)
[facility] Psychosocial Distress Screening Clinical Social Work  Clinical Social Work was referred by distress screening protocol.  The patient scored a 6 on the Psychosocial Distress Thermometer which indicates moderate distress. Clinical Social Worker contacted patient by phone to assess for distress and other psychosocial needs.  CSw left patient a voicemail offering support and information on the patient and family support team.  CSW provided contact information and encouraged patient to return call.   ONCBCN DISTRESS SCREENING 09/15/2020  Screening Type Initial Screening  Distress experienced in past week (1-10) 6  Emotional problem type Depression;Nervousness/Anxiety;Feeling hopeless;Boredom  Spiritual/Religous concerns type Loss of sense of purpose  Physical Problem type Pain;Sleep/insomnia;Getting around;Tingling hands/feet  Other [phone]    Tamala Julian, MSW, LCSW, OSW-C Clinical Social Worker [facility] [phone]

## 2020-09-17 ENCOUNTER — Other Ambulatory Visit: Payer: Self-pay

## 2020-09-17 ENCOUNTER — Ambulatory Visit: Payer: BC Managed Care – PPO | Attending: General Surgery | Admitting: Rehabilitation

## 2020-09-17 ENCOUNTER — Encounter: Payer: Self-pay | Admitting: Rehabilitation

## 2020-09-17 ENCOUNTER — Encounter: Payer: Self-pay | Admitting: *Deleted

## 2020-09-17 DIAGNOSIS — Z170 Estrogen receptor positive status [ER+]: Secondary | ICD-10-CM | POA: Insufficient documentation

## 2020-09-17 DIAGNOSIS — C50411 Malignant neoplasm of upper-outer quadrant of right female breast: Secondary | ICD-10-CM | POA: Diagnosis not present

## 2020-09-17 DIAGNOSIS — R293 Abnormal posture: Secondary | ICD-10-CM | POA: Insufficient documentation

## 2020-09-17 NOTE — Patient Instructions (Signed)

## 2020-09-17 NOTE — Therapy (Signed)
[place], [place], [address] Phone: [phone]   Fax:  [phone]  Physical Therapy Evaluation  Patient Details  Name: [name] MRN: [id number] Date of Birth: August 16, 1951 Referring Provider (PT): Dr. Marlou Starks   Encounter Date: 09/17/2020   PT End of Session - 09/17/20 1325    Visit Number 1    Number of Visits 2    Date for PT Re-Evaluation 11/12/20    PT Start Time 1102    PT Stop Time 1132    PT Time Calculation (min) 30 min    Activity Tolerance Patient tolerated treatment well    Behavior During Therapy [facility] for tasks assessed/performed           Past Medical History:  Diagnosis Date  . Allergies   . Asthma    treated by pulmonologist   . Atherosclerosis of aorta ([place])    noted on xray  . Breast cancer ([place])    Right  . Chronic pain   . Diabetes ([place])    type II  . Edema   . Goals of care, counseling/discussion 09/02/2020  . High cholesterol   . History of nuclear stress test 11/12/2009   normal   . Hypertension   . IgG monoclonal gammopathy of uncertain significance    igG kappa monoclonal gammopathy of unkown significance (per notes from [employer]).  . Insomnia   . Low back pain   . Migraine headache   . Neuropathy associated with MGUS ([place])   . Peripheral neuropathy   . Smoldering multiple myeloma ([place]) 12/04/2017    Past Surgical History:  Procedure Laterality Date  . BACK SURGERY  2019   T10-S1 Fusion Dr. Patrice Paradise  . BACK SURGERY  2019   2 fusion in lower back (L4-S1) and neck  . breast cyst removal age 5    . CESAREAN SECTION     x2  . hernia repair at 18 months old    . miscarriage D&C    . NASAL SINUS SURGERY     x2  . NECK SURGERY    . pain stimulator in back    . TONSILLECTOMY      There were no vitals filed for this visit.    Subjective Assessment - 09/17/20 1102    Subjective doing okay    Pertinent History Recent diagnosis of Lt breast  cancer IDC stage IA with surgery scheduled for 09/30/20 with Dr. Marlou Starks.  pt is followed by Dr. Marin Olp due to smoldering igG Kappa myeloma.  Other history includes: DM, Peripheral neuropathy, and HTN, cervical fusion with limited movement and back T10-S1, vertigo, recent fall with hand fracture    Patient Stated Goals get information from all providers    Currently in Pain? Yes    Pain Score 5    from bil foot neuropathy             [facility] PT Assessment - 09/17/20 0001      Assessment   Medical Diagnosis Lt breast cancer    Referring Provider (PT) Dr. Marlou Starks    Onset Date/Surgical Date 08/12/20    Hand Dominance Right      Precautions   Precaution Comments fall, active cancer      Restrictions   Weight Bearing Restrictions No      Balance Screen   Has the patient fallen in the past 6 months Yes    How many times? 1    Has the patient  had a decrease in activity level because of a fear of falling?  No    Is the patient reluctant to leave their home because of a fear of falling?  No      Home Social worker Private residence    Living Arrangements Children;Spouse/significant other    Available Help at Discharge Family    Type of [place] to enter    [place] - single point;[place] - 2 wheels      Prior Function   Level of Independence Independent    Vocation Retired    Leisure no activity      Cognition   Overall Cognitive Status Within Functional Limits for tasks assessed      Sensation   Additional Comments bil neuropathy pain and numbness in the feet      Coordination   Gross Motor Movements are Fluid and Coordinated Yes      Posture/Postural Control   Posture/Postural Control Postural limitations    Postural Limitations Rounded Shoulders      ROM / Strength   AROM / PROM / Strength AROM      AROM   AROM Assessment Site Shoulder    Right/Left Shoulder Right;Left    Right Shoulder  Extension 65 Degrees    Right Shoulder Flexion 130 Degrees    Right Shoulder ABduction 140 Degrees    Right Shoulder Internal Rotation 70 Degrees    Right Shoulder External Rotation 85 Degrees             LYMPHEDEMA/ONCOLOGY QUESTIONNAIRE - 09/17/20 0001      Type   Cancer Type left breast      Lymphedema Assessments   Lymphedema Assessments Upper extremities      Right Upper Extremity Lymphedema   10 cm Proximal to Olecranon Process 35.3 cm    Olecranon Process 32.5 cm    Other brace on hand unable to measure lower arm      Left Upper Extremity Lymphedema   10 cm Proximal to Olecranon Process 35 cm    Olecranon Process 33 cm    10 cm Proximal to Ulnar Styloid Process 20.5 cm    Just Proximal to Ulnar Styloid Process 17.5 cm    Across Hand at [employer] 19.3 cm    At [place] of 2nd Digit 6.5 cm                   Objective measurements completed on examination: See above findings.               PT Education - 09/17/20 1323    Education Details post op exercises, lymphedema briefly, PT POC    Person(s) Educated Patient    Methods Explanation;Demonstration;Tactile cues;Verbal cues;Handout    Comprehension Verbalized understanding;Returned demonstration;Verbal cues required;Need further instruction                Breast Clinic Goals - 09/17/20 1331      Patient will be able to verbalize understanding of pertinent lymphedema risk reduction practices relevant to her diagnosis specifically related to skin care.   Status Achieved      Patient will be able to return demonstrate and/or verbalize understanding of the post-op home exercise program related to regaining shoulder range of motion.   Status Achieved      Patient will be able to verbalize understanding of the importance of attending the  postoperative After Breast Cancer Class for further lymphedema risk reduction education and therapeutic exercise.   Status Achieved                  Plan - 09/17/20 1325    Clinical Impression Statement Pt presents pre left lumpectomy for baseline analysis including SOZO, circumferential measurements, and AROM and education on post operative exercises and PT plan of care.  Pt overall has decreased balance due to lumbar fusion, DM and neuropathy as well and decreased Rt shoulder AROM with recent hand fracture and decreased left shoulder AROM but functional.  Pt has considered returning to neurorehab where she did work on balance and vertigo but overall is sedentary at home.  Pt was educated on walking importance, activity in general, and post op exercises to improve mobility.    Personal Factors and Comorbidities Age;Fitness;Comorbidity 2    Comorbidities neuropathy, myeloma,    Examination-Activity Limitations Carry;Dressing;Reach Overhead    Examination-Participation Restrictions Cleaning;Yard Work    Stability/Clinical Decision Making Stable/Uncomplicated    Designer, jewellery Low    Rehab Potential Excellent    PT Frequency --   2 visits   PT Duration 8 weeks    PT Treatment/Interventions ADLs/Self Care Home Management;Therapeutic exercise;Patient/family education;Manual techniques    PT Next Visit Plan post op recheck    PT Home Exercise Plan post op, walking    Consulted and Agree with Plan of Care Patient           Patient will benefit from skilled therapeutic intervention in order to improve the following deficits and impairments:  Decreased range of motion,Decreased knowledge of precautions,Postural dysfunction  Visit Diagnosis: Malignant neoplasm of upper-outer quadrant of right breast in female, estrogen receptor positive ([place])  Abnormal posture     Problem List Patient Active Problem List   Diagnosis Date Noted  . Goals of care, counseling/discussion 09/02/2020  . Malignant neoplasm of upper-outer quadrant of right breast in female, estrogen receptor positive ([place]) 08/27/2020  . Iron deficiency  anemia 10/09/2019  . Impaired mobility and activities of daily living 08/30/2018  . Multiple myeloma ([place]) 08/30/2018  . S/P spinal fusion 08/30/2018  . Lumbar degenerative disc disease 08/27/2018  . History of degenerative disc disease 08/15/2018  . Neck pain 08/15/2018  . Sensorineural hearing loss (SNHL), bilateral 08/15/2018  . Tinnitus of both ears 08/15/2018  . Smoldering multiple myeloma ([place]) 12/04/2017  . Breakdown (mechanical) of implanted electronic neurostimulator, generator, initial encounter ([place]) 08/12/2016  . Myalgia 08/12/2016  . Post laminectomy syndrome 08/12/2016  . Migraine with aura and without status migrainosus, not intractable 10/15/2015  . Intrinsic asthma 01/11/2010  . Dyspnea 12/14/2009  . DIABETES MELLITUS, TYPE II 12/11/2009  . Hyperlipidemia 12/11/2009  . MIGRAINE HEADACHE 12/11/2009  . Hereditary and idiopathic peripheral neuropathy 12/11/2009  . Hypertension 12/11/2009  . Insomnia 12/11/2009  . EDEMA 12/11/2009  . Type 2 diabetes mellitus with diabetic polyneuropathy ([place]) 12/11/2009    Stark Bray 09/17/2020, 1:32 PM  [place], [place], [address] Phone: [phone]   Fax:  [phone]  Name: [name] MRN: [id number] Date of Birth: Apr 30, 1951

## 2020-09-17 NOTE — Progress Notes (Signed)
Oncology Nurse Navigator Documentation  Oncology Nurse Navigator Flowsheets 09/17/2020  Abnormal Finding Date -  Confirmed Diagnosis Date -  Diagnosis Status -  Planned Course of Treatment -  Phase of Treatment -  Expected Surgery Date -  Navigator Follow Up Date: 09/30/2020  Navigator Follow Up Reason: Surgery  Navigator Location [facility]  Referral Date to RadOnc/MedOnc -  Navigator Encounter Type Scan Review  Patient Visit Type MedOnc  Treatment Phase Pre-Tx/Tx Discussion  Barriers/Navigation Needs Coordination of Care;Education  Education -  Interventions None Required  Acuity Level 2-Minimal Needs (1-2 Barriers Identified)  Coordination of Care -  Education Method -  Support Groups/Services Friends and Family  Time Spent with Patient 15

## 2020-09-21 ENCOUNTER — Encounter: Payer: Self-pay | Admitting: *Deleted

## 2020-09-21 ENCOUNTER — Encounter: Payer: Medicare Other | Admitting: Obstetrics and Gynecology

## 2020-09-21 NOTE — Progress Notes (Cosign Needed)
Radiation Oncology         [phone] ________________________________  Name: [name]        MRN: [id number]  Date of Service: 09/15/2020 DOB: 05-Jan-1951  ZW:CHENI, Hal Hope, MD  Jovita Kussmaul, MD     REFERRING PHYSICIAN: [name], MD   DIAGNOSIS: The encounter diagnosis was Malignant neoplasm of upper-outer quadrant of right breast in female, estrogen receptor positive ([place]).   HISTORY OF PRESENT ILLNESS: [name] is a 70 y.o. female with  a new diagnosis of left breast cancer. The patient was noted to have a history of myeloma and has been managed with Dr. Marin Olp and off of systemic therapy since 2019. She recently on screening mammogram was found to have a mass in both breasts, by diagnostic imaging the left breast mass measured 6 x 9 x 6 mm was lobulated in the 1 o'clock position, there is no left axillary adenopathy.  The right mass measured 7 x 6 x 6 at the 2 o'clock position and again no adenopathy was identified.  She underwent biopsies of both masses on 08/19/2020 and her right breast mass was negative for malignancy consistent with usual ductal hyperplasia and fibrocystic change though the left breast specimen was consistent with an invasive ductal carcinoma grade 2 with associated DCIS in her left cancer was ER/PR positive, HER-2 was negative with a Ki-67 of 2%.  She is contacted today to discuss treatment recommendations for her cancer.     PREVIOUS RADIATION THERAPY: No   PAST MEDICAL HISTORY:  Past Medical History:  Diagnosis Date  . Allergies   . Asthma    treated by pulmonologist   . Atherosclerosis of aorta ([place])    noted on xray  . Breast cancer ([place])    Right  . Chronic pain   . Diabetes ([place])    type II  . Edema   . Goals of care, counseling/discussion 09/02/2020  . High cholesterol   . History of nuclear stress test 11/12/2009   normal   . Hypertension   . IgG monoclonal gammopathy of uncertain significance    igG kappa  monoclonal gammopathy of unkown significance (per notes from [employer]).  . Insomnia   . Low back pain   . Migraine headache   . Neuropathy associated with MGUS ([place])   . Peripheral neuropathy   . 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 BACK SURGERY  2019   T10-S1 Fusion Dr. Patrice Paradise  . BACK SURGERY  2019   2 fusion in lower back (L4-S1) and neck  . breast cyst removal age 86    . CESAREAN SECTION     x2  . hernia repair at 80 months old    . miscarriage D&C    . NASAL SINUS SURGERY     x2  . NECK SURGERY    . pain stimulator in back    . TONSILLECTOMY       FAMILY HISTORY:  Family History  Problem Relation Age of Onset  . Heart disease Mother   . CAD Mother   . Pneumonia Mother   . Leukemia Father   . Hepatitis Father   . Hypertension Sister   . Heart attack Sister   . Stroke Sister   . Stroke Maternal Grandfather   . Glaucoma Maternal Grandfather        "blind"  . Stroke Maternal  Grandmother   . Other Maternal Grandmother        hardening of arteries  . Healthy Son   . Healthy Daughter   . Neuropathy Neg Hx        of what is known  . Diabetes Neg Hx        of what is known     SOCIAL HISTORY:  reports that she has never smoked. She has never used smokeless tobacco. She reports that she does not drink alcohol and does not use drugs. The patient is married and lives in [place]. She's accompanied by her daughter who is a physical therapist.   ALLERGIES: Other and Dristan [pheniramine-phenylephrine]   MEDICATIONS:  Current Outpatient Medications  Medication Sig Dispense Refill  . Accu-Chek Softclix Lancets lancets SMARTSIG:Topical    . Continuous Blood Gluc Receiver (FREESTYLE LIBRE READER) DEVI See admin instructions.    . Continuous Blood Gluc Sensor (FREESTYLE LIBRE 14 DAY SENSOR) MISC AS DIRECTED ONE EVERY 2 WEEKS    . Continuous Blood Gluc Sensor  (FREESTYLE LIBRE 14 DAY SENSOR) MISC Apply topically.    . cyanocobalamin (,VITAMIN B-12,) 1000 MCG/ML injection Inject 1000 mcg (1 mL) into the muscle every 30 days for 6 months. 6 mL 0  . HYDROcodone-acetaminophen (NORCO) 10-325 MG tablet TAKE 1 TABLET BY MOUTH EVERY SIX HOURS AS NEEDED DOSE CHANGE    . metFORMIN (GLUCOPHAGE-XR) 500 MG 24 hr tablet Take 1,000 mg by mouth 2 (two) times daily.     . metoprolol (TOPROL-XL) 100 MG 24 hr tablet Take 100 mg by mouth daily.    Marland Kitchen morphine (MS CONTIN) 60 MG 12 hr tablet Take 60 mg by mouth every 12 (twelve) hours.   0  . pravastatin (PRAVACHOL) 40 MG tablet Take 40 mg by mouth daily.  1  . venlafaxine XR (EFFEXOR-XR) 150 MG 24 hr capsule Take 150 mg by mouth daily with breakfast.    . cyclobenzaprine (FLEXERIL) 5 MG tablet Take 5 mg by mouth 3 (three) times daily as needed.     No current facility-administered medications for this encounter.     REVIEW OF SYSTEMS: On review of systems, the patient reports that she is doing well overall. She denies any concerns with her breasts at this time.      PHYSICAL EXAM:  Wt Readings from Last 3 Encounters:  09/15/20 203 lb 6 oz (92.3 kg)  09/02/20 203 lb (92.1 kg)  04/02/20 (!) 209 lb (94.8 kg)   Temp Readings from Last 3 Encounters:  09/15/20 (!) 96.8 F (36 C) (Temporal)  09/02/20 98.5 F (36.9 C) (Oral)  04/02/20 98.9 F (37.2 C) (Oral)   BP Readings from Last 3 Encounters:  09/15/20 (!) 143/77  09/02/20 126/69  05/05/20 128/72   Pulse Readings from Last 3 Encounters:  09/15/20 90  09/02/20 86  04/02/20 83    In general this is a well appearing caucasian female in no acute distress. She's alert and oriented x4 and appropriate throughout the examination. Cardiopulmonary assessment is negative for acute distress and she exhibits normal effort. Bilateral breast exam is deferred.    ECOG = 0  0 - Asymptomatic (Fully active, able to carry on all predisease activities without  restriction)  1 - Symptomatic but completely ambulatory (Restricted in physically strenuous activity but ambulatory and able to carry out work of a light or sedentary nature. For example, light housework, office work)  2 - Symptomatic, <50% in bed during the day (Ambulatory and capable of  all self care but unable to carry out any work activities. Up and about more than 50% of waking hours)  3 - Symptomatic, >50% in bed, but not bedbound (Capable of only limited self-care, confined to bed or chair 50% or more of waking hours)  4 - Bedbound (Completely disabled. Cannot carry on any self-care. Totally confined to bed or chair)  5 - Death   Eustace Pen MM, Creech RH, Tormey DC, et al. [phone]). "Toxicity and response criteria of the [facility]". [place] Oncol. 5 (6): 649-55    LABORATORY DATA:  Lab Results  Component Value Date   WBC 12.4 (H) 09/02/2020   HGB 14.0 09/02/2020   HCT 43.7 09/02/2020   MCV 91.0 09/02/2020   PLT 400 09/02/2020   Lab Results  Component Value Date   NA 139 09/02/2020   K 3.5 09/02/2020   CL 99 09/02/2020   CO2 31 09/02/2020   Lab Results  Component Value Date   ALT 16 09/02/2020   AST 19 09/02/2020   ALKPHOS 54 09/02/2020   BILITOT 0.8 09/02/2020      RADIOGRAPHY: DG Wrist 2 Views Right  Result Date: 09/03/2020 CLINICAL DATA:  Right hand and wrist pain after fall yesterday, contusion EXAM: RIGHT HAND - 2 VIEW; RIGHT WRIST - 2 VIEW COMPARISON:  None. FINDINGS: Right hand: Frontal and lateral views are obtained. There are no acute displaced fractures. Diffuse interphalangeal joint space narrowing consistent with osteoarthritis. Soft tissues are unremarkable. Right wrist: Frontal, oblique, lateral views demonstrate a small fracture off the dorsal margin of the triquetrum. There is dorsal soft tissue swelling. Alignment is anatomic. Mild osteoarthritis at the carpometacarpal joints. IMPRESSION: 1. Small fracture off the dorsal margin  of the triquetrum, with overlying soft tissue swelling. 2. Diffuse osteoarthritis. Electronically Signed   By: Randa Ngo M.D.   On: 09/03/2020 15:08   MR Brain W Wo Contrast  Result Date: 09/03/2020 CLINICAL DATA:  Malignant neoplasm of upper-outer quadrant of breast in female, estrogen receptor positive, unspecified laterality. Breast cancer, staging; vertigo. Additional history provided by scanning technologist: Patient reports recent diagnosis of breast cancer, migraines with vertigo, fall with injury. EXAM: MRI HEAD WITHOUT AND WITH CONTRAST TECHNIQUE: Multiplanar, multiecho pulse sequences of the brain and surrounding structures were obtained without and with intravenous contrast. CONTRAST:  84m MULTIHANCE GADOBENATE DIMEGLUMINE 529 MG/ML IV SOLN COMPARISON:  Head CT 01/19/2019. FINDINGS: Brain: Mild cerebral and cerebellar atrophy. Chronic small vessel white matter infarcts within the left frontal lobe (series 10, image 13) and posterior left temporal lobe (series 10, image 12). Background mild multifocal T2/FLAIR hyperintensity within the cerebral white matter is nonspecific, but compatible with chronic small vessel ischemic disease. No abnormal intracranial enhancement is demonstrate to suggest intracranial metastatic disease. There is no acute infarct. No evidence of intracranial mass. No chronic intracranial blood products. No extra-axial fluid collection. No midline shift. Partially empty sella turcica. Vascular: Expected proximal arterial flow voids. Skull and upper cervical spine: No focal marrow lesion. Susceptibility artifact arising from surgical hardware within the cervical spine. Sinuses/Orbits: Visualized orbits show no acute finding. Small right maxillary sinus mucous retention cyst. Trace bilateral ethmoid and maxillary sinus mucosal thickening. IMPRESSION: No evidence of intracranial metastatic disease. No evidence of acute intracranial abnormality. Chronic small vessel white matter  infarcts within the left frontal lobe and posterior left temporal lobe. Background mild cerebral white matter chronic small vessel ischemic disease. Mild generalized atrophy of the brain. Electronically Signed   By:  Kellie Simmering DO   On: 09/03/2020 12:14   DG Hand 2 View Right  Result Date: 09/03/2020 CLINICAL DATA:  Right hand and wrist pain after fall yesterday, contusion EXAM: RIGHT HAND - 2 VIEW; RIGHT WRIST - 2 VIEW COMPARISON:  None. FINDINGS: Right hand: Frontal and lateral views are obtained. There are no acute displaced fractures. Diffuse interphalangeal joint space narrowing consistent with osteoarthritis. Soft tissues are unremarkable. Right wrist: Frontal, oblique, lateral views demonstrate a small fracture off the dorsal margin of the triquetrum. There is dorsal soft tissue swelling. Alignment is anatomic. Mild osteoarthritis at the carpometacarpal joints. IMPRESSION: 1. Small fracture off the dorsal margin of the triquetrum, with overlying soft tissue swelling. 2. Diffuse osteoarthritis. Electronically Signed   By: Randa Ngo M.D.   On: 09/03/2020 15:08   MR BREAST BILATERAL W WO CONTRAST INC CAD  Result Date: 09/17/2020 CLINICAL DATA:  70 year old female presenting for staging MRI status post recent diagnosis left breast invasive ductal carcinoma with ultrasound-guided biopsy performed 08/19/2020. She also had a benign biopsy of the right breast performed the same day indicating fibrocystic change with usual ductal hyperplasia. LABS:  Creatinine of 0.94 mg/dL and GFR of greater than 60 on 09/02/2020. EXAM: BILATERAL BREAST MRI WITH AND WITHOUT CONTRAST TECHNIQUE: Multiplanar, multisequence MR images of both breasts were obtained prior to and following the intravenous administration of 10 ml of Gadavist Three-dimensional MR images were rendered by post-processing of the original MR data on an independent workstation. The three-dimensional MR images were interpreted, and findings are  reported in the following complete MRI report for this study. Three dimensional images were evaluated at the independent interpreting workstation using the DynaCAD thin client. COMPARISON:  No prior MRI available for comparison. Correlation made with prior mammogram and ultrasound images. FINDINGS: Breast composition: b. Scattered fibroglandular tissue. Background parenchymal enhancement: Mild. There are multiple bilateral foci of enhancement contributing to this background parenchymal enhancement. Right breast: Susceptibility artifact from the patient's of benign right breast biopsy is seen in the retroareolar right breast. No suspicious enhancement is seen surrounding the biopsy marker. No mass or abnormal enhancement. Left breast: In the upper-outer quadrant of the left breast, middle depth, there is an area of clumped enhancing masses with some surrounding suspicious non mass enhancement, all together measuring 2.4 x 1.3 x 2.2 cm (series 6, image 81). No additional masses or abnormal enhancement. Lymph nodes: No abnormal appearing lymph nodes. Ancillary findings:  None. IMPRESSION: 1. There are clumped masses with mild surrounding non mass enhancement at the site of bright biopsy-proven malignancy in the upper-outer left breast measuring 2.4 cm. No evidence of additional sites of malignancy in the left breast. 2.  No evidence of right breast malignancy. RECOMMENDATION: Continue treatment plan. BI-RADS CATEGORY  6: Known biopsy-proven malignancy. Electronically Signed   By: Ammie Ferrier M.D.   On: 09/17/2020 12:02       IMPRESSION/PLAN: 1. Stage IA, cT1cN0M0 grade 2, ER/PR positive invasive ductal carcinoma of the left breast. Dr. Lisbeth Renshaw discusses the pathology findings and reviews the nature of left breast disease. The consensus from the breast conference includes breast conservation with lumpectomy with sentinel node biopsy. Dr. Eyvonne Left anticipates Oncotype Dx score to determine a role for systemic  therapy. Provided that chemotherapy is not indicated, the patient's course would then be followed by external radiotherapy to the breast  to reduce risks of local recurrence followed by antiestrogen therapy. We discussed the risks, benefits, short, and long term effects of radiotherapy, as  well as the curative intent, and the patient is interested in proceeding. Dr. Lisbeth Renshaw discusses the delivery and logistics of radiotherapy and anticipates a course of 4 or 6 1/2 weeks of radiotherapy. We will see her back a few weeks after surgery to discuss the simulation process and anticipate we starting radiotherapy about 4-6 weeks after surgery.   In a visit lasting 60 minutes, greater than 50% of the time was spent face to face reviewing her case, as well as in preparation of, discussing, and coordinating the patient's care.  The above documentation reflects my direct findings during this shared patient visit. Please see the separate note by Dr. Lisbeth Renshaw on this date for the remainder of the patient's plan of care.    Carola Rhine, PAC

## 2020-09-22 ENCOUNTER — Encounter: Payer: Self-pay | Admitting: *Deleted

## 2020-09-22 DIAGNOSIS — E1151 Type 2 diabetes mellitus with diabetic peripheral angiopathy without gangrene: Secondary | ICD-10-CM | POA: Diagnosis not present

## 2020-09-22 DIAGNOSIS — B351 Tinea unguium: Secondary | ICD-10-CM | POA: Diagnosis not present

## 2020-09-22 DIAGNOSIS — I739 Peripheral vascular disease, unspecified: Secondary | ICD-10-CM | POA: Diagnosis not present

## 2020-09-22 DIAGNOSIS — M79675 Pain in left toe(s): Secondary | ICD-10-CM | POA: Diagnosis not present

## 2020-09-22 DIAGNOSIS — M79674 Pain in right toe(s): Secondary | ICD-10-CM | POA: Diagnosis not present

## 2020-09-22 DIAGNOSIS — L600 Ingrowing nail: Secondary | ICD-10-CM | POA: Diagnosis not present

## 2020-09-22 DIAGNOSIS — L84 Corns and callosities: Secondary | ICD-10-CM | POA: Diagnosis not present

## 2020-09-22 NOTE — Progress Notes (Signed)
Oncology Nurse Navigator Documentation  Oncology Nurse Navigator Flowsheets 09/22/2020  Abnormal Finding Date -  Confirmed Diagnosis Date -  Diagnosis Status -  Planned Course of Treatment -  Phase of Treatment -  Expected Surgery Date -  Navigator Follow Up Date: 09/30/2020  Navigator Follow Up Reason: Surgery  Navigator Location [facility]  Referral Date to RadOnc/MedOnc -  Navigator Encounter Type MyChart  Patient Visit Type MedOnc  Treatment Phase Pre-Tx/Tx Discussion  Barriers/Navigation Needs Coordination of Care;Education  Education Other  Interventions Education;Psycho-Social Support  Acuity Level 2-Minimal Needs (1-2 Barriers Identified)  Coordination of Care -  Education Method Written  Support Groups/Services Friends and Family  Time Spent with Patient 15

## 2020-09-23 DIAGNOSIS — M961 Postlaminectomy syndrome, not elsewhere classified: Secondary | ICD-10-CM | POA: Diagnosis not present

## 2020-09-23 DIAGNOSIS — Z79891 Long term (current) use of opiate analgesic: Secondary | ICD-10-CM | POA: Diagnosis not present

## 2020-09-23 DIAGNOSIS — G894 Chronic pain syndrome: Secondary | ICD-10-CM | POA: Diagnosis not present

## 2020-09-23 DIAGNOSIS — E1142 Type 2 diabetes mellitus with diabetic polyneuropathy: Secondary | ICD-10-CM | POA: Diagnosis not present

## 2020-09-24 ENCOUNTER — Other Ambulatory Visit: Payer: Self-pay

## 2020-09-24 ENCOUNTER — Encounter (HOSPITAL_BASED_OUTPATIENT_CLINIC_OR_DEPARTMENT_OTHER): Payer: Self-pay | Admitting: General Surgery

## 2020-09-26 ENCOUNTER — Other Ambulatory Visit (HOSPITAL_COMMUNITY)
Admission: RE | Admit: 2020-09-26 | Discharge: 2020-09-26 | Disposition: A | Discharge status: Home / Self Care | Payer: BC Managed Care – PPO | Source: Ambulatory Visit | Attending: General Surgery | Admitting: General Surgery

## 2020-09-26 ENCOUNTER — Other Ambulatory Visit (HOSPITAL_COMMUNITY): Payer: BC Managed Care – PPO

## 2020-09-26 DIAGNOSIS — Z01812 Encounter for preprocedural laboratory examination: Secondary | ICD-10-CM | POA: Diagnosis not present

## 2020-09-26 DIAGNOSIS — Z20822 Contact with and (suspected) exposure to covid-19: Secondary | ICD-10-CM | POA: Diagnosis not present

## 2020-09-26 LAB — SARS CORONAVIRUS 2 (TAT 6-24 HRS): SARS Coronavirus 2: NEGATIVE

## 2020-09-28 ENCOUNTER — Inpatient Hospital Stay (HOSPITAL_COMMUNITY): Admission: RE | Admit: 2020-09-28 | Payer: BC Managed Care – PPO | Source: Ambulatory Visit

## 2020-09-29 ENCOUNTER — Encounter (HOSPITAL_BASED_OUTPATIENT_CLINIC_OR_DEPARTMENT_OTHER)
Admission: RE | Admit: 2020-09-29 | Discharge: 2020-09-29 | Disposition: A | Discharge status: Home / Self Care | Payer: BC Managed Care – PPO | Source: Ambulatory Visit | Attending: General Surgery | Admitting: General Surgery

## 2020-09-29 DIAGNOSIS — C50412 Malignant neoplasm of upper-outer quadrant of left female breast: Secondary | ICD-10-CM | POA: Diagnosis not present

## 2020-09-29 DIAGNOSIS — Z01812 Encounter for preprocedural laboratory examination: Secondary | ICD-10-CM | POA: Diagnosis not present

## 2020-09-29 DIAGNOSIS — Z79899 Other long term (current) drug therapy: Secondary | ICD-10-CM | POA: Diagnosis not present

## 2020-09-29 DIAGNOSIS — Z7984 Long term (current) use of oral hypoglycemic drugs: Secondary | ICD-10-CM | POA: Diagnosis not present

## 2020-09-29 DIAGNOSIS — I10 Essential (primary) hypertension: Secondary | ICD-10-CM | POA: Insufficient documentation

## 2020-09-29 DIAGNOSIS — Z01810 Encounter for preprocedural cardiovascular examination: Secondary | ICD-10-CM | POA: Diagnosis not present

## 2020-09-29 DIAGNOSIS — Z170 Estrogen receptor positive status [ER+]: Secondary | ICD-10-CM | POA: Diagnosis not present

## 2020-09-29 LAB — BASIC METABOLIC PANEL
Anion gap: 11 (ref 5–15)
BUN: 17 mg/dL (ref 8–23)
CO2: 25 mmol/L (ref 22–32)
Calcium: 9.2 mg/dL (ref 8.9–10.3)
Chloride: 101 mmol/L (ref 98–111)
Creatinine, Ser: 0.71 mg/dL (ref 0.44–1.00)
GFR, Estimated: >60 mL/min (ref >60)
Glucose, Bld: 178 mg/dL — ABNORMAL HIGH (ref 70–99)
Potassium: 3.9 mmol/L (ref 3.5–5.1)
Sodium: 137 mmol/L (ref 135–145)

## 2020-09-29 NOTE — Anesthesia Preprocedure Evaluation (Addendum)
Anesthesia Evaluation  Patient identified by MRN, date of birth, ID band Patient awake    Reviewed: Allergy & Precautions, NPO status , Patient's Chart, lab work & pertinent test results, reviewed documented beta blocker date and time   History of Anesthesia Complications (+) PONV and history of anesthetic complications (PONV (nausea only) with last spine surgery in 2019- 6h case @ [facility])  Airway Mallampati: II  TM Distance: >3 FB Neck ROM: Full    Dental no notable dental hx. (+) Teeth Intact, Dental Advisory Given   Pulmonary asthma , sleep apnea ,    Pulmonary exam normal breath sounds clear to auscultation       Cardiovascular hypertension, Pt. on home beta blockers Normal cardiovascular exam Rhythm:Regular Rate:Normal     Neuro/Psych  Headaches, PSYCHIATRIC DISORDERS Anxiety Depression  Neuromuscular disease (peripheral neuropathy)    GI/Hepatic negative GI ROS, Neg liver ROS,   Endo/Other  diabetes, Well Controlled, Type 2, Oral Hypoglycemic AgentsObesity BMI 33 a1c 6.1  Renal/GU negative Renal ROS  negative genitourinary   Musculoskeletal  (+) Arthritis , Osteoarthritis,  Chronic LBP s/p multiple back surgeries, neurostimulator   Abdominal   Peds  Hematology Multiple myeloma 2019, MGUS   Anesthesia Other Findings L breast ca   Chronic LBP and peripheral neuropathic pain- takes morphine 68m BID and norco 7.560moccasionally on top of the morphine for breakthrough pain  Reproductive/Obstetrics negative OB ROS                            Anesthesia Physical Anesthesia Plan  ASA: III  Anesthesia Plan: General and Regional   Post-op Pain Management: GA combined w/ Regional for post-op pain   Induction: Intravenous  PONV Risk Score and Plan: 3 and Ondansetron, Dexamethasone, Midazolam and Treatment may vary due to age or medical condition  Airway Management Planned: LMA  Additional  Equipment: None  Intra-op Plan:   Post-operative Plan: Extubation in OR  Informed Consent: I have reviewed the patients History and Physical, chart, labs and discussed the procedure including the risks, benefits and alternatives for the proposed anesthesia with the patient or authorized representative who has indicated his/her understanding and acceptance.     Dental advisory given  Plan Discussed with: CRNA  Anesthesia Plan Comments:        Anesthesia Quick Evaluation

## 2020-09-29 NOTE — Progress Notes (Signed)

## 2020-09-30 ENCOUNTER — Encounter (HOSPITAL_COMMUNITY)
Admission: RE | Admit: 2020-09-30 | Discharge: 2020-09-30 | Disposition: A | Discharge status: Home / Self Care | Payer: BC Managed Care – PPO | Source: Ambulatory Visit | Attending: General Surgery | Admitting: General Surgery

## 2020-09-30 ENCOUNTER — Encounter (HOSPITAL_BASED_OUTPATIENT_CLINIC_OR_DEPARTMENT_OTHER)
Admission: RE | Disposition: A | Discharge status: Home / Self Care | Payer: Self-pay | Source: Home / Self Care | Attending: General Surgery

## 2020-09-30 ENCOUNTER — Ambulatory Visit (HOSPITAL_BASED_OUTPATIENT_CLINIC_OR_DEPARTMENT_OTHER): Payer: BC Managed Care – PPO | Admitting: Anesthesiology

## 2020-09-30 ENCOUNTER — Other Ambulatory Visit: Payer: Self-pay

## 2020-09-30 ENCOUNTER — Ambulatory Visit (HOSPITAL_BASED_OUTPATIENT_CLINIC_OR_DEPARTMENT_OTHER)
Admission: RE | Admit: 2020-09-30 | Discharge: 2020-09-30 | Disposition: A | Discharge status: Home / Self Care | Payer: BC Managed Care – PPO | Attending: General Surgery | Admitting: General Surgery

## 2020-09-30 ENCOUNTER — Encounter (HOSPITAL_BASED_OUTPATIENT_CLINIC_OR_DEPARTMENT_OTHER): Payer: Self-pay | Admitting: General Surgery

## 2020-09-30 DIAGNOSIS — Z170 Estrogen receptor positive status [ER+]: Secondary | ICD-10-CM | POA: Insufficient documentation

## 2020-09-30 DIAGNOSIS — Z01812 Encounter for preprocedural laboratory examination: Secondary | ICD-10-CM | POA: Diagnosis not present

## 2020-09-30 DIAGNOSIS — C50911 Malignant neoplasm of unspecified site of right female breast: Secondary | ICD-10-CM | POA: Diagnosis not present

## 2020-09-30 DIAGNOSIS — C50912 Malignant neoplasm of unspecified site of left female breast: Secondary | ICD-10-CM | POA: Diagnosis not present

## 2020-09-30 DIAGNOSIS — I10 Essential (primary) hypertension: Secondary | ICD-10-CM | POA: Diagnosis not present

## 2020-09-30 DIAGNOSIS — Z79899 Other long term (current) drug therapy: Secondary | ICD-10-CM | POA: Insufficient documentation

## 2020-09-30 DIAGNOSIS — C50412 Malignant neoplasm of upper-outer quadrant of left female breast: Secondary | ICD-10-CM | POA: Insufficient documentation

## 2020-09-30 DIAGNOSIS — G8918 Other acute postprocedural pain: Secondary | ICD-10-CM | POA: Diagnosis not present

## 2020-09-30 DIAGNOSIS — Z01810 Encounter for preprocedural cardiovascular examination: Secondary | ICD-10-CM | POA: Diagnosis not present

## 2020-09-30 DIAGNOSIS — Z7984 Long term (current) use of oral hypoglycemic drugs: Secondary | ICD-10-CM | POA: Insufficient documentation

## 2020-09-30 HISTORY — DX: Other specified postprocedural states: Z98.890

## 2020-09-30 HISTORY — DX: Anxiety disorder, unspecified: F41.9

## 2020-09-30 HISTORY — PX: BREAST LUMPECTOMY WITH RADIOACTIVE SEED AND SENTINEL LYMPH NODE BIOPSY: SHX6550

## 2020-09-30 HISTORY — DX: Dizziness and giddiness: R42

## 2020-09-30 HISTORY — DX: Sleep apnea, unspecified: G47.30

## 2020-09-30 HISTORY — DX: Other specified postprocedural states: R11.2

## 2020-09-30 HISTORY — DX: Depression, unspecified: F32.A

## 2020-09-30 LAB — GLUCOSE, CAPILLARY
Glucose-Capillary: 101 mg/dL — ABNORMAL HIGH (ref 70–99)
Glucose-Capillary: 111 mg/dL — ABNORMAL HIGH (ref 70–99)

## 2020-09-30 SURGERY — BREAST LUMPECTOMY WITH RADIOACTIVE SEED AND SENTINEL LYMPH NODE BIOPSY
Anesthesia: Regional | Site: Breast | Laterality: Left

## 2020-09-30 MED ORDER — BUPIVACAINE HCL (PF) 0.5 % IJ SOLN
INTRAMUSCULAR | Status: DC | PRN
  Administered 2020-09-30: 20 mL via PERINEURAL

## 2020-09-30 MED ORDER — PROPOFOL 10 MG/ML IV BOLUS
INTRAVENOUS | Status: AC
  Filled 2020-09-30: qty 40

## 2020-09-30 MED ORDER — ACETAMINOPHEN 500 MG PO TABS
1000.0000 mg | ORAL_TABLET | Freq: Once | ORAL | Status: AC
  Administered 2020-09-30: 1000 mg via ORAL

## 2020-09-30 MED ORDER — PHENYLEPHRINE 40 MCG/ML (10ML) SYRINGE FOR IV PUSH (FOR BLOOD PRESSURE SUPPORT)
PREFILLED_SYRINGE | INTRAVENOUS | Status: AC
  Filled 2020-09-30: qty 20

## 2020-09-30 MED ORDER — OXYCODONE HCL 5 MG PO TABS: 5.0000 mg | ORAL_TABLET | Freq: Once | ORAL | Status: DC | PRN

## 2020-09-30 MED ORDER — LIDOCAINE 2% (20 MG/ML) 5 ML SYRINGE
INTRAMUSCULAR | Status: DC | PRN
  Administered 2020-09-30: 100 mg via INTRAVENOUS

## 2020-09-30 MED ORDER — OXYCODONE HCL 5 MG/5ML PO SOLN: 5.0000 mg | Freq: Once | ORAL | Status: DC | PRN

## 2020-09-30 MED ORDER — 0.9 % SODIUM CHLORIDE (POUR BTL) OPTIME
TOPICAL | Status: DC | PRN
  Administered 2020-09-30: 1000 mL

## 2020-09-30 MED ORDER — FENTANYL CITRATE (PF) 100 MCG/2ML IJ SOLN
50.0000 ug | Freq: Once | INTRAMUSCULAR | Status: AC
  Administered 2020-09-30: 100 ug via INTRAVENOUS

## 2020-09-30 MED ORDER — GABAPENTIN 300 MG PO CAPS
ORAL_CAPSULE | ORAL | Status: AC
  Filled 2020-09-30: qty 1

## 2020-09-30 MED ORDER — ONDANSETRON HCL 4 MG/2ML IJ SOLN: 4.0000 mg | Freq: Once | INTRAMUSCULAR | Status: DC | PRN

## 2020-09-30 MED ORDER — FENTANYL CITRATE (PF) 100 MCG/2ML IJ SOLN
INTRAMUSCULAR | Status: AC
  Filled 2020-09-30: qty 2

## 2020-09-30 MED ORDER — MIDAZOLAM HCL 2 MG/2ML IJ SOLN
1.0000 mg | Freq: Once | INTRAMUSCULAR | Status: AC
  Administered 2020-09-30: 2 mg via INTRAVENOUS

## 2020-09-30 MED ORDER — MIDAZOLAM HCL 2 MG/2ML IJ SOLN
INTRAMUSCULAR | Status: AC
  Filled 2020-09-30: qty 2

## 2020-09-30 MED ORDER — BUPIVACAINE-EPINEPHRINE 0.25% -1:200000 IJ SOLN
INTRAMUSCULAR | Status: DC | PRN
  Administered 2020-09-30: 24 mL

## 2020-09-30 MED ORDER — ONDANSETRON HCL 4 MG/2ML IJ SOLN
INTRAMUSCULAR | Status: DC | PRN
  Administered 2020-09-30: 4 mg via INTRAVENOUS

## 2020-09-30 MED ORDER — CHLORHEXIDINE GLUCONATE CLOTH 2 % EX PADS: 6.0000 | MEDICATED_PAD | Freq: Once | CUTANEOUS | Status: DC

## 2020-09-30 MED ORDER — PHENYLEPHRINE 40 MCG/ML (10ML) SYRINGE FOR IV PUSH (FOR BLOOD PRESSURE SUPPORT)
PREFILLED_SYRINGE | INTRAVENOUS | Status: DC | PRN
  Administered 2020-09-30 (×2): 80 ug via INTRAVENOUS

## 2020-09-30 MED ORDER — CEFAZOLIN SODIUM-DEXTROSE 2-4 GM/100ML-% IV SOLN
2.0000 g | INTRAVENOUS | Status: AC
  Administered 2020-09-30: 2 g via INTRAVENOUS

## 2020-09-30 MED ORDER — CELECOXIB 200 MG PO CAPS
ORAL_CAPSULE | ORAL | Status: AC
  Filled 2020-09-30: qty 1

## 2020-09-30 MED ORDER — LIDOCAINE 2% (20 MG/ML) 5 ML SYRINGE
INTRAMUSCULAR | Status: AC
  Filled 2020-09-30: qty 5

## 2020-09-30 MED ORDER — LIDOCAINE 2% (20 MG/ML) 5 ML SYRINGE
INTRAMUSCULAR | Status: AC
  Filled 2020-09-30: qty 10

## 2020-09-30 MED ORDER — CEFAZOLIN SODIUM-DEXTROSE 2-4 GM/100ML-% IV SOLN
INTRAVENOUS | Status: AC
  Filled 2020-09-30: qty 100

## 2020-09-30 MED ORDER — LACTATED RINGERS IV SOLN: INTRAVENOUS | Status: DC

## 2020-09-30 MED ORDER — HYDROMORPHONE HCL 1 MG/ML IJ SOLN: 0.2500 mg | INTRAMUSCULAR | Status: DC | PRN

## 2020-09-30 MED ORDER — DEXAMETHASONE SODIUM PHOSPHATE 10 MG/ML IJ SOLN
INTRAMUSCULAR | Status: DC | PRN
  Administered 2020-09-30: 10 mg via INTRAVENOUS

## 2020-09-30 MED ORDER — GABAPENTIN 300 MG PO CAPS
300.0000 mg | ORAL_CAPSULE | ORAL | Status: AC
  Administered 2020-09-30: 300 mg via ORAL

## 2020-09-30 MED ORDER — SCOPOLAMINE 1 MG/3DAYS TD PT72
1.0000 | MEDICATED_PATCH | TRANSDERMAL | Status: DC
  Administered 2020-09-30: 1.5 mg via TRANSDERMAL

## 2020-09-30 MED ORDER — CELECOXIB 200 MG PO CAPS
200.0000 mg | ORAL_CAPSULE | ORAL | Status: AC
  Administered 2020-09-30: 200 mg via ORAL

## 2020-09-30 MED ORDER — BUPIVACAINE LIPOSOME 1.3 % IJ SUSP
INTRAMUSCULAR | Status: DC | PRN
  Administered 2020-09-30: 10 mL via PERINEURAL

## 2020-09-30 MED ORDER — ACETAMINOPHEN 500 MG PO TABS
ORAL_TABLET | ORAL | Status: AC
  Filled 2020-09-30: qty 2

## 2020-09-30 MED ORDER — SCOPOLAMINE 1 MG/3DAYS TD PT72
MEDICATED_PATCH | TRANSDERMAL | Status: AC
  Filled 2020-09-30: qty 1

## 2020-09-30 MED ORDER — PROPOFOL 10 MG/ML IV BOLUS
INTRAVENOUS | Status: DC | PRN
  Administered 2020-09-30: 200 mg via INTRAVENOUS

## 2020-09-30 MED ORDER — ONDANSETRON HCL 4 MG/2ML IJ SOLN
INTRAMUSCULAR | Status: AC
  Filled 2020-09-30: qty 2

## 2020-09-30 MED ORDER — TECHNETIUM TC 99M TILMANOCEPT KIT
1.0000 | PACK | Freq: Once | INTRAVENOUS | Status: AC | PRN
  Administered 2020-09-30: 1 via INTRADERMAL

## 2020-09-30 MED ORDER — ACETAMINOPHEN 500 MG PO TABS: 1000.0000 mg | ORAL_TABLET | ORAL | Status: DC

## 2020-09-30 MED ORDER — HYDROCODONE-ACETAMINOPHEN 5-325 MG PO TABS: 1.0000 | ORAL_TABLET | Freq: Four times a day (QID) | ORAL | 0 refills | Status: DC | PRN

## 2020-09-30 SURGICAL SUPPLY — 46 items
ADH SKN CLS APL DERMABOND .7 (GAUZE/BANDAGES/DRESSINGS) ×1
APL PRP STRL LF DISP 70% ISPRP (MISCELLANEOUS) ×1
APPLIER CLIP 9.375 MED OPEN (MISCELLANEOUS) ×2
APR CLP MED 9.3 20 MLT OPN (MISCELLANEOUS) ×1
BLADE SURG 15 STRL LF DISP TIS (BLADE) ×1 IMPLANT
BLADE SURG 15 STRL SS (BLADE) ×2
CANISTER SUC SOCK COL 7IN (MISCELLANEOUS) IMPLANT
CANISTER SUCT 1200ML W/VALVE (MISCELLANEOUS) IMPLANT
CHLORAPREP W/TINT 26 (MISCELLANEOUS) ×2 IMPLANT
CLIP APPLIE 9.375 MED OPEN (MISCELLANEOUS) ×1 IMPLANT
COVER BACK TABLE 60X90IN (DRAPES) ×2 IMPLANT
COVER MAYO STAND STRL (DRAPES) ×2 IMPLANT
COVER PROBE W GEL 5X96 (DRAPES) ×2 IMPLANT
COVER WAND RF STERILE (DRAPES) IMPLANT
DECANTER SPIKE VIAL GLASS SM (MISCELLANEOUS) IMPLANT
DERMABOND ADVANCED (GAUZE/BANDAGES/DRESSINGS) ×1
DERMABOND ADVANCED .7 DNX12 (GAUZE/BANDAGES/DRESSINGS) ×1 IMPLANT
DRAPE LAPAROSCOPIC ABDOMINAL (DRAPES) ×2 IMPLANT
DRAPE UTILITY XL STRL (DRAPES) ×2 IMPLANT
ELECT COATED BLADE 2.86 ST (ELECTRODE) ×2 IMPLANT
ELECT REM PT RETURN 9FT ADLT (ELECTROSURGICAL) ×2
ELECTRODE REM PT RTRN 9FT ADLT (ELECTROSURGICAL) ×1 IMPLANT
GLOVE BIO SURGEON STRL SZ7.5 (GLOVE) ×2 IMPLANT
GOWN STRL REUS W/ TWL LRG LVL3 (GOWN DISPOSABLE) ×2 IMPLANT
GOWN STRL REUS W/TWL LRG LVL3 (GOWN DISPOSABLE) ×4
ILLUMINATOR WAVEGUIDE N/F (MISCELLANEOUS) IMPLANT
KIT MARKER MARGIN INK (KITS) ×2 IMPLANT
LIGHT WAVEGUIDE WIDE FLAT (MISCELLANEOUS) IMPLANT
NDL HYPO 25X1 1.5 SAFETY (NEEDLE) ×1 IMPLANT
NDL SAFETY ECLIPSE 18X1.5 (NEEDLE) IMPLANT
NEEDLE HYPO 18GX1.5 SHARP (NEEDLE)
NEEDLE HYPO 25X1 1.5 SAFETY (NEEDLE) ×2 IMPLANT
NS IRRIG 1000ML POUR BTL (IV SOLUTION) IMPLANT
PACK BASIN DAY SURGERY FS (CUSTOM PROCEDURE TRAY) ×2 IMPLANT
PENCIL SMOKE EVACUATOR (MISCELLANEOUS) ×2 IMPLANT
RETRACTOR ONETRAX LX 90X20 (MISCELLANEOUS) ×1 IMPLANT
SLEEVE SCD COMPRESS KNEE MED (MISCELLANEOUS) ×2 IMPLANT
SPONGE LAP 18X18 RF (DISPOSABLE) ×2 IMPLANT
SUT MON AB 4-0 PC3 18 (SUTURE) ×4 IMPLANT
SUT SILK 2 0 SH (SUTURE) IMPLANT
SUT VICRYL 3-0 CR8 SH (SUTURE) ×2 IMPLANT
SYR CONTROL 10ML LL (SYRINGE) ×2 IMPLANT
TOWEL GREEN STERILE FF (TOWEL DISPOSABLE) ×2 IMPLANT
TRAY FAXITRON CT DISP (TRAY / TRAY PROCEDURE) ×2 IMPLANT
TUBE CONNECTING 20X1/4 (TUBING) IMPLANT
YANKAUER SUCT BULB TIP NO VENT (SUCTIONS) IMPLANT

## 2020-09-30 NOTE — Interval H&P Note (Signed)
History and Physical Interval Note:  09/30/2020 2:54 PM  [name]  has presented today for surgery, with the diagnosis of LEFT BREAST CANCER.  The various methods of treatment have been discussed with the patient and family. After consideration of risks, benefits and other options for treatment, the patient has consented to  Procedure(s): LEFT BREAST LUMPECTOMY WITH RADIOACTIVE SEED AND SENTINEL LYMPH NODE BIOPSY (Left) as a surgical intervention.  The patient's history has been reviewed, patient examined, no change in status, stable for surgery.  I have reviewed the patient's chart and labs.  Questions were answered to the patient's satisfaction.     [name]

## 2020-09-30 NOTE — Transfer of Care (Signed)
Immediate Anesthesia Transfer of Care Note  Patient: [name]  Procedure(s) Performed: RADIOACTIVE SEED GUIDED LEFT BREAST LUMPECTOMY, LEFT AXILLARY SENTINEL LYMPH NODE BIOPSY (Left Breast)  Patient Location: PACU  Anesthesia Type:GA combined with regional for post-op pain  Level of Consciousness: drowsy and patient cooperative  Airway & Oxygen Therapy: Patient Spontanous Breathing and Patient connected to face mask oxygen  Post-op Assessment: Report given to RN and Post -op Vital signs reviewed and stable  Post vital signs: Reviewed and stable  Last Vitals:  Vitals Value Taken Time  BP 109/59 09/30/20 1636  Temp    Pulse 65 09/30/20 1638  Resp 6 09/30/20 1638  SpO2 97 % 09/30/20 1638  Vitals shown include unvalidated device data.  Last Pain:  Vitals:   09/30/20 1401  TempSrc: Oral  PainSc: 5       Patients Stated Pain Goal: 5 (94/76/54 6503)  Complications: No complications documented.

## 2020-09-30 NOTE — Discharge Instructions (Signed)
  Post Anesthesia Home Care Instructions  Activity: Get plenty of rest for the remainder of the day. A responsible individual must stay with you for 24 hours following the procedure.  For the next 24 hours, DO NOT: -Drive a car -Paediatric nurse -Drink alcoholic beverages -Take any medication unless instructed by your physician -Make any legal decisions or sign important papers.  Meals: Start with liquid foods such as gelatin or soup. Progress to regular foods as tolerated. Avoid greasy, spicy, heavy foods. If nausea and/or vomiting occur, drink only clear liquids until the nausea and/or vomiting subsides. Call your physician if vomiting continues.  Special Instructions/Symptoms: Your throat may feel dry or sore from the anesthesia or the breathing tube placed in your throat during surgery. If this causes discomfort, gargle with warm salt water. The discomfort should disappear within 24 hours.  If you had a scopolamine patch placed behind your ear for the management of post- operative nausea and/or vomiting:  1. The medication in the patch is effective for 72 hours, after which it should be removed.  Wrap patch in a tissue and discard in the trash. Wash hands thoroughly with soap and water. 2. You may remove the patch earlier than 72 hours if you experience unpleasant side effects which may include dry mouth, dizziness or visual disturbances. 3. Avoid touching the patch. Wash your hands with soap and water after contact with the patch.    Information for Discharge Teaching: EXPAREL (bupivacaine liposome injectable suspension)   Your surgeon or anesthesiologist gave you EXPAREL(bupivacaine) to help control your pain after surgery.   EXPAREL is a local anesthetic that provides pain relief by numbing the tissue around the surgical site.  EXPAREL is designed to release pain medication over time and can control pain for up to 72 hours.  Depending on how you respond to EXPAREL, you may  require less pain medication during your recovery.  Possible side effects:  Temporary loss of sensation or ability to move in the area where bupivacaine was injected.  Nausea, vomiting, constipation  Rarely, numbness and tingling in your mouth or lips, lightheadedness, or anxiety may occur.  Call your doctor right away if you think you may be experiencing any of these sensations, or if you have other questions regarding possible side effects.  Follow all other discharge instructions given to you by your surgeon or nurse. Eat a healthy diet and drink plenty of water or other fluids.  If you return to the hospital for any reason within 96 hours following the administration of EXPAREL, it is important for health care providers to know that you have received this anesthetic. A teal colored band has been placed on your arm with the date, time and amount of EXPAREL you have received in order to alert and inform your health care providers. Please leave this armband in place for the full 96 hours following administration, and then you may remove the band.  Next dose of Tylenol at 8:00pm. Next dose of Ibuprofen at 8:00pm.

## 2020-09-30 NOTE — Anesthesia Procedure Notes (Signed)
Anesthesia Regional Block: Pectoralis block   Pre-Anesthetic Checklist: ,, timeout performed, Correct Patient, Correct Site, Correct Laterality, Correct Procedure, Correct Position, site marked, Risks and benefits discussed,  Surgical consent,  Pre-op evaluation,  At surgeon's request and post-op pain management  Laterality: Left  Prep: Maximum Sterile Barrier Precautions used, chloraprep       Needles:  Injection technique: Single-shot  Needle Type: Echogenic Stimulator Needle     Needle Length: 9cm  Needle Gauge: 22     Additional Needles:   Procedures:,,,, ultrasound used (permanent image in chart),,,,  Narrative:  Start time: 09/30/2020 2:10 PM End time: 09/30/2020 2:20 PM Injection made incrementally with aspirations every 5 mL.  Performed by: Personally  Anesthesiologist: Pervis Hocking, DO  Additional Notes: Monitors applied. No increased pain on injection. No increased resistance to injection. Injection made in 5cc increments. Good needle visualization. Patient tolerated procedure well.

## 2020-09-30 NOTE — Anesthesia Procedure Notes (Signed)
Procedure Name: LMA Insertion Date/Time: 09/30/2020 3:18 PM Performed by: Genelle Bal, CRNA Pre-anesthesia Checklist: Patient identified, Emergency Drugs available, Suction available and Patient being monitored Patient Re-evaluated:Patient Re-evaluated prior to induction Oxygen Delivery Method: Circle system utilized Preoxygenation: Pre-oxygenation with 100% oxygen Induction Type: IV induction Ventilation: Mask ventilation without difficulty LMA: LMA inserted LMA Size: 4.0 Number of attempts: 1 Airway Equipment and Method: Bite block Placement Confirmation: positive ETCO2 Tube secured with: Tape Dental Injury: Teeth and Oropharynx as per pre-operative assessment

## 2020-09-30 NOTE — Progress Notes (Signed)
Assisted Dr. Doroteo Glassman with left, ultrasound guided, pectoralis block. Side rails up, monitors on throughout procedure. See vital signs in flow sheet. Tolerated Procedure well.

## 2020-09-30 NOTE — Anesthesia Postprocedure Evaluation (Signed)
Anesthesia Post Note  Patient: Cassandre J Marcantonio  Procedure(s) Performed: RADIOACTIVE SEED GUIDED LEFT BREAST LUMPECTOMY, LEFT AXILLARY SENTINEL LYMPH NODE BIOPSY (Left Breast)     Patient location during evaluation: Phase II Anesthesia Type: Regional and General Level of consciousness: awake and alert, oriented and patient cooperative Pain management: pain level controlled Vital Signs Assessment: post-procedure vital signs reviewed and stable Respiratory status: spontaneous breathing, nonlabored ventilation and respiratory function stable Cardiovascular status: blood pressure returned to baseline and stable Postop Assessment: no apparent nausea or vomiting Anesthetic complications: no   No complications documented.  Last Vitals:  Vitals:   09/30/20 1707 09/30/20 1715  BP:  133/71  Pulse:  76  Resp:  19  Temp:    SpO2: 96% 100%    Last Pain:  Vitals:   09/30/20 1700  TempSrc:   PainSc: [place]

## 2020-09-30 NOTE — H&P (Signed)
[name]  Location: [facility] Patient #: [id number] DOB: June 04, 1951 Married / Language: English / Race: White Female   History of Present Illness  The patient is a 70 year old female who presents with breast cancer. We are asked to see the patient in consultation by Dr. Gae Dry to evaluate her for a new left breast cancer. The patient is a 70 year old white female who recently went for a routine screening mammogram. At that time she was found to have small abnormalities in both breasts. The right breast was biopsied and came back benign. The upper outer quadrant left breast abnormality measured 9 mm. This was biopsied and came back as an invasive breast cancer with clinically negative nodes. The cancer was ER/PR positive and HER-2 negative with a Ki-67 of 2%. She does not smoke. She has no significant family history of breast cancer.   Past Surgical History Breast Biopsy  Right. Cesarean Section - Multiple  Colon Polyp Removal - Colonoscopy  Spinal Surgery - Lower Back  Spinal Surgery - Neck  Tonsillectomy  Ventral / Umbilical Hernia Surgery  Bilateral.  Diagnostic Studies History  Colonoscopy  5-10 years ago Mammogram  within last year Pap Smear  1-5 years ago  Allergies  No Known Drug Allergies   Medication History  HYDROcodone-Acetaminophen (10-325MG Tablet, Oral) Active. Morphine Sulfate ER (60MG Tablet ER, Oral) Active. metFORMIN HCl ER (500MG Tablet ER 24HR, Oral) Active. Metoprolol Succinate ER (100MG Tablet ER 24HR, Oral) Active. Pravastatin Sodium (40MG Tablet, Oral) Active. Venlafaxine HCl ER (150MG Capsule ER 24HR, Oral) Active. Medications Reconciled  Social History  Caffeine use  Carbonated beverages. No alcohol use  No drug use  Tobacco use  Never smoker.  Family History Alcohol Abuse  Father, Son. Cancer  Father. Cerebrovascular Accident  Sister. Depression  Mother, Sister. Heart Disease   Mother, Sister. Migraine Headache  Daughter.  Pregnancy / Birth History  Age at menarche  41 years. Age of menopause  [age] Contraceptive History  Oral contraceptives. Gravida  3 Irregular periods  Maternal age  36-35 Para  2  Other Problems  Arthritis  Atrial Fibrillation  Back Pain  Depression  Diabetes Mellitus  Heart murmur  High blood pressure  Hypercholesterolemia  Lump In Breast  Migraine Headache  Other disease, cancer, significant illness  Umbilical Hernia Repair     Review of Systems  General Present- Fatigue. Not Present- Appetite Loss, Chills, Fever, Night Sweats, Weight Gain and Weight Loss. Skin Present- Dryness. Not Present- Change in Wart/Mole, Hives, Jaundice, New Lesions, Non-Healing Wounds, Rash and Ulcer. HEENT Present- Hearing Loss, Ringing in the Ears, Seasonal Allergies and Sinus Pain. Not Present- Earache, Hoarseness, Nose Bleed, Oral Ulcers, Sore Throat, Visual Disturbances, Wears glasses/contact lenses and Yellow Eyes. Respiratory Present- Snoring. Not Present- Bloody sputum, Chronic Cough, Difficulty Breathing and Wheezing. Breast Not Present- Breast Mass, Breast Pain, Nipple Discharge and Skin Changes. Cardiovascular Present- Swelling of Extremities. Not Present- Chest Pain, Difficulty Breathing Lying Down, Leg Cramps, Palpitations, Rapid Heart Rate and Shortness of Breath. Gastrointestinal Not Present- Abdominal Pain, Bloating, Bloody Stool, Change in Bowel Habits, Chronic diarrhea, Constipation, Difficulty Swallowing, Excessive gas, Gets full quickly at meals, Hemorrhoids, Indigestion, Nausea, Rectal Pain and Vomiting. Female Genitourinary Not Present- Frequency, Nocturia, Painful Urination, Pelvic Pain and Urgency. Musculoskeletal Present- Back Pain, Joint Stiffness and Muscle Weakness. Not Present- Joint Pain, Muscle Pain and Swelling of Extremities. Neurological Present- Headaches, Numbness and Trouble walking. Not Present-  Decreased Memory, Fainting, Seizures, Tingling, Tremor and Weakness.  Psychiatric Present- Change in Sleep Pattern and Depression. Not Present- Anxiety, Bipolar, Fearful and Frequent crying. Endocrine Present- New Diabetes. Not Present- Cold Intolerance, Excessive Hunger, Hair Changes, Heat Intolerance and Hot flashes. Hematology Not Present- Blood Thinners, Easy Bruising, Excessive bleeding, Gland problems, HIV and Persistent Infections.  Vitals  Weight: 204.2 lb Height: 66in Body Surface Area: 2.02 m Body Mass Index: 32.96 kg/m  Temp.: 97.46F  Pulse: 94 (Regular)  BP: 126/86(Sitting, Left Arm, Standard)       Physical Exam  General Mental Status-Alert. General Appearance-Consistent with stated age. Hydration-Well hydrated. Voice-Normal.  Head and Neck Head-normocephalic, atraumatic with no lesions or palpable masses. Trachea-midline. Thyroid Gland Characteristics - normal size and consistency.  Eye Eyeball - Bilateral-Extraocular movements intact. Sclera/Conjunctiva - Bilateral-No scleral icterus.  Chest and Lung Exam Chest and lung exam reveals -quiet, even and easy respiratory effort with no use of accessory muscles and on auscultation, normal breath sounds, no adventitious sounds and normal vocal resonance. Inspection Chest Wall - Normal. Back - normal.  Breast Note: There is no palpable mass in either breast. There is no palpable axillary, supraclavicular, or cervical lymphadenopathy.   Cardiovascular Cardiovascular examination reveals -normal heart sounds, regular rate and rhythm with no murmurs and normal pedal pulses bilaterally.  Abdomen Inspection Inspection of the abdomen reveals - No Hernias. Skin - Scar - no surgical scars. Palpation/Percussion Palpation and Percussion of the abdomen reveal - Soft, Non Tender, No Rebound tenderness, No Rigidity (guarding) and No hepatosplenomegaly. Auscultation Auscultation of the  abdomen reveals - Bowel sounds normal.  Neurologic Neurologic evaluation reveals -alert and oriented x 3 with no impairment of recent or remote memory. Mental Status-Normal.  Musculoskeletal Normal Exam - Left-Upper Extremity Strength Normal and Lower Extremity Strength Normal. Normal Exam - Right-Upper Extremity Strength Normal and Lower Extremity Strength Normal.  Lymphatic Head & Neck  General Head & Neck Lymphatics: Bilateral - Description - Normal. Axillary  General Axillary Region: Bilateral - Description - Normal. Tenderness - Non Tender. Femoral & Inguinal  Generalized Femoral & Inguinal Lymphatics: Bilateral - Description - Normal. Tenderness - Non Tender.    Assessment & Plan  MALIGNANT NEOPLASM OF UPPER-OUTER QUADRANT OF LEFT BREAST IN FEMALE, ESTROGEN RECEPTOR POSITIVE (C50.412) Impression: The patient appears to have a small stage I cancer in the upper outer quadrant of the left breast. I have discussed with her in detail the options for treatment and at this point she favors breast conservation which I feel is a very reasonable way of treating her cancer. She is also a candidate for sentinel node biopsy. I have discussed with her in detail the risks and benefits of the operation as well as some of the technical aspects including the use of a radioactive seed for localization and she understands and wishes to proceed. I will go ahead and refer her to medical and radiation oncology to discuss adjuvant therapy. I will also send her for preoperative lymphedema testing. She already sees Dr. Marin Olp. This patient encounter took 60 minutes today to perform the following: take history, perform exam, review outside records, interpret imaging, counsel the patient on their diagnosis and document encounter, findings & plan in the EHR Current Plans Referred to Oncology, for evaluation and follow up (Oncology). Routine. Referred to Physical Therapy, for evaluation and follow up  (Physical Therapy). Routine.

## 2020-09-30 NOTE — Op Note (Signed)
09/30/2020  4:32 PM  PATIENT:  [name]  70 y.o. female  PRE-OPERATIVE DIAGNOSIS:  LEFT BREAST CANCER  POST-OPERATIVE DIAGNOSIS:  LEFT BREAST CANCER  PROCEDURE:  Procedure(s): RADIOACTIVE SEED GUIDED LEFT BREAST LUMPECTOMY, DEEP LEFT AXILLARY SENTINEL LYMPH NODE BIOPSY (Left)  SURGEON:  Surgeon(s) and Role:    * Jovita Kussmaul, MD - Primary  PHYSICIAN ASSISTANT:   ASSISTANTS: Dr. Manus Gunning   ANESTHESIA:   local and general  EBL:  10 mL   BLOOD ADMINISTERED:none  DRAINS: none   LOCAL MEDICATIONS USED:  MARCAINE     SPECIMEN:  Source of Specimen:  right breast tissue with additiional medial, inferior, and deep margins and sentinel nodes x 3  DISPOSITION OF SPECIMEN:  PATHOLOGY  COUNTS:  YES  TOURNIQUET:  * No tourniquets in log *  DICTATION: .Dragon Dictation   After informed consent was obtained the patient was brought to the operating room and placed in the supine position on the operating table.  After adequate induction of general anesthesia the patient's left chest, breast, and axillary area were prepped with ChloraPrep, allowed to dry, and draped in usual sterile manner.  An appropriate timeout was performed.  Previously an I-125 seed was placed in the upper outer central left breast to mark an area of invasive breast cancer.  Earlier in the day the patient also underwent injection of 1 mCi of technetium sulfur colloid in the subareolar position on the left.  The neoprobe was first set to technetium.  There was a good signal in the left axilla.  The area was infiltrated with quarter percent Marcaine.  A small transversely oriented incision was made with a 15 blade knife overlying the area of radioactivity.  The incision was carried through the skin and subcutaneous tissue sharply with the electrocautery until the deep left axillary space was entered.  The neoprobe was used to direct blunt hemostat dissection.  We were able to identify 3 hot lymph nodes that were  excised sharply with the electrocautery and the surrounding small vessels and lymphatics were controlled with clips.  Ex vivo counts on these nodes ranged from 300 to 1500.  No other hot or palpable nodes were identified in the left axilla.  Hemostasis was achieved using electrocautery.  The deep layer of the wound was then closed with interrupted 3-0 Vicryl stitches.  The skin was closed with a running 4-0 Monocryl subcuticular stitch.  Attention was then turned to the left breast.  The neoprobe was set to I-125 in the area of radioactivity was readily identified.  The area around this was infiltrated with quarter percent Marcaine.  A curvilinear incision was made along the upper outer edge of the areola of the left breast with a 15 blade knife.  The incision was carried through the skin and subcutaneous tissue sharply with the electrocautery.  Dissection was then carried towards the radioactive seed under the direction of the neoprobe.  Once I more closely approached the radioactive seed I then removed a circular portion of breast tissue sharply with the electrocautery around the radioactive seed while checking the area of radioactivity frequently.  Once the specimen was removed it was oriented with the appropriate paint colors.  A specimen radiograph was obtained that showed the clip and seed to be within the specimen.  I did elect to remove an additional medial, inferior, and deep margin and these were marked appropriately and also sent to pathology.  Hemostasis was achieved using the Bovie electrocautery.  The  wound was irrigated with saline and infiltrated with more quarter percent Marcaine.  The cavity was marked with clips.  The deep layer of the wound was then closed with layers of interrupted 3-0 Vicryl stitches.  The skin was closed with interrupted 4-0 Monocryl subcuticular stitches.  Dermabond dressings were applied.  The patient tolerated the procedure well.  At the end of the case all needle sponge  and instrument counts were correct.  The patient was then awakened and taken to recovery in stable condition.  PLAN OF CARE: Discharge to home after PACU  PATIENT DISPOSITION:  PACU - hemodynamically stable.   Delay start of Pharmacological VTE agent (>24hrs) due to surgical blood loss or risk of bleeding: not applicable

## 2020-09-30 NOTE — Progress Notes (Signed)
Assisted nuc med tech with nuc med injections. Side rails up, monitors on throughout procedure. See vital signs in flow sheet. Tolerated Procedure well. 

## 2020-10-01 ENCOUNTER — Encounter (HOSPITAL_BASED_OUTPATIENT_CLINIC_OR_DEPARTMENT_OTHER): Payer: Self-pay | Admitting: General Surgery

## 2020-10-02 ENCOUNTER — Ambulatory Visit: Payer: BC Managed Care – PPO | Admitting: Hematology & Oncology

## 2020-10-02 ENCOUNTER — Other Ambulatory Visit: Payer: BC Managed Care – PPO

## 2020-10-06 ENCOUNTER — Encounter: Payer: Self-pay | Admitting: *Deleted

## 2020-10-06 LAB — SURGICAL PATHOLOGY

## 2020-10-06 NOTE — Progress Notes (Signed)
Per Dr Antonieta Pert request, Oncotype testing on specimen [id number] DOS 09/30/2020.  Oncology Nurse Navigator Documentation  Oncology Nurse Navigator Flowsheets 10/06/2020  Abnormal Finding Date -  Confirmed Diagnosis Date -  Diagnosis Status -  Planned Course of Treatment -  Phase of Treatment Surgery  Expected Surgery Date -  Surgery Actual Start Date: 09/30/2020  Navigator Follow Up Date: 10/28/2020  Navigator Follow Up Reason: Follow-up Appointment  Navigator Location [facility]  Referral Date to RadOnc/MedOnc -  Navigator Encounter Type Molecular Studies  Patient Visit Type MedOnc  Treatment Phase Active Tx  Barriers/Navigation Needs Coordination of Care;Education  Education -  Interventions Coordination of Care  Acuity Level 2-Minimal Needs (1-2 Barriers Identified)  Coordination of Care Other  Education Method -  Support Groups/Services Friends and Family  Time Spent with Patient 8

## 2020-10-07 ENCOUNTER — Ambulatory Visit: Payer: Self-pay | Admitting: General Surgery

## 2020-10-09 DIAGNOSIS — M25531 Pain in right wrist: Secondary | ICD-10-CM | POA: Diagnosis not present

## 2020-10-13 ENCOUNTER — Other Ambulatory Visit: Payer: Self-pay

## 2020-10-13 ENCOUNTER — Encounter (HOSPITAL_BASED_OUTPATIENT_CLINIC_OR_DEPARTMENT_OTHER): Payer: Self-pay | Admitting: General Surgery

## 2020-10-14 DIAGNOSIS — C50912 Malignant neoplasm of unspecified site of left female breast: Secondary | ICD-10-CM | POA: Diagnosis not present

## 2020-10-14 MED ORDER — CHLORHEXIDINE GLUCONATE CLOTH 2 % EX PADS: 6.0000 | MEDICATED_PAD | Freq: Once | CUTANEOUS | Status: DC

## 2020-10-14 NOTE — Progress Notes (Signed)
      Enhanced Recovery after Surgery for Orthopedics Enhanced Recovery after Surgery is a protocol used to improve the stress on your body and your recovery after surgery.  Patient Instructions  . The night before surgery:  o No food after midnight. ONLY clear liquids after midnight  . The day of surgery (if you do NOT have diabetes):  o Drink ONE (1) Pre-Surgery Clear Ensure as directed.   o This drink was given to you during your hospital  pre-op appointment visit. o The pre-op nurse will instruct you on the time to drink the  Pre-Surgery Ensure depending on your surgery time. o Finish the drink at the designated time by the pre-op nurse.  o Nothing else to drink after completing the  Pre-Surgery Clear Ensure.  . The day of surgery (if you have diabetes): o Drink ONE (1) Gatorade 2 (G2) as directed. o This drink was given to you during your hospital  pre-op appointment visit.  o The pre-op nurse will instruct you on the time to drink the   Gatorade 2 (G2) depending on your surgery time. o Color of the Gatorade may vary. Red is not allowed. o Nothing else to drink after completing the  Gatorade 2 (G2).         If you have questions, please contact your surgeon's office.  Surgical soap given to pt and instructions on how to use it.  Pt verbalized understanding.

## 2020-10-15 ENCOUNTER — Other Ambulatory Visit (HOSPITAL_COMMUNITY)
Admission: RE | Admit: 2020-10-15 | Discharge: 2020-10-15 | Disposition: A | Discharge status: Home / Self Care | Payer: BC Managed Care – PPO | Source: Ambulatory Visit | Attending: General Surgery | Admitting: General Surgery

## 2020-10-15 ENCOUNTER — Encounter (INDEPENDENT_AMBULATORY_CARE_PROVIDER_SITE_OTHER): Payer: Self-pay

## 2020-10-15 DIAGNOSIS — Z01812 Encounter for preprocedural laboratory examination: Secondary | ICD-10-CM | POA: Diagnosis not present

## 2020-10-15 DIAGNOSIS — Z20822 Contact with and (suspected) exposure to covid-19: Secondary | ICD-10-CM | POA: Insufficient documentation

## 2020-10-15 LAB — SARS CORONAVIRUS 2 (TAT 6-24 HRS): SARS Coronavirus 2: NEGATIVE

## 2020-10-16 ENCOUNTER — Encounter: Payer: Self-pay | Admitting: *Deleted

## 2020-10-16 NOTE — Progress Notes (Signed)
Patient will have to return to surgery on 2/7 for a re-excision due to positive margins. Patient would like to know if her f/u appointment with Dr Marin Olp should be pushed back. She also wants to know if we have received her Oncotype results.  Reviewed with Dr Marin Olp and he would like appointment to be pushed back 2 weeks.   Called patient and reviewed new appointment times with her. Also informed her that the Oncotype is in progress, and we have no results at this time.   Oncology Nurse Navigator Documentation  Oncology Nurse Navigator Flowsheets 10/16/2020  Abnormal Finding Date -  Confirmed Diagnosis Date -  Diagnosis Status -  Planned Course of Treatment -  Phase of Treatment -  Expected Surgery Date -  Surgery Actual Start Date: -  Navigator Follow Up Date: 11/11/2020  Navigator Follow Up Reason: Follow-up Appointment  Navigator Location [facility]  Referral Date to RadOnc/MedOnc -  Navigator Encounter Type Telephone  Telephone Appt Confirmation/Clarification;Education;Incoming Call  Patient Visit Type MedOnc  Treatment Phase Active Tx  Barriers/Navigation Needs Coordination of Care;Education  Education Other  Interventions Coordination of Care;Education;Psycho-Social Support  Acuity Level 2-Minimal Needs (1-2 Barriers Identified)  Coordination of Care Appts  Education Method Verbal  Support Groups/Services Friends and Family  Time Spent with Patient 30

## 2020-10-19 ENCOUNTER — Encounter (HOSPITAL_BASED_OUTPATIENT_CLINIC_OR_DEPARTMENT_OTHER): Payer: Self-pay | Admitting: General Surgery

## 2020-10-19 ENCOUNTER — Other Ambulatory Visit: Payer: Self-pay

## 2020-10-19 ENCOUNTER — Ambulatory Visit (HOSPITAL_BASED_OUTPATIENT_CLINIC_OR_DEPARTMENT_OTHER): Payer: BC Managed Care – PPO | Admitting: Certified Registered Nurse Anesthetist

## 2020-10-19 ENCOUNTER — Ambulatory Visit (HOSPITAL_BASED_OUTPATIENT_CLINIC_OR_DEPARTMENT_OTHER)
Admission: RE | Admit: 2020-10-19 | Discharge: 2020-10-19 | Disposition: A | Discharge status: Home / Self Care | Payer: BC Managed Care – PPO | Attending: General Surgery | Admitting: General Surgery

## 2020-10-19 ENCOUNTER — Encounter (HOSPITAL_BASED_OUTPATIENT_CLINIC_OR_DEPARTMENT_OTHER)
Admission: RE | Disposition: A | Discharge status: Home / Self Care | Payer: Self-pay | Source: Home / Self Care | Attending: General Surgery

## 2020-10-19 DIAGNOSIS — N6489 Other specified disorders of breast: Secondary | ICD-10-CM | POA: Diagnosis not present

## 2020-10-19 DIAGNOSIS — I10 Essential (primary) hypertension: Secondary | ICD-10-CM | POA: Diagnosis not present

## 2020-10-19 DIAGNOSIS — C50412 Malignant neoplasm of upper-outer quadrant of left female breast: Secondary | ICD-10-CM | POA: Diagnosis not present

## 2020-10-19 DIAGNOSIS — Z170 Estrogen receptor positive status [ER+]: Secondary | ICD-10-CM | POA: Diagnosis not present

## 2020-10-19 DIAGNOSIS — G4700 Insomnia, unspecified: Secondary | ICD-10-CM | POA: Diagnosis not present

## 2020-10-19 DIAGNOSIS — C50912 Malignant neoplasm of unspecified site of left female breast: Secondary | ICD-10-CM | POA: Diagnosis not present

## 2020-10-19 DIAGNOSIS — D0512 Intraductal carcinoma in situ of left breast: Secondary | ICD-10-CM | POA: Diagnosis not present

## 2020-10-19 DIAGNOSIS — N6012 Diffuse cystic mastopathy of left breast: Secondary | ICD-10-CM | POA: Diagnosis not present

## 2020-10-19 HISTORY — PX: RE-EXCISION OF BREAST CANCER,SUPERIOR MARGINS: SHX6047

## 2020-10-19 LAB — GLUCOSE, CAPILLARY
Glucose-Capillary: 113 mg/dL — ABNORMAL HIGH (ref 70–99)
Glucose-Capillary: 102 mg/dL — ABNORMAL HIGH (ref 70–99)

## 2020-10-19 SURGERY — RE-EXCISION OF BREAST CANCER,SUPERIOR MARGINS
Anesthesia: General | Site: Breast | Laterality: Left

## 2020-10-19 MED ORDER — OXYCODONE HCL 5 MG PO TABS
ORAL_TABLET | ORAL | Status: AC
  Filled 2020-10-19: qty 1

## 2020-10-19 MED ORDER — LIDOCAINE 2% (20 MG/ML) 5 ML SYRINGE
INTRAMUSCULAR | Status: DC | PRN
  Administered 2020-10-19: 60 mg via INTRAVENOUS

## 2020-10-19 MED ORDER — OXYCODONE HCL 5 MG PO TABS
5.0000 mg | ORAL_TABLET | Freq: Once | ORAL | Status: AC | PRN
  Administered 2020-10-19: 5 mg via ORAL

## 2020-10-19 MED ORDER — PHENYLEPHRINE 40 MCG/ML (10ML) SYRINGE FOR IV PUSH (FOR BLOOD PRESSURE SUPPORT)
PREFILLED_SYRINGE | INTRAVENOUS | Status: DC | PRN
  Administered 2020-10-19 (×4): 80 ug via INTRAVENOUS

## 2020-10-19 MED ORDER — KETAMINE HCL 100 MG/ML IJ SOLN
INTRAMUSCULAR | Status: DC | PRN
  Administered 2020-10-19: 20 mg via INTRAVENOUS

## 2020-10-19 MED ORDER — FENTANYL CITRATE (PF) 100 MCG/2ML IJ SOLN: 25.0000 ug | INTRAMUSCULAR | Status: DC | PRN

## 2020-10-19 MED ORDER — FENTANYL CITRATE (PF) 100 MCG/2ML IJ SOLN
INTRAMUSCULAR | Status: DC | PRN
  Administered 2020-10-19: 25 ug via INTRAVENOUS

## 2020-10-19 MED ORDER — FENTANYL CITRATE (PF) 100 MCG/2ML IJ SOLN
INTRAMUSCULAR | Status: AC
  Filled 2020-10-19: qty 2

## 2020-10-19 MED ORDER — EPHEDRINE 5 MG/ML INJ
INTRAVENOUS | Status: AC
  Filled 2020-10-19: qty 10

## 2020-10-19 MED ORDER — CELECOXIB 200 MG PO CAPS
200.0000 mg | ORAL_CAPSULE | ORAL | Status: AC
  Administered 2020-10-19: 200 mg via ORAL

## 2020-10-19 MED ORDER — HYDROCODONE-ACETAMINOPHEN 5-325 MG PO TABS: 1.0000 | ORAL_TABLET | Freq: Four times a day (QID) | ORAL | 0 refills | Status: DC | PRN

## 2020-10-19 MED ORDER — ONDANSETRON HCL 4 MG/2ML IJ SOLN
INTRAMUSCULAR | Status: AC
  Filled 2020-10-19: qty 2

## 2020-10-19 MED ORDER — DEXAMETHASONE SODIUM PHOSPHATE 10 MG/ML IJ SOLN
INTRAMUSCULAR | Status: DC | PRN
  Administered 2020-10-19: 10 mg via INTRAVENOUS

## 2020-10-19 MED ORDER — ACETAMINOPHEN 500 MG PO TABS
ORAL_TABLET | ORAL | Status: AC
  Filled 2020-10-19: qty 2

## 2020-10-19 MED ORDER — SCOPOLAMINE 1 MG/3DAYS TD PT72
MEDICATED_PATCH | TRANSDERMAL | Status: AC
  Filled 2020-10-19: qty 1

## 2020-10-19 MED ORDER — ONDANSETRON HCL 4 MG/2ML IJ SOLN
INTRAMUSCULAR | Status: DC | PRN
  Administered 2020-10-19: 4 mg via INTRAVENOUS

## 2020-10-19 MED ORDER — MIDAZOLAM HCL 5 MG/5ML IJ SOLN
INTRAMUSCULAR | Status: DC | PRN
  Administered 2020-10-19: 2 mg via INTRAVENOUS

## 2020-10-19 MED ORDER — ONDANSETRON HCL 4 MG/2ML IJ SOLN: 4.0000 mg | Freq: Once | INTRAMUSCULAR | Status: DC | PRN

## 2020-10-19 MED ORDER — LIDOCAINE 2% (20 MG/ML) 5 ML SYRINGE
INTRAMUSCULAR | Status: AC
  Filled 2020-10-19: qty 5

## 2020-10-19 MED ORDER — AMISULPRIDE (ANTIEMETIC) 5 MG/2ML IV SOLN: 10.0000 mg | Freq: Once | INTRAVENOUS | Status: DC | PRN

## 2020-10-19 MED ORDER — BUPIVACAINE-EPINEPHRINE 0.25% -1:200000 IJ SOLN
INTRAMUSCULAR | Status: DC | PRN
  Administered 2020-10-19: 20 mL

## 2020-10-19 MED ORDER — 0.9 % SODIUM CHLORIDE (POUR BTL) OPTIME
TOPICAL | Status: DC | PRN
  Administered 2020-10-19: 1000 mL

## 2020-10-19 MED ORDER — DEXAMETHASONE SODIUM PHOSPHATE 10 MG/ML IJ SOLN
INTRAMUSCULAR | Status: AC
  Filled 2020-10-19: qty 1

## 2020-10-19 MED ORDER — EPHEDRINE SULFATE-NACL 50-0.9 MG/10ML-% IV SOSY
PREFILLED_SYRINGE | INTRAVENOUS | Status: DC | PRN
  Administered 2020-10-19 (×5): 10 mg via INTRAVENOUS

## 2020-10-19 MED ORDER — SCOPOLAMINE 1 MG/3DAYS TD PT72
1.0000 | MEDICATED_PATCH | TRANSDERMAL | Status: DC
  Administered 2020-10-19: 1.5 mg via TRANSDERMAL

## 2020-10-19 MED ORDER — KETAMINE HCL 100 MG/ML IJ SOLN
INTRAMUSCULAR | Status: AC
  Filled 2020-10-19: qty 1

## 2020-10-19 MED ORDER — CEFAZOLIN SODIUM-DEXTROSE 2-4 GM/100ML-% IV SOLN
2.0000 g | INTRAVENOUS | Status: AC
  Administered 2020-10-19: 2 g via INTRAVENOUS

## 2020-10-19 MED ORDER — LACTATED RINGERS IV SOLN: INTRAVENOUS | Status: DC

## 2020-10-19 MED ORDER — PROPOFOL 10 MG/ML IV BOLUS
INTRAVENOUS | Status: DC | PRN
Start: 2020-10-19 — End: 2020-10-19
  Administered 2020-10-19: 200 mg via INTRAVENOUS

## 2020-10-19 MED ORDER — GABAPENTIN 300 MG PO CAPS
ORAL_CAPSULE | ORAL | Status: AC
  Filled 2020-10-19: qty 1

## 2020-10-19 MED ORDER — GABAPENTIN 300 MG PO CAPS
300.0000 mg | ORAL_CAPSULE | ORAL | Status: AC
  Administered 2020-10-19: 300 mg via ORAL

## 2020-10-19 MED ORDER — ACETAMINOPHEN 500 MG PO TABS
1000.0000 mg | ORAL_TABLET | ORAL | Status: AC
  Administered 2020-10-19: 1000 mg via ORAL

## 2020-10-19 MED ORDER — CELECOXIB 200 MG PO CAPS
ORAL_CAPSULE | ORAL | Status: AC
  Filled 2020-10-19: qty 1

## 2020-10-19 MED ORDER — CEFAZOLIN SODIUM-DEXTROSE 2-4 GM/100ML-% IV SOLN
INTRAVENOUS | Status: AC
  Filled 2020-10-19: qty 100

## 2020-10-19 MED ORDER — OXYCODONE HCL 5 MG/5ML PO SOLN: 5.0000 mg | Freq: Once | ORAL | Status: AC | PRN

## 2020-10-19 MED ORDER — MIDAZOLAM HCL 2 MG/2ML IJ SOLN
INTRAMUSCULAR | Status: AC
  Filled 2020-10-19: qty 2

## 2020-10-19 SURGICAL SUPPLY — 42 items
ADH SKN CLS APL DERMABOND .7 (GAUZE/BANDAGES/DRESSINGS) ×1
APL PRP STRL LF DISP 70% ISPRP (MISCELLANEOUS) ×1
APPLIER CLIP 9.375 MED OPEN (MISCELLANEOUS)
APR CLP MED 9.3 20 MLT OPN (MISCELLANEOUS)
BLADE SURG 15 STRL LF DISP TIS (BLADE) ×1 IMPLANT
BLADE SURG 15 STRL SS (BLADE) ×2
CANISTER SUCT 1200ML W/VALVE (MISCELLANEOUS) ×2 IMPLANT
CHLORAPREP W/TINT 26 (MISCELLANEOUS) ×2 IMPLANT
CLIP APPLIE 9.375 MED OPEN (MISCELLANEOUS) IMPLANT
COVER BACK TABLE 60X90IN (DRAPES) ×2 IMPLANT
COVER MAYO STAND STRL (DRAPES) ×2 IMPLANT
COVER WAND RF STERILE (DRAPES) IMPLANT
DECANTER SPIKE VIAL GLASS SM (MISCELLANEOUS) IMPLANT
DERMABOND ADVANCED (GAUZE/BANDAGES/DRESSINGS) ×1
DERMABOND ADVANCED .7 DNX12 (GAUZE/BANDAGES/DRESSINGS) ×1 IMPLANT
DRAPE LAPAROSCOPIC ABDOMINAL (DRAPES) ×2 IMPLANT
DRAPE UTILITY XL STRL (DRAPES) ×2 IMPLANT
ELECT COATED BLADE 2.86 ST (ELECTRODE) ×2 IMPLANT
ELECT REM PT RETURN 9FT ADLT (ELECTROSURGICAL) ×2
ELECTRODE REM PT RTRN 9FT ADLT (ELECTROSURGICAL) ×1 IMPLANT
GLOVE SURG ENC MOIS LTX SZ7.5 (GLOVE) ×2 IMPLANT
GOWN STRL REUS W/ TWL LRG LVL3 (GOWN DISPOSABLE) ×2 IMPLANT
GOWN STRL REUS W/TWL LRG LVL3 (GOWN DISPOSABLE) ×4
ILLUMINATOR WAVEGUIDE N/F (MISCELLANEOUS) IMPLANT
KIT MARKER MARGIN INK (KITS) IMPLANT
LIGHT WAVEGUIDE WIDE FLAT (MISCELLANEOUS) IMPLANT
NDL HYPO 25X1 1.5 SAFETY (NEEDLE) ×1 IMPLANT
NEEDLE HYPO 25X1 1.5 SAFETY (NEEDLE) ×2 IMPLANT
NS IRRIG 1000ML POUR BTL (IV SOLUTION) ×2 IMPLANT
PACK BASIN DAY SURGERY FS (CUSTOM PROCEDURE TRAY) ×2 IMPLANT
PENCIL SMOKE EVACUATOR (MISCELLANEOUS) ×2 IMPLANT
SLEEVE SCD COMPRESS KNEE MED (MISCELLANEOUS) ×2 IMPLANT
SPONGE LAP 18X18 RF (DISPOSABLE) ×2 IMPLANT
STAPLER VISISTAT 35W (STAPLE) IMPLANT
SUT MON AB 4-0 PC3 18 (SUTURE) ×2 IMPLANT
SUT SILK 2 0 SH (SUTURE) IMPLANT
SUT VICRYL 3-0 CR8 SH (SUTURE) ×2 IMPLANT
SYR CONTROL 10ML LL (SYRINGE) ×2 IMPLANT
TOWEL GREEN STERILE FF (TOWEL DISPOSABLE) ×2 IMPLANT
TRAY FAXITRON CT DISP (TRAY / TRAY PROCEDURE) IMPLANT
TUBE CONNECTING 20X1/4 (TUBING) ×2 IMPLANT
YANKAUER SUCT BULB TIP NO VENT (SUCTIONS) ×2 IMPLANT

## 2020-10-19 NOTE — Interval H&P Note (Signed)
History and Physical Interval Note:  10/19/2020 10:04 AM  [name]  has presented today for surgery, with the diagnosis of LEFT BREAST CANCER.  The various methods of treatment have been discussed with the patient and family. After consideration of risks, benefits and other options for treatment, the patient has consented to  Procedure(s): RE-EXCISION LEFT BREAST MARGINS (Left) as a surgical intervention.  The patient's history has been reviewed, patient examined, no change in status, stable for surgery.  I have reviewed the patient's chart and labs.  Questions were answered to the patient's satisfaction.     [name]

## 2020-10-19 NOTE — Anesthesia Postprocedure Evaluation (Signed)
Anesthesia Post Note  Patient: Maeryn J Whan  Procedure(s) Performed: RE-EXCISION LEFT BREAST MARGINS (Left Breast)     Patient location during evaluation: PACU Anesthesia Type: General Level of consciousness: awake and alert Pain management: pain level controlled Vital Signs Assessment: post-procedure vital signs reviewed and stable Respiratory status: spontaneous breathing, nonlabored ventilation and respiratory function stable Cardiovascular status: blood pressure returned to baseline and stable Postop Assessment: no apparent nausea or vomiting Anesthetic complications: no   No complications documented.  Last Vitals:  Vitals:   10/19/20 1220 10/19/20 1229  BP:  131/73  Pulse: 78 85  Resp: 12 16  Temp:  37.2 C  SpO2: 97% 98%    Last Pain:  Vitals:   10/19/20 1229  TempSrc:   PainSc: 6                  Lidia Collum

## 2020-10-19 NOTE — Anesthesia Preprocedure Evaluation (Signed)
Anesthesia Evaluation  Patient identified by MRN, date of birth, ID band Patient awake    Reviewed: Allergy & Precautions, NPO status , Patient's Chart, lab work & pertinent test results, reviewed documented beta blocker date and time   History of Anesthesia Complications (+) PONV and history of anesthetic complications (PONV (nausea only) with last spine surgery in 2019- 6h case @ [facility])  Airway Mallampati: II  TM Distance: >3 FB Neck ROM: Full    Dental no notable dental hx. (+) Teeth Intact, Dental Advisory Given   Pulmonary asthma , sleep apnea ,    Pulmonary exam normal        Cardiovascular hypertension, Pt. on home beta blockers Normal cardiovascular exam     Neuro/Psych  Headaches, PSYCHIATRIC DISORDERS Anxiety Depression  Neuromuscular disease (peripheral neuropathy)    GI/Hepatic negative GI ROS, Neg liver ROS,   Endo/Other  diabetes, Well Controlled, Type 2, Oral Hypoglycemic AgentsObesity BMI 33 a1c 6.1  Renal/GU negative Renal ROS  negative genitourinary   Musculoskeletal  (+) Arthritis , Osteoarthritis,  Chronic LBP s/p multiple back surgeries, neurostimulator   Abdominal   Peds  Hematology Multiple myeloma 2019, MGUS   Anesthesia Other Findings  L breast ca   Chronic LBP and peripheral neuropathic pain- takes morphine 89m BID and norco 7.541moccasionally on top of the morphine for breakthrough pain  Reproductive/Obstetrics negative OB ROS                             Anesthesia Physical  Anesthesia Plan  ASA: III  Anesthesia Plan: General   Post-op Pain Management:    Induction: Intravenous  PONV Risk Score and Plan: 4 or greater and Ondansetron, Dexamethasone, Midazolam and Treatment may vary due to age or medical condition  Airway Management Planned: LMA  Additional Equipment: None  Intra-op Plan:   Post-operative Plan: Extubation in OR  Informed Consent: I  have reviewed the patients History and Physical, chart, labs and discussed the procedure including the risks, benefits and alternatives for the proposed anesthesia with the patient or authorized representative who has indicated his/her understanding and acceptance.     Dental advisory given  Plan Discussed with:   Anesthesia Plan Comments:         Anesthesia Quick Evaluation

## 2020-10-19 NOTE — Discharge Instructions (Signed)
Next dose of Tylenol can be given after 3:15 PM Next dose of NSAID (Ibuprofen, Aleve, Motrin) can be given after 3:15 PM.    Post Anesthesia Home Care Instructions  Activity: Get plenty of rest for the remainder of the day. A responsible individual must stay with you for 24 hours following the procedure.  For the next 24 hours, DO NOT: -Drive a car -Paediatric nurse -Drink alcoholic beverages -Take any medication unless instructed by your physician -Make any legal decisions or sign important papers.  Meals: Start with liquid foods such as gelatin or soup. Progress to regular foods as tolerated. Avoid greasy, spicy, heavy foods. If nausea and/or vomiting occur, drink only clear liquids until the nausea and/or vomiting subsides. Call your physician if vomiting continues.  Special Instructions/Symptoms: Your throat may feel dry or sore from the anesthesia or the breathing tube placed in your throat during surgery. If this causes discomfort, gargle with warm salt water. The discomfort should disappear within 24 hours.  If you had a scopolamine patch placed behind your ear for the management of post- operative nausea and/or vomiting:  1. The medication in the patch is effective for 72 hours, after which it should be removed.  Wrap patch in a tissue and discard in the trash. Wash hands thoroughly with soap and water. 2. You may remove the patch earlier than 72 hours if you experience unpleasant side effects which may include dry mouth, dizziness or visual disturbances. 3. Avoid touching the patch. Wash your hands with soap and water after contact with the patch.

## 2020-10-19 NOTE — OR Nursing (Signed)
Assessment/pre-procedure check-in performed by Rockne Menghini, RN, not as charted Arrie Aran RN)

## 2020-10-19 NOTE — Op Note (Signed)
10/19/2020  10:59 AM  PATIENT:  [name]  70 y.o. female  PRE-OPERATIVE DIAGNOSIS:  LEFT BREAST CANCER  POST-OPERATIVE DIAGNOSIS:  LEFT BREAST CANCER  PROCEDURE:  Procedure(s): RE-EXCISION LEFT BREAST MARGINS (Left)  SURGEON:  Surgeon(s) and Role:    * Jovita Kussmaul, MD - Primary  PHYSICIAN ASSISTANT:   ASSISTANTS: Dr. Liborio Nixon   ANESTHESIA:   local and general  EBL:  5 mL   BLOOD ADMINISTERED:none  DRAINS: none   LOCAL MEDICATIONS USED:  MARCAINE     SPECIMEN:  Source of Specimen:  left breast superior and medial margins  DISPOSITION OF SPECIMEN:  PATHOLOGY  COUNTS:  YES  TOURNIQUET:  * No tourniquets in log *  DICTATION: .Dragon Dictation   After informed consent was obtained the patient was brought to the operating room and placed in the supine position on the operating table.  After adequate induction of general anesthesia the patient's left breast was prepped with ChloraPrep, allowed to dry, and draped in usual sterile manner.  An appropriate timeout was performed.  The patient previously had a lumpectomy for breast cancer.  She had a positive focal superior and medial margin.  She returns today to have these margins reexcised.  The area around the previous lumpectomy incision was infiltrated with quarter percent Marcaine.  The previous periareolar incision was opened sharply with a 15 blade knife.  I was able to gently finger dissect back into the previous lumpectomy cavity.  The superior and medial margins were then reexcised sharply with the electrocautery and marked with the appropriate paint color.  This tissue was sent to pathology for further evaluation.  Hemostasis was achieved using the Bovie electrocautery.  The wound was then irrigated with saline and infiltrated with more quarter percent Marcaine.  The deep layer of the wound was closed with layers of interrupted 3-0 Vicryl stitches.  The skin was closed with interrupted 4-0 Monocryl subcuticular  stitches.  Dermabond dressings were applied.  The patient tolerated the procedure well.  At the end of the case all needle sponge and instrument counts were correct.  The patient was then awakened and taken recovery in stable condition.  PLAN OF CARE: Discharge to home after PACU  PATIENT DISPOSITION:  PACU - hemodynamically stable.   Delay start of Pharmacological VTE agent (>24hrs) due to surgical blood loss or risk of bleeding: not applicable

## 2020-10-19 NOTE — H&P (Signed)
[name]  Location: [facility] Patient #: [id number] DOB: [birth year] Married / Language: English / Race: White Female   History of Present Illness  The patient is a 70 year old female who presents for a follow-up for Breast cancer. The patient is a 70 year old white female who is about 2 weeks status post left breast lumpectomy and sentinel node biopsy for a T1 cN0 left breast cancer that was ER/PR positive and HER-2 negative with a Ki-67 of 2%. She tolerated the surgery well. She did end up having a focally positive superior and medial margin. The nodes were negative. She is scheduled for reexcision in 6 days    Review of Systems  General Present- Fatigue. Not Present- Appetite Loss, Chills, Fever, Night Sweats, Weight Gain and Weight Loss. Skin Present- Dryness. Not Present- Change in Wart/Mole, Hives, Jaundice, New Lesions, Non-Healing Wounds, Rash and Ulcer. HEENT Present- Hearing Loss, Ringing in the Ears, Seasonal Allergies and Sinus Pain. Not Present- Earache, Hoarseness, Nose Bleed, Oral Ulcers, Sore Throat, Visual Disturbances, Wears glasses/contact lenses and Yellow Eyes. Respiratory Present- Snoring. Not Present- Bloody sputum, Chronic Cough, Difficulty Breathing and Wheezing. Breast Not Present- Breast Mass, Breast Pain, Nipple Discharge and Skin Changes. Cardiovascular Present- Swelling of Extremities. Not Present- Chest Pain, Difficulty Breathing Lying Down, Leg Cramps, Palpitations, Rapid Heart Rate and Shortness of Breath. Gastrointestinal Not Present- Abdominal Pain, Bloating, Bloody Stool, Change in Bowel Habits, Chronic diarrhea, Constipation, Difficulty Swallowing, Excessive gas, Gets full quickly at meals, Hemorrhoids, Indigestion, Nausea, Rectal Pain and Vomiting. Female Genitourinary Not Present- Frequency, Nocturia, Painful Urination, Pelvic Pain and Urgency. Musculoskeletal Present- Back Pain, Joint Stiffness and Muscle Weakness. Not Present-  Joint Pain, Muscle Pain and Swelling of Extremities. Neurological Present- Headaches, Numbness and Trouble walking. Not Present- Decreased Memory, Fainting, Seizures, Tingling, Tremor and Weakness. Psychiatric Present- Change in Sleep Pattern and Depression. Not Present- Anxiety, Bipolar, Fearful and Frequent crying. Endocrine Present- New Diabetes. Not Present- Cold Intolerance, Excessive Hunger, Hair Changes, Heat Intolerance and Hot flashes. Hematology Not Present- Blood Thinners, Easy Bruising, Excessive bleeding, Gland problems, HIV and Persistent Infections.   Physical Exam  General Mental Status-Alert. General Appearance-Consistent with stated age. Hydration-Well hydrated. Voice-Normal.  Head and Neck Head-normocephalic, atraumatic with no lesions or palpable masses. Trachea-midline. Thyroid Gland Characteristics - normal size and consistency.  Eye Eyeball - Bilateral-Extraocular movements intact. Sclera/Conjunctiva - Bilateral-No scleral icterus.  Chest and Lung Exam Chest and lung exam reveals -quiet, even and easy respiratory effort with no use of accessory muscles and on auscultation, normal breath sounds, no adventitious sounds and normal vocal resonance. Inspection Chest Wall - Normal. Back - normal.  Breast Note: The left breast periareolar and axillary incisions are healing nicely with no sign of infection or significant seroma.   Cardiovascular Cardiovascular examination reveals -normal heart sounds, regular rate and rhythm with no murmurs and normal pedal pulses bilaterally.  Abdomen Inspection Inspection of the abdomen reveals - No Hernias. Skin - Scar - no surgical scars. Palpation/Percussion Palpation and Percussion of the abdomen reveal - Soft, Non Tender, No Rebound tenderness, No Rigidity (guarding) and No hepatosplenomegaly. Auscultation Auscultation of the abdomen reveals - Bowel sounds normal.  Neurologic Neurologic  evaluation reveals -alert and oriented x 3 with no impairment of recent or remote memory. Mental Status-Normal.  Musculoskeletal Normal Exam - Left-Upper Extremity Strength Normal and Lower Extremity Strength Normal. Normal Exam - Right-Upper Extremity Strength Normal and Lower Extremity Strength Normal.  Lymphatic Head & Neck  General Head & Neck  Lymphatics: Bilateral - Description - Normal. Axillary  General Axillary Region: Bilateral - Description - Normal. Tenderness - Non Tender. Femoral & Inguinal  Generalized Femoral & Inguinal Lymphatics: Bilateral - Description - Normal. Tenderness - Non Tender.    Assessment & Plan  MALIGNANT NEOPLASM OF UPPER-OUTER QUADRANT OF LEFT BREAST IN FEMALE, ESTROGEN RECEPTOR POSITIVE (C50.412) Impression: The patient is about 2 weeks status post left breast lumpectomy for breast cancer. She tolerated the surgery well. She did have a focally positive superior and medial margin. Her nodes were negative. She is scheduled for reexcision of the margins in 6 days. She will continue to follow-up with physical therapy. She will follow up with medical and radiation oncology after the next surgery. I will see her back for her postop appointment after the next surgery. Current Plans Follow up with [name] in the office in 6 MONTHS.   Call us sooner as needed.

## 2020-10-19 NOTE — Anesthesia Procedure Notes (Signed)
Procedure Name: LMA Insertion Date/Time: 10/19/2020 10:17 AM Performed by: Genelle Bal, CRNA Pre-anesthesia Checklist: Patient identified, Emergency Drugs available, Suction available and Patient being monitored Patient Re-evaluated:Patient Re-evaluated prior to induction Oxygen Delivery Method: Circle system utilized Preoxygenation: Pre-oxygenation with 100% oxygen Induction Type: IV induction Ventilation: Mask ventilation without difficulty LMA: LMA inserted LMA Size: 4.0 Number of attempts: 1 Airway Equipment and Method: Bite block Placement Confirmation: positive ETCO2 Tube secured with: Tape Dental Injury: Teeth and Oropharynx as per pre-operative assessment

## 2020-10-19 NOTE — Transfer of Care (Signed)
Immediate Anesthesia Transfer of Care Note  Patient: [name]  Procedure(s) Performed: RE-EXCISION LEFT BREAST MARGINS (Left Breast)  Patient Location: PACU  Anesthesia Type:General  Level of Consciousness: drowsy and patient cooperative  Airway & Oxygen Therapy: Patient Spontanous Breathing and Patient connected to face mask oxygen  Post-op Assessment: Report given to RN and Post -op Vital signs reviewed and stable  Post vital signs: Reviewed and stable  Last Vitals:  Vitals Value Taken Time  BP 93/48 10/19/20 1104  Temp    Pulse 75 10/19/20 1106  Resp 6 10/19/20 1106  SpO2 100 % 10/19/20 1106  Vitals shown include unvalidated device data.  Last Pain:  Vitals:   10/19/20 0911  TempSrc: Oral  PainSc: 0-No pain         Complications: No complications documented.

## 2020-10-20 ENCOUNTER — Encounter (HOSPITAL_BASED_OUTPATIENT_CLINIC_OR_DEPARTMENT_OTHER): Payer: Self-pay | Admitting: General Surgery

## 2020-10-21 ENCOUNTER — Encounter (INDEPENDENT_AMBULATORY_CARE_PROVIDER_SITE_OTHER): Payer: Self-pay

## 2020-10-21 LAB — SURGICAL PATHOLOGY

## 2020-10-23 ENCOUNTER — Ambulatory Visit: Payer: BC Managed Care – PPO | Admitting: Rehabilitation

## 2020-10-27 ENCOUNTER — Other Ambulatory Visit: Payer: Self-pay | Admitting: Hematology & Oncology

## 2020-10-27 DIAGNOSIS — C50412 Malignant neoplasm of upper-outer quadrant of left female breast: Secondary | ICD-10-CM | POA: Diagnosis not present

## 2020-10-27 DIAGNOSIS — Z170 Estrogen receptor positive status [ER+]: Secondary | ICD-10-CM | POA: Diagnosis not present

## 2020-10-28 ENCOUNTER — Other Ambulatory Visit: Payer: BC Managed Care – PPO

## 2020-10-28 ENCOUNTER — Ambulatory Visit: Payer: BC Managed Care – PPO | Admitting: Hematology & Oncology

## 2020-11-03 ENCOUNTER — Encounter (HOSPITAL_COMMUNITY): Payer: Self-pay

## 2020-11-05 ENCOUNTER — Ambulatory Visit: Payer: BC Managed Care – PPO | Admitting: Rehabilitation

## 2020-11-11 ENCOUNTER — Inpatient Hospital Stay: Payer: BC Managed Care – PPO | Attending: Hematology & Oncology

## 2020-11-11 ENCOUNTER — Inpatient Hospital Stay (HOSPITAL_BASED_OUTPATIENT_CLINIC_OR_DEPARTMENT_OTHER): Payer: BC Managed Care – PPO | Admitting: Hematology & Oncology

## 2020-11-11 ENCOUNTER — Telehealth: Payer: Self-pay

## 2020-11-11 ENCOUNTER — Encounter: Payer: Self-pay | Admitting: Hematology & Oncology

## 2020-11-11 ENCOUNTER — Other Ambulatory Visit: Payer: Self-pay

## 2020-11-11 ENCOUNTER — Encounter: Payer: Self-pay | Admitting: *Deleted

## 2020-11-11 VITALS — BP 128/83 | HR 90 | Temp 98.4°F | Resp 18 | Ht 66.0 in | Wt 198.4 lb

## 2020-11-11 DIAGNOSIS — M791 Myalgia, unspecified site: Secondary | ICD-10-CM | POA: Diagnosis not present

## 2020-11-11 DIAGNOSIS — G629 Polyneuropathy, unspecified: Secondary | ICD-10-CM | POA: Insufficient documentation

## 2020-11-11 DIAGNOSIS — R0602 Shortness of breath: Secondary | ICD-10-CM | POA: Insufficient documentation

## 2020-11-11 DIAGNOSIS — R5383 Other fatigue: Secondary | ICD-10-CM | POA: Insufficient documentation

## 2020-11-11 DIAGNOSIS — D472 Monoclonal gammopathy: Secondary | ICD-10-CM | POA: Insufficient documentation

## 2020-11-11 DIAGNOSIS — Z79899 Other long term (current) drug therapy: Secondary | ICD-10-CM | POA: Insufficient documentation

## 2020-11-11 DIAGNOSIS — Z170 Estrogen receptor positive status [ER+]: Secondary | ICD-10-CM

## 2020-11-11 DIAGNOSIS — C50419 Malignant neoplasm of upper-outer quadrant of unspecified female breast: Secondary | ICD-10-CM

## 2020-11-11 DIAGNOSIS — M7989 Other specified soft tissue disorders: Secondary | ICD-10-CM | POA: Insufficient documentation

## 2020-11-11 DIAGNOSIS — R12 Heartburn: Secondary | ICD-10-CM | POA: Diagnosis not present

## 2020-11-11 DIAGNOSIS — E1140 Type 2 diabetes mellitus with diabetic neuropathy, unspecified: Secondary | ICD-10-CM | POA: Insufficient documentation

## 2020-11-11 DIAGNOSIS — C9000 Multiple myeloma not having achieved remission: Secondary | ICD-10-CM | POA: Diagnosis not present

## 2020-11-11 DIAGNOSIS — R002 Palpitations: Secondary | ICD-10-CM | POA: Diagnosis not present

## 2020-11-11 DIAGNOSIS — C50412 Malignant neoplasm of upper-outer quadrant of left female breast: Secondary | ICD-10-CM | POA: Insufficient documentation

## 2020-11-11 DIAGNOSIS — M2550 Pain in unspecified joint: Secondary | ICD-10-CM | POA: Diagnosis not present

## 2020-11-11 DIAGNOSIS — D510 Vitamin B12 deficiency anemia due to intrinsic factor deficiency: Secondary | ICD-10-CM | POA: Diagnosis not present

## 2020-11-11 DIAGNOSIS — K5900 Constipation, unspecified: Secondary | ICD-10-CM | POA: Diagnosis not present

## 2020-11-11 LAB — CMP (CANCER CENTER ONLY)
ALT: 14 U/L (ref 0–44)
AST: 15 U/L (ref 15–41)
Albumin: 4.7 g/dL (ref 3.5–5.0)
Alkaline Phosphatase: 55 U/L (ref 38–126)
Anion gap: 9 (ref 5–15)
BUN: 20 mg/dL (ref 8–23)
CO2: 32 mmol/L (ref 22–32)
Calcium: 10.6 mg/dL — ABNORMAL HIGH (ref 8.9–10.3)
Chloride: 99 mmol/L (ref 98–111)
Creatinine: 0.78 mg/dL (ref 0.44–1.00)
GFR, Estimated: >60 mL/min (ref >60)
Glucose, Bld: 200 mg/dL — ABNORMAL HIGH (ref 70–99)
Potassium: 3.6 mmol/L (ref 3.5–5.1)
Sodium: 140 mmol/L (ref 135–145)
Total Bilirubin: 0.6 mg/dL (ref 0.3–1.2)
Total Protein: 8 g/dL (ref 6.5–8.1)

## 2020-11-11 LAB — CBC WITH DIFFERENTIAL (CANCER CENTER ONLY)
Abs Immature Granulocytes: 0.04 10*3/uL (ref 0.00–0.07)
Basophils Absolute: 0.1 10*3/uL (ref 0.0–0.1)
Basophils Relative: 1 %
Eosinophils Absolute: 0.3 10*3/uL (ref 0.0–0.5)
Eosinophils Relative: 3 %
HCT: 44.6 % (ref 36.0–46.0)
Hemoglobin: 14.5 g/dL (ref 12.0–15.0)
Immature Granulocytes: 0 %
Lymphocytes Relative: 31 %
Lymphs Abs: 3.3 10*3/uL (ref 0.7–4.0)
MCH: 28.7 pg (ref 26.0–34.0)
MCHC: 32.5 g/dL (ref 30.0–36.0)
MCV: 88.3 fL (ref 80.0–100.0)
Monocytes Absolute: 0.6 10*3/uL (ref 0.1–1.0)
Monocytes Relative: 6 %
Neutro Abs: 6.5 10*3/uL (ref 1.7–7.7)
Neutrophils Relative %: 59 %
Platelet Count: 385 10*3/uL (ref 150–400)
RBC: 5.05 MIL/uL (ref 3.87–5.11)
RDW: 12.3 % (ref 11.5–15.5)
WBC Count: 10.9 10*3/uL — ABNORMAL HIGH (ref 4.0–10.5)
nRBC: 0 % (ref 0.0–0.2)

## 2020-11-11 LAB — LACTATE DEHYDROGENASE: LDH: 169 U/L (ref 98–192)

## 2020-11-11 NOTE — Progress Notes (Signed)
Hematology and Oncology Follow Up Visit  [name] [id number] 1950-11-13 70 y.o. 11/11/2020   Principle Diagnosis:  1. Stage IA (T1cN0M0) infiltrating ductal carcinoma of the LEFT breast -- ER+/PR+/HER2- -- Oncotype = 16 2. IgG Kappa smoldering myeloma 3. Chronic neuropathy 4. Pernicious Anemia  Current Therapy:   Velcade q week dosing (3/1) -- s/p cycle #2 -- d/c on 02/06/2018 for lack of effectiveness Vit B12 1000 mcg IM q month - start on 03/25/2020  Interim History:  [name] is here today for follow-up.  She did undergo a lumpectomy.  This was done on 09/30/2020.  The pathology report ([id number]) showed a 1.1 cm invasive ductal carcinoma that was moderately differentiated.  She had a positive margin.  She had 3 - lymph nodes.  She had a very low Ki67 percentage of 2%.  She did undergo a second surgery did make sure that the margins were negative.  She has not yet seen radiation oncology.  She will need radiation therapy.  She had Oncotype score that was only 16.  As such, she does not need any chemotherapy.  Overall, there is been no problems with recovery.  She has had no swelling in the left arm.  She has had no shoulder issues with the left shoulder.  There is been no problems with bowels or bladder.  Pressures have neuropathy.    We have been watching her smoldering myeloma.  Back in December, her M spike was 1.1 g/dl.  Her IgG level was 1750 mg/dl.  The kappa light chain was 4 mg/dl.  Currently, I would have to say that her performance status is ECOG 1.   Medications:  Allergies as of 11/11/2020      Reactions   Dristan [pheniramine-phenylephrine] Palpitations   Other Palpitations   Contact "old cold medicine      Medication List       Accurate as of November 11, 2020  1:11 PM. If you have any questions, ask your nurse or doctor.        Accu-Chek Softclix Lancets lancets SMARTSIG:Topical   albuterol 108 (90 Base) MCG/ACT inhaler Commonly known as:  VENTOLIN HFA Inhale into the lungs every 6 (six) hours as needed for wheezing or shortness of breath.   cyanocobalamin 1000 MCG/ML injection Commonly known as: (VITAMIN B-12) Inject 1000 mcg (1 mL) into the muscle every 30 days for 6 months.   cyclobenzaprine 5 MG tablet Commonly known as: FLEXERIL Take 5 mg by mouth 3 (three) times daily as needed.   FreeStyle Libre 14 Day Sensor Misc AS DIRECTED ONE EVERY 2 WEEKS   FreeStyle Libre Reader Kerrin Mo See admin instructions.   HYDROcodone-acetaminophen 10-325 MG tablet Commonly known as: NORCO TAKE 1 TABLET BY MOUTH EVERY SIX HOURS AS NEEDED DOSE CHANGE What changed: Another medication with the same name was removed. Continue taking this medication, and follow the directions you see here. Changed by: [name], MD   metFORMIN 500 MG 24 hr tablet Commonly known as: GLUCOPHAGE-XR Take 1,000 mg by mouth 2 (two) times daily.   metoprolol succinate 100 MG 24 hr tablet Commonly known as: TOPROL-XL Take 100 mg by mouth daily.   morphine 60 MG 12 hr tablet Commonly known as: MS CONTIN Take 60 mg by mouth every 12 (twelve) hours.   pravastatin 40 MG tablet Commonly known as: PRAVACHOL Take 40 mg by mouth daily.   venlafaxine XR 150 MG 24 hr capsule Commonly known as: EFFEXOR-XR Take 150 mg by mouth  daily with breakfast.       Allergies:  Allergies  Allergen Reactions  . Dristan [Pheniramine-Phenylephrine] Palpitations  . Other Palpitations    Contact "old cold medicine    Past Medical History, Surgical history, Social history, and Family History were reviewed and updated.  Review of Systems: Review of Systems  Constitutional: Positive for malaise/fatigue.  HENT: Negative.   Eyes: Negative.   Respiratory: Positive for shortness of breath.   Cardiovascular: Positive for palpitations and leg swelling.  Gastrointestinal: Positive for constipation and heartburn.  Genitourinary: Negative.   Musculoskeletal: Positive  for joint pain and myalgias.  Skin: Negative.   Neurological: Positive for tingling.  Endo/Heme/Allergies: Negative.   Psychiatric/Behavioral: Negative.      Physical Exam:  height is 5' 6"  (1.676 m) and weight is 198 lb 6.4 oz (90 kg). Her oral temperature is 98.4 F (36.9 C). Her blood pressure is 128/83 and her pulse is 90. Her respiration is 18 and oxygen saturation is 98%.   Wt Readings from Last 3 Encounters:  11/11/20 198 lb 6.4 oz (90 kg)  10/19/20 203 lb 11.3 oz (92.4 kg)  09/30/20 202 lb 13.2 oz (92 kg)  Is 1 weight 1 1% patient is no  Physical Exam Vitals reviewed.  Constitutional:      Comments: On her breast exam, the right breast shows the biopsy site to be well-healed.  There is no distinct mass in the right breast.  There is no right axillary adenopathy.  Left breast shows the lumpectomy scar at the 1 o'clock position.  This is at the edge of the areola.  She has a well healing lymphadenectomy scar in the left axilla.   HENT:     Head: Normocephalic and atraumatic.  Eyes:     Pupils: Pupils are equal, round, and reactive to light.  Cardiovascular:     Rate and Rhythm: Normal rate and regular rhythm.     Heart sounds: Normal heart sounds.  Pulmonary:     Effort: Pulmonary effort is normal.     Breath sounds: Normal breath sounds.  Abdominal:     General: Bowel sounds are normal.     Palpations: Abdomen is soft.  Musculoskeletal:        General: No tenderness or deformity. Normal range of motion.     Cervical back: Normal range of motion.  Lymphadenopathy:     Cervical: No cervical adenopathy.  Skin:    General: Skin is warm and dry.     Findings: No erythema or rash.  Neurological:     Mental Status: She is alert and oriented to person, place, and time.  Psychiatric:        Behavior: Behavior normal.        Thought Content: Thought content normal.        Judgment: Judgment normal.     Lab Results  Component Value Date   WBC 10.9 (H) 11/11/2020    HGB 14.5 11/11/2020   HCT 44.6 11/11/2020   MCV 88.3 11/11/2020   PLT 385 11/11/2020   Lab Results  Component Value Date   FERRITIN 55 10/03/2019   IRON 68 10/03/2019   TIBC 389 10/03/2019   UIBC 321 10/03/2019   IRONPCTSAT 17 (L) 10/03/2019   Lab Results  Component Value Date   RBC 5.05 11/11/2020   Lab Results  Component Value Date   KPAFRELGTCHN 39.5 (H) 09/02/2020   LAMBDASER 26.9 (H) 09/02/2020   KAPLAMBRATIO 1.47 09/02/2020   Lab Results  Component Value Date   IGGSERUM 1,952 (H) 09/02/2020   IGA 83 (L) 09/02/2020   IGMSERUM 66 09/02/2020   Lab Results  Component Value Date   TOTALPROTELP 8.0 09/02/2020   ALBUMINELP 4.1 09/02/2020   A1GS 0.2 09/02/2020   A2GS 1.0 09/02/2020   BETS 1.2 09/02/2020   BETA2SER 0.3 02/20/2015   GAMS 1.6 09/02/2020   MSPIKE 1.1 (H) 09/02/2020   SPEI * 02/20/2015     Chemistry      Component Value Date/Time   NA 140 11/11/2020 1202   NA 139 03/23/2020 1220   NA 140 03/27/2017 0804   K 3.6 11/11/2020 1202   K 3.7 03/27/2017 0804   CL 99 11/11/2020 1202   CL 94 (L) 02/20/2015 1010   CO2 32 11/11/2020 1202   CO2 28 03/27/2017 0804   BUN 20 11/11/2020 1202   BUN 18 03/23/2020 1220   BUN 14.9 03/27/2017 0804   CREATININE 0.78 11/11/2020 1202   CREATININE 0.9 03/27/2017 0804      Component Value Date/Time   CALCIUM 10.6 (H) 11/11/2020 1202   CALCIUM 10.0 03/27/2017 0804   ALKPHOS 55 11/11/2020 1202   ALKPHOS 69 03/27/2017 0804   AST 15 11/11/2020 1202   AST 26 03/27/2017 0804   ALT 14 11/11/2020 1202   ALT 28 03/27/2017 0804   BILITOT 0.6 11/11/2020 1202   BILITOT 0.76 03/27/2017 0804     Impression and Plan: Ms. Hayashi is 70 yo postmenopausal white female with smoldering myeloma and also chronic neuropathy due to diabetes and chronic back issues.   She now has a diagnosed stage Ia ductal carcinoma of the left breast.  She underwent a lumpectomy.  She has a low Oncotype score.  I really doubt that she is going to  benefit from any chemotherapy.  She will clearly need radiation therapy.  Once she has radiation therapy, then we will start her on Femara.  I feel confident that her risk of recurrence is going be less than 10%.  I would like to get her back in about 7 weeks.  At that time, we will then talk about Femara as she should be done with her radiation therapy.   [name], MD 3/2/20221:11 PM

## 2020-11-11 NOTE — Progress Notes (Signed)
Patient is doing well after her second surgery due to positive margins. She states she is healing well, and feels well overall, but her neuropathy is more bothersome today. She is ready for her next step in treatment but worried about the possibility of chemo.   After her visit with Dr Marin Olp, patient will need referral to RadOnc (placed) and will start AI once radiation complete.   Oncology Nurse Navigator Documentation  Oncology Nurse Navigator Flowsheets 11/11/2020  Abnormal Finding Date -  Confirmed Diagnosis Date -  Diagnosis Status -  Planned Course of Treatment Radiation  Phase of Treatment Radiation  Expected Surgery Date -  Surgery Actual Start Date: -  Navigator Follow Up Date: 11/17/2020  Navigator Follow Up Reason: Appointment Review  Navigator Location [facility]  Referral Date to RadOnc/MedOnc -  Navigator Encounter Type Follow-up Appt  Telephone -  Treatment Initiated Date 09/30/2020  Patient Visit Type MedOnc  Treatment Phase Active Tx  Barriers/Navigation Needs Coordination of Care;Education  Education Other  Interventions Education;Psycho-Social Support;Referrals  Acuity Level 2-Minimal Needs (1-2 Barriers Identified)  Referrals Radiation Oncology  Coordination of Care -  Education Method Verbal  Support Groups/Services Friends and Family  Time Spent with Patient 47

## 2020-11-11 NOTE — Telephone Encounter (Signed)
appts made and printed for pt per los   [name] 

## 2020-11-13 ENCOUNTER — Encounter: Payer: Self-pay | Admitting: Hematology & Oncology

## 2020-11-16 ENCOUNTER — Encounter: Payer: Self-pay | Admitting: Radiation Oncology

## 2020-11-16 ENCOUNTER — Telehealth: Payer: Self-pay

## 2020-11-16 NOTE — Telephone Encounter (Signed)
Spoke with patient in regards to telephone visit with Shona Simpson PA on 11/17/20 @ 9:30am. Patient verbalized understanding of appointment date and time. Called to review meaningful use questions. TM

## 2020-11-17 ENCOUNTER — Other Ambulatory Visit: Payer: Self-pay

## 2020-11-17 ENCOUNTER — Ambulatory Visit: Payer: BC Managed Care – PPO | Attending: General Surgery | Admitting: Rehabilitation

## 2020-11-17 ENCOUNTER — Encounter: Payer: Self-pay | Admitting: Rehabilitation

## 2020-11-17 ENCOUNTER — Ambulatory Visit
Admission: RE | Admit: 2020-11-17 | Discharge: 2020-11-17 | Disposition: A | Discharge status: Home / Self Care | Payer: BC Managed Care – PPO | Source: Ambulatory Visit | Attending: Radiation Oncology | Admitting: Radiation Oncology

## 2020-11-17 ENCOUNTER — Encounter: Payer: Self-pay | Admitting: *Deleted

## 2020-11-17 DIAGNOSIS — C50412 Malignant neoplasm of upper-outer quadrant of left female breast: Secondary | ICD-10-CM

## 2020-11-17 DIAGNOSIS — Z170 Estrogen receptor positive status [ER+]: Secondary | ICD-10-CM | POA: Diagnosis not present

## 2020-11-17 DIAGNOSIS — C50411 Malignant neoplasm of upper-outer quadrant of right female breast: Secondary | ICD-10-CM

## 2020-11-17 DIAGNOSIS — R293 Abnormal posture: Secondary | ICD-10-CM | POA: Insufficient documentation

## 2020-11-17 NOTE — Patient Instructions (Signed)
Scar Massage  Scar massage is done to improve the mobility of scar, decrease scar tissue from building up, reduce adhesions, and prevent Keloids from forming. Start scar massage after scabs have fallen off by themselves and no open areas. The first few weeks after surgery, it is normal for a scar to appear pink or red and slightly raised. Scars can itch or have areas of numbness. Some scars may be sensitive.   Direct Scar massage: after scar is healed, no opening, no scab 1.  Place pads of two fingers together directly on the scar starting at one end of the scar. Move the fingers up and down across the scar holding 5 seconds one direction.  Then go opposite direction hold 5 seconds.  2. Move over to the next section of the scar and repeat.  Work your way along the entire length of the scar.   3. Next make diagonal movements along the scar holding 5 seconds at one direction. 4. Next movement is side to side. 5. Do not rub fingers over the scar.  Instead keep firm pressure and move scar over the tissue it is on top   Scar Lift and Roll 12 weeks after surgery. 1. Pinch a small amount of the scar between your first two fingers and thumb.  2. Roll the scar between your fingers for 5 to 15 seconds. 3. Move along the scar and repeat until you have massaged the entire length of scar.   Stop the massage and call your doctor if you notice: 1. Increased redness 2. Bleeding from scar 3. Seepage coming from the scar 4. Scar is warmer and has increased pain    

## 2020-11-17 NOTE — Therapy (Signed)
[place], [place], [address] Phone: [phone]   Fax:  [phone]  Physical Therapy Treatment  Patient Details  Name: [name] MRN: [id number] Date of Birth: 02/20/1951 Referring Provider (PT): Dr. Marlou Starks   Encounter Date: 11/17/2020   PT End of Session - 11/17/20 1146    Visit Number 2    Number of Visits 2    Date for PT Re-Evaluation 11/12/20    PT Start Time 1103    PT Stop Time 1143    PT Time Calculation (min) 40 min    Activity Tolerance Patient tolerated treatment well    Behavior During Therapy [facility] for tasks assessed/performed           Past Medical History:  Diagnosis Date  . Allergies   . Anxiety   . Asthma    treated by pulmonologist   . Atherosclerosis of aorta ([place])    noted on xray  . Breast cancer ([place])    Right  . Chronic pain   . Depression   . Diabetes ([place])    type II  . Edema   . Goals of care, counseling/discussion 09/02/2020  . High cholesterol   . History of nuclear stress test 11/12/2009   normal   . Hypertension   . IgG monoclonal gammopathy of uncertain significance    igG kappa monoclonal gammopathy of unkown significance (per notes from [employer]).  . Insomnia   . Low back pain   . Migraine headache   . Neuropathy associated with MGUS ([place])   . Peripheral neuropathy   . PONV (postoperative nausea and vomiting)    hard to wake up  . Sleep apnea   . Smoldering multiple myeloma ([place]) 12/04/2017  . Vertigo     Past Surgical History:  Procedure Laterality Date  . BACK SURGERY  2019   T10-S1 Fusion Dr. Patrice Paradise  . BACK SURGERY  2019   2 fusion in lower back (L4-S1) and neck  . breast cyst removal age 26    . BREAST LUMPECTOMY WITH RADIOACTIVE SEED AND SENTINEL LYMPH NODE BIOPSY Left 09/30/2020   Procedure: RADIOACTIVE SEED GUIDED LEFT BREAST LUMPECTOMY, LEFT AXILLARY SENTINEL LYMPH NODE BIOPSY;  Surgeon: Jovita Kussmaul, MD;  Location:  [facility];  Service: General;  Laterality: Left;  . CESAREAN SECTION     x2  . hernia repair at 19 months old    . miscarriage D&C    . NASAL SINUS SURGERY     x2  . NECK SURGERY    . pain stimulator in back    . RE-EXCISION OF BREAST CANCER,SUPERIOR MARGINS Left 10/19/2020   Procedure: RE-EXCISION LEFT BREAST MARGINS;  Surgeon: Jovita Kussmaul, MD;  Location: [facility];  Service: General;  Laterality: Left;  . TONSILLECTOMY      There were no vitals filed for this visit.   Subjective Assessment - 11/17/20 1108    Subjective Doing okay.    Pertinent History Recent diagnosis of Lt breast cancer IDC stage IA with surgery 09/30/20 with 3 lymph nodes removed and then re-excision 10/19/20.  pt is followed by Dr. Marin Olp due to smoldering igG Kappa myeloma.  Other history includes: DM, Peripheral neuropathy, and HTN, cervical fusion with limited movement and back T10-S1, vertigo, recent fall with hand fracture    Limitations --   none   Currently in Pain? No/denies  [facility] PT Assessment - 11/17/20 0001      Observation/Other Assessments   Observations well healed incisions with some bruising around the left nipple. No breast swelling noted  Small area of edema under the axillary incision. Instructed pt with small pillow in the axilla      AROM   Overall AROM Comments able to lie supine arm overhead radiation position with only slight pull    Right Shoulder Extension 60 Degrees    Right Shoulder Flexion 115 Degrees    Right Shoulder ABduction 142 Degrees    Right Shoulder External Rotation 85 Degrees             LYMPHEDEMA/ONCOLOGY QUESTIONNAIRE - 11/17/20 0001      Surgeries   Lumpectomy Date 09/30/20    Other Surgery Date 10/19/20    Number Lymph Nodes Removed 3      Treatment   Active Chemotherapy Treatment No    Past Chemotherapy Treatment No    Active Radiation Treatment No    Past Radiation Treatment No    Current Hormone  Treatment No    Past Hormone Therapy No      What other symptoms do you have   Are you Having Heaviness or Tightness No    Are you having Pain No      Right Upper Extremity Lymphedema   10 cm Proximal to Ulnar Styloid Process 22.9 cm    Just Proximal to Ulnar Styloid Process 18.3 cm      Left Upper Extremity Lymphedema   15 cm Proximal to Olecranon Process 34.6 cm    10 cm Proximal to Olecranon Process 33.7 cm    Olecranon Process 30.5 cm    10 cm Proximal to Ulnar Styloid Process 23.8 cm   earlier measurement must be typo   Just Proximal to Ulnar Styloid Process 17.3 cm    Across Hand at [employer] 19.3 cm    At [place] of 2nd Digit 6.3 cm                      [facility] Adult PT Treatment/Exercise - 11/17/20 0001      Self-Care   Self-Care Other Self-Care Comments    Other Self-Care Comments  pt instructed on switching to supine star gazer stretch and flexion AAROM, education on scar tissue massage starting around 6 weeks post op, review of lymph risk reduction paper, and helped pt get signed up for ABC class (unable due to radiation schedule unknown but wil call back), discussed importance of activity for during radiation and when to return to PT.                  PT Education - 11/17/20 1146    Education Details exercises, POC, radiation, SOZO surveillance, scar mob/massage    Person(s) Educated Patient;Child(ren)    Methods Explanation;Demonstration;Handout    Comprehension Verbalized understanding;Returned demonstration;Verbal cues required                      Plan - 11/17/20 1147    Clinical Impression Statement Pt presents 4 weeks post left lumpectomy doing very well.  AROM is close to baseline flexion and equal abduction without pain or significant pulling or signs of cording.  Pt will be able to tolerate radiation without PT intervention and was educated on when to return to therapy.  Pt will continue with SOZO surveillance.    Personal  Factors and Comorbidities Age;Fitness;Comorbidity 2  PT Frequency --   SOZO   PT Next Visit Plan 3 month SOZO; any needs after radiation?    Consulted and Agree with Plan of Care Patient           Patient will benefit from skilled therapeutic intervention in order to improve the following deficits and impairments:     Visit Diagnosis: Malignant neoplasm of upper-outer quadrant of right breast in female, estrogen receptor positive ([place])  Abnormal posture     Problem List Patient Active Problem List   Diagnosis Date Noted  . Goals of care, counseling/discussion 09/02/2020  . Malignant neoplasm of upper-outer quadrant of left breast in female, estrogen receptor positive ([place]) 08/27/2020  . Iron deficiency anemia 10/09/2019  . Impaired mobility and activities of daily living 08/30/2018  . Multiple myeloma ([place]) 08/30/2018  . S/P spinal fusion 08/30/2018  . Lumbar degenerative disc disease 08/27/2018  . History of degenerative disc disease 08/15/2018  . Neck pain 08/15/2018  . Sensorineural hearing loss (SNHL), bilateral 08/15/2018  . Tinnitus of both ears 08/15/2018  . Smoldering multiple myeloma ([place]) 12/04/2017  . Breakdown (mechanical) of implanted electronic neurostimulator, generator, initial encounter ([place]) 08/12/2016  . Myalgia 08/12/2016  . Post laminectomy syndrome 08/12/2016  . Migraine with aura and without status migrainosus, not intractable 10/15/2015  . Intrinsic asthma 01/11/2010  . Dyspnea 12/14/2009  . DIABETES MELLITUS, TYPE II 12/11/2009  . Hyperlipidemia 12/11/2009  . MIGRAINE HEADACHE 12/11/2009  . Hereditary and idiopathic peripheral neuropathy 12/11/2009  . Hypertension 12/11/2009  . Insomnia 12/11/2009  . EDEMA 12/11/2009  . Type 2 diabetes mellitus with diabetic polyneuropathy ([place]) 12/11/2009    Stark Bray 11/17/2020, 11:50 AM  [place], [place],  [address] Phone: [phone]   Fax:  [phone]  Name: [name] MRN: [id number] Date of Birth: May 28, 1951

## 2020-11-17 NOTE — Progress Notes (Signed)
Oncology Nurse Navigator Documentation  Oncology Nurse Navigator Flowsheets 11/17/2020  Abnormal Finding Date -  Confirmed Diagnosis Date -  Diagnosis Status -  Planned Course of Treatment -  Phase of Treatment -  Expected Surgery Date -  Surgery Actual Start Date: -  Navigator Follow Up Date: 11/23/2020  Navigator Follow Up Reason: Other:  Navigator Location [facility]  Referral Date to RadOnc/MedOnc -  Navigator Encounter Type Appt/Treatment Plan Review  Telephone -  Treatment Initiated Date -  Patient Visit Type MedOnc  Treatment Phase Active Tx  Barriers/Navigation Needs Coordination of Care;Education  Education -  Interventions None Required  Acuity Level 2-Minimal Needs (1-2 Barriers Identified)  Referrals -  Coordination of Care -  Education Method -  Support Groups/Services Friends and Family  Time Spent with Patient 30

## 2020-11-17 NOTE — Progress Notes (Signed)
Radiation Oncology         [phone] ________________________________  Outpatient Follow Up - Conducted via telephone due to current COVID-19 concerns for limiting patient exposure  I spoke with the patient to conduct this consult visit via telephone to spare the patient unnecessary potential exposure in the healthcare setting during the current COVID-19 pandemic. The patient was notified in advance and was offered a [place] meeting to allow for face to face communication but unfortunately reported that they did not have the appropriate resources/technology to support such a visit and instead preferred to proceed with a telephone visit.    Name: [name]        MRN: [id number]  Date of Service: 11/17/2020 DOB: 1951-05-27  FM:BWGYK, Hal Hope, MD  [name], MD     REFERRING PHYSICIAN: [name], MD   DIAGNOSIS: The primary encounter diagnosis was Malignant neoplasm of upper-outer quadrant of right breast in female, estrogen receptor positive ([place]). A diagnosis of Malignant neoplasm of upper-outer quadrant of left breast in female, estrogen receptor positive ([place]) was also pertinent to this visit.   HISTORY OF PRESENT ILLNESS: [name] is a 70 y.o. female with  a new diagnosis of left breast cancer. The patient was noted to have a history of myeloma and has been managed with Dr. Marin Olp and off of systemic therapy since 2019. She recently on screening mammogram was found to have a mass in both breasts, by diagnostic imaging the left breast mass measured 6 x 9 x 6 mm was lobulated in the 1 o'clock position, there is no left axillary adenopathy.  The right mass measured 7 x 6 x 6 at the 2 o'clock position and again no adenopathy was identified.  She underwent biopsies of both masses on 08/19/2020 and her right breast mass was negative for malignancy consistent with usual ductal hyperplasia and fibrocystic change though the left breast specimen was consistent with an  invasive ductal carcinoma grade 2 with associated DCIS in her left cancer was ER/PR positive, HER-2 was negative with a Ki-67 of 2%.  Since her last visit she has undergone left lumpectomy with sentinel lymph node biopsy on 09/30/2020.  Final pathology revealed a grade 2 invasive ductal carcinoma measuring 1.1 cm with associated intermediate grade DCIS.  Calcifications were associated with carcinoma and carcinoma extended to the inked superior margin focally.  Additional medial margin showed residual DCIS involving the inked resection margin, and focal DCIS was noted in the inferior margin but margins were ultimately uninvolved by 2 mm, and no residual disease within the any additional deep margin.  She had 3 lymph nodes assessed and none contained metastatic disease fibrocystic change was also identified. Oncotype was 19 and no systemic therapy is indicated.  She did go back for reexcision on 10/19/2020 and additional superior margin was negative for invasive or in situ disease, foci of residual DCIS was seen in the left medial specimen showed focally less than 1 mm from the medial margin there was still some DCIS. She has completed with surgery, and ready to proceed with adjuvant therapy.    PREVIOUS RADIATION THERAPY: No   PAST MEDICAL HISTORY:  Past Medical History:  Diagnosis Date  . Allergies   . Anxiety   . Asthma    treated by pulmonologist   . Atherosclerosis of aorta ([place])    noted on xray  . Breast cancer ([place])    Right  . Chronic pain   . Depression   .  Diabetes ([place])    type II  . Edema   . Goals of care, counseling/discussion 09/02/2020  . High cholesterol   . History of nuclear stress test 11/12/2009   normal   . Hypertension   . IgG monoclonal gammopathy of uncertain significance    igG kappa monoclonal gammopathy of unkown significance (per notes from [employer]).  . Insomnia   . Low back pain   . Migraine headache   . Neuropathy associated with MGUS ([place])   .  Peripheral neuropathy   . PONV (postoperative nausea and vomiting)    hard to wake up  . Sleep apnea   . Smoldering multiple myeloma ([place]) 12/04/2017  . Vertigo        PAST SURGICAL HISTORY: Past Surgical History:  Procedure Laterality Date  . BACK SURGERY  2019   T10-S1 Fusion Dr. Patrice Paradise  . BACK SURGERY  2019   2 fusion in lower back (L4-S1) and neck  . breast cyst removal age 15    . BREAST LUMPECTOMY WITH RADIOACTIVE SEED AND SENTINEL LYMPH NODE BIOPSY Left 09/30/2020   Procedure: RADIOACTIVE SEED GUIDED LEFT BREAST LUMPECTOMY, LEFT AXILLARY SENTINEL LYMPH NODE BIOPSY;  Surgeon: Jovita Kussmaul, MD;  Location: [facility];  Service: General;  Laterality: Left;  . CESAREAN SECTION     x2  . hernia repair at 49 months old    . miscarriage D&C    . NASAL SINUS SURGERY     x2  . NECK SURGERY    . pain stimulator in back    . RE-EXCISION OF BREAST CANCER,SUPERIOR MARGINS Left 10/19/2020   Procedure: RE-EXCISION LEFT BREAST MARGINS;  Surgeon: Jovita Kussmaul, MD;  Location: [facility];  Service: General;  Laterality: Left;  . TONSILLECTOMY       FAMILY HISTORY:  Family History  Problem Relation Age of Onset  . Heart disease Mother   . CAD Mother   . Pneumonia Mother   . Leukemia Father   . Hepatitis Father   . Hypertension Sister   . Heart attack Sister   . Stroke Sister   . Stroke Maternal Grandfather   . Glaucoma Maternal Grandfather        "blind"  . Stroke Maternal Grandmother   . Other Maternal Grandmother        hardening of arteries  . Healthy Son   . Healthy Daughter   . Neuropathy Neg Hx        of what is known  . Diabetes Neg Hx        of what is known     SOCIAL HISTORY:  reports that she has never smoked. She has never used smokeless tobacco. She reports that she does not drink alcohol and does not use drugs. The patient is married and lives in [place].   ALLERGIES: Dristan [pheniramine-phenylephrine] and  Other   MEDICATIONS:  Current Outpatient Medications  Medication Sig Dispense Refill  . albuterol (VENTOLIN HFA) 108 (90 Base) MCG/ACT inhaler Inhale into the lungs every 6 (six) hours as needed for wheezing or shortness of breath.    . cyanocobalamin (,VITAMIN B-12,) 1000 MCG/ML injection Inject 1000 mcg (1 mL) into the muscle every 30 days for 6 months. 6 mL 0  . cyclobenzaprine (FLEXERIL) 5 MG tablet Take 5 mg by mouth 3 (three) times daily as needed.    Marland Kitchen HYDROcodone-acetaminophen (NORCO) 10-325 MG tablet TAKE 1 TABLET BY MOUTH EVERY SIX HOURS AS NEEDED DOSE CHANGE    .  metFORMIN (GLUCOPHAGE-XR) 500 MG 24 hr tablet Take 1,000 mg by mouth 2 (two) times daily.     . metoprolol (TOPROL-XL) 100 MG 24 hr tablet Take 100 mg by mouth daily.    Marland Kitchen morphine (MS CONTIN) 60 MG 12 hr tablet Take 60 mg by mouth every 12 (twelve) hours.   0  . pravastatin (PRAVACHOL) 40 MG tablet Take 40 mg by mouth daily.  1  . venlafaxine XR (EFFEXOR-XR) 150 MG 24 hr capsule Take 150 mg by mouth daily with breakfast.    . Accu-Chek Softclix Lancets lancets SMARTSIG:Topical (Patient not taking: Reported on 11/16/2020)    . Continuous Blood Gluc Receiver (FREESTYLE LIBRE READER) DEVI See admin instructions. (Patient not taking: Reported on 11/16/2020)    . Continuous Blood Gluc Sensor (FREESTYLE LIBRE 14 DAY SENSOR) MISC AS DIRECTED ONE EVERY 2 WEEKS (Patient not taking: Reported on 11/16/2020)     No current facility-administered medications for this encounter.     REVIEW OF SYSTEMS: On review of systems, the patient is doing well. She denies any concerns with healing. She has been reading a lot about treatment and is interested in being aggressive. No other complaints are verbalized.    PHYSICAL EXAM:  Wt Readings from Last 3 Encounters:  11/11/20 198 lb 6.4 oz (90 kg)  10/19/20 203 lb 11.3 oz (92.4 kg)  09/30/20 202 lb 13.2 oz (92 kg)   Unable to assess due to encounter type.   ECOG = 0  0 - Asymptomatic  (Fully active, able to carry on all predisease activities without restriction)  1 - Symptomatic but completely ambulatory (Restricted in physically strenuous activity but ambulatory and able to carry out work of a light or sedentary nature. For example, light housework, office work)  2 - Symptomatic, <50% in bed during the day (Ambulatory and capable of all self care but unable to carry out any work activities. Up and about more than 50% of waking hours)  3 - Symptomatic, >50% in bed, but not bedbound (Capable of only limited self-care, confined to bed or chair 50% or more of waking hours)  4 - Bedbound (Completely disabled. Cannot carry on any self-care. Totally confined to bed or chair)  5 - Death   Eustace Pen MM, Creech RH, Tormey DC, et al. [id number]). "Toxicity and response criteria of the [facility]". [place] Oncol. 5 (6): 649-55    LABORATORY DATA:  Lab Results  Component Value Date   WBC 10.9 (H) 11/11/2020   HGB 14.5 11/11/2020   HCT 44.6 11/11/2020   MCV 88.3 11/11/2020   PLT 385 11/11/2020   Lab Results  Component Value Date   NA 140 11/11/2020   K 3.6 11/11/2020   CL 99 11/11/2020   CO2 32 11/11/2020   Lab Results  Component Value Date   ALT 14 11/11/2020   AST 15 11/11/2020   ALKPHOS 55 11/11/2020   BILITOT 0.6 11/11/2020      RADIOGRAPHY: No results found.     IMPRESSION/PLAN: 1. Stage IA, pT1cN0M0 grade 2, ER/PR positive invasive ductal carcinoma of the left breast. Dr. Lisbeth Renshaw discusses the final pathology findings and reviews the nature of left breast disease. She has done well since surgery and is ready to proceed with external radiotherapy to the breast  to reduce risks of local recurrence followed by antiestrogen therapy. We discussed the risks, benefits, short, and long term effects of radiotherapy, as well as the curative intent, and the patient is  interested in proceeding. Dr. Lisbeth Renshaw discusses the delivery and logistics of  radiotherapy and recommends  4 weeks of radiotherapy. She will simulate on 11/23/20 at which time she will sign written consent to proceed. 2. History of multiple myeloma. The patient is in surveillance and will continue in this manner with Dr. Marin Olp.    Given current concerns for patient exposure during the COVID-19 pandemic, this encounter was conducted via telephone.  The patient has provided two factor identification and has given verbal consent for this type of encounter and has been advised to only accept a meeting of this type in a secure network environment. The time spent during this encounter was 45 minutes including preparation, discussion, and coordination of the patient's care. The attendants for this meeting include Blenda Nicely, RN, Dr. Lisbeth Renshaw, Hayden Pedro  and [place].  During the encounter,  Blenda Nicely, RN, Dr. Lisbeth Renshaw, and Hayden Pedro were located at [facility] Radiation Oncology Department.  Lavon DENIYA CRAIGO was located at home.    The above documentation reflects my direct findings during this shared patient visit. Please see the separate note by Dr. Lisbeth Renshaw on this date for the remainder of the patient's plan of care.

## 2020-11-18 DIAGNOSIS — M961 Postlaminectomy syndrome, not elsewhere classified: Secondary | ICD-10-CM | POA: Diagnosis not present

## 2020-11-18 DIAGNOSIS — E1142 Type 2 diabetes mellitus with diabetic polyneuropathy: Secondary | ICD-10-CM | POA: Diagnosis not present

## 2020-11-18 DIAGNOSIS — G894 Chronic pain syndrome: Secondary | ICD-10-CM | POA: Diagnosis not present

## 2020-11-18 DIAGNOSIS — Z79891 Long term (current) use of opiate analgesic: Secondary | ICD-10-CM | POA: Diagnosis not present

## 2020-11-23 ENCOUNTER — Encounter: Payer: Self-pay | Admitting: *Deleted

## 2020-11-23 ENCOUNTER — Other Ambulatory Visit: Payer: Self-pay

## 2020-11-23 ENCOUNTER — Ambulatory Visit
Admission: RE | Admit: 2020-11-23 | Discharge: 2020-11-23 | Disposition: A | Discharge status: Home / Self Care | Payer: BC Managed Care – PPO | Source: Ambulatory Visit | Attending: Radiation Oncology | Admitting: Radiation Oncology

## 2020-11-23 DIAGNOSIS — C50411 Malignant neoplasm of upper-outer quadrant of right female breast: Secondary | ICD-10-CM | POA: Diagnosis not present

## 2020-11-23 DIAGNOSIS — Z510 Encounter for antineoplastic radiation therapy: Secondary | ICD-10-CM | POA: Insufficient documentation

## 2020-11-23 DIAGNOSIS — C50412 Malignant neoplasm of upper-outer quadrant of left female breast: Secondary | ICD-10-CM | POA: Diagnosis not present

## 2020-11-23 DIAGNOSIS — Z170 Estrogen receptor positive status [ER+]: Secondary | ICD-10-CM | POA: Diagnosis not present

## 2020-11-23 NOTE — Progress Notes (Signed)
Oncology Nurse Navigator Documentation  Oncology Nurse Navigator Flowsheets 11/23/2020  Abnormal Finding Date -  Confirmed Diagnosis Date -  Diagnosis Status -  Planned Course of Treatment Radiation  Phase of Treatment Radiation  Expected Surgery Date -  Surgery Actual Start Date: -  Navigator Follow Up Date: 12/02/2020  Navigator Follow Up Reason: Radiation  Navigator Location [facility]  Referral Date to RadOnc/MedOnc -  Navigator Encounter Type Appt/Treatment Plan Review  Telephone -  Treatment Initiated Date -  Patient Visit Type MedOnc  Treatment Phase Active Tx  Barriers/Navigation Needs Coordination of Care;Education  Education -  Interventions None Required  Acuity Level 2-Minimal Needs (1-2 Barriers Identified)  Referrals -  Coordination of Care -  Education Method -  Support Groups/Services Friends and Family  Time Spent with Patient 15

## 2020-12-01 DIAGNOSIS — Z170 Estrogen receptor positive status [ER+]: Secondary | ICD-10-CM | POA: Diagnosis not present

## 2020-12-01 DIAGNOSIS — C50411 Malignant neoplasm of upper-outer quadrant of right female breast: Secondary | ICD-10-CM | POA: Diagnosis not present

## 2020-12-01 DIAGNOSIS — C50412 Malignant neoplasm of upper-outer quadrant of left female breast: Secondary | ICD-10-CM | POA: Diagnosis not present

## 2020-12-01 DIAGNOSIS — Z510 Encounter for antineoplastic radiation therapy: Secondary | ICD-10-CM | POA: Diagnosis not present

## 2020-12-02 ENCOUNTER — Ambulatory Visit
Admission: RE | Admit: 2020-12-02 | Discharge: 2020-12-02 | Disposition: A | Discharge status: Home / Self Care | Payer: BC Managed Care – PPO | Source: Ambulatory Visit | Attending: Radiation Oncology | Admitting: Radiation Oncology

## 2020-12-02 ENCOUNTER — Encounter: Payer: Self-pay | Admitting: *Deleted

## 2020-12-02 ENCOUNTER — Other Ambulatory Visit: Payer: Self-pay

## 2020-12-02 DIAGNOSIS — C50411 Malignant neoplasm of upper-outer quadrant of right female breast: Secondary | ICD-10-CM | POA: Diagnosis not present

## 2020-12-02 DIAGNOSIS — C50412 Malignant neoplasm of upper-outer quadrant of left female breast: Secondary | ICD-10-CM | POA: Diagnosis not present

## 2020-12-02 DIAGNOSIS — Z510 Encounter for antineoplastic radiation therapy: Secondary | ICD-10-CM | POA: Diagnosis not present

## 2020-12-02 DIAGNOSIS — Z170 Estrogen receptor positive status [ER+]: Secondary | ICD-10-CM | POA: Diagnosis not present

## 2020-12-02 NOTE — Progress Notes (Signed)
Oncology Nurse Navigator Documentation  Oncology Nurse Navigator Flowsheets 12/02/2020  Abnormal Finding Date -  Confirmed Diagnosis Date -  Diagnosis Status -  Planned Course of Treatment -  Phase of Treatment Radiation  Radiation Actual Start Date: 12/02/2020  Radiation Expected End Date: 12/29/2020  Expected Surgery Date -  Surgery Actual Start Date: -  Navigator Follow Up Date: 12/30/2020  Navigator Follow Up Reason: Follow-up Appointment  Navigator Location [facility]  Referral Date to RadOnc/MedOnc -  Navigator Encounter Type Appt/Treatment Plan Review  Telephone -  Treatment Initiated Date -  Patient Visit Type MedOnc  Treatment Phase Active Tx  Barriers/Navigation Needs Coordination of Care;Education  Education -  Interventions None Required  Acuity Level 2-Minimal Needs (1-2 Barriers Identified)  Referrals -  Coordination of Care -  Education Method -  Support Groups/Services Friends and Family  Time Spent with Patient 15

## 2020-12-03 ENCOUNTER — Ambulatory Visit: Payer: BC Managed Care – PPO

## 2020-12-03 NOTE — Progress Notes (Signed)
Pt here for patient teaching.  Pt given Radiation and You booklet, skin care instructions, Alra deodorant and Radiaplex gel.  Reviewed areas of pertinence such as fatigue, hair loss, skin changes, breast tenderness and breast swelling . Pt able to give teach back of to pat skin and use unscented/gentle soap,apply Radiaplex bid, avoid applying anything to skin within 4 hours of treatment, avoid wearing an under wire bra and to use an electric razor if they must shave. Pt verbalizes understanding of information given and will contact nursing with any questions or concerns.     Manmeet Arzola M. Lynnda Wiersma RN, BSN      

## 2020-12-04 ENCOUNTER — Ambulatory Visit
Admission: RE | Admit: 2020-12-04 | Discharge: 2020-12-04 | Disposition: A | Discharge status: Home / Self Care | Payer: BC Managed Care – PPO | Source: Ambulatory Visit | Attending: Radiation Oncology | Admitting: Radiation Oncology

## 2020-12-04 ENCOUNTER — Other Ambulatory Visit: Payer: Self-pay

## 2020-12-04 DIAGNOSIS — C50412 Malignant neoplasm of upper-outer quadrant of left female breast: Secondary | ICD-10-CM | POA: Diagnosis not present

## 2020-12-04 DIAGNOSIS — Z510 Encounter for antineoplastic radiation therapy: Secondary | ICD-10-CM | POA: Diagnosis not present

## 2020-12-04 DIAGNOSIS — Z170 Estrogen receptor positive status [ER+]: Secondary | ICD-10-CM

## 2020-12-04 DIAGNOSIS — C50411 Malignant neoplasm of upper-outer quadrant of right female breast: Secondary | ICD-10-CM | POA: Diagnosis not present

## 2020-12-04 MED ORDER — RADIAPLEXRX EX GEL: Freq: Once | CUTANEOUS | Status: AC

## 2020-12-04 MED ORDER — ALRA NON-METALLIC DEODORANT (RAD-ONC)
1.0000 "application " | Freq: Once | TOPICAL | Status: AC
  Administered 2020-12-04: 1 via TOPICAL

## 2020-12-07 ENCOUNTER — Ambulatory Visit
Admission: RE | Admit: 2020-12-07 | Discharge: 2020-12-07 | Disposition: A | Discharge status: Home / Self Care | Payer: BC Managed Care – PPO | Source: Ambulatory Visit | Attending: Radiation Oncology | Admitting: Radiation Oncology

## 2020-12-07 ENCOUNTER — Other Ambulatory Visit: Payer: Self-pay

## 2020-12-07 DIAGNOSIS — C50411 Malignant neoplasm of upper-outer quadrant of right female breast: Secondary | ICD-10-CM | POA: Diagnosis not present

## 2020-12-07 DIAGNOSIS — Z510 Encounter for antineoplastic radiation therapy: Secondary | ICD-10-CM | POA: Diagnosis not present

## 2020-12-07 DIAGNOSIS — C50412 Malignant neoplasm of upper-outer quadrant of left female breast: Secondary | ICD-10-CM | POA: Diagnosis not present

## 2020-12-07 DIAGNOSIS — Z170 Estrogen receptor positive status [ER+]: Secondary | ICD-10-CM | POA: Diagnosis not present

## 2020-12-08 ENCOUNTER — Ambulatory Visit
Admission: RE | Admit: 2020-12-08 | Discharge: 2020-12-08 | Disposition: A | Discharge status: Home / Self Care | Payer: BC Managed Care – PPO | Source: Ambulatory Visit | Attending: Radiation Oncology | Admitting: Radiation Oncology

## 2020-12-08 DIAGNOSIS — C50412 Malignant neoplasm of upper-outer quadrant of left female breast: Secondary | ICD-10-CM | POA: Diagnosis not present

## 2020-12-08 DIAGNOSIS — C50411 Malignant neoplasm of upper-outer quadrant of right female breast: Secondary | ICD-10-CM | POA: Diagnosis not present

## 2020-12-08 DIAGNOSIS — Z170 Estrogen receptor positive status [ER+]: Secondary | ICD-10-CM | POA: Diagnosis not present

## 2020-12-08 DIAGNOSIS — Z510 Encounter for antineoplastic radiation therapy: Secondary | ICD-10-CM | POA: Diagnosis not present

## 2020-12-09 ENCOUNTER — Ambulatory Visit
Admission: RE | Admit: 2020-12-09 | Discharge: 2020-12-09 | Disposition: A | Discharge status: Home / Self Care | Payer: BC Managed Care – PPO | Source: Ambulatory Visit | Attending: Radiation Oncology | Admitting: Radiation Oncology

## 2020-12-09 DIAGNOSIS — Z510 Encounter for antineoplastic radiation therapy: Secondary | ICD-10-CM | POA: Diagnosis not present

## 2020-12-09 DIAGNOSIS — C50412 Malignant neoplasm of upper-outer quadrant of left female breast: Secondary | ICD-10-CM | POA: Diagnosis not present

## 2020-12-09 DIAGNOSIS — C50411 Malignant neoplasm of upper-outer quadrant of right female breast: Secondary | ICD-10-CM | POA: Diagnosis not present

## 2020-12-09 DIAGNOSIS — Z170 Estrogen receptor positive status [ER+]: Secondary | ICD-10-CM | POA: Diagnosis not present

## 2020-12-10 ENCOUNTER — Other Ambulatory Visit: Payer: Self-pay

## 2020-12-10 ENCOUNTER — Ambulatory Visit
Admission: RE | Admit: 2020-12-10 | Discharge: 2020-12-10 | Disposition: A | Discharge status: Home / Self Care | Payer: BC Managed Care – PPO | Source: Ambulatory Visit | Attending: Radiation Oncology | Admitting: Radiation Oncology

## 2020-12-10 DIAGNOSIS — C50411 Malignant neoplasm of upper-outer quadrant of right female breast: Secondary | ICD-10-CM | POA: Diagnosis not present

## 2020-12-10 DIAGNOSIS — Z170 Estrogen receptor positive status [ER+]: Secondary | ICD-10-CM | POA: Diagnosis not present

## 2020-12-10 DIAGNOSIS — C50412 Malignant neoplasm of upper-outer quadrant of left female breast: Secondary | ICD-10-CM | POA: Diagnosis not present

## 2020-12-10 DIAGNOSIS — Z510 Encounter for antineoplastic radiation therapy: Secondary | ICD-10-CM | POA: Diagnosis not present

## 2020-12-11 ENCOUNTER — Other Ambulatory Visit: Payer: Self-pay

## 2020-12-11 ENCOUNTER — Ambulatory Visit
Admission: RE | Admit: 2020-12-11 | Discharge: 2020-12-11 | Disposition: A | Discharge status: Home / Self Care | Payer: BC Managed Care – PPO | Source: Ambulatory Visit | Attending: Radiation Oncology | Admitting: Radiation Oncology

## 2020-12-11 DIAGNOSIS — Z510 Encounter for antineoplastic radiation therapy: Secondary | ICD-10-CM | POA: Insufficient documentation

## 2020-12-11 DIAGNOSIS — C50412 Malignant neoplasm of upper-outer quadrant of left female breast: Secondary | ICD-10-CM | POA: Diagnosis not present

## 2020-12-11 DIAGNOSIS — Z170 Estrogen receptor positive status [ER+]: Secondary | ICD-10-CM | POA: Insufficient documentation

## 2020-12-11 DIAGNOSIS — C50411 Malignant neoplasm of upper-outer quadrant of right female breast: Secondary | ICD-10-CM | POA: Insufficient documentation

## 2020-12-14 ENCOUNTER — Ambulatory Visit
Admission: RE | Admit: 2020-12-14 | Discharge: 2020-12-14 | Disposition: A | Discharge status: Home / Self Care | Payer: BC Managed Care – PPO | Source: Ambulatory Visit | Attending: Radiation Oncology | Admitting: Radiation Oncology

## 2020-12-14 ENCOUNTER — Encounter: Payer: Self-pay | Admitting: *Deleted

## 2020-12-14 ENCOUNTER — Other Ambulatory Visit: Payer: Self-pay

## 2020-12-14 DIAGNOSIS — C50412 Malignant neoplasm of upper-outer quadrant of left female breast: Secondary | ICD-10-CM | POA: Diagnosis not present

## 2020-12-14 DIAGNOSIS — Z170 Estrogen receptor positive status [ER+]: Secondary | ICD-10-CM | POA: Diagnosis not present

## 2020-12-14 DIAGNOSIS — Z510 Encounter for antineoplastic radiation therapy: Secondary | ICD-10-CM | POA: Diagnosis not present

## 2020-12-14 DIAGNOSIS — C50411 Malignant neoplasm of upper-outer quadrant of right female breast: Secondary | ICD-10-CM | POA: Diagnosis not present

## 2020-12-15 ENCOUNTER — Ambulatory Visit
Admission: RE | Admit: 2020-12-15 | Discharge: 2020-12-15 | Disposition: A | Discharge status: Home / Self Care | Payer: BC Managed Care – PPO | Source: Ambulatory Visit | Attending: Radiation Oncology | Admitting: Radiation Oncology

## 2020-12-15 ENCOUNTER — Other Ambulatory Visit: Payer: Self-pay

## 2020-12-15 DIAGNOSIS — C50411 Malignant neoplasm of upper-outer quadrant of right female breast: Secondary | ICD-10-CM | POA: Diagnosis not present

## 2020-12-15 DIAGNOSIS — Z510 Encounter for antineoplastic radiation therapy: Secondary | ICD-10-CM | POA: Diagnosis not present

## 2020-12-15 DIAGNOSIS — Z170 Estrogen receptor positive status [ER+]: Secondary | ICD-10-CM | POA: Diagnosis not present

## 2020-12-15 DIAGNOSIS — C50412 Malignant neoplasm of upper-outer quadrant of left female breast: Secondary | ICD-10-CM | POA: Diagnosis not present

## 2020-12-16 ENCOUNTER — Ambulatory Visit
Admission: RE | Admit: 2020-12-16 | Discharge: 2020-12-16 | Disposition: A | Discharge status: Home / Self Care | Payer: BC Managed Care – PPO | Source: Ambulatory Visit | Attending: Radiation Oncology | Admitting: Radiation Oncology

## 2020-12-16 DIAGNOSIS — C50411 Malignant neoplasm of upper-outer quadrant of right female breast: Secondary | ICD-10-CM | POA: Diagnosis not present

## 2020-12-16 DIAGNOSIS — C50412 Malignant neoplasm of upper-outer quadrant of left female breast: Secondary | ICD-10-CM | POA: Diagnosis not present

## 2020-12-16 DIAGNOSIS — Z510 Encounter for antineoplastic radiation therapy: Secondary | ICD-10-CM | POA: Diagnosis not present

## 2020-12-16 DIAGNOSIS — Z170 Estrogen receptor positive status [ER+]: Secondary | ICD-10-CM | POA: Diagnosis not present

## 2020-12-17 ENCOUNTER — Ambulatory Visit: Payer: BC Managed Care – PPO

## 2020-12-18 ENCOUNTER — Other Ambulatory Visit: Payer: Self-pay

## 2020-12-18 ENCOUNTER — Ambulatory Visit
Admission: RE | Admit: 2020-12-18 | Discharge: 2020-12-18 | Disposition: A | Discharge status: Home / Self Care | Payer: BC Managed Care – PPO | Source: Ambulatory Visit | Attending: Radiation Oncology | Admitting: Radiation Oncology

## 2020-12-18 ENCOUNTER — Ambulatory Visit: Payer: BC Managed Care – PPO | Admitting: Radiation Oncology

## 2020-12-18 DIAGNOSIS — Z510 Encounter for antineoplastic radiation therapy: Secondary | ICD-10-CM | POA: Diagnosis not present

## 2020-12-18 DIAGNOSIS — C50411 Malignant neoplasm of upper-outer quadrant of right female breast: Secondary | ICD-10-CM | POA: Diagnosis not present

## 2020-12-18 DIAGNOSIS — C50412 Malignant neoplasm of upper-outer quadrant of left female breast: Secondary | ICD-10-CM | POA: Diagnosis not present

## 2020-12-18 DIAGNOSIS — Z170 Estrogen receptor positive status [ER+]: Secondary | ICD-10-CM | POA: Diagnosis not present

## 2020-12-20 DIAGNOSIS — C50411 Malignant neoplasm of upper-outer quadrant of right female breast: Secondary | ICD-10-CM | POA: Diagnosis not present

## 2020-12-20 DIAGNOSIS — Z170 Estrogen receptor positive status [ER+]: Secondary | ICD-10-CM | POA: Diagnosis not present

## 2020-12-20 DIAGNOSIS — C50412 Malignant neoplasm of upper-outer quadrant of left female breast: Secondary | ICD-10-CM | POA: Diagnosis not present

## 2020-12-20 DIAGNOSIS — Z510 Encounter for antineoplastic radiation therapy: Secondary | ICD-10-CM | POA: Diagnosis not present

## 2020-12-21 ENCOUNTER — Inpatient Hospital Stay: Payer: BC Managed Care – PPO | Admitting: Licensed Clinical Social Worker

## 2020-12-21 ENCOUNTER — Other Ambulatory Visit: Payer: Self-pay

## 2020-12-21 ENCOUNTER — Ambulatory Visit
Admission: RE | Admit: 2020-12-21 | Discharge: 2020-12-21 | Disposition: A | Discharge status: Home / Self Care | Payer: BC Managed Care – PPO | Source: Ambulatory Visit | Attending: Radiation Oncology | Admitting: Radiation Oncology

## 2020-12-21 DIAGNOSIS — C50411 Malignant neoplasm of upper-outer quadrant of right female breast: Secondary | ICD-10-CM | POA: Diagnosis not present

## 2020-12-21 DIAGNOSIS — C50412 Malignant neoplasm of upper-outer quadrant of left female breast: Secondary | ICD-10-CM | POA: Diagnosis not present

## 2020-12-21 DIAGNOSIS — Z510 Encounter for antineoplastic radiation therapy: Secondary | ICD-10-CM | POA: Diagnosis not present

## 2020-12-21 DIAGNOSIS — Z170 Estrogen receptor positive status [ER+]: Secondary | ICD-10-CM | POA: Diagnosis not present

## 2020-12-22 ENCOUNTER — Inpatient Hospital Stay: Payer: BC Managed Care – PPO | Attending: Hematology & Oncology | Admitting: *Deleted

## 2020-12-22 ENCOUNTER — Ambulatory Visit
Admission: RE | Admit: 2020-12-22 | Discharge: 2020-12-22 | Disposition: A | Discharge status: Home / Self Care | Payer: BC Managed Care – PPO | Source: Ambulatory Visit | Attending: Radiation Oncology | Admitting: Radiation Oncology

## 2020-12-22 DIAGNOSIS — R0602 Shortness of breath: Secondary | ICD-10-CM | POA: Insufficient documentation

## 2020-12-22 DIAGNOSIS — Z79899 Other long term (current) drug therapy: Secondary | ICD-10-CM | POA: Insufficient documentation

## 2020-12-22 DIAGNOSIS — C50411 Malignant neoplasm of upper-outer quadrant of right female breast: Secondary | ICD-10-CM | POA: Diagnosis not present

## 2020-12-22 DIAGNOSIS — M7989 Other specified soft tissue disorders: Secondary | ICD-10-CM | POA: Insufficient documentation

## 2020-12-22 DIAGNOSIS — R002 Palpitations: Secondary | ICD-10-CM | POA: Insufficient documentation

## 2020-12-22 DIAGNOSIS — R5383 Other fatigue: Secondary | ICD-10-CM | POA: Insufficient documentation

## 2020-12-22 DIAGNOSIS — D472 Monoclonal gammopathy: Secondary | ICD-10-CM | POA: Insufficient documentation

## 2020-12-22 DIAGNOSIS — K5900 Constipation, unspecified: Secondary | ICD-10-CM | POA: Insufficient documentation

## 2020-12-22 DIAGNOSIS — R12 Heartburn: Secondary | ICD-10-CM | POA: Insufficient documentation

## 2020-12-22 DIAGNOSIS — Z510 Encounter for antineoplastic radiation therapy: Secondary | ICD-10-CM | POA: Diagnosis not present

## 2020-12-22 DIAGNOSIS — Z170 Estrogen receptor positive status [ER+]: Secondary | ICD-10-CM

## 2020-12-22 DIAGNOSIS — M791 Myalgia, unspecified site: Secondary | ICD-10-CM | POA: Insufficient documentation

## 2020-12-22 DIAGNOSIS — G629 Polyneuropathy, unspecified: Secondary | ICD-10-CM | POA: Insufficient documentation

## 2020-12-22 DIAGNOSIS — C50412 Malignant neoplasm of upper-outer quadrant of left female breast: Secondary | ICD-10-CM | POA: Insufficient documentation

## 2020-12-22 DIAGNOSIS — Z923 Personal history of irradiation: Secondary | ICD-10-CM | POA: Insufficient documentation

## 2020-12-22 DIAGNOSIS — E1140 Type 2 diabetes mellitus with diabetic neuropathy, unspecified: Secondary | ICD-10-CM | POA: Insufficient documentation

## 2020-12-22 DIAGNOSIS — D510 Vitamin B12 deficiency anemia due to intrinsic factor deficiency: Secondary | ICD-10-CM | POA: Insufficient documentation

## 2020-12-22 DIAGNOSIS — M2550 Pain in unspecified joint: Secondary | ICD-10-CM | POA: Insufficient documentation

## 2020-12-22 NOTE — Progress Notes (Deleted)
[place] Work  Clinical Social Work met with patient in [place] office at [employer].  CSW and patient discussed patients request for counseling resources.  CSW offered supportive listening as patient described her feelings and experience with grief.  Patient has participated in grief counseling and support groups after the death of her son.  Patient would like to explore additional options for individual and group support.  CSW provided patient with a list of counselors and support groups in the community.  Patient plans to contact providers and will contact CSW as needed.  CSW and patient also discussed [place] support programs and groups.  Patient expressed interest in support programs and will call to register.  CSW provided contact information and encouraged patient to call with questions or concerns.         Johnnye Lana, MSW, LCSW, OSW-C Clinical Social Worker [facility] [phone]

## 2020-12-22 NOTE — Progress Notes (Deleted)
[place] Work  Clinical Social Work met with patient in [place] office at [employer].  CSW and patient discussed patients request for counseling resources.  CSW offered supportive listening as patient described her feelings and experience with grief.  Patient has participated in grief counseling and support groups after the death of her son.  Patient would like to explore additional options for individual and group support.  CSW provided patient with a list of counselors and support groups in the community.  Patient plans to contact providers and will contact CSW as needed.  CSW and patient also discussed [place] support programs and groups.  Patient expressed interest in support programs and will call to register.  CSW provided contact information and encouraged patient to call with questions or concerns.         Johnnye Lana, MSW, LCSW, OSW-C Clinical Social Worker [facility] [phone]

## 2020-12-22 NOTE — Progress Notes (Signed)
[place] Work  Clinical Social Work met with patient in [place] office at [employer].  CSW and patient discussed patients request for counseling resources.  CSW offered supportive listening as patient described her feelings and experience with grief.  Patient has participated in grief counseling and support groups after the death of her son.  Patient would like to explore additional options for individual and group support.  CSW provided patient with a list of counselors and support groups in the community.  Patient plans to contact providers and will contact CSW as needed.  CSW and patient also discussed [place] support programs and groups.  Patient expressed interest in support programs and will call to register.  CSW provided contact information and encouraged patient to call with questions or concerns.         Johnnye Lana, MSW, LCSW, OSW-C Clinical Social Worker [facility] [phone]

## 2020-12-23 ENCOUNTER — Ambulatory Visit
Admission: RE | Admit: 2020-12-23 | Discharge: 2020-12-23 | Disposition: A | Discharge status: Home / Self Care | Payer: BC Managed Care – PPO | Source: Ambulatory Visit | Attending: Radiation Oncology | Admitting: Radiation Oncology

## 2020-12-23 ENCOUNTER — Other Ambulatory Visit: Payer: Self-pay

## 2020-12-23 DIAGNOSIS — C50412 Malignant neoplasm of upper-outer quadrant of left female breast: Secondary | ICD-10-CM | POA: Diagnosis not present

## 2020-12-23 DIAGNOSIS — Z170 Estrogen receptor positive status [ER+]: Secondary | ICD-10-CM | POA: Diagnosis not present

## 2020-12-23 DIAGNOSIS — C50411 Malignant neoplasm of upper-outer quadrant of right female breast: Secondary | ICD-10-CM | POA: Diagnosis not present

## 2020-12-23 DIAGNOSIS — Z510 Encounter for antineoplastic radiation therapy: Secondary | ICD-10-CM | POA: Diagnosis not present

## 2020-12-24 ENCOUNTER — Ambulatory Visit: Payer: BC Managed Care – PPO

## 2020-12-24 ENCOUNTER — Ambulatory Visit
Admission: RE | Admit: 2020-12-24 | Discharge: 2020-12-24 | Disposition: A | Discharge status: Home / Self Care | Payer: BC Managed Care – PPO | Source: Ambulatory Visit | Attending: Radiation Oncology | Admitting: Radiation Oncology

## 2020-12-24 DIAGNOSIS — C50411 Malignant neoplasm of upper-outer quadrant of right female breast: Secondary | ICD-10-CM | POA: Diagnosis not present

## 2020-12-24 DIAGNOSIS — C50412 Malignant neoplasm of upper-outer quadrant of left female breast: Secondary | ICD-10-CM | POA: Diagnosis not present

## 2020-12-24 DIAGNOSIS — Z170 Estrogen receptor positive status [ER+]: Secondary | ICD-10-CM | POA: Diagnosis not present

## 2020-12-24 DIAGNOSIS — Z510 Encounter for antineoplastic radiation therapy: Secondary | ICD-10-CM | POA: Diagnosis not present

## 2020-12-25 ENCOUNTER — Ambulatory Visit: Payer: BC Managed Care – PPO

## 2020-12-25 ENCOUNTER — Ambulatory Visit
Admission: RE | Admit: 2020-12-25 | Discharge: 2020-12-25 | Disposition: A | Discharge status: Home / Self Care | Payer: BC Managed Care – PPO | Source: Ambulatory Visit | Attending: Radiation Oncology | Admitting: Radiation Oncology

## 2020-12-25 DIAGNOSIS — Z510 Encounter for antineoplastic radiation therapy: Secondary | ICD-10-CM | POA: Diagnosis not present

## 2020-12-25 DIAGNOSIS — C50411 Malignant neoplasm of upper-outer quadrant of right female breast: Secondary | ICD-10-CM | POA: Diagnosis not present

## 2020-12-25 DIAGNOSIS — C50412 Malignant neoplasm of upper-outer quadrant of left female breast: Secondary | ICD-10-CM | POA: Diagnosis not present

## 2020-12-25 DIAGNOSIS — Z170 Estrogen receptor positive status [ER+]: Secondary | ICD-10-CM | POA: Diagnosis not present

## 2020-12-28 ENCOUNTER — Ambulatory Visit: Payer: BC Managed Care – PPO

## 2020-12-29 ENCOUNTER — Ambulatory Visit
Admission: RE | Admit: 2020-12-29 | Discharge: 2020-12-29 | Disposition: A | Discharge status: Home / Self Care | Payer: BC Managed Care – PPO | Source: Ambulatory Visit | Attending: Radiation Oncology | Admitting: Radiation Oncology

## 2020-12-29 ENCOUNTER — Other Ambulatory Visit: Payer: Self-pay

## 2020-12-29 ENCOUNTER — Ambulatory Visit: Payer: BC Managed Care – PPO

## 2020-12-29 DIAGNOSIS — L910 Hypertrophic scar: Secondary | ICD-10-CM | POA: Diagnosis not present

## 2020-12-29 DIAGNOSIS — Z170 Estrogen receptor positive status [ER+]: Secondary | ICD-10-CM | POA: Diagnosis not present

## 2020-12-29 DIAGNOSIS — L821 Other seborrheic keratosis: Secondary | ICD-10-CM | POA: Diagnosis not present

## 2020-12-29 DIAGNOSIS — D692 Other nonthrombocytopenic purpura: Secondary | ICD-10-CM | POA: Diagnosis not present

## 2020-12-29 DIAGNOSIS — C50411 Malignant neoplasm of upper-outer quadrant of right female breast: Secondary | ICD-10-CM | POA: Diagnosis not present

## 2020-12-29 DIAGNOSIS — Z510 Encounter for antineoplastic radiation therapy: Secondary | ICD-10-CM | POA: Diagnosis not present

## 2020-12-29 DIAGNOSIS — C50412 Malignant neoplasm of upper-outer quadrant of left female breast: Secondary | ICD-10-CM | POA: Diagnosis not present

## 2020-12-30 ENCOUNTER — Encounter: Payer: Self-pay | Admitting: *Deleted

## 2020-12-30 ENCOUNTER — Encounter: Payer: Self-pay | Admitting: Hematology & Oncology

## 2020-12-30 ENCOUNTER — Inpatient Hospital Stay (HOSPITAL_BASED_OUTPATIENT_CLINIC_OR_DEPARTMENT_OTHER): Payer: BC Managed Care – PPO | Admitting: Hematology & Oncology

## 2020-12-30 ENCOUNTER — Ambulatory Visit: Payer: BC Managed Care – PPO

## 2020-12-30 ENCOUNTER — Inpatient Hospital Stay: Payer: BC Managed Care – PPO

## 2020-12-30 ENCOUNTER — Ambulatory Visit
Admission: RE | Admit: 2020-12-30 | Discharge: 2020-12-30 | Disposition: A | Discharge status: Home / Self Care | Payer: BC Managed Care – PPO | Source: Ambulatory Visit | Attending: Radiation Oncology | Admitting: Radiation Oncology

## 2020-12-30 VITALS — BP 121/70 | HR 79 | Temp 99.0°F | Resp 18 | Wt 201.0 lb

## 2020-12-30 DIAGNOSIS — Z510 Encounter for antineoplastic radiation therapy: Secondary | ICD-10-CM | POA: Diagnosis not present

## 2020-12-30 DIAGNOSIS — Z923 Personal history of irradiation: Secondary | ICD-10-CM | POA: Diagnosis not present

## 2020-12-30 DIAGNOSIS — C50411 Malignant neoplasm of upper-outer quadrant of right female breast: Secondary | ICD-10-CM | POA: Diagnosis not present

## 2020-12-30 DIAGNOSIS — M7989 Other specified soft tissue disorders: Secondary | ICD-10-CM | POA: Diagnosis not present

## 2020-12-30 DIAGNOSIS — C50412 Malignant neoplasm of upper-outer quadrant of left female breast: Secondary | ICD-10-CM | POA: Diagnosis not present

## 2020-12-30 DIAGNOSIS — M2550 Pain in unspecified joint: Secondary | ICD-10-CM | POA: Diagnosis not present

## 2020-12-30 DIAGNOSIS — R12 Heartburn: Secondary | ICD-10-CM | POA: Diagnosis not present

## 2020-12-30 DIAGNOSIS — Z79899 Other long term (current) drug therapy: Secondary | ICD-10-CM | POA: Diagnosis not present

## 2020-12-30 DIAGNOSIS — C9000 Multiple myeloma not having achieved remission: Secondary | ICD-10-CM

## 2020-12-30 DIAGNOSIS — D472 Monoclonal gammopathy: Secondary | ICD-10-CM

## 2020-12-30 DIAGNOSIS — D510 Vitamin B12 deficiency anemia due to intrinsic factor deficiency: Secondary | ICD-10-CM | POA: Diagnosis not present

## 2020-12-30 DIAGNOSIS — E1140 Type 2 diabetes mellitus with diabetic neuropathy, unspecified: Secondary | ICD-10-CM | POA: Diagnosis not present

## 2020-12-30 DIAGNOSIS — R0602 Shortness of breath: Secondary | ICD-10-CM | POA: Diagnosis not present

## 2020-12-30 DIAGNOSIS — Z170 Estrogen receptor positive status [ER+]: Secondary | ICD-10-CM

## 2020-12-30 DIAGNOSIS — R5383 Other fatigue: Secondary | ICD-10-CM | POA: Diagnosis not present

## 2020-12-30 DIAGNOSIS — M791 Myalgia, unspecified site: Secondary | ICD-10-CM | POA: Diagnosis not present

## 2020-12-30 DIAGNOSIS — G629 Polyneuropathy, unspecified: Secondary | ICD-10-CM | POA: Diagnosis not present

## 2020-12-30 DIAGNOSIS — R002 Palpitations: Secondary | ICD-10-CM | POA: Diagnosis not present

## 2020-12-30 DIAGNOSIS — K5900 Constipation, unspecified: Secondary | ICD-10-CM | POA: Diagnosis not present

## 2020-12-30 LAB — CBC WITH DIFFERENTIAL (CANCER CENTER ONLY)
Abs Immature Granulocytes: 0.03 10*3/uL (ref 0.00–0.07)
Basophils Absolute: 0.1 10*3/uL (ref 0.0–0.1)
Basophils Relative: 1 %
Eosinophils Absolute: 0.3 10*3/uL (ref 0.0–0.5)
Eosinophils Relative: 3 %
HCT: 42 % (ref 36.0–46.0)
Hemoglobin: 13.6 g/dL (ref 12.0–15.0)
Immature Granulocytes: 0 %
Lymphocytes Relative: 31 %
Lymphs Abs: 2.8 10*3/uL (ref 0.7–4.0)
MCH: 28.8 pg (ref 26.0–34.0)
MCHC: 32.4 g/dL (ref 30.0–36.0)
MCV: 89 fL (ref 80.0–100.0)
Monocytes Absolute: 0.9 10*3/uL (ref 0.1–1.0)
Monocytes Relative: 10 %
Neutro Abs: 5.1 10*3/uL (ref 1.7–7.7)
Neutrophils Relative %: 55 %
Platelet Count: 318 10*3/uL (ref 150–400)
RBC: 4.72 MIL/uL (ref 3.87–5.11)
RDW: 12.5 % (ref 11.5–15.5)
WBC Count: 9.2 10*3/uL (ref 4.0–10.5)
nRBC: 0 % (ref 0.0–0.2)

## 2020-12-30 LAB — CMP (CANCER CENTER ONLY)
ALT: 14 U/L (ref 0–44)
AST: 18 U/L (ref 15–41)
Albumin: 4.4 g/dL (ref 3.5–5.0)
Alkaline Phosphatase: 56 U/L (ref 38–126)
Anion gap: 8 (ref 5–15)
BUN: 17 mg/dL (ref 8–23)
CO2: 32 mmol/L (ref 22–32)
Calcium: 10 mg/dL (ref 8.9–10.3)
Chloride: 100 mmol/L (ref 98–111)
Creatinine: 0.85 mg/dL (ref 0.44–1.00)
GFR, Estimated: >60 mL/min (ref >60)
Glucose, Bld: 113 mg/dL — ABNORMAL HIGH (ref 70–99)
Potassium: 4.1 mmol/L (ref 3.5–5.1)
Sodium: 140 mmol/L (ref 135–145)
Total Bilirubin: 0.6 mg/dL (ref 0.3–1.2)
Total Protein: 7.8 g/dL (ref 6.5–8.1)

## 2020-12-30 LAB — LACTATE DEHYDROGENASE: LDH: 157 U/L (ref 98–192)

## 2020-12-30 NOTE — Progress Notes (Signed)
Hematology and Oncology Follow Up Visit  [name] [id number] June 29, 1951 70 y.o. 12/30/2020   Principle Diagnosis:  1. Stage IA (T1cN0M0) infiltrating ductal carcinoma of the LEFT breast -- ER+/PR+/HER2- -- Oncotype = 16 2. IgG Kappa smoldering myeloma 3. Chronic neuropathy 4. Pernicious Anemia  Current Therapy:   Velcade q week dosing (3/1) -- s/p cycle #2 -- d/c on 02/06/2018 for lack of effectiveness Vit B12 1000 mcg IM q month - start on 03/25/2020 S/p XRT to the LEFT breast -- completed on 01/01/2021 Femara 2.5 mg po q day -- start on 01/19/2021  Interim History:  [name] is here today for follow-up.  So far, she is doing well with radiation.  She has 2 more days left of radiation.  She really has had very little in the way of skin toxicity.  She has had no problems with pain.  She has had some neuropathy which is chronic for her..  There is been no issues with cough or shortness of breath.  She has had no nausea or vomiting.  There is been no change in bowel or bladder habits.  Currently, I would say performance status is ECOG 1.    Medications:  Allergies as of 12/30/2020      Reactions   Dristan [pheniramine-phenylephrine] Palpitations   Other Palpitations   Contact "old cold medicine      Medication List       Accurate as of December 30, 2020  1:04 PM. If you have any questions, ask your nurse or doctor.        Accu-Chek Softclix Lancets lancets   albuterol 108 (90 Base) MCG/ACT inhaler Commonly known as: VENTOLIN HFA Inhale into the lungs every 6 (six) hours as needed for wheezing or shortness of breath.   amitriptyline 10 MG tablet Commonly known as: ELAVIL Take 10 mg by mouth at bedtime.   cyanocobalamin 1000 MCG/ML injection Commonly known as: (VITAMIN B-12) Inject 1000 mcg (1 mL) into the muscle every 30 days for 6 months.   cyclobenzaprine 5 MG tablet Commonly known as: FLEXERIL Take 5 mg by mouth 3 (three) times daily as needed.    FreeStyle [employer] [place] Kerrin Mo See admin instructions.   HYDROcodone-acetaminophen 10-325 MG tablet Commonly known as: NORCO TAKE 1 TABLET BY MOUTH EVERY SIX HOURS AS NEEDED DOSE CHANGE   metFORMIN 500 MG 24 hr tablet Commonly known as: GLUCOPHAGE-XR Take 1,000 mg by mouth 2 (two) times daily.   metoprolol succinate 100 MG 24 hr tablet Commonly known as: TOPROL-XL Take 100 mg by mouth daily.   morphine 60 MG 12 hr tablet Commonly known as: MS CONTIN Take 60 mg by mouth every 12 (twelve) hours.   morphine 60 MG 24 hr capsule Commonly known as: AVINZA 1 capsule   pravastatin 40 MG tablet Commonly known as: PRAVACHOL Take 40 mg by mouth daily.   venlafaxine XR 150 MG 24 hr capsule Commonly known as: EFFEXOR-XR Take 150 mg by mouth daily with breakfast.       Allergies:  Allergies  Allergen Reactions  . Dristan [Pheniramine-Phenylephrine] Palpitations  . Other Palpitations    Contact "old cold medicine    Past Medical History, Surgical history, Social history, and Family History were reviewed and updated.  Review of Systems: Review of Systems  Constitutional: Positive for malaise/fatigue.  HENT: Negative.   Eyes: Negative.   Respiratory: Positive for shortness of breath.   Cardiovascular: Positive for palpitations and  leg swelling.  Gastrointestinal: Positive for constipation and heartburn.  Genitourinary: Negative.   Musculoskeletal: Positive for joint pain and myalgias.  Skin: Negative.   Neurological: Positive for tingling.  Endo/Heme/Allergies: Negative.   Psychiatric/Behavioral: Negative.      Physical Exam:  weight is 201 lb (91.2 kg). Her oral temperature is 99 F (37.2 C). Her blood pressure is 121/70 and her pulse is 79. Her respiration is 18 and oxygen saturation is 99%.   Wt Readings from Last 3 Encounters:  12/30/20 201 lb (91.2 kg)  11/11/20 198 lb 6.4 oz (90 kg)  10/19/20 203 lb 11.3 oz (92.4 kg)  Is  1 weight 1 1% patient is no  Physical Exam Vitals reviewed.  Constitutional:      Comments: On her breast exam, the right breast shows the biopsy site to be well-healed.  There is no distinct mass in the right breast.  There is no right axillary adenopathy.  Left breast shows the lumpectomy scar at the 1 o'clock position.  She has some slight erythema on the left breast from radiation.  There is no actual skin breakdown.  This is at the edge of the areola.  She has a well healing lymphadenectomy scar in the left axilla.  There is no axillary adenopathy in the left axilla.  HENT:     Head: Normocephalic and atraumatic.  Eyes:     Pupils: Pupils are equal, round, and reactive to light.  Cardiovascular:     Rate and Rhythm: Normal rate and regular rhythm.     Heart sounds: Normal heart sounds.  Pulmonary:     Effort: Pulmonary effort is normal.     Breath sounds: Normal breath sounds.  Abdominal:     General: Bowel sounds are normal.     Palpations: Abdomen is soft.  Musculoskeletal:        General: No tenderness or deformity. Normal range of motion.     Cervical back: Normal range of motion.  Lymphadenopathy:     Cervical: No cervical adenopathy.  Skin:    General: Skin is warm and dry.     Findings: No erythema or rash.  Neurological:     Mental Status: She is alert and oriented to person, place, and time.  Psychiatric:        Behavior: Behavior normal.        Thought Content: Thought content normal.        Judgment: Judgment normal.     Lab Results  Component Value Date   WBC 9.2 12/30/2020   HGB 13.6 12/30/2020   HCT 42.0 12/30/2020   MCV 89.0 12/30/2020   PLT 318 12/30/2020   Lab Results  Component Value Date   FERRITIN 55 10/03/2019   IRON 68 10/03/2019   TIBC 389 10/03/2019   UIBC 321 10/03/2019   IRONPCTSAT 17 (L) 10/03/2019   Lab Results  Component Value Date   RBC 4.72 12/30/2020   Lab Results  Component Value Date   KPAFRELGTCHN 39.5 (H) 09/02/2020    LAMBDASER 26.9 (H) 09/02/2020   KAPLAMBRATIO 1.47 09/02/2020   Lab Results  Component Value Date   IGGSERUM 1,952 (H) 09/02/2020   IGA 83 (L) 09/02/2020   IGMSERUM 66 09/02/2020   Lab Results  Component Value Date   TOTALPROTELP 8.0 09/02/2020   ALBUMINELP 4.1 09/02/2020   A1GS 0.2 09/02/2020   A2GS 1.0 09/02/2020   BETS 1.2 09/02/2020   BETA2SER 0.3 02/20/2015   GAMS 1.6 09/02/2020   MSPIKE  1.1 (H) 09/02/2020   SPEI * 02/20/2015     Chemistry      Component Value Date/Time   NA 140 12/30/2020 1209   NA 139 03/23/2020 1220   NA 140 03/27/2017 0804   K 4.1 12/30/2020 1209   K 3.7 03/27/2017 0804   CL 100 12/30/2020 1209   CL 94 (L) 02/20/2015 1010   CO2 32 12/30/2020 1209   CO2 28 03/27/2017 0804   BUN 17 12/30/2020 1209   BUN 18 03/23/2020 1220   BUN 14.9 03/27/2017 0804   CREATININE 0.85 12/30/2020 1209   CREATININE 0.9 03/27/2017 0804      Component Value Date/Time   CALCIUM 10.0 12/30/2020 1209   CALCIUM 10.0 03/27/2017 0804   ALKPHOS 56 12/30/2020 1209   ALKPHOS 69 03/27/2017 0804   AST 18 12/30/2020 1209   AST 26 03/27/2017 0804   ALT 14 12/30/2020 1209   ALT 28 03/27/2017 0804   BILITOT 0.6 12/30/2020 1209   BILITOT 0.76 03/27/2017 0804     Impression and Plan: Ms. Gadea is 70 yo postmenopausal white female with smoldering myeloma and also chronic neuropathy due to diabetes and chronic back issues.   She now has a stage Ia ductal carcinoma of the left breast.  She underwent a lumpectomy.  She has a low Oncotype score.  I really doubt that she is going to benefit from any chemotherapy.  She will finish radiation therapy this week.  I do think we have to put her on Femara.  I think this would be a very reasonable option for her.  I would not start this for about 3 weeks.  I would let her recover from the radiation.  I told her about some of the side effects of Femara.  I told her to start taking vitamin D 2000 units daily.  This may help with some  of the arthralgias that she may have.  I would like to see her back in about 6 weeks or so.  If all is good we will see her back, then maybe we can get her through the summertime.  We also have to make sure we monitor the monoclonal gammopathy.  We will see what her monoclonal studies look like when we see her back.     [name], MD 4/20/20221:04 PM

## 2020-12-30 NOTE — Progress Notes (Signed)
Oncology Nurse Navigator Documentation  Oncology Nurse Navigator Flowsheets 12/30/2020  Abnormal Finding Date -  Confirmed Diagnosis Date -  Diagnosis Status -  Planned Course of Treatment -  Phase of Treatment -  Radiation Actual Start Date: -  Radiation Expected End Date: -  Expected Surgery Date 01/01/2021  Surgery Actual Start Date: -  Navigator Follow Up Date: 02/17/2021  Navigator Follow Up Reason: Follow-up Appointment  Navigator Location [facility]  Referral Date to RadOnc/MedOnc -  Navigator Encounter Type Appt/Treatment Plan Review  Telephone -  Treatment Initiated Date -  Patient Visit Type MedOnc  Treatment Phase Active Tx  Barriers/Navigation Needs Coordination of Care;Education  Education -  Interventions None Required  Acuity Level 2-Minimal Needs (1-2 Barriers Identified)  Referrals -  Coordination of Care -  Education Method -  Support Groups/Services Friends and Family  Time Spent with Patient 15

## 2020-12-31 ENCOUNTER — Ambulatory Visit
Admission: RE | Admit: 2020-12-31 | Discharge: 2020-12-31 | Disposition: A | Discharge status: Home / Self Care | Payer: BC Managed Care – PPO | Source: Ambulatory Visit | Attending: Radiation Oncology | Admitting: Radiation Oncology

## 2020-12-31 ENCOUNTER — Ambulatory Visit: Payer: BC Managed Care – PPO

## 2020-12-31 ENCOUNTER — Other Ambulatory Visit: Payer: Self-pay

## 2020-12-31 DIAGNOSIS — C50411 Malignant neoplasm of upper-outer quadrant of right female breast: Secondary | ICD-10-CM | POA: Diagnosis not present

## 2020-12-31 DIAGNOSIS — C50412 Malignant neoplasm of upper-outer quadrant of left female breast: Secondary | ICD-10-CM | POA: Diagnosis not present

## 2020-12-31 DIAGNOSIS — Z510 Encounter for antineoplastic radiation therapy: Secondary | ICD-10-CM | POA: Diagnosis not present

## 2020-12-31 DIAGNOSIS — Z170 Estrogen receptor positive status [ER+]: Secondary | ICD-10-CM | POA: Diagnosis not present

## 2020-12-31 LAB — IGG, IGA, IGM
IgA: 66 mg/dL — ABNORMAL LOW (ref 87–352)
IgG (Immunoglobin G), Serum: 1408 mg/dL (ref 586–1602)
IgM (Immunoglobulin M), Srm: 49 mg/dL (ref 26–217)

## 2020-12-31 LAB — KAPPA/LAMBDA LIGHT CHAINS
Kappa free light chain: 30.2 mg/L — ABNORMAL HIGH (ref 3.3–19.4)
Kappa, lambda light chain ratio: 1.27 (ref 0.26–1.65)
Lambda free light chains: 23.7 mg/L (ref 5.7–26.3)

## 2021-01-01 ENCOUNTER — Encounter: Payer: Self-pay | Admitting: Radiation Oncology

## 2021-01-01 ENCOUNTER — Ambulatory Visit
Admission: RE | Admit: 2021-01-01 | Discharge: 2021-01-01 | Disposition: A | Discharge status: Home / Self Care | Payer: BC Managed Care – PPO | Source: Ambulatory Visit | Attending: Radiation Oncology | Admitting: Radiation Oncology

## 2021-01-01 DIAGNOSIS — C50411 Malignant neoplasm of upper-outer quadrant of right female breast: Secondary | ICD-10-CM | POA: Diagnosis not present

## 2021-01-01 DIAGNOSIS — C50412 Malignant neoplasm of upper-outer quadrant of left female breast: Secondary | ICD-10-CM | POA: Diagnosis not present

## 2021-01-01 DIAGNOSIS — Z170 Estrogen receptor positive status [ER+]: Secondary | ICD-10-CM | POA: Diagnosis not present

## 2021-01-01 DIAGNOSIS — Z510 Encounter for antineoplastic radiation therapy: Secondary | ICD-10-CM | POA: Diagnosis not present

## 2021-01-03 NOTE — Progress Notes (Signed)
  Patient Name: [name] MRN: [id number] DOB: [birth year] Referring Physician: Burney Gauze (Profile Not Attached) Date of Service: 01/01/2021 [facility], [place]                                                        End Of Treatment Note  Diagnoses: C50.411-Malignant neoplasm of upper-outer quadrant of right female breast  Cancer Staging:  Stage IA, pT1cN0M0 grade 2, ER/PR positive invasive ductal carcinoma of the left breast.   Intent: Curative  Radiation Treatment Dates: 12/02/2020 through 01/01/2021 Site Technique Total Dose (Gy) Dose per Fx (Gy) Completed Fx Beam Energies  Breast, Left: Breast_Lt 3D 42.56/42.56 2.66 16/16 6X  Breast, Left: Breast_Lt_Bst 3D 10/10 2.5 4/4 6X   Narrative: The patient tolerated radiation therapy relatively well. She developed anticipated skin changes in the treatment field.  Plan: The patient will receive a call in about one month from the radiation oncology department. She will continue follow up with Dr. Marin Olp as well.   ________________________________________________    Carola Rhine, [facility]

## 2021-01-04 LAB — PROTEIN ELECTROPHORESIS, SERUM, WITH REFLEX
A/G Ratio: 1 (ref 0.7–1.7)
Albumin ELP: 3.8 g/dL (ref 2.9–4.4)
Alpha-1-Globulin: 0.2 g/dL (ref 0.0–0.4)
Alpha-2-Globulin: 0.9 g/dL (ref 0.4–1.0)
Beta Globulin: 1.2 g/dL (ref 0.7–1.3)
Gamma Globulin: 1.4 g/dL (ref 0.4–1.8)
Globulin, Total: 3.7 g/dL (ref 2.2–3.9)
M-Spike, %: 0.9 g/dL — ABNORMAL HIGH
SPEP Interpretation: 0
Total Protein ELP: 7.5 g/dL (ref 6.0–8.5)

## 2021-01-04 LAB — IMMUNOFIXATION REFLEX, SERUM
IgA: 63 mg/dL — ABNORMAL LOW (ref 87–352)
IgG (Immunoglobin G), Serum: 1479 mg/dL (ref 586–1602)
IgM (Immunoglobulin M), Srm: 44 mg/dL (ref 26–217)

## 2021-01-13 DIAGNOSIS — E1142 Type 2 diabetes mellitus with diabetic polyneuropathy: Secondary | ICD-10-CM | POA: Diagnosis not present

## 2021-01-13 DIAGNOSIS — Z79891 Long term (current) use of opiate analgesic: Secondary | ICD-10-CM | POA: Diagnosis not present

## 2021-01-13 DIAGNOSIS — M961 Postlaminectomy syndrome, not elsewhere classified: Secondary | ICD-10-CM | POA: Diagnosis not present

## 2021-01-13 DIAGNOSIS — G894 Chronic pain syndrome: Secondary | ICD-10-CM | POA: Diagnosis not present

## 2021-01-18 ENCOUNTER — Other Ambulatory Visit: Payer: Self-pay | Admitting: Hematology & Oncology

## 2021-01-18 ENCOUNTER — Other Ambulatory Visit: Payer: Self-pay | Admitting: Neurology

## 2021-01-18 ENCOUNTER — Ambulatory Visit: Payer: BC Managed Care – PPO

## 2021-01-18 ENCOUNTER — Other Ambulatory Visit: Payer: Self-pay | Admitting: *Deleted

## 2021-01-18 MED ORDER — LETROZOLE 2.5 MG PO TABS: 2.5000 mg | ORAL_TABLET | Freq: Every day | ORAL | 4 refills | Status: DC

## 2021-01-18 MED ORDER — ERGOCALCIFEROL 1.25 MG (50000 UT) PO CAPS: 50000.0000 [IU] | ORAL_CAPSULE | ORAL | 3 refills | Status: DC

## 2021-01-20 ENCOUNTER — Encounter: Payer: Self-pay | Admitting: *Deleted

## 2021-01-20 ENCOUNTER — Ambulatory Visit (HOSPITAL_BASED_OUTPATIENT_CLINIC_OR_DEPARTMENT_OTHER): Payer: BC Managed Care – PPO | Admitting: Obstetrics & Gynecology

## 2021-01-20 NOTE — Progress Notes (Signed)
Patient picked up her Femara yesterday. She would like to know what time of day is best to take. Instructed patient to take her medication in the morning. In the unlikely event that it causes nausea, told her that she could transition to before bed.   She also asks about her appointments in June. She received notice that her lab appointment was cancelled. I looked up appointments, and he lab appointment was cancelled, but I believe this to be an error as it was cancelled by someone who doesn't work in this office. Appointment remade for same date and time.  Oncology Nurse Navigator Documentation  Oncology Nurse Navigator Flowsheets 01/20/2021  Abnormal Finding Date -  Confirmed Diagnosis Date -  Diagnosis Status -  Planned Course of Treatment -  Phase of Treatment AI  Aromatase Inhibitor Actual Start Date: 01/20/2021  Radiation Actual Start Date: -  Radiation Expected End Date: -  Radiation Actual End Date: 01/01/2021  Expected Surgery Date -  Surgery Actual Start Date: -  Navigator Follow Up Date: 02/17/2021  Navigator Follow Up Reason: Follow-up Appointment  Navigator Location [facility]  Referral Date to RadOnc/MedOnc -  Navigator Encounter Type Telephone  Telephone Incoming Call;Medication Assistance;Appt Confirmation/Clarification  Treatment Initiated Date -  Patient Visit Type MedOnc  Treatment Phase Active Tx  Barriers/Navigation Needs Coordination of Care;Education  Education Other  Interventions Education;Medication Assistance;Psycho-Social Support  Acuity Level 2-Minimal Needs (1-2 Barriers Identified)  Referrals -  Coordination of Care Appts  Education Method Verbal  Support Groups/Services Friends and Family  Time Spent with Patient 60

## 2021-01-25 ENCOUNTER — Ambulatory Visit: Payer: BC Managed Care – PPO | Attending: General Surgery | Admitting: Physical Therapy

## 2021-01-25 ENCOUNTER — Ambulatory Visit
Admission: RE | Admit: 2021-01-25 | Discharge: 2021-01-25 | Disposition: A | Discharge status: Home / Self Care | Payer: BC Managed Care – PPO | Source: Ambulatory Visit | Attending: Hematology & Oncology | Admitting: Hematology & Oncology

## 2021-01-25 ENCOUNTER — Other Ambulatory Visit: Payer: Self-pay

## 2021-01-25 DIAGNOSIS — Z170 Estrogen receptor positive status [ER+]: Secondary | ICD-10-CM | POA: Insufficient documentation

## 2021-01-25 DIAGNOSIS — Z483 Aftercare following surgery for neoplasm: Secondary | ICD-10-CM | POA: Insufficient documentation

## 2021-01-25 DIAGNOSIS — R293 Abnormal posture: Secondary | ICD-10-CM | POA: Insufficient documentation

## 2021-01-25 DIAGNOSIS — C9000 Multiple myeloma not having achieved remission: Secondary | ICD-10-CM

## 2021-01-25 DIAGNOSIS — D472 Monoclonal gammopathy: Secondary | ICD-10-CM

## 2021-01-25 DIAGNOSIS — C50411 Malignant neoplasm of upper-outer quadrant of right female breast: Secondary | ICD-10-CM | POA: Insufficient documentation

## 2021-01-25 DIAGNOSIS — I890 Lymphedema, not elsewhere classified: Secondary | ICD-10-CM | POA: Insufficient documentation

## 2021-01-25 NOTE — Progress Notes (Signed)
  Radiation Oncology         [phone] ________________________________  Name: [name] MRN: [id number]  Date of Service: 01/25/2021  DOB: 17-Nov-1950  Post Treatment Telephone Note  Diagnosis:  Stage IA,pT1cN0M0 grade 2, ER/PR positive invasive ductal carcinoma of the left breast.   Interval Since Last Radiation:  4 weeks   12/02/2020 through 01/01/2021 Site Technique Total Dose (Gy) Dose per Fx (Gy) Completed Fx Beam Energies  Breast, Left: Breast_Lt 3D 42.56/42.56 2.66 16/16 6X  Breast, Left: Breast_Lt_Bst 3D 10/10 2.5 4/4 6X    Narrative:  The patient was contacted today for routine follow-up. During treatment she did very well with radiotherapy and did not have significant desquamation. She reports she is doing well and her skin is improved.  Impression/Plan: 1. Stage IA,pT1cN0M0 grade 2, ER/PR positive invasive ductal carcinoma of the left breast. The patient has been doing well since completion of radiotherapy. We discussed that we would be happy to continue to follow her as needed, but she will also continue to follow up with Dr. Marin Olp in medical oncology. She was counseled on skin care as well as measures to avoid sun exposure to this area.       Carola Rhine, PAC

## 2021-01-25 NOTE — Therapy (Signed)
[place], [place], [address] Phone: [phone]   Fax:  [phone]  Physical Therapy Treatment  Patient Details  Name: [name] MRN: [id number] Date of Birth: 02/01/1951 Referring Provider (PT): Dr. Marlou Starks   Encounter Date: 01/25/2021   PT End of Session - 01/25/21 1123    Visit Number 2    Number of Visits 2    PT Start Time 1030    PT Stop Time 1110    PT Time Calculation (min) 40 min    Activity Tolerance Patient tolerated treatment well    Behavior During Therapy [facility] for tasks assessed/performed           Past Medical History:  Diagnosis Date  . Allergies   . Anxiety   . Asthma    treated by pulmonologist   . Atherosclerosis of aorta ([place])    noted on xray  . Breast cancer ([place])    Right  . Chronic pain   . Depression   . Diabetes ([place])    type II  . Edema   . Goals of care, counseling/discussion 09/02/2020  . High cholesterol   . History of nuclear stress test 11/12/2009   normal   . Hypertension   . IgG monoclonal gammopathy of uncertain significance    igG kappa monoclonal gammopathy of unkown significance (per notes from [employer]).  . Insomnia   . Low back pain   . Migraine headache   . Neuropathy associated with MGUS ([place])   . Peripheral neuropathy   . PONV (postoperative nausea and vomiting)    hard to wake up  . Sleep apnea   . Smoldering multiple myeloma ([place]) 12/04/2017  . Vertigo     Past Surgical History:  Procedure Laterality Date  . BACK SURGERY  2019   T10-S1 Fusion Dr. Patrice Paradise  . BACK SURGERY  2019   2 fusion in lower back (L4-S1) and neck  . breast cyst removal age 65    . BREAST LUMPECTOMY WITH RADIOACTIVE SEED AND SENTINEL LYMPH NODE BIOPSY Left 09/30/2020   Procedure: RADIOACTIVE SEED GUIDED LEFT BREAST LUMPECTOMY, LEFT AXILLARY SENTINEL LYMPH NODE BIOPSY;  Surgeon: Jovita Kussmaul, MD;  Location: [facility];  Service:  General;  Laterality: Left;  . CESAREAN SECTION     x2  . hernia repair at 38 months old    . miscarriage D&C    . NASAL SINUS SURGERY     x2  . NECK SURGERY    . pain stimulator in back    . RE-EXCISION OF BREAST CANCER,SUPERIOR MARGINS Left 10/19/2020   Procedure: RE-EXCISION LEFT BREAST MARGINS;  Surgeon: Jovita Kussmaul, MD;  Location: [facility];  Service: General;  Laterality: Left;  . TONSILLECTOMY      There were no vitals filed for this visit.   Subjective Assessment - 01/25/21 1044    Subjective Pt here for a SOZO screen but has noticed left wrist bracelets are tighter today.                 LYMPHEDEMA/ONCOLOGY QUESTIONNAIRE - 01/25/21 0001      Type   Cancer Type Left breast cancer      Surgeries   Lumpectomy Date 09/30/20    Other Surgery Date 10/19/20    Number Lymph Nodes Removed 3      Treatment   Active Chemotherapy Treatment No    Past Chemotherapy Treatment No  Active Radiation Treatment No    Past Radiation Treatment Yes    Date 01/01/21    Body Site left breast    Current Hormone Treatment Yes    Date 01/18/21    Drug Name Letrozole      What other symptoms do you have   Are you Having Heaviness or Tightness No    Are you having Pain No    Are you having pitting edema No    Is it Hard or Difficult finding clothes that fit No    Do you have infections No    Is there Decreased scar mobility No    [place] Sign No      Right Upper Extremity Lymphedema   10 cm Proximal to Olecranon Process 34.2 cm    Olecranon Process 30.2 cm    10 cm Proximal to Ulnar Styloid Process 22.8 cm    Just Proximal to Ulnar Styloid Process 17.4 cm    Across Hand at [employer] 18.2 cm    At [place] of 2nd Digit 6.2 cm      Left Upper Extremity Lymphedema   10 cm Proximal to Olecranon Process 33.2 cm    Olecranon Process 30.8 cm    10 cm Proximal to Ulnar Styloid Process 23.9 cm    Just Proximal to Ulnar Styloid Process 17.8 cm    Across  Hand at [employer] 18.1 cm    At [place] of 2nd Digit 6.3 cm           L-DEX FLOWSHEETS - 01/25/21 1000      L-DEX LYMPHEDEMA SCREENING   Measurement Type Unilateral    L-DEX MEASUREMENT EXTREMITY Upper Extremity    POSITION  Standing    DOMINANT SIDE Right    At Risk Side Left    BASELINE SCORE (UNILATERAL) -2.6    L-DEX SCORE (UNILATERAL) 10    VALUE CHANGE (UNILAT) 12.6                                         Plan - 01/25/21 1123    Clinical Impression Statement Pt will increase on L-Dex score by 12.6 points from baseline and also has visible edema present in her left arm. Measured BUE wiht tape measure and due to weight loss, right has has decreased by left has remained the same. Sent in-basket message to Dr. Antonieta Pert nurse requesting referral to schedule eval and begin manual lymph drainage for swelling.    PT Next Visit Plan Eval if MD refers; otherwise see pt back in 4 weeks to reassess    Consulted and Agree with Plan of Care Patient           Patient will benefit from skilled therapeutic intervention in order to improve the following deficits and impairments:     Visit Diagnosis: Aftercare following surgery for neoplasm     Problem List Patient Active Problem List   Diagnosis Date Noted  . Goals of care, counseling/discussion 09/02/2020  . Malignant neoplasm of upper-outer quadrant of left breast in female, estrogen receptor positive ([place]) 08/27/2020  . Iron deficiency anemia 10/09/2019  . Impaired mobility and activities of daily living 08/30/2018  . Multiple myeloma ([place]) 08/30/2018  . S/P spinal fusion 08/30/2018  . Lumbar degenerative disc disease 08/27/2018  . History of degenerative disc disease 08/15/2018  . Neck pain 08/15/2018  . Sensorineural hearing  loss (SNHL), bilateral 08/15/2018  . Tinnitus of both ears 08/15/2018  . Smoldering multiple myeloma ([place]) 12/04/2017  . Breakdown (mechanical) of implanted  electronic neurostimulator, generator, initial encounter ([place]) 08/12/2016  . Myalgia 08/12/2016  . Post laminectomy syndrome 08/12/2016  . Migraine with aura and without status migrainosus, not intractable 10/15/2015  . Intrinsic asthma 01/11/2010  . Dyspnea 12/14/2009  . DIABETES MELLITUS, TYPE II 12/11/2009  . Hyperlipidemia 12/11/2009  . MIGRAINE HEADACHE 12/11/2009  . Hereditary and idiopathic peripheral neuropathy 12/11/2009  . Hypertension 12/11/2009  . Insomnia 12/11/2009  . EDEMA 12/11/2009  . Type 2 diabetes mellitus with diabetic polyneuropathy ([place]) 12/11/2009   Annia Friendly, PT 01/25/21 11:27 AM  [place], [place], [address] Phone: [phone]   Fax:  [phone]  Name: [name] MRN: [id number] Date of Birth: [birth year]

## 2021-01-26 ENCOUNTER — Encounter: Payer: Self-pay | Admitting: *Deleted

## 2021-01-26 DIAGNOSIS — Z170 Estrogen receptor positive status [ER+]: Secondary | ICD-10-CM

## 2021-01-26 DIAGNOSIS — C50419 Malignant neoplasm of upper-outer quadrant of unspecified female breast: Secondary | ICD-10-CM

## 2021-01-26 DIAGNOSIS — C50412 Malignant neoplasm of upper-outer quadrant of left female breast: Secondary | ICD-10-CM

## 2021-01-26 NOTE — Progress Notes (Signed)
Request received from Physical Therapy to place referral order for lymphedema therapy. Order placed.  Oncology Nurse Navigator Documentation  Oncology Nurse Navigator Flowsheets 01/26/2021  Abnormal Finding Date -  Confirmed Diagnosis Date -  Diagnosis Status -  Planned Course of Treatment -  Phase of Treatment -  Aromatase Inhibitor Actual Start Date: -  Radiation Actual Start Date: -  Radiation Expected End Date: -  Radiation Actual End Date: -  Expected Surgery Date -  Surgery Actual Start Date: -  Navigator Follow Up Date: 02/17/2021  Navigator Follow Up Reason: Follow-up Appointment  Navigator Location [facility]  Referral Date to RadOnc/MedOnc -  Navigator Encounter Type Other:  Telephone -  Treatment Initiated Date -  Patient Visit Type MedOnc  Treatment Phase Active Tx  Barriers/Navigation Needs Coordination of Care;Education  Education -  Interventions Referrals;Coordination of Care  Acuity Level 2-Minimal Needs (1-2 Barriers Identified)  Referrals Lymphdema;Physical Therapy  Coordination of Care PT  Education Method -  Support Groups/Services -  Time Spent with Patient 15

## 2021-02-02 ENCOUNTER — Ambulatory Visit: Payer: BC Managed Care – PPO | Admitting: Orthopaedic Surgery

## 2021-02-04 DIAGNOSIS — M47892 Other spondylosis, cervical region: Secondary | ICD-10-CM | POA: Diagnosis not present

## 2021-02-04 DIAGNOSIS — M4327 Fusion of spine, lumbosacral region: Secondary | ICD-10-CM | POA: Diagnosis not present

## 2021-02-04 DIAGNOSIS — M542 Cervicalgia: Secondary | ICD-10-CM | POA: Diagnosis not present

## 2021-02-09 ENCOUNTER — Ambulatory Visit: Payer: BC Managed Care – PPO

## 2021-02-09 ENCOUNTER — Other Ambulatory Visit: Payer: Self-pay

## 2021-02-09 DIAGNOSIS — Z170 Estrogen receptor positive status [ER+]: Secondary | ICD-10-CM | POA: Diagnosis not present

## 2021-02-09 DIAGNOSIS — C50411 Malignant neoplasm of upper-outer quadrant of right female breast: Secondary | ICD-10-CM | POA: Diagnosis not present

## 2021-02-09 DIAGNOSIS — Z483 Aftercare following surgery for neoplasm: Secondary | ICD-10-CM

## 2021-02-09 DIAGNOSIS — R293 Abnormal posture: Secondary | ICD-10-CM

## 2021-02-09 DIAGNOSIS — I890 Lymphedema, not elsewhere classified: Secondary | ICD-10-CM | POA: Diagnosis not present

## 2021-02-09 NOTE — Therapy (Addendum)
[place], [place], [address] Phone: [phone]   Fax:  [phone]  Physical Therapy Evaluation  Patient Details  Name: [name] MRN: [id number] Date of Birth: [birth year] Referring Provider (PT): Dr. Marin Olp   Encounter Date: 02/09/2021   PT End of Session - 02/09/21 2034     Visit Number 1    Number of Visits 12    Date for PT Re-Evaluation 03/23/21    PT Start Time 1302    PT Stop Time 1352    PT Time Calculation (min) 50 min    Activity Tolerance Patient tolerated treatment well    Behavior During Therapy [facility] for tasks assessed/performed             Past Medical History:  Diagnosis Date   Allergies    Anxiety    Asthma    treated by pulmonologist    Atherosclerosis of aorta ([place])    noted on xray   Breast cancer ([place])    Right   Chronic pain    Depression    Diabetes ([place])    type II   Edema    Goals of care, counseling/discussion 09/02/2020   High cholesterol    History of nuclear stress test 11/12/2009   normal    Hypertension    IgG monoclonal gammopathy of uncertain significance    igG kappa monoclonal gammopathy of unkown significance (per notes from [employer]).   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 Hulda Marin SURGERY  2019   2 fusion in lower back (L4-S1) and neck   breast cyst removal age 69     BREAST LUMPECTOMY WITH RADIOACTIVE SEED AND SENTINEL LYMPH NODE BIOPSY Left 09/30/2020   Procedure: RADIOACTIVE SEED GUIDED LEFT BREAST LUMPECTOMY, LEFT AXILLARY SENTINEL LYMPH NODE BIOPSY;  Surgeon: Jovita Kussmaul, MD;  Location: [facility];  Service: General;  Laterality: Left;   CESAREAN SECTION     x2   hernia repair at 14 months old     miscarriage D&C     NASAL SINUS SURGERY     x2   NECK SURGERY     pain stimulator in back     RE-EXCISION OF BREAST CANCER,SUPERIOR MARGINS Left 10/19/2020   Procedure: RE-EXCISION LEFT BREAST MARGINS;  Surgeon: Jovita Kussmaul, MD;  Location: [facility];  Service: General;  Laterality: Left;   TONSILLECTOMY      There were no vitals filed for this visit.    Subjective Assessment - 02/09/21 1402     Subjective Pt had SOZO screen on 01/25/21 and pt had visible swelling.  She was given a compression sleeve and gauntlet and wore for a day, and then the next day she couldn't wear it because it was too uncomfortable.  She tried it again, but couldn't wear it because it was too uncomfortable. It made red marks/white marks and ridges and pt brought pictures to show me    Pertinent History Recent diagnosis of Lt breast cancer IDC stage IA with surgery 09/30/20  with 3 lymph nodes removed and then re-excision 10/19/20.  pt is followed by Dr. Marin Olp due to smoldering igG Kappa myeloma.  Other history includes: DM, Peripheral neuropathy, and HTN, cervical fusion with limited movement and back T10-S1, vertigo, recent fall with hand fracture    Patient Stated Goals decrease swelling/have comfortable sleeve    Currently in Pain? No/denies    Pain Score 0-No pain                [facility] PT Assessment - 02/09/21 0001       Assessment   Medical Diagnosis Left UE lymphedema    Referring Provider (PT) Dr. Marin Olp    Onset Date/Surgical Date 08/12/20    Hand Dominance Right      Restrictions   Weight Bearing Restrictions No      Balance Screen   Has the patient fallen in the past 6 months Yes    How many times? 2    Has the patient had a decrease in activity level because of a fear of falling?  No    Is the patient reluctant to leave their home because of a fear of  falling?  No      Home Environment   Living Environment Private residence    Living Arrangements Children;Spouse/significant other    Available Help at Discharge Family    Type of [place] to enter    Home Layout One level      Prior Function   Level of Independence Independent    Vocation Retired      Associate Professor   Overall Cognitive Status Within Functional Limits for tasks assessed      Posture/Postural Control   Posture/Postural Control Postural limitations    Postural Limitations Rounded Shoulders               LYMPHEDEMA/ONCOLOGY QUESTIONNAIRE - 02/09/21 0001       Type   Cancer Type left breast CA/lymphedema      Surgeries   Lumpectomy Date 09/30/20    Other Surgery Date 10/19/20    Number Lymph Nodes Removed 3      Treatment   Active Chemotherapy Treatment No    Past Chemotherapy Treatment No    Active Radiation Treatment No    Past Radiation Treatment Yes    Date 01/01/21    Body Site left breast    Current Hormone Treatment Yes    Date 01/18/21    Drug Name Letrozole    Past Hormone Therapy No      What other symptoms do you have   Are you Having Heaviness or Tightness No    Are you having Pain No    Are you having pitting edema No    Is it Hard or Difficult finding clothes that fit No    Do you have infections No    Is there Decreased scar mobility No    [place]mer Sign No      Right Upper Extremity Lymphedema   15 cm Proximal to Olecranon Process 33.6 cm    10 cm Proximal to Olecranon Process 33.5 cm    Olecranon Process 31.1 cm    15 cm Proximal to Ulnar Styloid Process 27.4 cm    10 cm Proximal to Ulnar Styloid Process 22.9 cm    Just Proximal to Ulnar Styloid Process 17.6 cm    Across Hand at [employer] 18.2 cm    At [place] of 2nd  Digit 6.3 cm      Left Upper Extremity Lymphedema   15 cm Proximal to Olecranon Process 32.9 cm    10 cm Proximal to Olecranon Process 32.8 cm    Olecranon Process 30.4 cm    15 cm  Proximal to Ulnar Styloid Process 26 cm    10 cm Proximal to Ulnar Styloid Process 23.6 cm    Just Proximal to Ulnar Styloid Process 18 cm    Across Hand at [employer] 19 cm    At [place] of 2nd Digit 6.3 cm                     Objective measurements completed on examination: See above findings.   Goals:  Pt /family member will be Independent in self MLD Pt will be fit with flat knit compression sleeve for improved comfort Pt will be independent with donning compression garments with device prn.  Pt will have improved swelling in Left UE                         Plan - 02/09/21 2035     Clinical Impression Statement Pt does not notice heaviness or tightness in the left UE.  She tried wearing the sleeve and gauntlet but brought pictures where she had multiple ridges and red marks.  Bilateral UE's were remeasured today  on that left that.  She had slight increased circumference at the left wrist and slight increased swelling was at 10 cm proximal to ulna styloid otherwise most measurements on the right were larger. I rechecked the  size of her sleeve with my measurements and a size IV was correct.  We tried it on again and I showed her how to use rubber gloves to get all the wrinkles out and to get it high enough on her arm.  I cut a piece of 1/4 in foam to place under the medial upper band. the sleeve and the gauntlet were checked and fit properly.  She will try with the foam for another day or so but may try to get used to it several hours at a time if needed.  She was advised to call if it becomes uncomfortable.  If necessary we can try wrapping to see if it is more comfortable for her.  She will benefit from compression sleve or bandaging and education in self MLD to the left UE in an effort to reduce/prevent LE   Personal Factors and Comorbidities Age;Fitness;Comorbidity 2    Comorbidities neuropathy, myeloma,    Examination-Activity Limitations  Carry;Dressing;Reach Overhead    Examination-Participation Restrictions Cleaning;Yard Work    Stability/Clinical Decision Making Stable/Uncomplicated    Designer, jewellery Low    Rehab Potential Excellent    PT Frequency 2x / week    PT Duration 6 weeks    PT Treatment/Interventions ADLs/Self Care Home Management;Therapeutic exercise;Patient/family education;Manual techniques;Manual lymph drainage;Compression bandaging    PT Next Visit Plan assess comfort of sleeve with modifications made today, if uncomfortable consider teaching daughter and and 1 arm wrap to see if it is more comfortable.  MLD and pt or daughter education in MLD , review precautions   Recommended Other Services compression             Patient will benefit from skilled therapeutic intervention in order to improve the following deficits and impairments:  Increased edema,Postural dysfunction,Decreased knowledge of precautions  Visit Diagnosis: Aftercare following surgery for neoplasm  Acquired  lymphedema  Malignant neoplasm of upper-outer quadrant of right breast in female, estrogen receptor positive ([place])  Abnormal posture     Problem List Patient Active Problem List   Diagnosis Date Noted   Goals of care, counseling/discussion 09/02/2020   Malignant neoplasm of upper-outer quadrant of left breast in female, estrogen receptor positive ([place]) 08/27/2020   Iron deficiency anemia 10/09/2019   Impaired mobility and activities of daily living 08/30/2018   Multiple myeloma ([place]) 08/30/2018   S/P spinal fusion 08/30/2018   Lumbar degenerative disc disease 08/27/2018   History of degenerative disc disease 08/15/2018   Neck pain 08/15/2018   Sensorineural hearing loss (SNHL), bilateral 08/15/2018   Tinnitus of both ears 08/15/2018   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   Breakdown (mechanical) of implanted electronic neurostimulator, generator, initial encounter ([place]) 08/12/2016   Myalgia 08/12/2016    Post laminectomy syndrome 08/12/2016   Migraine with aura and without status migrainosus, not intractable 10/15/2015   Intrinsic asthma 01/11/2010   Dyspnea 12/14/2009   DIABETES MELLITUS, TYPE II 12/11/2009   Hyperlipidemia 12/11/2009   MIGRAINE HEADACHE 12/11/2009   Hereditary and idiopathic peripheral neuropathy 12/11/2009   Hypertension 12/11/2009   Insomnia 12/11/2009   EDEMA 12/11/2009   Type 2 diabetes mellitus with diabetic polyneuropathy ([place]) 12/11/2009    Claris Pong 02/09/2021, 8:53 PM  [place], [place], [address] Phone: [phone]   Fax:  [phone]  Name: [name] MRN: [id number] Date of Birth: 1951/09/12 Cheral Almas, PT 02/09/21 8:58 PM

## 2021-02-16 ENCOUNTER — Ambulatory Visit (HOSPITAL_BASED_OUTPATIENT_CLINIC_OR_DEPARTMENT_OTHER): Payer: BC Managed Care – PPO | Admitting: Obstetrics & Gynecology

## 2021-02-17 ENCOUNTER — Ambulatory Visit: Payer: BC Managed Care – PPO | Attending: General Surgery

## 2021-02-17 ENCOUNTER — Inpatient Hospital Stay: Payer: BC Managed Care – PPO | Attending: Hematology & Oncology | Admitting: Hematology & Oncology

## 2021-02-17 ENCOUNTER — Inpatient Hospital Stay: Payer: BC Managed Care – PPO

## 2021-02-17 ENCOUNTER — Encounter: Payer: Self-pay | Admitting: *Deleted

## 2021-02-17 ENCOUNTER — Other Ambulatory Visit: Payer: BC Managed Care – PPO

## 2021-02-17 ENCOUNTER — Telehealth: Payer: Self-pay

## 2021-02-17 ENCOUNTER — Other Ambulatory Visit: Payer: Self-pay

## 2021-02-17 VITALS — BP 134/66 | HR 62 | Temp 98.9°F | Resp 17 | Ht 66.0 in | Wt 199.1 lb

## 2021-02-17 DIAGNOSIS — M7989 Other specified soft tissue disorders: Secondary | ICD-10-CM | POA: Insufficient documentation

## 2021-02-17 DIAGNOSIS — R293 Abnormal posture: Secondary | ICD-10-CM | POA: Diagnosis not present

## 2021-02-17 DIAGNOSIS — D510 Vitamin B12 deficiency anemia due to intrinsic factor deficiency: Secondary | ICD-10-CM | POA: Diagnosis not present

## 2021-02-17 DIAGNOSIS — Z170 Estrogen receptor positive status [ER+]: Secondary | ICD-10-CM | POA: Insufficient documentation

## 2021-02-17 DIAGNOSIS — C9000 Multiple myeloma not having achieved remission: Secondary | ICD-10-CM

## 2021-02-17 DIAGNOSIS — I890 Lymphedema, not elsewhere classified: Secondary | ICD-10-CM | POA: Diagnosis not present

## 2021-02-17 DIAGNOSIS — C50412 Malignant neoplasm of upper-outer quadrant of left female breast: Secondary | ICD-10-CM

## 2021-02-17 DIAGNOSIS — Z483 Aftercare following surgery for neoplasm: Secondary | ICD-10-CM | POA: Insufficient documentation

## 2021-02-17 DIAGNOSIS — G629 Polyneuropathy, unspecified: Secondary | ICD-10-CM | POA: Diagnosis not present

## 2021-02-17 DIAGNOSIS — R12 Heartburn: Secondary | ICD-10-CM | POA: Diagnosis not present

## 2021-02-17 DIAGNOSIS — R5383 Other fatigue: Secondary | ICD-10-CM | POA: Diagnosis not present

## 2021-02-17 DIAGNOSIS — C50912 Malignant neoplasm of unspecified site of left female breast: Secondary | ICD-10-CM | POA: Insufficient documentation

## 2021-02-17 DIAGNOSIS — D472 Monoclonal gammopathy: Secondary | ICD-10-CM | POA: Diagnosis not present

## 2021-02-17 DIAGNOSIS — R002 Palpitations: Secondary | ICD-10-CM | POA: Insufficient documentation

## 2021-02-17 DIAGNOSIS — M2550 Pain in unspecified joint: Secondary | ICD-10-CM | POA: Diagnosis not present

## 2021-02-17 DIAGNOSIS — C50411 Malignant neoplasm of upper-outer quadrant of right female breast: Secondary | ICD-10-CM | POA: Diagnosis not present

## 2021-02-17 DIAGNOSIS — Z79899 Other long term (current) drug therapy: Secondary | ICD-10-CM | POA: Insufficient documentation

## 2021-02-17 DIAGNOSIS — Z923 Personal history of irradiation: Secondary | ICD-10-CM | POA: Diagnosis not present

## 2021-02-17 DIAGNOSIS — M791 Myalgia, unspecified site: Secondary | ICD-10-CM | POA: Insufficient documentation

## 2021-02-17 LAB — CMP (CANCER CENTER ONLY)
ALT: 14 U/L (ref 0–44)
AST: 18 U/L (ref 15–41)
Albumin: 4.6 g/dL (ref 3.5–5.0)
Alkaline Phosphatase: 54 U/L (ref 38–126)
Anion gap: 7 (ref 5–15)
BUN: 21 mg/dL (ref 8–23)
CO2: 32 mmol/L (ref 22–32)
Calcium: 10.6 mg/dL — ABNORMAL HIGH (ref 8.9–10.3)
Chloride: 100 mmol/L (ref 98–111)
Creatinine: 0.98 mg/dL (ref 0.44–1.00)
GFR, Estimated: >60 mL/min (ref >60)
Glucose, Bld: 106 mg/dL — ABNORMAL HIGH (ref 70–99)
Potassium: 3.7 mmol/L (ref 3.5–5.1)
Sodium: 139 mmol/L (ref 135–145)
Total Bilirubin: 0.6 mg/dL (ref 0.3–1.2)
Total Protein: 7.7 g/dL (ref 6.5–8.1)

## 2021-02-17 LAB — CBC WITH DIFFERENTIAL (CANCER CENTER ONLY)
Abs Immature Granulocytes: 0.03 10*3/uL (ref 0.00–0.07)
Basophils Absolute: 0.1 10*3/uL (ref 0.0–0.1)
Basophils Relative: 1 %
Eosinophils Absolute: 0.3 10*3/uL (ref 0.0–0.5)
Eosinophils Relative: 3 %
HCT: 41.7 % (ref 36.0–46.0)
Hemoglobin: 13.6 g/dL (ref 12.0–15.0)
Immature Granulocytes: 0 %
Lymphocytes Relative: 32 %
Lymphs Abs: 3.5 10*3/uL (ref 0.7–4.0)
MCH: 28.9 pg (ref 26.0–34.0)
MCHC: 32.6 g/dL (ref 30.0–36.0)
MCV: 88.5 fL (ref 80.0–100.0)
Monocytes Absolute: 0.9 10*3/uL (ref 0.1–1.0)
Monocytes Relative: 9 %
Neutro Abs: 6.1 10*3/uL (ref 1.7–7.7)
Neutrophils Relative %: 55 %
Platelet Count: 337 10*3/uL (ref 150–400)
RBC: 4.71 MIL/uL (ref 3.87–5.11)
RDW: 12.5 % (ref 11.5–15.5)
WBC Count: 11 10*3/uL — ABNORMAL HIGH (ref 4.0–10.5)
nRBC: 0 % (ref 0.0–0.2)

## 2021-02-17 LAB — VITAMIN D 25 HYDROXY (VIT D DEFICIENCY, FRACTURES): Vit D, 25-Hydroxy: 61.11 ng/mL (ref 30–100)

## 2021-02-17 NOTE — Telephone Encounter (Signed)
appts made per 02/17/21 los  Datron Brakebill

## 2021-02-17 NOTE — Progress Notes (Signed)
Hematology and Oncology Follow Up Visit  [name] [id number] 1951-04-05 70 y.o. 02/17/2021   Principle Diagnosis:  1. Stage IA (T1cN0M0) infiltrating ductal carcinoma of the LEFT breast -- ER+/PR+/HER2- -- Oncotype = 16 2. IgG Kappa smoldering myeloma 3. Chronic neuropathy 4. Pernicious Anemia  Current Therapy:   Velcade q week dosing (3/1) -- s/p cycle #2 -- d/c on 02/06/2018 for lack of effectiveness Vit B12 1000 mcg IM q month - start on 03/25/2020 S/p XRT to the LEFT breast -- completed on 01/01/2021 Femara 2.5 mg po q day -- start on 01/19/2021  Interim History:  [name] is here today for follow-up.  She has a compression wrapping on her left arm.  This is for lymphedema.  Hopefully, this will help with any swelling.  She tried a compression sleeve but this was too tight and just hurt her left arm.   She completed her radiation therapy.  She had really no problems with this.  I am surprised because her skin is so fair.  Otherwise, she is doing okay.  She started the Femara about a month ago and seems to be tolerating this pretty well.  She is not having hot flashes.  There is no arthralgias.  She fell over Memorial Day weekend.  Thankfully, she did not break anything.  She has bad neuropathy from back surgery.  This really causes problems with her balance.  She cannot think about using a cane to walk with.  Her monoclonal gammopathy is about the same.  Back in April, her M spike was 0.9 g/dL.  The IgG level was 1450 mg/dL.  She has had no problems with bowels or bladder.  She has had no nausea or vomiting.  She has had no bleeding.  There is no cough or shortness of breath.  Overall, I would say her performance status is ECOG 1.    Medications:  Allergies as of 02/17/2021      Reactions   Dristan [pheniramine-phenylephrine] Palpitations   Other Palpitations   Contact "old cold medicine      Medication List       Accurate as of February 17, 2021 12:39 PM. If  you have any questions, ask your nurse or doctor.        Accu-Chek Softclix Lancets lancets   albuterol 108 (90 Base) MCG/ACT inhaler Commonly known as: VENTOLIN HFA Inhale into the lungs every 6 (six) hours as needed for wheezing or shortness of breath.   amitriptyline 10 MG tablet Commonly known as: ELAVIL Take 10 mg by mouth at bedtime.   cyanocobalamin 1000 MCG/ML injection Commonly known as: (VITAMIN B-12) Inject 1000 mcg (1 mL) into the muscle every 30 days for 6 months.   cyclobenzaprine 5 MG tablet Commonly known as: FLEXERIL Take 5 mg by mouth 3 (three) times daily as needed.   ergocalciferol 1.25 MG (50000 UT) capsule Commonly known as: VITAMIN D2 Take 1 capsule (50,000 Units total) by mouth once a week.   FreeStyle [employer] [place] Reader Kerrin Mo See admin instructions.   HYDROcodone-acetaminophen 10-325 MG tablet Commonly known as: NORCO TAKE 1 TABLET BY MOUTH EVERY SIX HOURS AS NEEDED DOSE CHANGE   letrozole 2.5 MG tablet Commonly known as: FEMARA Take 1 tablet (2.5 mg total) by mouth daily.   metFORMIN 500 MG 24 hr tablet Commonly known as: GLUCOPHAGE-XR Take 1,000 mg by mouth 2 (two) times daily.   metoprolol succinate 100 MG 24 hr tablet  Commonly known as: TOPROL-XL Take 100 mg by mouth daily.   morphine 60 MG 12 hr tablet Commonly known as: MS CONTIN Take 60 mg by mouth every 12 (twelve) hours.   morphine 60 MG 24 hr capsule Commonly known as: AVINZA 1 capsule   pravastatin 40 MG tablet Commonly known as: PRAVACHOL Take 40 mg by mouth daily.   venlafaxine XR 150 MG 24 hr capsule Commonly known as: EFFEXOR-XR Take 150 mg by mouth daily with breakfast.       Allergies:  Allergies  Allergen Reactions  . Dristan [Pheniramine-Phenylephrine] Palpitations  . Other Palpitations    Contact "old cold medicine    Past Medical History, Surgical history, Social history, and Family History were reviewed and  updated.  Review of Systems: Review of Systems  Constitutional: Positive for malaise/fatigue.  HENT: Negative.   Eyes: Negative.   Respiratory: Positive for shortness of breath.   Cardiovascular: Positive for palpitations and leg swelling.  Gastrointestinal: Positive for constipation and heartburn.  Genitourinary: Negative.   Musculoskeletal: Positive for joint pain and myalgias.  Skin: Negative.   Neurological: Positive for tingling.  Endo/Heme/Allergies: Negative.   Psychiatric/Behavioral: Negative.      Physical Exam:  height is 5' 6"  (1.676 m) and weight is 90.3 kg. Her oral temperature is 98.9 F (37.2 C). Her blood pressure is 134/66 and her pulse is 62. Her respiration is 17 and oxygen saturation is 98%.   Wt Readings from Last 3 Encounters:  02/17/21 90.3 kg  12/30/20 91.2 kg  11/11/20 90 kg  Is 1 weight 1 1% patient is no  Physical Exam Vitals reviewed.  Constitutional:      Comments: On her breast exam, the right breast shows the biopsy site to be well-healed.  There is no distinct mass in the right breast.  There is no right axillary adenopathy.  Left breast shows the lumpectomy scar at the 1 o'clock position.  She has some slight erythema on the left breast from radiation.  There is no actual skin breakdown.  This is at the edge of the areola.  She has a well healing lymphadenectomy scar in the left axilla.  There is no axillary adenopathy in the left axilla.  HENT:     Head: Normocephalic and atraumatic.  Eyes:     Pupils: Pupils are equal, round, and reactive to light.  Cardiovascular:     Rate and Rhythm: Normal rate and regular rhythm.     Heart sounds: Normal heart sounds.  Pulmonary:     Effort: Pulmonary effort is normal.     Breath sounds: Normal breath sounds.  Abdominal:     General: Bowel sounds are normal.     Palpations: Abdomen is soft.  Musculoskeletal:        General: No tenderness or deformity. Normal range of motion.     Cervical back:  Normal range of motion.     Comments: On her left arm, she has a compression wrapping.  She has an ace wrap that is over gauze that is on her hand up to her wrist.  Lymphadenopathy:     Cervical: No cervical adenopathy.  Skin:    General: Skin is warm and dry.     Findings: No erythema or rash.  Neurological:     Mental Status: She is alert and oriented to person, place, and time.  Psychiatric:        Behavior: Behavior normal.        Thought Content: Thought  content normal.        Judgment: Judgment normal.     Lab Results  Component Value Date   WBC 11.0 (H) 02/17/2021   HGB 13.6 02/17/2021   HCT 41.7 02/17/2021   MCV 88.5 02/17/2021   PLT 337 02/17/2021   Lab Results  Component Value Date   FERRITIN 55 10/03/2019   IRON 68 10/03/2019   TIBC 389 10/03/2019   UIBC 321 10/03/2019   IRONPCTSAT 17 (L) 10/03/2019   Lab Results  Component Value Date   RBC 4.71 02/17/2021   Lab Results  Component Value Date   KPAFRELGTCHN 30.2 (H) 12/30/2020   LAMBDASER 23.7 12/30/2020   KAPLAMBRATIO 1.27 12/30/2020   Lab Results  Component Value Date   IGGSERUM 1,408 12/30/2020   IGGSERUM 1,479 12/30/2020   IGA 66 (L) 12/30/2020   IGA 63 (L) 12/30/2020   IGMSERUM 49 12/30/2020   IGMSERUM 44 12/30/2020   Lab Results  Component Value Date   TOTALPROTELP 7.5 12/30/2020   ALBUMINELP 3.8 12/30/2020   A1GS 0.2 12/30/2020   A2GS 0.9 12/30/2020   BETS 1.2 12/30/2020   BETA2SER 0.3 02/20/2015   GAMS 1.4 12/30/2020   MSPIKE 0.9 (H) 12/30/2020   SPEI * 02/20/2015     Chemistry      Component Value Date/Time   NA 139 02/17/2021 1143   NA 139 03/23/2020 1220   NA 140 03/27/2017 0804   K 3.7 02/17/2021 1143   K 3.7 03/27/2017 0804   CL 100 02/17/2021 1143   CL 94 (L) 02/20/2015 1010   CO2 32 02/17/2021 1143   CO2 28 03/27/2017 0804   BUN 21 02/17/2021 1143   BUN 18 03/23/2020 1220   BUN 14.9 03/27/2017 0804   CREATININE 0.98 02/17/2021 1143   CREATININE 0.9 03/27/2017 0804       Component Value Date/Time   CALCIUM 10.6 (H) 02/17/2021 1143   CALCIUM 10.0 03/27/2017 0804   ALKPHOS 54 02/17/2021 1143   ALKPHOS 69 03/27/2017 0804   AST 18 02/17/2021 1143   AST 26 03/27/2017 0804   ALT 14 02/17/2021 1143   ALT 28 03/27/2017 0804   BILITOT 0.6 02/17/2021 1143   BILITOT 0.76 03/27/2017 0804     Impression and Plan: Ms. Wardrop is 70 yo postmenopausal white female with smoldering myeloma and also chronic neuropathy due to diabetes and chronic back issues.   She now has a stage Ia ductal carcinoma of the left breast.  She underwent a lumpectomy.  She has a low Oncotype score.  I really doubt that she is going to benefit from any chemotherapy.  We will continue her on the Femara.  I think that this will help her.  I really think the risk of recurrence for her is going to be less than 10%.  I think her bigger issue is the neuropathy which is chronic.  We will plan to get her back now in about 3 months.  We will get her back after the summertime.  Hopefully she will have a little bit of the vacation.    [name], MD 6/8/202212:39 PM

## 2021-02-17 NOTE — Progress Notes (Signed)
Oncology Nurse Navigator Documentation  Oncology Nurse Navigator Flowsheets 02/17/2021  Abnormal Finding Date -  Confirmed Diagnosis Date -  Diagnosis Status -  Planned Course of Treatment -  Phase of Treatment -  Aromatase Inhibitor Actual Start Date: -  Radiation Actual Start Date: -  Radiation Expected End Date: -  Radiation Actual End Date: -  Expected Surgery Date -  Surgery Actual Start Date: -  Navigator Follow Up Date: 05/20/2021  Navigator Follow Up Reason: Follow-up Appointment  Navigator Location [facility]  Referral Date to RadOnc/MedOnc -  Navigator Encounter Type Appt/Treatment Plan Review  Telephone -  Treatment Initiated Date -  Patient Visit Type MedOnc  Treatment Phase Active Tx  Barriers/Navigation Needs Coordination of Care;Education  Education -  Interventions None Required  Acuity Level 2-Minimal Needs (1-2 Barriers Identified)  Referrals -  Coordination of Care -  Education Method -  Support Groups/Services Friends and Family  Time Spent with Patient 15

## 2021-02-17 NOTE — Therapy (Addendum)
[place], [place], [address] Phone: [phone]   Fax:  [phone]  Physical Therapy Treatment  Patient Details  Name: [name] MRN: [id number] Date of Birth: 10/07/1950 Referring Provider (PT): Dr. Marin Olp   Encounter Date: 02/17/2021   PT End of Session - 02/17/21 1007    Visit Number 2    Number of Visits 12    Date for PT Re-Evaluation 03/23/21    PT Start Time 0907    PT Stop Time 1004    PT Time Calculation (min) 57 min    Activity Tolerance Patient tolerated treatment well    Behavior During Therapy [facility] for tasks assessed/performed           Past Medical History:  Diagnosis Date  . Allergies   . Anxiety   . Asthma    treated by pulmonologist   . Atherosclerosis of aorta ([place])    noted on xray  . Breast cancer ([place])    Right  . Chronic pain   . Depression   . Diabetes ([place])    type II  . Edema   . Goals of care, counseling/discussion 09/02/2020  . High cholesterol   . History of nuclear stress test 11/12/2009   normal   . Hypertension   . IgG monoclonal gammopathy of uncertain significance    igG kappa monoclonal gammopathy of unkown significance (per notes from [employer]).  . Insomnia   . Low back pain   . Migraine headache   . Neuropathy associated with MGUS ([place])   . Peripheral neuropathy   . PONV (postoperative nausea and vomiting)    hard to wake up  . Sleep apnea   . Smoldering multiple myeloma ([place]) 12/04/2017  . Vertigo     Past Surgical History:  Procedure Laterality Date  . BACK SURGERY  2019   T10-S1 Fusion Dr. Patrice [place]  . BACK SURGERY  2019   2 fusion in lower back (L4-S1) and neck  . breast cyst removal age 70    . BREAST LUMPECTOMY WITH RADIOACTIVE SEED AND SENTINEL LYMPH NODE BIOPSY Left 09/30/2020   Procedure: RADIOACTIVE SEED GUIDED LEFT BREAST LUMPECTOMY, LEFT AXILLARY SENTINEL LYMPH NODE BIOPSY;  Surgeon: Jovita Kussmaul, MD;   Location: [place];  Service: General;  Laterality: Left;  . CESAREAN SECTION     x2  . hernia repair at 13 months old    . miscarriage D&C    . NASAL SINUS SURGERY     x2  . NECK SURGERY    . pain stimulator in back    . RE-EXCISION OF BREAST CANCER,SUPERIOR MARGINS Left 10/19/2020   Procedure: RE-EXCISION LEFT BREAST MARGINS;  Surgeon: Jovita Kussmaul, MD;  Location: [facility];  Service: General;  Laterality: Left;  . TONSILLECTOMY      There were no vitals filed for this visit.   Subjective Assessment - 02/17/21 0917    Subjective I've been trying to wear the compression sleeve with the foam and it is still bothering me and causing red marks on my arms. I brought my daughter today to learn the bandaging.    Pertinent History Recent diagnosis of Lt breast cancer IDC stage IA with surgery 09/30/20 with 3 lymph nodes removed and then re-excision 10/19/20.  pt is followed by Dr. Marin Olp due to smoldering igG Kappa myeloma.  Other history includes: DM, Peripheral neuropathy, and HTN, cervical fusion with limited movement and back T10-S1,  vertigo, recent fall with hand fracture    Patient Stated Goals decrease swelling/have comfortable sleeve    Currently in Pain? No/denies                             [facility] Adult PT Treatment/Exercise - 02/17/21 0001      Manual Therapy   Manual Therapy Manual Lymphatic Drainage (MLD);Compression Bandaging    Compression Bandaging Performed compression bandaging to Lt UE while instructing daughter in this as follows: TG soft from hand to axilla, Elastomull to fingers 1-4, 1-6 cm to hand and 1-12 cm short stretch compression bandages from wrist to axilla, hand daughter return demo of each                  PT Education - 02/17/21 1224    Education Details Instructed pt and daughter in compression bandaging    Person(s) Educated Patient    Methods Explanation;Demonstration;Handout    Comprehension  Verbalized understanding;Returned demonstration;Need further instruction                      Plan - 02/17/21 1007    Clinical Impression Statement Spent session instructing daughter and pt in compression bandaging. Performed this and then had daughter return each step. She will benefit from further review to assess tension of bandage.    Personal Factors and Comorbidities Age;Fitness;Comorbidity 2    Comorbidities neuropathy, myeloma,    Examination-Activity Limitations Carry;Dressing;Reach Overhead    Examination-Participation Restrictions Cleaning;Yard Work    Stability/Clinical Decision Making Stable/Uncomplicated    Rehab Potential Excellent    PT Frequency 2x / week    PT Duration 6 weeks    PT Treatment/Interventions ADLs/Self Care Home Management;Therapeutic exercise;Patient/family education;Manual techniques;Manual lymph drainage;Compression bandaging    PT Next Visit Plan Review copression bandaging with daughter; stat and instruct in MLD and daughter education in MLD    PT Home Exercise Plan compression bandaging    Consulted and Agree with Plan of Care Patient;Family member/caregiver    Family Member Consulted daughter Nira Conn           Patient will benefit from skilled therapeutic intervention in order to improve the following deficits and impairments:  Increased edema,Postural dysfunction,Decreased knowledge of precautions  Visit Diagnosis: Aftercare following surgery for neoplasm  Acquired lymphedema  Malignant neoplasm of upper-outer quadrant of right breast in female, estrogen receptor positive ([place])  Abnormal posture     Problem List Patient Active Problem List   Diagnosis Date Noted  . Goals of care, counseling/discussion 09/02/2020  . Malignant neoplasm of upper-outer quadrant of left breast in female, estrogen receptor positive ([place]) 08/27/2020  . Iron deficiency anemia 10/09/2019  . Impaired mobility and activities of daily living  08/30/2018  . Multiple myeloma ([place]) 08/30/2018  . S/P spinal fusion 08/30/2018  . Lumbar degenerative disc disease 08/27/2018  . History of degenerative disc disease 08/15/2018  . Neck pain 08/15/2018  . Sensorineural hearing loss (SNHL), bilateral 08/15/2018  . Tinnitus of both ears 08/15/2018  . Smoldering multiple myeloma ([place]) 12/04/2017  . Breakdown (mechanical) of implanted electronic neurostimulator, generator, initial encounter ([place]) 08/12/2016  . Myalgia 08/12/2016  . Post laminectomy syndrome 08/12/2016  . Migraine with aura and without status migrainosus, not intractable 10/15/2015  . Intrinsic asthma 01/11/2010  . Dyspnea 12/14/2009  . DIABETES MELLITUS, TYPE II 12/11/2009  . Hyperlipidemia 12/11/2009  . MIGRAINE HEADACHE 12/11/2009  . Hereditary and idiopathic peripheral neuropathy  12/11/2009  . Hypertension 12/11/2009  . Insomnia 12/11/2009  . EDEMA 12/11/2009  . Type 2 diabetes mellitus with diabetic polyneuropathy ([place]) 12/11/2009    Otelia Limes, PTA 02/17/2021, 12:25 PM  [place], [place], [address] Phone: [phone]   Fax:  [phone]  Name: JESLYNN HOLLANDER MRN: [id number] Date of Birth: 09-23-1950

## 2021-02-18 LAB — IGG, IGA, IGM
IgA: 64 mg/dL — ABNORMAL LOW (ref 87–352)
IgG (Immunoglobin G), Serum: 1476 mg/dL (ref 586–1602)
IgM (Immunoglobulin M), Srm: 44 mg/dL (ref 26–217)

## 2021-02-18 LAB — KAPPA/LAMBDA LIGHT CHAINS
Kappa free light chain: 33.9 mg/L — ABNORMAL HIGH (ref 3.3–19.4)
Kappa, lambda light chain ratio: 1.39 (ref 0.26–1.65)
Lambda free light chains: 24.4 mg/L (ref 5.7–26.3)

## 2021-02-19 LAB — PROTEIN ELECTROPHORESIS, SERUM, WITH REFLEX
A/G Ratio: 1 (ref 0.7–1.7)
Albumin ELP: 3.8 g/dL (ref 2.9–4.4)
Alpha-1-Globulin: 0.2 g/dL (ref 0.0–0.4)
Alpha-2-Globulin: 0.9 g/dL (ref 0.4–1.0)
Beta Globulin: 1.2 g/dL (ref 0.7–1.3)
Gamma Globulin: 1.4 g/dL (ref 0.4–1.8)
Globulin, Total: 3.8 g/dL (ref 2.2–3.9)
M-Spike, %: 1 g/dL — ABNORMAL HIGH
SPEP Interpretation: 0
Total Protein ELP: 7.6 g/dL (ref 6.0–8.5)

## 2021-02-19 LAB — IMMUNOFIXATION REFLEX, SERUM
IgA: 68 mg/dL — ABNORMAL LOW (ref 87–352)
IgG (Immunoglobin G), Serum: 1465 mg/dL (ref 586–1602)
IgM (Immunoglobulin M), Srm: 48 mg/dL (ref 26–217)

## 2021-02-22 ENCOUNTER — Encounter: Payer: Self-pay | Admitting: Physical Therapy

## 2021-02-22 ENCOUNTER — Ambulatory Visit: Payer: BC Managed Care – PPO | Admitting: Physical Therapy

## 2021-02-22 ENCOUNTER — Other Ambulatory Visit: Payer: Self-pay

## 2021-02-22 ENCOUNTER — Ambulatory Visit: Payer: Self-pay | Admitting: Physical Therapy

## 2021-02-22 DIAGNOSIS — I890 Lymphedema, not elsewhere classified: Secondary | ICD-10-CM | POA: Diagnosis not present

## 2021-02-22 DIAGNOSIS — Z483 Aftercare following surgery for neoplasm: Secondary | ICD-10-CM | POA: Diagnosis not present

## 2021-02-22 DIAGNOSIS — R293 Abnormal posture: Secondary | ICD-10-CM | POA: Diagnosis not present

## 2021-02-22 DIAGNOSIS — C50411 Malignant neoplasm of upper-outer quadrant of right female breast: Secondary | ICD-10-CM | POA: Diagnosis not present

## 2021-02-22 DIAGNOSIS — Z170 Estrogen receptor positive status [ER+]: Secondary | ICD-10-CM | POA: Diagnosis not present

## 2021-02-22 NOTE — Therapy (Signed)
[place], [place], [address] Phone: [phone]   Fax:  [phone]  Physical Therapy Treatment  Patient Details  Name: [name] MRN: [id number] Date of Birth: [birth year] Referring Provider (PT): Dr. Marin Olp   Encounter Date: 02/22/2021   PT End of Session - 02/22/21 0958     Visit Number 3    Number of Visits 12    Date for PT Re-Evaluation 03/23/21    PT Start Time 0913    PT Stop Time 0956    PT Time Calculation (min) 43 min    Activity Tolerance Patient tolerated treatment well    Behavior During Therapy [facility] for tasks assessed/performed             Past Medical History:  Diagnosis Date   Allergies    Anxiety    Asthma    treated by pulmonologist    Atherosclerosis of aorta ([place])    noted on xray   Breast cancer ([place])    Right   Chronic pain    Depression    Diabetes ([place])    type II   Edema    Goals of care, counseling/discussion 09/02/2020   High cholesterol    History of nuclear stress test 11/12/2009   normal    Hypertension    IgG monoclonal gammopathy of uncertain significance    igG kappa monoclonal gammopathy of unkown significance (per notes from [employer]).   Insomnia    Low back pain    Migraine headache    Neuropathy associated with MGUS ([facility])    Peripheral neuropathy    PONV (postoperative nausea and vomiting)    hard to wake up   Sleep apnea    Smoldering multiple myeloma ([place]) 12/04/2017   Vertigo     Past Surgical History:  Procedure Laterality Date   BACK SURGERY  2019   T10-S1 Fusion Dr. Hulda Marin SURGERY  2019   2 fusion in lower back (L4-S1) and neck   breast cyst removal age 63     BREAST LUMPECTOMY WITH RADIOACTIVE SEED AND SENTINEL LYMPH NODE BIOPSY Left 09/30/2020   Procedure: RADIOACTIVE SEED GUIDED LEFT BREAST LUMPECTOMY, LEFT AXILLARY SENTINEL LYMPH NODE BIOPSY;  Surgeon: Jovita Kussmaul, MD;  Location: [facility];  Service: General;  Laterality: Left;   CESAREAN SECTION     x2   hernia repair at 5 months old     miscarriage D&C     NASAL SINUS SURGERY     x2   NECK SURGERY     pain stimulator in back     RE-EXCISION OF BREAST CANCER,SUPERIOR MARGINS Left 10/19/2020   Procedure: RE-EXCISION LEFT BREAST MARGINS;  Surgeon: Jovita Kussmaul, MD;  Location: [facility];  Service: General;  Laterality: Left;   TONSILLECTOMY      There were no vitals filed for this visit.   Subjective Assessment - 02/22/21 0914     Subjective I had to take the bandages off last night because they were driving me crazy. They hurt at the front of the elbow.    Pertinent History Recent diagnosis of Lt breast cancer IDC stage IA with surgery 09/30/20 with 3 lymph nodes removed and then re-excision 10/19/20.  pt is followed by Dr. Marin Olp due to smoldering igG Kappa myeloma.  Other history includes: DM, Peripheral neuropathy, and HTN, cervical fusion with limited movement and back T10-S1, vertigo, recent fall with hand fracture  Patient Stated Goals decrease swelling/have comfortable sleeve    Currently in Pain? No/denies    Pain Score 0-No pain                   LYMPHEDEMA/ONCOLOGY QUESTIONNAIRE - 02/22/21 0001       Left Upper Extremity Lymphedema   15 cm Proximal to Olecranon Process 33 cm    10 cm Proximal to Olecranon Process 32.6 cm    Olecranon Process 30 cm    15 cm Proximal to Ulnar Styloid Process 26 cm    10 cm Proximal to Ulnar Styloid Process 22.5 cm    Just Proximal to Ulnar Styloid Process 17.6 cm    Across Hand at [employer] 18.9 cm    At [place] of 2nd Digit 6.2 cm             L-DEX FLOWSHEETS - 02/22/21 0900       L-DEX LYMPHEDEMA SCREENING   BASELINE SCORE (UNILATERAL) -2.6    L-DEX SCORE (UNILATERAL) 9.7    VALUE CHANGE (UNILAT) 12.3                       [facility] Adult PT Treatment/Exercise - 02/22/21 0001       Manual Therapy    Manual Therapy Edema management;Compression Bandaging    Edema Management measured UE circumferences    Compression Bandaging Performed compression bandaging to Lt UE while instructing daughter in this as follows: TG soft from hand to axilla, Elastomull to fingers 1-5, 1-6 cm to hand and 1-12 cm short stretch compression bandages from wrist to axilla - had to remove hand bandage and reapply as pt felt it was too tight                                Plan - 02/22/21 0958     Clinical Impression Statement Pt reports she removed her bandages last night due to discomfort. Her daughter reports that pt did not want her to rebandage so she has not attempted bandaging the pt yet. Re measured SOZO today and her number had only decreased 0.3. She reports she did not wear her sleeve everyday as it was too uncomfortable. Reapplied compression bandaging today while instructing pt and daughter. Encouraged pt to allow daughter to try to rebandage if bandages become loose. Overall pts circumferential measurements did decrease some since last session. Will continue bandaging and will begin MLD at next appt - unable to today due to time constraints since pt arrived late.    PT Frequency 2x / week    PT Duration 6 weeks    PT Treatment/Interventions ADLs/Self Care Home Management;Therapeutic exercise;Patient/family education;Manual techniques;Manual lymph drainage;Compression bandaging    PT Next Visit Plan Review copression bandaging with daughter; stat and instruct in MLD and daughter education in MLD    Consulted and Agree with Plan of Care Patient;Family member/caregiver             Patient will benefit from skilled therapeutic intervention in order to improve the following deficits and impairments:  Increased edema, Postural dysfunction, Decreased knowledge of precautions  Visit Diagnosis: Acquired lymphedema     Problem List Patient Active Problem List   Diagnosis Date Noted    Goals of care, counseling/discussion 09/02/2020   Malignant neoplasm of upper-outer quadrant of left breast in female, estrogen receptor positive ([place]) 08/27/2020   Iron deficiency anemia 10/09/2019  Impaired mobility and activities of daily living 08/30/2018   Multiple myeloma ([place]) 08/30/2018   S/P spinal fusion 08/30/2018   Lumbar degenerative disc disease 08/27/2018   History of degenerative disc disease 08/15/2018   Neck pain 08/15/2018   Sensorineural hearing loss (SNHL), bilateral 08/15/2018   Tinnitus of both ears 08/15/2018   Smoldering multiple myeloma ([place]) 12/04/2017   Breakdown (mechanical) of implanted electronic neurostimulator, generator, initial encounter ([place]) 08/12/2016   Myalgia 08/12/2016   Post laminectomy syndrome 08/12/2016   Migraine with aura and without status migrainosus, not intractable 10/15/2015   Intrinsic asthma 01/11/2010   Dyspnea 12/14/2009   DIABETES MELLITUS, TYPE II 12/11/2009   Hyperlipidemia 12/11/2009   MIGRAINE HEADACHE 12/11/2009   Hereditary and idiopathic peripheral neuropathy 12/11/2009   Hypertension 12/11/2009   Insomnia 12/11/2009   EDEMA 12/11/2009   Type 2 diabetes mellitus with diabetic polyneuropathy ([place]) 12/11/2009    Allyson Sabal [facility] 02/22/2021, 10:04 AM  [place], [place], [address] Phone: [phone]   Fax:  [phone]  Name: [name] MRN: [id number] Date of Birth: [birth year]   Manus Gunning, PT 02/22/21 10:04 AM

## 2021-02-24 ENCOUNTER — Other Ambulatory Visit: Payer: Self-pay

## 2021-02-24 ENCOUNTER — Encounter: Payer: Self-pay | Admitting: Physical Therapy

## 2021-02-24 ENCOUNTER — Ambulatory Visit: Payer: BC Managed Care – PPO | Admitting: Physical Therapy

## 2021-02-24 DIAGNOSIS — I890 Lymphedema, not elsewhere classified: Secondary | ICD-10-CM | POA: Diagnosis not present

## 2021-02-24 DIAGNOSIS — R293 Abnormal posture: Secondary | ICD-10-CM | POA: Diagnosis not present

## 2021-02-24 DIAGNOSIS — C50411 Malignant neoplasm of upper-outer quadrant of right female breast: Secondary | ICD-10-CM | POA: Diagnosis not present

## 2021-02-24 DIAGNOSIS — Z170 Estrogen receptor positive status [ER+]: Secondary | ICD-10-CM | POA: Diagnosis not present

## 2021-02-24 DIAGNOSIS — Z483 Aftercare following surgery for neoplasm: Secondary | ICD-10-CM | POA: Diagnosis not present

## 2021-02-24 NOTE — Therapy (Signed)
[place], [place], [address] Phone: [phone]   Fax:  [phone]  Physical Therapy Treatment  Patient Details  Name: [name] MRN: [id number] Date of Birth: [birth year] Referring Provider (PT): Dr. Marin Olp   Encounter Date: 02/24/2021   PT End of Session - 02/24/21 1002     Visit Number 4    Number of Visits 12    Date for PT Re-Evaluation 03/23/21    PT Start Time 0905    PT Stop Time 0959    PT Time Calculation (min) 54 min    Activity Tolerance Patient tolerated treatment well    Behavior During Therapy [facility] for tasks assessed/performed             Past Medical History:  Diagnosis Date   Allergies    Anxiety    Asthma    treated by pulmonologist    Atherosclerosis of aorta ([place])    noted on xray   Breast cancer ([place])    Right   Chronic pain    Depression    Diabetes ([place])    type II   Edema    Goals of care, counseling/discussion 09/02/2020   High cholesterol    History of nuclear stress test 11/12/2009   normal    Hypertension    IgG monoclonal gammopathy of uncertain significance    igG kappa monoclonal gammopathy of unkown significance (per notes from [employer]).   Insomnia    Low back pain    Migraine headache    Neuropathy associated with MGUS ([facility])    Peripheral neuropathy    PONV (postoperative nausea and vomiting)    hard to wake up   Sleep apnea    Smoldering multiple myeloma ([employer]) 12/04/2017   Vertigo     Past Surgical History:  Procedure Laterality Date   BACK SURGERY  2019   T10-S1 Fusion Dr. Hulda Marin SURGERY  2019   2 fusion in lower back (L4-S1) and neck   breast cyst removal age 53     BREAST LUMPECTOMY WITH RADIOACTIVE SEED AND SENTINEL LYMPH NODE BIOPSY Left 09/30/2020   Procedure: RADIOACTIVE SEED GUIDED LEFT BREAST LUMPECTOMY, LEFT AXILLARY SENTINEL LYMPH NODE BIOPSY;  Surgeon: Jovita Kussmaul, MD;  Location: [place] [facility];  Service: General;  Laterality: Left;   CESAREAN SECTION     x2   hernia repair at 89 months old     miscarriage D&C     NASAL SINUS SURGERY     x2   NECK SURGERY     pain stimulator in back     RE-EXCISION OF BREAST CANCER,SUPERIOR MARGINS Left 10/19/2020   Procedure: RE-EXCISION LEFT BREAST MARGINS;  Surgeon: Jovita Kussmaul, MD;  Location: [facility];  Service: General;  Laterality: Left;   TONSILLECTOMY      There were no vitals filed for this visit.   Subjective Assessment - 02/24/21 0907     Subjective The bandages feel ok. They have come down some.    Pertinent History Recent diagnosis of Lt breast cancer IDC stage IA with surgery 09/30/20 with 3 lymph nodes removed and then re-excision 10/19/20.  pt is followed by Dr. Marin Olp due to smoldering igG Kappa myeloma.  Other history includes: DM, Peripheral neuropathy, and HTN, cervical fusion with limited movement and back T10-S1, vertigo, recent fall with hand fracture    Patient Stated Goals decrease swelling/have comfortable sleeve    Currently  in Pain? No/denies    Pain Score 0-No pain                   LYMPHEDEMA/ONCOLOGY QUESTIONNAIRE - 02/24/21 0001       Left Upper Extremity Lymphedema   15 cm Proximal to Olecranon Process 32.5 cm (P)     10 cm Proximal to Olecranon Process 32.7 cm (P)     Olecranon Process 30.4 cm (P)     15 cm Proximal to Ulnar Styloid Process 25.8 cm (P)     10 cm Proximal to Ulnar Styloid Process 22.5 cm (P)     Just Proximal to Ulnar Styloid Process 17.1 cm (P)     Across Hand at [employer] 19.1 cm (P)     At [facility] of 2nd Digit 6.3 cm (P)                         [facility] Adult PT Treatment/Exercise - 02/24/21 0001       Manual Therapy   Manual Therapy Manual Lymphatic Drainage (MLD);Compression Bandaging    Manual Lymphatic Drainage (MLD) in supine with head of bed elevated: short neck, 5 diaphragmatic breaths, right axillary nodes and  establishment of interaxillary pathway, left inguinal nodes and establishment of axillo inguinal pathway, LUE working proximal to distal then retracing all steps while instructing pt in principles of MLD    Compression Bandaging TG soft from hand to axilla, Elastomull to fingers 1-5, 1-6 cm to hand and 1-12 cm short stretch compression bandages from wrist to axilla - had to remove hand bandage and reapply as pt felt it was too tight                                Plan - 02/24/21 1209     Clinical Impression Statement Pt left bandages intact since Monday. She reports they began to slide down last night. Remeasured circumferences today and pt demonstrates some reduction in edema around 0.5 cm at forearm and upper arm. Began MLD to LUE followed by compression bandaging. May need to add a third bandage for extra compression and better coverage. Will have pt try her compression sleeve again soon to assess if it still causes discomfort.    PT Frequency 2x / week    PT Duration 6 weeks    PT Treatment/Interventions ADLs/Self Care Home Management;Therapeutic exercise;Patient/family education;Manual techniques;Manual lymph drainage;Compression bandaging    PT Next Visit Plan instruct pt and daughter in MLD, if reduction plateaus have pt try compression sleeve again, if she still has pain with sleeve maybe try exo strong?, Review compression bandaging with daughter; stat and instruct in MLD and daughter education in MLD    PT Home Exercise Plan compression bandaging    Consulted and Agree with Plan of Care Patient;Family member/caregiver             Patient will benefit from skilled therapeutic intervention in order to improve the following deficits and impairments:  Increased edema, Postural dysfunction, Decreased knowledge of precautions  Visit Diagnosis: Acquired lymphedema     Problem List Patient Active Problem List   Diagnosis Date Noted   Goals of care,  counseling/discussion 09/02/2020   Malignant neoplasm of upper-outer quadrant of left breast in female, estrogen receptor positive ([place]) 08/27/2020   Iron deficiency anemia 10/09/2019   Impaired mobility and activities of daily living 08/30/2018   Multiple  myeloma ([place]) 08/30/2018   S/P spinal fusion 08/30/2018   Lumbar degenerative disc disease 08/27/2018   History of degenerative disc disease 08/15/2018   Neck pain 08/15/2018   Sensorineural hearing loss (SNHL), bilateral 08/15/2018   Tinnitus of both ears 08/15/2018   Smoldering multiple myeloma ([place]) 12/04/2017   Breakdown (mechanical) of implanted electronic neurostimulator, generator, initial encounter ([place]) 08/12/2016   Myalgia 08/12/2016   Post laminectomy syndrome 08/12/2016   Migraine with aura and without status migrainosus, not intractable 10/15/2015   Intrinsic asthma 01/11/2010   Dyspnea 12/14/2009   DIABETES MELLITUS, TYPE II 12/11/2009   Hyperlipidemia 12/11/2009   MIGRAINE HEADACHE 12/11/2009   Hereditary and idiopathic peripheral neuropathy 12/11/2009   Hypertension 12/11/2009   Insomnia 12/11/2009   EDEMA 12/11/2009   Type 2 diabetes mellitus with diabetic polyneuropathy ([place]) 12/11/2009    Allyson Sabal [facility] 02/24/2021, 12:11 PM  [place], [place], [address] Phone: [phone]   Fax:  [phone]  Name: [name] MRN: [id number] Date of Birth: 1951/08/12   Manus Gunning, PT 02/24/21 12:11 PM

## 2021-02-26 ENCOUNTER — Ambulatory Visit: Payer: Medicare Other | Admitting: Podiatry

## 2021-02-26 ENCOUNTER — Encounter (INDEPENDENT_AMBULATORY_CARE_PROVIDER_SITE_OTHER): Payer: Self-pay

## 2021-03-01 ENCOUNTER — Ambulatory Visit: Payer: BC Managed Care – PPO

## 2021-03-01 ENCOUNTER — Other Ambulatory Visit: Payer: Self-pay

## 2021-03-01 DIAGNOSIS — C50411 Malignant neoplasm of upper-outer quadrant of right female breast: Secondary | ICD-10-CM | POA: Diagnosis not present

## 2021-03-01 DIAGNOSIS — Z170 Estrogen receptor positive status [ER+]: Secondary | ICD-10-CM

## 2021-03-01 DIAGNOSIS — I890 Lymphedema, not elsewhere classified: Secondary | ICD-10-CM | POA: Diagnosis not present

## 2021-03-01 DIAGNOSIS — Z483 Aftercare following surgery for neoplasm: Secondary | ICD-10-CM

## 2021-03-01 DIAGNOSIS — R293 Abnormal posture: Secondary | ICD-10-CM

## 2021-03-01 NOTE — Patient Instructions (Addendum)
Pt was given written instructions for Left UE MLD

## 2021-03-01 NOTE — Therapy (Signed)
[place], [place], [address] Phone: [phone]   Fax:  [phone]  Physical Therapy Treatment  Patient Details  Name: [name] MRN: [id number] Date of Birth: 1951/05/10 Referring Provider (PT): Dr. Marin Olp   Encounter Date: 03/01/2021   PT End of Session - 03/01/21 1210     Visit Number 5    Number of Visits 12    Date for PT Re-Evaluation 03/23/21    PT Start Time 1005    PT Stop Time 1103    PT Time Calculation (min) 58 min    Activity Tolerance Patient tolerated treatment well    Behavior During Therapy [facility] for tasks assessed/performed             Past Medical History:  Diagnosis Date   Allergies    Anxiety    Asthma    treated by pulmonologist    Atherosclerosis of aorta ([place])    noted on xray   Breast cancer ([place])    Right   Chronic pain    Depression    Diabetes ([place])    type II   Edema    Goals of care, counseling/discussion 09/02/2020   High cholesterol    History of nuclear stress test 11/12/2009   normal    Hypertension    IgG monoclonal gammopathy of uncertain significance    igG kappa monoclonal gammopathy of unkown significance (per notes from [employer]).   Insomnia    Low back pain    Migraine headache    Neuropathy associated with MGUS ([facility])    Peripheral neuropathy    PONV (postoperative nausea and vomiting)    hard to wake up   Sleep apnea    Smoldering multiple myeloma ([place]) 12/04/2017   Vertigo     Past Surgical History:  Procedure Laterality Date   BACK SURGERY  2019   T10-S1 Fusion Dr. Hulda Marin SURGERY  2019   2 fusion in lower back (L4-S1) and neck   breast cyst removal age 26     BREAST LUMPECTOMY WITH RADIOACTIVE SEED AND SENTINEL LYMPH NODE BIOPSY Left 09/30/2020   Procedure: RADIOACTIVE SEED GUIDED LEFT BREAST LUMPECTOMY, LEFT AXILLARY SENTINEL LYMPH NODE BIOPSY;  Surgeon: Jovita Kussmaul, MD;  Location: [place];  Service: General;  Laterality: Left;   CESAREAN SECTION     x2   hernia repair at 67 months old     miscarriage D&C     NASAL SINUS SURGERY     x2   NECK SURGERY     pain stimulator in back     RE-EXCISION OF BREAST CANCER,SUPERIOR MARGINS Left 10/19/2020   Procedure: RE-EXCISION LEFT BREAST MARGINS;  Surgeon: Jovita Kussmaul, MD;  Location: [place];  Service: General;  Laterality: Left;   TONSILLECTOMY      There were no vitals filed for this visit.   Subjective Assessment - 03/01/21 1008     Subjective After I left last time my hand started to tingle so I loosened it up and wore the wrap.  I took it off Friday morning and put on my sleeve and my thumb was numb and is still numb.  I started back to wearing the sleeve in the daytime.  The foam I used in the beginning helped the pain but made my arm red.    Pertinent History Recent diagnosis of Lt breast cancer IDC stage IA with surgery 09/30/20 with  3 lymph nodes removed and then re-excision 10/19/20.  pt is followed by Dr. Marin Olp due to smoldering igG Kappa myeloma.  Other history includes: DM, Peripheral neuropathy, and HTN, cervical fusion with limited movement and back T10-S1, vertigo, recent fall with hand fracture    Patient Stated Goals decrease swelling/have comfortable sleeve    Currently in Pain? No/denies    Pain Score 0-No pain                   LYMPHEDEMA/ONCOLOGY QUESTIONNAIRE - 03/01/21 0001       Right Upper Extremity Lymphedema   10 cm Proximal to Ulnar Styloid Process 22.2 cm    Just Proximal to Ulnar Styloid Process 17.7 cm    At Base of 2nd Digit 6.3 cm      Left Upper Extremity Lymphedema   15 cm Proximal to Olecranon Process 32.7 cm    10 cm Proximal to Olecranon Process 32.3 cm    Olecranon Process 29.6 cm    15 cm Proximal to Ulnar Styloid Process 25.7 cm    10 cm Proximal to Ulnar Styloid Process 23.1 cm    Just Proximal to Ulnar Styloid Process 17.4 cm    At Base  of 2nd Digit 6.3 cm                        [facility] Adult PT Treatment/Exercise - 03/01/21 0001       Manual Therapy   Manual Lymphatic Drainage (MLD) in supine with head of bed elevated: short neck, 5 diaphragmatic breaths, right axillary nodes and establishment of interaxillary pathway, left inguinal nodes and establishment of axillo inguinal pathway, LUE working proximal to distal then retracing all steps while instructing pt and having her try all techniques .  Assisted pt with compression sleeve at completion of MLD                   PT Education - 03/01/21 1209     Education Details Pt was educated in self MLD and given written instructions    Person(s) Educated Patient    Methods Explanation;Demonstration;Verbal cues;Tactile cues;Handout    Comprehension Returned demonstration;Need further instruction;Verbal cues required;Tactile cues required   pt did very well overall                       Plan - 03/01/21 1211     Clinical Impression Statement Pt loosened the wrap after leaving last time because it was too tight and wore despite having her thumb go to sleep.  She removed the wrap entirely and her thumb is still numb.  She has been wearing the sleeve and gauntlet which she says has been a little more comfortable.  She would probably benefit from a flat knit sleeve and I will try to get referral today.  She expressed interest in learning self MLD so I performed on her and had her practice each step after me.  She did very well overall with her technique and had a good understanding of sequence.  We applied her sleeve at completion of MLD and placed a small piece of foam under the top edge medially to keep it from rolling. She was reminded that the wrap must always come off if there is pain , numbness or tingling.  She didn't think to remove it and have her re-do it the first day when it felt too tight. Her daughter was not with her today.  Personal  Factors and Comorbidities Age;Fitness;Comorbidity 2    Comorbidities neuropathy, myeloma,    Examination-Activity Limitations Carry;Dressing;Reach Overhead    Examination-Participation Restrictions Cleaning;Yard Work    Stability/Clinical Decision Making Stable/Uncomplicated    Rehab Potential Excellent    PT Frequency 2x / week    PT Duration 6 weeks    PT Treatment/Interventions ADLs/Self Care Home Management;Therapeutic exercise;Patient/family education;Manual techniques;Manual lymph drainage;Compression bandaging    PT Next Visit Plan instruct pt and daughter in MLD, if reduction plateaus have pt try compression sleeve again, if she still has pain with sleeve maybe try exo strong?, Review compression bandaging with daughter; start and instruct in MLD and daughter education in MLD. requested script from MD for exostrong.    PT Home Exercise Plan compression bandaging, self MLD    Consulted and Agree with Plan of Care Patient             Patient will benefit from skilled therapeutic intervention in order to improve the following deficits and impairments:  Increased edema, Postural dysfunction, Decreased knowledge of precautions  Visit Diagnosis: Acquired lymphedema  Aftercare following surgery for neoplasm  Malignant neoplasm of upper-outer quadrant of right breast in female, estrogen receptor positive ([place])  Abnormal posture     Problem List Patient Active Problem List   Diagnosis Date Noted   Goals of care, counseling/discussion 09/02/2020   Malignant neoplasm of upper-outer quadrant of left breast in female, estrogen receptor positive ([place]) 08/27/2020   Iron deficiency anemia 10/09/2019   Impaired mobility and activities of daily living 08/30/2018   Multiple myeloma ([place]) 08/30/2018   S/P spinal fusion 08/30/2018   Lumbar degenerative disc disease 08/27/2018   History of degenerative disc disease 08/15/2018   Neck pain 08/15/2018   Sensorineural hearing loss (SNHL),  bilateral 08/15/2018   Tinnitus of both ears 08/15/2018   Smoldering multiple myeloma ([place]) 12/04/2017   Breakdown (mechanical) of implanted electronic neurostimulator, generator, initial encounter ([place]) 08/12/2016   Myalgia 08/12/2016   Post laminectomy syndrome 08/12/2016   Migraine with aura and without status migrainosus, not intractable 10/15/2015   Intrinsic asthma 01/11/2010   Dyspnea 12/14/2009   DIABETES MELLITUS, TYPE II 12/11/2009   Hyperlipidemia 12/11/2009   MIGRAINE HEADACHE 12/11/2009   Hereditary and idiopathic peripheral neuropathy 12/11/2009   Hypertension 12/11/2009   Insomnia 12/11/2009   EDEMA 12/11/2009   Type 2 diabetes mellitus with diabetic polyneuropathy ([place]) 12/11/2009    Claris Pong 03/01/2021, 12:20 PM  [place], [place], [address] Phone: [phone]   Fax:  [phone]  Name: [name] MRN: [id number] Date of Birth: August 11, 1951 Cheral Almas, PT 03/01/21 12:22 PM

## 2021-03-02 ENCOUNTER — Telehealth: Payer: Self-pay | Admitting: *Deleted

## 2021-03-02 NOTE — Telephone Encounter (Signed)
Call received from [facility] with [facility] requesting a prescription for a Flat kint compression sleeve/glove or gauntlet to be faxed to [phone].  Prescription signed per Dr Marin Olp and faxed as requested.

## 2021-03-03 ENCOUNTER — Encounter: Payer: Self-pay | Admitting: Physical Therapy

## 2021-03-08 ENCOUNTER — Other Ambulatory Visit: Payer: Self-pay

## 2021-03-08 ENCOUNTER — Ambulatory Visit: Payer: BC Managed Care – PPO

## 2021-03-08 DIAGNOSIS — Z483 Aftercare following surgery for neoplasm: Secondary | ICD-10-CM | POA: Diagnosis not present

## 2021-03-08 DIAGNOSIS — C50411 Malignant neoplasm of upper-outer quadrant of right female breast: Secondary | ICD-10-CM

## 2021-03-08 DIAGNOSIS — R293 Abnormal posture: Secondary | ICD-10-CM | POA: Diagnosis not present

## 2021-03-08 DIAGNOSIS — I890 Lymphedema, not elsewhere classified: Secondary | ICD-10-CM | POA: Diagnosis not present

## 2021-03-08 DIAGNOSIS — Z170 Estrogen receptor positive status [ER+]: Secondary | ICD-10-CM

## 2021-03-08 NOTE — Therapy (Signed)
[place], [place], [address] Phone: [phone]   Fax:  [phone]  Physical Therapy Treatment  Patient Details  Name: [name] MRN: [id number] Date of Birth: 08/10/1951 Referring Provider (PT): Dr. Marin Olp   Encounter Date: 03/08/2021   PT End of Session - 03/08/21 1743     Visit Number 6    Number of Visits 12    Date for PT Re-Evaluation 03/23/21    PT Start Time 0403    PT Stop Time 0500    PT Time Calculation (min) 57 min    Activity Tolerance Patient tolerated treatment well    Behavior During Therapy [facility] for tasks assessed/performed             Past Medical History:  Diagnosis Date   Allergies    Anxiety    Asthma    treated by pulmonologist    Atherosclerosis of aorta ([place])    noted on xray   Breast cancer ([place])    Right   Chronic pain    Depression    Diabetes ([place])    type II   Edema    Goals of care, counseling/discussion 09/02/2020   High cholesterol    History of nuclear stress test 11/12/2009   normal    Hypertension    IgG monoclonal gammopathy of uncertain significance    igG kappa monoclonal gammopathy of unkown significance (per notes from [employer]).   Insomnia    Low back pain    Migraine headache    Neuropathy associated with MGUS ([facility])    Peripheral neuropathy    PONV (postoperative nausea and vomiting)    hard to wake up   Sleep apnea    Smoldering multiple myeloma ([place]) 12/04/2017   Vertigo     Past Surgical History:  Procedure Laterality Date   BACK SURGERY  2019   T10-S1 Fusion Dr. Hulda Marin SURGERY  2019   2 fusion in lower back (L4-S1) and neck   breast cyst removal age 19     BREAST LUMPECTOMY WITH RADIOACTIVE SEED AND SENTINEL LYMPH NODE BIOPSY Left 09/30/2020   Procedure: RADIOACTIVE SEED GUIDED LEFT BREAST LUMPECTOMY, LEFT AXILLARY SENTINEL LYMPH NODE BIOPSY;  Surgeon: Jovita Kussmaul, MD;  Location: [place];  Service: General;  Laterality: Left;   CESAREAN SECTION     x2   hernia repair at 75 months old     miscarriage D&C     NASAL SINUS SURGERY     x2   NECK SURGERY     pain stimulator in back     RE-EXCISION OF BREAST CANCER,SUPERIOR MARGINS Left 10/19/2020   Procedure: RE-EXCISION LEFT BREAST MARGINS;  Surgeon: Jovita Kussmaul, MD;  Location: [place];  Service: General;  Laterality: Left;   TONSILLECTOMY      There were no vitals filed for this visit.   Subjective Assessment - 03/08/21 1613     Subjective Tried the MLD at home but didn't look at the paper. I did OK , but not great.  My husband helps me with the sleeve at home. My thumb is still numb    Pertinent History Recent diagnosis of Lt breast cancer IDC stage IA with surgery 09/30/20 with 3 lymph nodes removed and then re-excision 10/19/20.  pt is followed by Dr. Marin Olp due to smoldering igG Kappa myeloma.  Other history includes: DM, Peripheral neuropathy, and HTN, cervical fusion with limited  movement and back T10-S1, vertigo, recent fall with hand fracture    Patient Stated Goals decrease swelling/have comfortable sleeve    Currently in Pain? No/denies    Pain Score 0-No pain                   LYMPHEDEMA/ONCOLOGY QUESTIONNAIRE - 03/08/21 0001       Right Upper Extremity Lymphedema   10 cm Proximal to Olecranon Process 33.3 cm    Olecranon Process 29.7 cm    10 cm Proximal to Ulnar Styloid Process 22.6 cm    Just Proximal to Ulnar Styloid Process 17.8 cm    At Base of 2nd Digit 6.4 cm      Left Upper Extremity Lymphedema   15 cm Proximal to Olecranon Process 32.8 cm    10 cm Proximal to Olecranon Process 32.3 cm    Olecranon Process 29.8 cm    15 cm Proximal to Ulnar Styloid Process 26 cm    10 cm Proximal to Ulnar Styloid Process 23.1 cm    Just Proximal to Ulnar Styloid Process 17.3 cm    At Base of 2nd Digit 6.4 cm                        [facility] Adult PT  Treatment/Exercise - 03/08/21 0001       Manual Therapy   Manual Therapy Manual Lymphatic Drainage (MLD)    Edema Management pt remeasured with good results    Manual Lymphatic Drainage (MLD) in sitting so pt can observe VO:[id number] demonstrated technique while pt performed; short neck, 5 diaphragmatic breaths, right axillary nodes and establishment of interaxillary pathway, left inguinal nodes and establishment of axillo inguinal pathway, LUE working proximal to distal then retracing all steps while instructing pt in principles of MLD and pt peforming all techniques                         PT Long Term Goals - 03/08/21 1758       PT LONG TERM GOAL #1   Title Pt/family memeber  will be independent with performance of MLD    Time 6    Period Weeks    Status New    Target Date 03/23/21      PT LONG TERM GOAL #2   Title Pt will be fit for flat knit compression sleeve for increased comfort    Time 6    Period Weeks    Status New    Target Date 03/23/21      PT LONG TERM GOAL #3   Title Pt will be independent with donning compression garment with device prn    Time 6    Period Weeks    Status New    Target Date 03/23/21      PT LONG TERM GOAL #4   Title Pt will have improved swelling noted in left UE    Time 6    Period Weeks    Status New    Target Date 03/23/21                   Plan - 03/08/21 1744     Clinical Impression Statement Lengthy discussion today abot sleeve and where pt will get  fit,[facility] or [facility].  She already had an MD appt on Friday so we decided on [facility].  she was given script and instructions for what she  would do best with.(flat knit exostrong or similar)  I also showed her the tribute wrap for night and suggested she consider that if her insurance covers it.  We reviewed self MLD today and she required multiple VC's initially for direction of stretch, but improved with repetition and seems to have a good  understanding of the sequence.  We will review again next visit.  Measurements were taken and look very good overall.  she was assisted with donning her sleeve at the end of session.    Personal Factors and Comorbidities Age;Fitness;Comorbidity 2    Comorbidities neuropathy, myeloma,    Examination-Activity Limitations Carry;Dressing;Reach Overhead    Examination-Participation Restrictions Cleaning;Yard Work    Stability/Clinical Decision Making Stable/Uncomplicated    Rehab Potential Excellent    PT Frequency 2x / week    PT Duration 6 weeks    PT Treatment/Interventions ADLs/Self Care Home Management;Therapeutic exercise;Patient/family education;Manual techniques;Manual lymph drainage;Compression bandaging    PT Next Visit Plan instruct daughter in MLD if she wishes, review bandaging prn, pt to be measured for exostrong or flat knit at [facility] compression bandaging, self MLD    Recommended Other Services sleeve(flat knit), tribute wrap with hand    Consulted and Agree with Plan of Care Patient    Family Member Consulted daughter Nira Conn in attendance with pt today             Patient will benefit from skilled therapeutic intervention in order to improve the following deficits and impairments:  Increased edema, Postural dysfunction, Decreased knowledge of precautions  Visit Diagnosis: Acquired lymphedema  Aftercare following surgery for neoplasm  Malignant neoplasm of upper-outer quadrant of right breast in female, estrogen receptor positive ([place])  Abnormal posture     Problem List Patient Active Problem List   Diagnosis Date Noted   Goals of care, counseling/discussion 09/02/2020   Malignant neoplasm of upper-outer quadrant of left breast in female, estrogen receptor positive ([place]) 08/27/2020   Iron deficiency anemia 10/09/2019   Impaired mobility and activities of daily living 08/30/2018   Multiple myeloma ([place]) 08/30/2018   S/P spinal  fusion 08/30/2018   Lumbar degenerative disc disease 08/27/2018   History of degenerative disc disease 08/15/2018   Neck pain 08/15/2018   Sensorineural hearing loss (SNHL), bilateral 08/15/2018   Tinnitus of both ears 08/15/2018   Smoldering multiple myeloma ([place]) 12/04/2017   Breakdown (mechanical) of implanted electronic neurostimulator, generator, initial encounter ([place]) 08/12/2016   Myalgia 08/12/2016   Post laminectomy syndrome 08/12/2016   Migraine with aura and without status migrainosus, not intractable 10/15/2015   Intrinsic asthma 01/11/2010   Dyspnea 12/14/2009   DIABETES MELLITUS, TYPE II 12/11/2009   Hyperlipidemia 12/11/2009   MIGRAINE HEADACHE 12/11/2009   Hereditary and idiopathic peripheral neuropathy 12/11/2009   Hypertension 12/11/2009   Insomnia 12/11/2009   EDEMA 12/11/2009   Type 2 diabetes mellitus with diabetic polyneuropathy ([place]) 12/11/2009    Claris Pong 03/08/2021, 6:10 PM  [place], [place], [address] Phone: [phone]   Fax:  [phone]  Name: [name] MRN: [id number] Date of Birth: 03/20/1951  Cheral Almas, PT 03/08/21 6:12 PM

## 2021-03-10 ENCOUNTER — Ambulatory Visit: Payer: BC Managed Care – PPO

## 2021-03-10 ENCOUNTER — Other Ambulatory Visit: Payer: Self-pay

## 2021-03-10 DIAGNOSIS — I890 Lymphedema, not elsewhere classified: Secondary | ICD-10-CM

## 2021-03-10 DIAGNOSIS — R293 Abnormal posture: Secondary | ICD-10-CM | POA: Diagnosis not present

## 2021-03-10 DIAGNOSIS — Z483 Aftercare following surgery for neoplasm: Secondary | ICD-10-CM | POA: Diagnosis not present

## 2021-03-10 DIAGNOSIS — Z170 Estrogen receptor positive status [ER+]: Secondary | ICD-10-CM | POA: Diagnosis not present

## 2021-03-10 DIAGNOSIS — C50411 Malignant neoplasm of upper-outer quadrant of right female breast: Secondary | ICD-10-CM | POA: Diagnosis not present

## 2021-03-10 NOTE — Therapy (Signed)
[place], [place], [address] Phone: [phone]   Fax:  [phone]  Physical Therapy Treatment  Patient Details  Name: [name] MRN: [id number] Date of Birth: [birth year] Referring Provider (PT): Dr. Marin Olp   Encounter Date: 03/10/2021   PT End of Session - 03/10/21 1001     Visit Number 7    Number of Visits 12    Date for PT Re-Evaluation 03/23/21    PT Start Time 0906    PT Stop Time 0956    PT Time Calculation (min) 50 min    Activity Tolerance Patient tolerated treatment well    Behavior During Therapy [facility] for tasks assessed/performed             Past Medical History:  Diagnosis Date   Allergies    Anxiety    Asthma    treated by pulmonologist    Atherosclerosis of aorta ([place])    noted on xray   Breast cancer ([place])    Right   Chronic pain    Depression    Diabetes ([place])    type II   Edema    Goals of care, counseling/discussion 09/02/2020   High cholesterol    History of nuclear stress test 11/12/2009   normal    Hypertension    IgG monoclonal gammopathy of uncertain significance    igG kappa monoclonal gammopathy of unkown significance (per notes from [employer]).   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 Hulda Marin SURGERY  2019   2 fusion in lower back (L4-S1) and neck   breast cyst removal age 61     BREAST LUMPECTOMY WITH RADIOACTIVE SEED AND SENTINEL LYMPH NODE BIOPSY Left 09/30/2020   Procedure: RADIOACTIVE SEED GUIDED LEFT BREAST LUMPECTOMY, LEFT AXILLARY SENTINEL LYMPH NODE BIOPSY;  Surgeon: Jovita Kussmaul, MD;  Location: [facility];  Service: General;  Laterality: Left;   CESAREAN SECTION     x2   hernia repair at 37 months old     miscarriage D&C     NASAL SINUS SURGERY     x2   NECK SURGERY     pain stimulator in back     RE-EXCISION OF BREAST CANCER,SUPERIOR MARGINS Left 10/19/2020   Procedure: RE-EXCISION LEFT BREAST MARGINS;  Surgeon: Jovita Kussmaul, MD;  Location: [facility];  Service: General;  Laterality: Left;   TONSILLECTOMY      There were no vitals filed for this visit.   Subjective Assessment - 03/10/21 0906     Subjective I didn't put on sleeve today because I knew I was coming.  I didn't call yet about the sleeve but I will today.  Practiced the MLD and think I did better.    Pertinent History Recent diagnosis of Lt breast cancer IDC stage IA with surgery 09/30/20 with 3 lymph nodes removed and then re-excision 10/19/20.  pt is followed by Dr. Marin Olp due to smoldering igG Kappa myeloma.  Other history includes: DM, Peripheral neuropathy, and HTN, cervical fusion with limited  movement and back T10-S1, vertigo, recent fall with hand fracture    Patient Stated Goals decrease swelling/have comfortable sleeve    Currently in Pain? Yes    Pain Score 3     Pain Location Breast    Pain Orientation Left    Pain Descriptors / Indicators Aching    Pain Type Acute pain                               [facility] Adult PT Treatment/Exercise - 03/10/21 0001       Manual Therapy   Manual Therapy Manual Lymphatic Drainage (MLD)    Edema Management assisted pt with compression sleeve.  Showed how to use rubber gloves to don and had pt try it    Manual Lymphatic Drainage (MLD) in supine with head of bed elevated: short neck, 5 diaphragmatic breaths, right axillary nodes and establishment of interaxillary pathway, left inguinal nodes and establishment of axillo inguinal pathway, LUE working proximal to distal then retracing all steps while instructing pt in principles of MLD  and practice trunk portions                         PT Long Term Goals - 03/08/21 1758       PT LONG TERM GOAL #1   Title Pt/family memeber  will be independent with performance of MLD    Time 6    Period Weeks    Status New    Target Date 03/23/21      PT LONG TERM GOAL #2   Title Pt will be fit for flat knit compression sleeve for increased comfort    Time 6    Period Weeks    Status New    Target Date 03/23/21      PT LONG TERM GOAL #3   Title Pt will be independent with donning compression garment with device prn    Time 6    Period Weeks    Status New    Target Date 03/23/21      PT LONG TERM GOAL #4   Title Pt will have improved swelling noted in left UE    Time 6    Period Weeks    Status New    Target Date 03/23/21                   Plan - 03/10/21 1002     Clinical Impression Statement Assessed painful area at left trunk and pt did have mild swelling there.Therapy consisted of MLD and verbal review with pt while therapist performed and pt practicing trunk portions.  Pain at trunk was gone after MLD.Pt will go to a [facility] place today to look into getting her compression sleeve.  She will practice her self MLD this week and may only require 1 visit next week.  pt did quite well practicing applying her compression sleeve by herself.    Personal Factors and Comorbidities Age;Fitness;Comorbidity 2    Comorbidities neuropathy, myeloma,    Examination-Activity Limitations Carry;Dressing;Reach Overhead    Stability/Clinical Decision Making Stable/Uncomplicated    Rehab Potential Excellent    PT Frequency 2x / week    PT Duration 6 weeks    PT Treatment/Interventions ADLs/Self Care Home Management;Therapeutic exercise;Patient/family education;Manual techniques;Manual lymph drainage;Compression bandaging    PT Next Visit Plan Measure,Review self MLD, review donning sleeve. (pt to be fit for sleeve today she  hopes) May only need 1 visit next week     PT Home Exercise Plan sleeve, self MLD, Bandaging prn (Pt doesn't tolerate bandaging well    Consulted and Agree with Plan of Care Patient    Family Member Consulted daughter Nira Conn in attendance with pt today             Patient will benefit from skilled therapeutic intervention in order to improve the following deficits and impairments:  Increased edema, Postural dysfunction, Decreased knowledge of precautions  Visit Diagnosis: Acquired lymphedema  Aftercare following surgery for neoplasm  Malignant neoplasm of upper-outer quadrant of right breast in female, estrogen receptor positive ([place])  Abnormal posture     Problem List Patient Active Problem List   Diagnosis Date Noted   Goals of care, counseling/discussion 09/02/2020   Malignant neoplasm of upper-outer quadrant of left breast in female, estrogen receptor positive ([place]) 08/27/2020   Iron deficiency anemia 10/09/2019   Impaired mobility and activities of daily living 08/30/2018   Multiple myeloma ([place]) 08/30/2018   S/P spinal fusion 08/30/2018   Lumbar degenerative disc disease 08/27/2018   History of degenerative disc disease 08/15/2018   Neck pain 08/15/2018   Sensorineural hearing loss (SNHL), bilateral 08/15/2018   Tinnitus of both ears 08/15/2018   Smoldering multiple myeloma ([place]) 12/04/2017   Breakdown (mechanical) of implanted electronic neurostimulator, generator, initial encounter ([place]) 08/12/2016   Myalgia 08/12/2016   Post laminectomy syndrome 08/12/2016   Migraine with aura and without status migrainosus, not intractable 10/15/2015   Intrinsic asthma 01/11/2010   Dyspnea 12/14/2009   DIABETES MELLITUS, TYPE II 12/11/2009   Hyperlipidemia 12/11/2009   MIGRAINE HEADACHE 12/11/2009   Hereditary and idiopathic peripheral neuropathy 12/11/2009   Hypertension 12/11/2009   Insomnia 12/11/2009   EDEMA 12/11/2009   Type 2 diabetes mellitus with diabetic polyneuropathy ([place]) 12/11/2009    Elsie Ra  Ily Denno 03/10/2021, 10:08 AM  [place], [place], [address] Phone: [phone]   Fax:  [phone]  Name: [name] MRN: [id number] Date of Birth: Feb 23, 1951 Cheral Almas, PT 03/10/21 10:09 AM

## 2021-03-12 ENCOUNTER — Encounter: Payer: Self-pay | Admitting: Podiatry

## 2021-03-12 ENCOUNTER — Ambulatory Visit (INDEPENDENT_AMBULATORY_CARE_PROVIDER_SITE_OTHER): Payer: BC Managed Care – PPO | Admitting: Podiatry

## 2021-03-12 ENCOUNTER — Encounter: Payer: Self-pay | Admitting: Family

## 2021-03-12 ENCOUNTER — Other Ambulatory Visit: Payer: Self-pay

## 2021-03-12 DIAGNOSIS — Z794 Long term (current) use of insulin: Secondary | ICD-10-CM | POA: Diagnosis not present

## 2021-03-12 DIAGNOSIS — E1142 Type 2 diabetes mellitus with diabetic polyneuropathy: Secondary | ICD-10-CM | POA: Diagnosis not present

## 2021-03-12 DIAGNOSIS — M79674 Pain in right toe(s): Secondary | ICD-10-CM

## 2021-03-12 DIAGNOSIS — M79675 Pain in left toe(s): Secondary | ICD-10-CM | POA: Diagnosis not present

## 2021-03-12 DIAGNOSIS — B351 Tinea unguium: Secondary | ICD-10-CM

## 2021-03-16 ENCOUNTER — Encounter: Payer: Self-pay | Admitting: Podiatry

## 2021-03-16 DIAGNOSIS — Z79891 Long term (current) use of opiate analgesic: Secondary | ICD-10-CM | POA: Diagnosis not present

## 2021-03-16 DIAGNOSIS — G894 Chronic pain syndrome: Secondary | ICD-10-CM | POA: Diagnosis not present

## 2021-03-16 DIAGNOSIS — M961 Postlaminectomy syndrome, not elsewhere classified: Secondary | ICD-10-CM | POA: Diagnosis not present

## 2021-03-16 DIAGNOSIS — E1142 Type 2 diabetes mellitus with diabetic polyneuropathy: Secondary | ICD-10-CM | POA: Diagnosis not present

## 2021-03-16 NOTE — Progress Notes (Signed)
  Subjective:  Patient ID: [name], female    DOB: Mar 13, 1951,  MRN: [id number]  Chief Complaint  Patient presents with   Diabetes    Diabetic foot exam  Nail trim  Neuropathy    70 y.o. female returns for the above complaint.  Patient presents with thickened elongated dystrophic toenails x10.  Painful to touch.  Patient would like to have them debrided down she not able to do it herself.  She denies any other acute complaints.  She is a diabetic with last A1c of 6.1  Objective:  There were no vitals filed for this visit. Podiatric Exam: Vascular: dorsalis pedis and posterior tibial pulses are palpable bilateral. Capillary return is immediate. Temperature gradient is WNL. Skin turgor WNL  Sensorium: Normal Semmes Weinstein monofilament test. Normal tactile sensation bilaterally. Nail Exam: Pt has thick disfigured discolored nails with subungual debris noted bilateral entire nail hallux through fifth toenails.  Pain on palpation to the nails. Ulcer Exam: There is no evidence of ulcer or pre-ulcerative changes or infection. Orthopedic Exam: Muscle tone and strength are WNL. No limitations in general ROM. No crepitus or effusions noted. HAV  B/L.  Hammer toes 2-5  B/L. Skin: No Porokeratosis. No infection or ulcers    Assessment & Plan:   1. Type 2 diabetes mellitus with diabetic polyneuropathy, with long-term current use of insulin (HCC)   2. Pain due to onychomycosis of toenails of both feet     Patient was evaluated and treated and all questions answered.  Onychomycosis with pain  -Nails palliatively debrided as below. -Educated on self-care  Procedure: Nail Debridement Rationale: pain  Type of Debridement: manual, sharp debridement. Instrumentation: Nail nipper, rotary burr. Number of Nails: 10  Procedures and Treatment: Consent by patient was obtained for treatment procedures. The patient understood the discussion of treatment and procedures well. All questions  were answered thoroughly reviewed. Debridement of mycotic and hypertrophic toenails, 1 through 5 bilateral and clearing of subungual debris. No ulceration, no infection noted.  Return Visit-Office Procedure: Patient instructed to return to the office for a follow up visit 3 months for continued evaluation and treatment.  Boneta Lucks, DPM    Return in about 3 months (around 06/12/2021).

## 2021-03-17 ENCOUNTER — Encounter: Payer: BC Managed Care – PPO | Admitting: Physical Therapy

## 2021-03-19 ENCOUNTER — Encounter: Payer: BC Managed Care – PPO | Admitting: Physical Therapy

## 2021-03-22 ENCOUNTER — Other Ambulatory Visit: Payer: Self-pay

## 2021-03-22 ENCOUNTER — Ambulatory Visit: Payer: BC Managed Care – PPO | Attending: General Surgery

## 2021-03-22 DIAGNOSIS — Z170 Estrogen receptor positive status [ER+]: Secondary | ICD-10-CM | POA: Insufficient documentation

## 2021-03-22 DIAGNOSIS — Z483 Aftercare following surgery for neoplasm: Secondary | ICD-10-CM | POA: Diagnosis not present

## 2021-03-22 DIAGNOSIS — I890 Lymphedema, not elsewhere classified: Secondary | ICD-10-CM | POA: Insufficient documentation

## 2021-03-22 DIAGNOSIS — C50411 Malignant neoplasm of upper-outer quadrant of right female breast: Secondary | ICD-10-CM | POA: Diagnosis not present

## 2021-03-22 DIAGNOSIS — R293 Abnormal posture: Secondary | ICD-10-CM | POA: Insufficient documentation

## 2021-03-22 NOTE — Therapy (Signed)
[place], [place], [address] Phone: [phone]   Fax:  [phone]  Physical Therapy Treatment  Patient Details  Name: [name] MRN: [id number] Date of Birth: 09-05-1951 Referring Provider (PT): Dr. Marin Olp   Encounter Date: 03/22/2021   PT End of Session - 03/22/21 1204     Visit Number 8    Number of Visits 12    Date for PT Re-Evaluation 03/23/21    PT Start Time 1118    PT Stop Time 1201    PT Time Calculation (min) 43 min    Activity Tolerance Patient tolerated treatment well    Behavior During Therapy [facility] for tasks assessed/performed             Past Medical History:  Diagnosis Date   Allergies    Anxiety    Asthma    treated by pulmonologist    Atherosclerosis of aorta ([place])    noted on xray   Breast cancer ([place])    Right   Chronic pain    Depression    Diabetes ([place])    type II   Edema    Goals of care, counseling/discussion 09/02/2020   High cholesterol    History of nuclear stress test 11/12/2009   normal    Hypertension    IgG monoclonal gammopathy of uncertain significance    igG kappa monoclonal gammopathy of unkown significance (per notes from [employer]).   Insomnia    Low back pain    Migraine headache    Neuropathy associated with MGUS (HCC)    Peripheral neuropathy    PONV (postoperative nausea and vomiting)    hard to wake up   Sleep apnea    Smoldering multiple myeloma ([place]) 12/04/2017   Vertigo     Past Surgical History:  Procedure Laterality Date   BACK SURGERY  2019   T10-S1 Fusion Dr. Hulda Marin SURGERY  2019   2 fusion in lower back (L4-S1) and neck   breast cyst removal age 70     BREAST LUMPECTOMY WITH RADIOACTIVE SEED AND SENTINEL LYMPH NODE BIOPSY Left 09/30/2020   Procedure: RADIOACTIVE SEED GUIDED LEFT BREAST LUMPECTOMY, LEFT AXILLARY SENTINEL LYMPH NODE BIOPSY;  Surgeon: Jovita Kussmaul, MD;  Location: [facility];  Service: General;  Laterality: Left;   CESAREAN SECTION     x2   hernia repair at 70 months old     miscarriage D&C     NASAL SINUS SURGERY     x2   NECK SURGERY     pain stimulator in back     RE-EXCISION OF BREAST CANCER,SUPERIOR MARGINS Left 10/19/2020   Procedure: RE-EXCISION LEFT BREAST MARGINS;  Surgeon: Jovita Kussmaul, MD;  Location: [facility];  Service: General;  Laterality: Left;   TONSILLECTOMY      There were no vitals filed for this visit.   Subjective Assessment - 03/22/21 1120     Subjective Arm has been doing well and doesn't seem as swollen.  Has been doing the MLD but not every day.  The sleeve is in but she hasn't picked up yet.    Pertinent History Recent diagnosis of Lt breast cancer IDC stage IA with surgery 09/30/20 with 3 lymph nodes removed and then re-excision 10/19/20.  pt is followed by Dr. Marin Olp due to smoldering igG Kappa myeloma.  Other history includes: DM, Peripheral neuropathy, and HTN, cervical fusion with limited movement and  back T10-S1, vertigo, recent fall with hand fracture    Patient Stated Goals decrease swelling/have comfortable sleeve    Currently in Pain? No/denies    Pain Score 0-No pain                   LYMPHEDEMA/ONCOLOGY QUESTIONNAIRE - 03/22/21 0001       Left Upper Extremity Lymphedema   15 cm Proximal to Olecranon Process 31.9 cm    10 cm Proximal to Olecranon Process 33 cm    Olecranon Process 29.5 cm    10 cm Proximal to Ulnar Styloid Process 23.1 cm    Just Proximal to Ulnar Styloid Process 17.3 cm    At Base of 2nd Digit 6.3 cm                        [facility] Adult PT Treatment/Exercise - 03/22/21 0001       Manual Therapy   Manual Therapy Manual Lymphatic Drainage (MLD)    Edema Management assisted pt with compression sleeve.  Showed how to use rubber gloves to don and had pt try it    Manual Lymphatic Drainage (MLD) in supine with head of bed elevated: short neck, 5  diaphragmatic breaths, right and left axillary nodes and establishment of interaxillary pathway, left inguinal nodes and establishment of axillo inguinal pathway, LUE working proximal to distal then retracing all steps while reviewing with pt  and pt practicing different portions                         PT Long Term Goals - 03/22/21 1213       PT LONG TERM GOAL #1   Title Pt/family memeber  will be independent with performance of MLD    Time 6    Period Weeks    Status Partially Met    Target Date 03/23/21      PT LONG TERM GOAL #2   Title Pt will be fit for flat knit compression sleeve for increased comfort    Baseline fit but not yet received    Time 6    Period Weeks    Status Partially Met      PT LONG TERM GOAL #3   Title Pt will be independent with donning compression garment with device prn    Time 6    Period Weeks    Status On-going    Target Date 03/23/21      PT LONG TERM GOAL #4   Title Pt will have improved swelling noted in left UE    Time 6    Period Weeks    Status On-going    Target Date 03/23/21                   Plan - 03/22/21 1207     Clinical Impression Statement Pt was late getting started secondary to check in problems up front.  pt was remeasured with good results.  She has been compliant with sleeve wear and MLD although not every day.  She is going to pick up her flat knit sleeve and tribute today so she will have on Friday to show [name].  We reviewed MLD today and pt seems to have a good understanding and is improving with her technique.  She may be able to decrease visits if her garments fit well and come 1x per week for the next few weeks to reassess prn. Advised  her to check on options to make donning easier for her when she picks up sleeve today.    Personal Factors and Comorbidities Age;Fitness;Comorbidity 2    Comorbidities neuropathy, myeloma,    Examination-Activity Limitations Carry;Dressing;Reach Overhead     Examination-Participation Restrictions Cleaning;Yard Work    Stability/Clinical Decision Making Stable/Uncomplicated    Rehab Potential Excellent    PT Frequency 2x / week    PT Duration 6 weeks    PT Treatment/Interventions ADLs/Self Care Home Management;Therapeutic exercise;Patient/family education;Manual techniques;Manual lymph drainage;Compression bandaging    PT Next Visit Plan Review self MLD, review donning new sleeve and tribute night, may decrease to 1x per week for several weeks    PT Home Exercise Plan sleeve, self MLD, Bandaging prn (Pt doesn't tolerate bandaging well    Recommended Other Services sleeve, tribute night    Consulted and Agree with Plan of Care Patient    Family Member Consulted daughter Nira Conn in attendance with pt today             Patient will benefit from skilled therapeutic intervention in order to improve the following deficits and impairments:  Increased edema, Postural dysfunction, Decreased knowledge of precautions  Visit Diagnosis: Acquired lymphedema  Aftercare following surgery for neoplasm  Malignant neoplasm of upper-outer quadrant of right breast in female, estrogen receptor positive ([place])  Abnormal posture     Problem List Patient Active Problem List   Diagnosis Date Noted   Goals of care, counseling/discussion 09/02/2020   Malignant neoplasm of upper-outer quadrant of left breast in female, estrogen receptor positive ([place]) 08/27/2020   Iron deficiency anemia 10/09/2019   Impaired mobility and activities of daily living 08/30/2018   Multiple myeloma ([place]) 08/30/2018   S/P spinal fusion 08/30/2018   Lumbar degenerative disc disease 08/27/2018   History of degenerative disc disease 08/15/2018   Neck pain 08/15/2018   Sensorineural hearing loss (SNHL), bilateral 08/15/2018   Tinnitus of both ears 08/15/2018   Smoldering multiple myeloma ([place]) 12/04/2017   Breakdown (mechanical) of implanted electronic neurostimulator, generator,  initial encounter ([place]) 08/12/2016   Myalgia 08/12/2016   Post laminectomy syndrome 08/12/2016   Migraine with aura and without status migrainosus, not intractable 10/15/2015   Intrinsic asthma 01/11/2010   Dyspnea 12/14/2009   DIABETES MELLITUS, TYPE II 12/11/2009   Hyperlipidemia 12/11/2009   MIGRAINE HEADACHE 12/11/2009   Hereditary and idiopathic peripheral neuropathy 12/11/2009   Hypertension 12/11/2009   Insomnia 12/11/2009   EDEMA 12/11/2009   Type 2 diabetes mellitus with diabetic polyneuropathy ([place]) 12/11/2009    Claris Pong 03/22/2021, 12:16 PM  [place], [place], [address] Phone: [phone]   Fax:  [phone]  Name: ZARIEL CAPANO MRN: [id number] Date of Birth: Nov 07, 1950  Cheral Almas, PT 03/22/21 12:18 PM

## 2021-03-26 ENCOUNTER — Encounter: Payer: BC Managed Care – PPO | Admitting: Physical Therapy

## 2021-04-02 ENCOUNTER — Encounter: Payer: BC Managed Care – PPO | Admitting: Physical Therapy

## 2021-04-09 ENCOUNTER — Ambulatory Visit: Payer: BC Managed Care – PPO

## 2021-04-09 ENCOUNTER — Other Ambulatory Visit: Payer: Self-pay

## 2021-04-09 DIAGNOSIS — I890 Lymphedema, not elsewhere classified: Secondary | ICD-10-CM | POA: Diagnosis not present

## 2021-04-09 DIAGNOSIS — R293 Abnormal posture: Secondary | ICD-10-CM

## 2021-04-09 DIAGNOSIS — C50411 Malignant neoplasm of upper-outer quadrant of right female breast: Secondary | ICD-10-CM

## 2021-04-09 DIAGNOSIS — Z483 Aftercare following surgery for neoplasm: Secondary | ICD-10-CM

## 2021-04-09 DIAGNOSIS — Z170 Estrogen receptor positive status [ER+]: Secondary | ICD-10-CM | POA: Diagnosis not present

## 2021-04-09 NOTE — Therapy (Signed)
[place], [place], [address] Phone: [phone]   Fax:  [phone]  Physical Therapy Treatment  Patient Details  Name: [name] MRN: [id number] Date of Birth: 10-24-1950 Referring Provider (PT): Dr. Marin Olp   Encounter Date: 04/09/2021   PT End of Session - 04/09/21 1204     Visit Number 9    Number of Visits 21    Date for PT Re-Evaluation 05/20/21    PT Start Time 1105    PT Stop Time 1155    PT Time Calculation (min) 50 min    Activity Tolerance Patient tolerated treatment well    Behavior During Therapy [facility] for tasks assessed/performed             Past Medical History:  Diagnosis Date   Allergies    Anxiety    Asthma    treated by pulmonologist    Atherosclerosis of aorta ([place])    noted on xray   Breast cancer ([place])    Right   Chronic pain    Depression    Diabetes ([place])    type II   Edema    Goals of care, counseling/discussion 09/02/2020   High cholesterol    History of nuclear stress test 11/12/2009   normal    Hypertension    IgG monoclonal gammopathy of uncertain significance    igG kappa monoclonal gammopathy of unkown significance (per notes from [employer]).   Insomnia    Low back pain    Migraine headache    Neuropathy associated with MGUS ([facility])    Peripheral neuropathy    PONV (postoperative nausea and vomiting)    hard to wake up   Sleep apnea    Smoldering multiple myeloma ([place]) 12/04/2017   Vertigo     Past Surgical History:  Procedure Laterality Date   BACK SURGERY  2019   T10-S1 Fusion Dr. Hulda Marin SURGERY  2019   2 fusion in lower back (L4-S1) and neck   breast cyst removal age 60     BREAST LUMPECTOMY WITH RADIOACTIVE SEED AND SENTINEL LYMPH NODE BIOPSY Left 09/30/2020   Procedure: RADIOACTIVE SEED GUIDED LEFT BREAST LUMPECTOMY, LEFT AXILLARY SENTINEL LYMPH NODE BIOPSY;  Surgeon: Jovita Kussmaul, MD;  Location: [facility];  Service: General;  Laterality: Left;   CESAREAN SECTION     x2   hernia repair at 54 months old     miscarriage D&C     NASAL SINUS SURGERY     x2   NECK SURGERY     pain stimulator in back     RE-EXCISION OF BREAST CANCER,SUPERIOR MARGINS Left 10/19/2020   Procedure: RE-EXCISION LEFT BREAST MARGINS;  Surgeon: Jovita Kussmaul, MD;  Location: [facility];  Service: General;  Laterality: Left;   TONSILLECTOMY      There were no vitals filed for this visit.   Subjective Assessment - 04/09/21 1107     Subjective The night time sleeve was in but she just got the custom sleeve yesterday. I tried on the night time garment and it fits good but I didn't wear it.  The daytime sleeve and glove doesn't fit.  Melissa said it was too short and she measured it again. I have been wearing the other sleeve during the day.  I think my arm is doing well.    Pertinent History Recent diagnosis of Lt breast cancer IDC stage IA with surgery 09/30/20  with 3 lymph nodes removed and then re-excision 10/19/20.  pt is followed by Dr. Marin Olp due to smoldering igG Kappa myeloma.  Other history includes: DM, Peripheral neuropathy, and HTN, cervical fusion with limited movement and back T10-S1, vertigo, recent fall with hand fracture    Patient Stated Goals decrease swelling/have comfortable sleeve    Currently in Pain? No/denies    Pain Score 0-No pain                [facility] PT Assessment - 04/09/21 0001       Assessment   Medical Diagnosis Left UE lymphedema    Referring Provider (PT) Dr. Marin Olp    Onset Date/Surgical Date 08/12/20    Hand Dominance Right               LYMPHEDEMA/ONCOLOGY QUESTIONNAIRE - 04/09/21 0001       Type   Cancer Type left breast CA/lymphedema      Surgeries   Lumpectomy Date 09/30/20    Other Surgery Date 10/19/20      Right Upper Extremity Lymphedema   15 cm Proximal to Olecranon Process 32.6 cm    10 cm Proximal to Olecranon Process 33.3  cm    Olecranon Process 31 cm    15 cm Proximal to Ulnar Styloid Process 25.2 cm    10 cm Proximal to Ulnar Styloid Process 21.4 cm    Just Proximal to Ulnar Styloid Process 18 cm    At Base of 2nd Digit 6.3 cm      Left Upper Extremity Lymphedema   15 cm Proximal to Olecranon Process 31.6 cm    10 cm Proximal to Olecranon Process 32.4 cm    Olecranon Process 30.3 cm    15 cm Proximal to Ulnar Styloid Process 25.5 cm    10 cm Proximal to Ulnar Styloid Process 22.4 cm    Just Proximal to Ulnar Styloid Process 17.5 cm    At Base of 2nd Digit 6.3 cm                        [facility] Adult PT Treatment/Exercise - 04/09/21 0001       Manual Therapy   Manual Therapy Manual Lymphatic Drainage (MLD)    Edema Management pt remeasured    Manual Lymphatic Drainage (MLD) in supine with head of bed elevated: short neck, 5 diaphragmatic breaths, right axillary nodes and establishment of interaxillary pathway, left inguinal nodes and establishment of axillo inguinal pathway, LUE working proximal to distal then retracing all steps while instructing pt husband in principles of MLD                         PT Long Term Goals - 04/09/21 1216       PT LONG TERM GOAL #1   Title Pt/family memeber  will be independent with performance of MLD    Time 6    Period Weeks    Status Partially Met      PT LONG TERM GOAL #2   Baseline measured for custom but didn't fit and was remeasured last week    Time 6    Period Weeks    Status On-going      PT LONG TERM GOAL #3   Title Pt will be independent with donning compression garment with device prn    Time 6    Period Weeks    Status On-going  PT LONG TERM GOAL #4   Title Pt will have improved swelling noted in left UE    Time 6    Period Weeks    Status Achieved                   Plan - 04/09/21 1204     Clinical Impression Statement Pt was going to pick up  her tribute however, her daytime sleeve was not  in.  When she went to get her daytime sleeve this week it did not fit properly and Melissa remeasured her and re-ordered it for her.  She thinks it will be in ina week or 2.  Pt was remeasured today with good results overall.  10 CM prox to ulna was slightly increased but all other measurements were good. We continued MLD and pts. husband was verbally  instructed in how MLD works and answered pt questions. Pt will reschedule for next week or 2 to get her new garments and come in to have them checked.    Personal Factors and Comorbidities Age;Fitness;Comorbidity 2    Comorbidities neuropathy, myeloma,    Examination-Activity Limitations Carry;Dressing;Reach Overhead    Examination-Participation Restrictions Cleaning;Yard Work    Stability/Clinical Decision Making Stable/Uncomplicated    Rehab Potential Excellent    PT Frequency 2x / week    PT Duration 6 weeks    PT Treatment/Interventions ADLs/Self Care Home Management;Therapeutic exercise;Patient/family education;Manual techniques;Manual lymph drainage;Compression bandaging    PT Next Visit Plan Review self MLD, review donning new sleeve and tribute night, may decrease to 1x per week for several weeks    PT Home Exercise Plan sleeve, self MLD, Bandaging prn (Pt doesn't tolerate bandaging well, trib    Consulted and Agree with Plan of Care Patient    Family Member Consulted pts husband in attendance             Patient will benefit from skilled therapeutic intervention in order to improve the following deficits and impairments:  Increased edema, Postural dysfunction, Decreased knowledge of precautions  Visit Diagnosis: Acquired lymphedema  Aftercare following surgery for neoplasm  Malignant neoplasm of upper-outer quadrant of right breast in female, estrogen receptor positive ([place])  Abnormal posture     Problem List Patient Active Problem List   Diagnosis Date Noted   Goals of care, counseling/discussion 09/02/2020   Malignant  neoplasm of upper-outer quadrant of left breast in female, estrogen receptor positive ([place] [place]) 08/27/2020   Iron deficiency anemia 10/09/2019   Impaired mobility and activities of daily living 08/30/2018   Multiple myeloma ([place]) 08/30/2018   S/P spinal fusion 08/30/2018   Lumbar degenerative disc disease 08/27/2018   History of degenerative disc disease 08/15/2018   Neck pain 08/15/2018   Sensorineural hearing loss (SNHL), bilateral 08/15/2018   Tinnitus of both ears 08/15/2018   Smoldering multiple myeloma ([place]) 12/04/2017   Breakdown (mechanical) of implanted electronic neurostimulator, generator, initial encounter ([place]) 08/12/2016   Myalgia 08/12/2016   Post laminectomy syndrome 08/12/2016   Migraine with aura and without status migrainosus, not intractable 10/15/2015   Intrinsic asthma 01/11/2010   Dyspnea 12/14/2009   DIABETES MELLITUS, TYPE II 12/11/2009   Hyperlipidemia 12/11/2009   MIGRAINE HEADACHE 12/11/2009   Hereditary and idiopathic peripheral neuropathy 12/11/2009   Hypertension 12/11/2009   Insomnia 12/11/2009   EDEMA 12/11/2009   Type 2 diabetes mellitus with diabetic polyneuropathy ([place]) 12/11/2009    Elsie Ra Cranford Blessinger 04/09/2021, 12:19 PM  [facility]  [place], [place], [address] Phone: [phone]   Fax:  [phone]  Name: [name] MRN: [id number] Date of Birth: [birth year]  Cheral Almas, PT 04/09/21 12:22 PM

## 2021-04-16 DIAGNOSIS — F325 Major depressive disorder, single episode, in full remission: Secondary | ICD-10-CM | POA: Diagnosis not present

## 2021-04-16 DIAGNOSIS — E1140 Type 2 diabetes mellitus with diabetic neuropathy, unspecified: Secondary | ICD-10-CM | POA: Diagnosis not present

## 2021-04-16 DIAGNOSIS — E782 Mixed hyperlipidemia: Secondary | ICD-10-CM | POA: Diagnosis not present

## 2021-04-16 DIAGNOSIS — Z7984 Long term (current) use of oral hypoglycemic drugs: Secondary | ICD-10-CM | POA: Diagnosis not present

## 2021-04-16 DIAGNOSIS — I10 Essential (primary) hypertension: Secondary | ICD-10-CM | POA: Diagnosis not present

## 2021-04-21 ENCOUNTER — Ambulatory Visit: Payer: BC Managed Care – PPO

## 2021-05-04 ENCOUNTER — Ambulatory Visit (HOSPITAL_BASED_OUTPATIENT_CLINIC_OR_DEPARTMENT_OTHER): Payer: BC Managed Care – PPO | Admitting: Obstetrics & Gynecology

## 2021-05-05 ENCOUNTER — Ambulatory Visit (HOSPITAL_BASED_OUTPATIENT_CLINIC_OR_DEPARTMENT_OTHER): Payer: BC Managed Care – PPO | Admitting: Obstetrics & Gynecology

## 2021-05-07 DIAGNOSIS — Z170 Estrogen receptor positive status [ER+]: Secondary | ICD-10-CM | POA: Diagnosis not present

## 2021-05-07 DIAGNOSIS — C50412 Malignant neoplasm of upper-outer quadrant of left female breast: Secondary | ICD-10-CM | POA: Diagnosis not present

## 2021-05-12 DIAGNOSIS — G894 Chronic pain syndrome: Secondary | ICD-10-CM | POA: Diagnosis not present

## 2021-05-12 DIAGNOSIS — M961 Postlaminectomy syndrome, not elsewhere classified: Secondary | ICD-10-CM | POA: Diagnosis not present

## 2021-05-12 DIAGNOSIS — Z79891 Long term (current) use of opiate analgesic: Secondary | ICD-10-CM | POA: Diagnosis not present

## 2021-05-12 DIAGNOSIS — E1142 Type 2 diabetes mellitus with diabetic polyneuropathy: Secondary | ICD-10-CM | POA: Diagnosis not present

## 2021-05-20 ENCOUNTER — Encounter: Payer: Self-pay | Admitting: Hematology & Oncology

## 2021-05-20 ENCOUNTER — Other Ambulatory Visit: Payer: Self-pay

## 2021-05-20 ENCOUNTER — Inpatient Hospital Stay: Payer: BC Managed Care – PPO | Attending: Hematology & Oncology

## 2021-05-20 ENCOUNTER — Encounter: Payer: Self-pay | Admitting: *Deleted

## 2021-05-20 ENCOUNTER — Telehealth: Payer: Self-pay

## 2021-05-20 ENCOUNTER — Inpatient Hospital Stay (HOSPITAL_BASED_OUTPATIENT_CLINIC_OR_DEPARTMENT_OTHER): Payer: BC Managed Care – PPO | Admitting: Hematology & Oncology

## 2021-05-20 VITALS — BP 168/80 | HR 96 | Temp 98.2°F | Resp 20 | Wt 204.1 lb

## 2021-05-20 DIAGNOSIS — M7989 Other specified soft tissue disorders: Secondary | ICD-10-CM | POA: Insufficient documentation

## 2021-05-20 DIAGNOSIS — D472 Monoclonal gammopathy: Secondary | ICD-10-CM | POA: Diagnosis not present

## 2021-05-20 DIAGNOSIS — C50912 Malignant neoplasm of unspecified site of left female breast: Secondary | ICD-10-CM | POA: Insufficient documentation

## 2021-05-20 DIAGNOSIS — K5900 Constipation, unspecified: Secondary | ICD-10-CM | POA: Insufficient documentation

## 2021-05-20 DIAGNOSIS — D510 Vitamin B12 deficiency anemia due to intrinsic factor deficiency: Secondary | ICD-10-CM | POA: Insufficient documentation

## 2021-05-20 DIAGNOSIS — M2550 Pain in unspecified joint: Secondary | ICD-10-CM | POA: Diagnosis not present

## 2021-05-20 DIAGNOSIS — E1140 Type 2 diabetes mellitus with diabetic neuropathy, unspecified: Secondary | ICD-10-CM | POA: Insufficient documentation

## 2021-05-20 DIAGNOSIS — R12 Heartburn: Secondary | ICD-10-CM | POA: Diagnosis not present

## 2021-05-20 DIAGNOSIS — Z923 Personal history of irradiation: Secondary | ICD-10-CM | POA: Diagnosis not present

## 2021-05-20 DIAGNOSIS — Z79811 Long term (current) use of aromatase inhibitors: Secondary | ICD-10-CM | POA: Insufficient documentation

## 2021-05-20 DIAGNOSIS — Z170 Estrogen receptor positive status [ER+]: Secondary | ICD-10-CM | POA: Diagnosis not present

## 2021-05-20 DIAGNOSIS — C50412 Malignant neoplasm of upper-outer quadrant of left female breast: Secondary | ICD-10-CM | POA: Diagnosis present

## 2021-05-20 DIAGNOSIS — C9000 Multiple myeloma not having achieved remission: Secondary | ICD-10-CM

## 2021-05-20 DIAGNOSIS — I890 Lymphedema, not elsewhere classified: Secondary | ICD-10-CM | POA: Diagnosis not present

## 2021-05-20 DIAGNOSIS — M791 Myalgia, unspecified site: Secondary | ICD-10-CM | POA: Diagnosis not present

## 2021-05-20 DIAGNOSIS — Z79899 Other long term (current) drug therapy: Secondary | ICD-10-CM | POA: Insufficient documentation

## 2021-05-20 DIAGNOSIS — R002 Palpitations: Secondary | ICD-10-CM | POA: Diagnosis not present

## 2021-05-20 DIAGNOSIS — R0602 Shortness of breath: Secondary | ICD-10-CM | POA: Insufficient documentation

## 2021-05-20 HISTORY — DX: Vitamin B12 deficiency anemia due to intrinsic factor deficiency: D51.0

## 2021-05-20 LAB — CBC WITH DIFFERENTIAL (CANCER CENTER ONLY)
Abs Immature Granulocytes: 0.04 10*3/uL (ref 0.00–0.07)
Basophils Absolute: 0.1 10*3/uL (ref 0.0–0.1)
Basophils Relative: 1 %
Eosinophils Absolute: 0.5 10*3/uL (ref 0.0–0.5)
Eosinophils Relative: 5 %
HCT: 39.8 % (ref 36.0–46.0)
Hemoglobin: 12.9 g/dL (ref 12.0–15.0)
Immature Granulocytes: 0 %
Lymphocytes Relative: 36 %
Lymphs Abs: 3.5 10*3/uL (ref 0.7–4.0)
MCH: 29.1 pg (ref 26.0–34.0)
MCHC: 32.4 g/dL (ref 30.0–36.0)
MCV: 89.6 fL (ref 80.0–100.0)
Monocytes Absolute: 0.9 10*3/uL (ref 0.1–1.0)
Monocytes Relative: 9 %
Neutro Abs: 4.8 10*3/uL (ref 1.7–7.7)
Neutrophils Relative %: 49 %
Platelet Count: 319 10*3/uL (ref 150–400)
RBC: 4.44 MIL/uL (ref 3.87–5.11)
RDW: 12.4 % (ref 11.5–15.5)
WBC Count: 9.7 10*3/uL (ref 4.0–10.5)
nRBC: 0 % (ref 0.0–0.2)

## 2021-05-20 LAB — CMP (CANCER CENTER ONLY)
ALT: 17 U/L (ref 0–44)
AST: 19 U/L (ref 15–41)
Albumin: 4.3 g/dL (ref 3.5–5.0)
Alkaline Phosphatase: 52 U/L (ref 38–126)
Anion gap: 7 (ref 5–15)
BUN: 20 mg/dL (ref 8–23)
CO2: 31 mmol/L (ref 22–32)
Calcium: 10 mg/dL (ref 8.9–10.3)
Chloride: 99 mmol/L (ref 98–111)
Creatinine: 0.73 mg/dL (ref 0.44–1.00)
GFR, Estimated: >60 mL/min (ref >60)
Glucose, Bld: 161 mg/dL — ABNORMAL HIGH (ref 70–99)
Potassium: 3.6 mmol/L (ref 3.5–5.1)
Sodium: 137 mmol/L (ref 135–145)
Total Bilirubin: 0.6 mg/dL (ref 0.3–1.2)
Total Protein: 7.5 g/dL (ref 6.5–8.1)

## 2021-05-20 LAB — VITAMIN B12: Vitamin B-12: 159 pg/mL — ABNORMAL LOW (ref 180–914)

## 2021-05-20 LAB — LACTATE DEHYDROGENASE: LDH: 163 U/L (ref 98–192)

## 2021-05-20 NOTE — Addendum Note (Signed)
Addended by: Burney Gauze R on: 05/20/2021 05:31 PM   Modules accepted: Orders

## 2021-05-20 NOTE — Progress Notes (Signed)
Hematology and Oncology Follow Up Visit  [name] [id number] 1951/05/13 70 y.o. 05/20/2021   Principle Diagnosis:  1. Stage IA (T1cN0M0) infiltrating ductal carcinoma of the LEFT breast -- ER+/PR+/HER2- -- Oncotype = 16 2. IgG Kappa smoldering myeloma 3. Chronic neuropathy 4. Pernicious Anemia  Current Therapy:   Velcade q week dosing (3/1) -- s/p cycle #2 -- d/c on 02/06/2018 for lack of effectiveness Vit B12 1000 mcg IM q month - start on 03/25/2020 S/p XRT to the LEFT breast -- completed on 01/01/2021 Femara 2.5 mg po q day -- start on 01/19/2021  Interim History:  [name] is here today for follow-up.  She does have the lymphedema in her left arm.  She has a sleeve on the arm.  She is going to go to have this looked at today.  I think she is going to have a knee sleeve put on.  She otherwise, seems to be doing okay.  She does have the neuropathy.  This is chronic for her.  She does have the IgG kappa MGUS/smoldering myeloma.  When we last saw her, her M spike was 1 g/dL.  The IgG level was 1470 mg/dL.  The Kappa light chain was 3.4 mg/dL.  She has had no change in bowel or bladder habits.  Thankfully, I would think she has fallen over the summertime.  Her husband comes in with her.  This is really the first time that I am seeing him for quite a long time.  She has had no problems with COVID.  There is no cough.  She has had no rashes.  There is been no leg swelling.  She is on Femara.  I think she is doing pretty well with the Femara.  There is no issues with arthropathy.  Overall, her performance status is ECOG 1.    Medications:  Allergies as of 05/20/2021       Reactions   Dristan [pheniramine-phenylephrine] Palpitations   Other Palpitations   Contact "old cold medicine        Medication List        Accurate as of May 20, 2021 12:05 PM. If you have any questions, ask your nurse or doctor.          STOP taking these medications     morphine 60 MG 24 hr capsule Commonly known as: AVINZA Stopped by: [name], MD       TAKE these medications    Accu-Chek Softclix Lancets lancets   albuterol 108 (90 Base) MCG/ACT inhaler Commonly known as: VENTOLIN HFA Inhale into the lungs every 6 (six) hours as needed for wheezing or shortness of breath.   amitriptyline 10 MG tablet Commonly known as: ELAVIL Take 10 mg by mouth at bedtime.   cyanocobalamin 1000 MCG/ML injection Commonly known as: (VITAMIN B-12) Inject 1000 mcg (1 mL) into the muscle every 30 days for 6 months.   cyclobenzaprine 5 MG tablet Commonly known as: FLEXERIL Take 5 mg by mouth 3 (three) times daily as needed.   ergocalciferol 1.25 MG (50000 UT) capsule Commonly known as: VITAMIN D2 Take 1 capsule (50,000 Units total) by mouth once a week.   FreeStyle [employer] [place] [employer] Kerrin Mo See admin instructions.   HYDROcodone-acetaminophen 10-325 MG tablet Commonly known as: NORCO   letrozole 2.5 MG tablet Commonly known as: FEMARA Take 1 tablet (2.5 mg total) by mouth daily.   metFORMIN 500 MG 24 hr tablet Commonly  known as: GLUCOPHAGE-XR Take 1,000 mg by mouth 2 (two) times daily.   metoprolol succinate 100 MG 24 hr tablet Commonly known as: TOPROL-XL Take 100 mg by mouth daily.   morphine 60 MG 12 hr tablet Commonly known as: MS CONTIN Take 60 mg by mouth every 12 (twelve) hours.   pravastatin 40 MG tablet Commonly known as: PRAVACHOL Take 40 mg by mouth daily.   venlafaxine XR 150 MG 24 hr capsule Commonly known as: EFFEXOR-XR Take 150 mg by mouth daily with breakfast.        Allergies:  Allergies  Allergen Reactions   Dristan [Pheniramine-Phenylephrine] Palpitations   Other Palpitations    Contact "old cold medicine    Past Medical History, Surgical history, Social history, and Family History were reviewed and updated.  Review of Systems: Review of Systems  Constitutional:   Positive for malaise/fatigue.  HENT: Negative.    Eyes: Negative.   Respiratory:  Positive for shortness of breath.   Cardiovascular:  Positive for palpitations and leg swelling.  Gastrointestinal:  Positive for constipation and heartburn.  Genitourinary: Negative.   Musculoskeletal:  Positive for joint pain and myalgias.  Skin: Negative.   Neurological:  Positive for tingling.  Endo/Heme/Allergies: Negative.   Psychiatric/Behavioral: Negative.      Physical Exam:  weight is 204 lb 1.9 oz (92.6 kg). Her oral temperature is 98.2 F (36.8 C). Her blood pressure is 168/80 (abnormal) and her pulse is 96. Her respiration is 20 and oxygen saturation is 95%.   Wt Readings from Last 3 Encounters:  05/20/21 204 lb 1.9 oz (92.6 kg)  02/17/21 199 lb 1.9 oz (90.3 kg)  12/30/20 201 lb (91.2 kg)  Is 1 weight 1 1% patient is no  Physical Exam Vitals reviewed.  Constitutional:      Comments: On her breast exam, the right breast shows the biopsy site to be well-healed.  There is no distinct mass in the right breast.  There is no right axillary adenopathy.  Left breast shows the lumpectomy scar at the 1 o'clock position.  She has some slight erythema on the left breast from radiation.  There is no actual skin breakdown.  This is at the edge of the areola.  She has a well healing lymphadenectomy scar in the left axilla.  There is no axillary adenopathy in the left axilla.  HENT:     Head: Normocephalic and atraumatic.  Eyes:     Pupils: Pupils are equal, round, and reactive to light.  Cardiovascular:     Rate and Rhythm: Normal rate and regular rhythm.     Heart sounds: Normal heart sounds.  Pulmonary:     Effort: Pulmonary effort is normal.     Breath sounds: Normal breath sounds.  Abdominal:     General: Bowel sounds are normal.     Palpations: Abdomen is soft.  Musculoskeletal:        General: No tenderness or deformity. Normal range of motion.     Cervical back: Normal range of motion.      Comments: On her left arm, she has a compression sleeve.  There is some edema.  She has decent range of motion of her joints.    Lymphadenopathy:     Cervical: No cervical adenopathy.  Skin:    General: Skin is warm and dry.     Findings: No erythema or rash.  Neurological:     Mental Status: She is alert and oriented to person, place, and time.  Psychiatric:        Behavior: Behavior normal.        Thought Content: Thought content normal.        Judgment: Judgment normal.    Lab Results  Component Value Date   WBC 9.7 05/20/2021   HGB 12.9 05/20/2021   HCT 39.8 05/20/2021   MCV 89.6 05/20/2021   PLT 319 05/20/2021   Lab Results  Component Value Date   FERRITIN 55 10/03/2019   IRON 68 10/03/2019   TIBC 389 10/03/2019   UIBC 321 10/03/2019   IRONPCTSAT 17 (L) 10/03/2019   Lab Results  Component Value Date   RBC 4.44 05/20/2021   Lab Results  Component Value Date   KPAFRELGTCHN 33.9 (H) 02/17/2021   LAMBDASER 24.4 02/17/2021   KAPLAMBRATIO 1.39 02/17/2021   Lab Results  Component Value Date   IGGSERUM 1,476 02/17/2021   IGGSERUM 1,465 02/17/2021   IGA 64 (L) 02/17/2021   IGA 68 (L) 02/17/2021   IGMSERUM 44 02/17/2021   IGMSERUM 48 02/17/2021   Lab Results  Component Value Date   TOTALPROTELP 7.6 02/17/2021   ALBUMINELP 3.8 02/17/2021   A1GS 0.2 02/17/2021   A2GS 0.9 02/17/2021   BETS 1.2 02/17/2021   BETA2SER 0.3 02/20/2015   GAMS 1.4 02/17/2021   MSPIKE 1.0 (H) 02/17/2021   SPEI * 02/20/2015     Chemistry      Component Value Date/Time   NA 137 05/20/2021 1135   NA 139 03/23/2020 1220   NA 140 03/27/2017 0804   K 3.6 05/20/2021 1135   K 3.7 03/27/2017 0804   CL 99 05/20/2021 1135   CL 94 (L) 02/20/2015 1010   CO2 31 05/20/2021 1135   CO2 28 03/27/2017 0804   BUN 20 05/20/2021 1135   BUN 18 03/23/2020 1220   BUN 14.9 03/27/2017 0804   CREATININE 0.73 05/20/2021 1135   CREATININE 0.9 03/27/2017 0804      Component Value Date/Time    CALCIUM 10.0 05/20/2021 1135   CALCIUM 10.0 03/27/2017 0804   ALKPHOS 52 05/20/2021 1135   ALKPHOS 69 03/27/2017 0804   AST 19 05/20/2021 1135   AST 26 03/27/2017 0804   ALT 17 05/20/2021 1135   ALT 28 03/27/2017 0804   BILITOT 0.6 05/20/2021 1135   BILITOT 0.76 03/27/2017 0804     Impression and Plan: [name] is 70 yo postmenopausal white female with smoldering myeloma and also chronic neuropathy due to diabetes and chronic back issues.   She has a stage Ia ductal carcinoma of the left breast.  She underwent a lumpectomy.  She has a low Oncotype score.    She is doing well on the Femara.  She already has hot flashes and sweats.  The Femara may be making these a bit more prominent.  I just do not think that breast cancer recurrence again be a real issue for her.  Hopefully, the lymphedema in the left arm will improve a little bit.  We will plan to get her back in another 3 months.  I think this would be reasonable.     [name], MD 9/8/202212:05 PM

## 2021-05-20 NOTE — Progress Notes (Signed)
Oncology Nurse Navigator Documentation  Oncology Nurse Navigator Flowsheets 05/20/2021  Abnormal Finding Date -  Confirmed Diagnosis Date -  Diagnosis Status -  Planned Course of Treatment -  Phase of Treatment -  Aromatase Inhibitor Actual Start Date: -  Radiation Actual Start Date: -  Radiation Expected End Date: -  Radiation Actual End Date: -  Expected Surgery Date -  Surgery Actual Start Date: -  Navigator Follow Up Date: -  Navigator Follow Up Reason: -  Navigation Complete Date: 05/20/2021  Post Navigation: Continue to Follow Patient? No  Reason Not Navigating Patient: Patient On Maintenance Chemotherapy  Navigator Location [facility]  Referral Date to RadOnc/MedOnc -  Navigator Encounter Type Appt/Treatment Plan Review  Telephone -  Treatment Initiated Date -  Patient Visit Type MedOnc  Treatment Phase Active Tx  Barriers/Navigation Needs No Barriers At This Time  Education -  Interventions None Required  Acuity Level 1-No Barriers  Referrals -  Coordination of Care -  Education Method -  Support Groups/Services Friends and Family  Time Spent with Patient 15

## 2021-05-20 NOTE — Telephone Encounter (Signed)
Appts made and printed for pt per 05/20/21 los  Liana Camerer 

## 2021-05-21 ENCOUNTER — Telehealth: Payer: Self-pay

## 2021-05-21 LAB — KAPPA/LAMBDA LIGHT CHAINS
Kappa free light chain: 41.9 mg/L — ABNORMAL HIGH (ref 3.3–19.4)
Kappa, lambda light chain ratio: 1.33 (ref 0.26–1.65)
Lambda free light chains: 31.6 mg/L — ABNORMAL HIGH (ref 5.7–26.3)

## 2021-05-21 LAB — IGG, IGA, IGM
IgA: 67 mg/dL — ABNORMAL LOW (ref 87–352)
IgG (Immunoglobin G), Serum: 1458 mg/dL (ref 586–1602)
IgM (Immunoglobulin M), Srm: 48 mg/dL (ref 26–217)

## 2021-05-21 NOTE — Telephone Encounter (Addendum)
Left message on VM notifying pt that B12 is low and to contact our office for more instructions on how he would like for her to be scheduled. Message forwarded to scheduling. dph  ----- Message from [name], MD sent at 05/20/2021  5:24 PM EDT ----- Please call her and tell her that the vitamin B12 level is quite low.  It is only 159.  We really need to give her vitamin B 12 injections.  She needs this daily for 5 days and then weekly for 4 weeks and then once a month.  Oral vitamin B12 is not working for her.  Please set this up.  Laurey Arrow

## 2021-05-24 ENCOUNTER — Ambulatory Visit: Payer: BC Managed Care – PPO

## 2021-05-24 ENCOUNTER — Telehealth: Payer: Self-pay

## 2021-05-24 ENCOUNTER — Other Ambulatory Visit: Payer: Self-pay

## 2021-05-24 DIAGNOSIS — D510 Vitamin B12 deficiency anemia due to intrinsic factor deficiency: Secondary | ICD-10-CM

## 2021-05-24 MED ORDER — CYANOCOBALAMIN 1000 MCG/ML IJ SOLN: 1000.0000 ug | INTRAMUSCULAR | 0 refills | Status: DC

## 2021-05-24 NOTE — Telephone Encounter (Signed)
-----   Message from Johny Drilling, RN sent at 05/21/2021  3:13 PM EDT ----- Sending this to everybody since AM is off today and wasn't sure when it would get picked up!  I left a message with the info to contact office to schedule and if she had questions for nursing! Thank you!   No PA per Ross Ludwig. dph ----- Message ----- From: [name], MD Sent: 05/20/2021   5:24 PM EDT To: Onc Nurse Hp  Please call her and tell her that the vitamin B12 level is quite low.  It is only 159.  We really need to give her vitamin B 12 injections.  She needs this daily for 5 days and then weekly for 4 weeks and then once a month.  Oral vitamin B12 is not working for her.  Please set this up.  [name]

## 2021-05-24 NOTE — Progress Notes (Signed)
Patient reqeusted vitamin b12 injections to be done at home. Verbal order received from sarah carter,NP. Patient to do 1 injection a week for 4 weeks and then go back to doing 1 injection a month. Pt aware of rx sent to pharmacy.

## 2021-05-26 LAB — PROTEIN ELECTROPHORESIS, SERUM, WITH REFLEX
A/G Ratio: 1.1 (ref 0.7–1.7)
Albumin ELP: 3.7 g/dL (ref 2.9–4.4)
Alpha-1-Globulin: 0.2 g/dL (ref 0.0–0.4)
Alpha-2-Globulin: 0.8 g/dL (ref 0.4–1.0)
Beta Globulin: 1.1 g/dL (ref 0.7–1.3)
Gamma Globulin: 1.4 g/dL (ref 0.4–1.8)
Globulin, Total: 3.5 g/dL (ref 2.2–3.9)
M-Spike, %: 0.9 g/dL — ABNORMAL HIGH
SPEP Interpretation: 0
Total Protein ELP: 7.2 g/dL (ref 6.0–8.5)

## 2021-05-26 LAB — IMMUNOFIXATION REFLEX, SERUM
IgA: 66 mg/dL — ABNORMAL LOW (ref 87–352)
IgG (Immunoglobin G), Serum: 1709 mg/dL — ABNORMAL HIGH (ref 586–1602)
IgM (Immunoglobulin M), Srm: 46 mg/dL (ref 26–217)

## 2021-06-01 ENCOUNTER — Ambulatory Visit: Payer: BC Managed Care – PPO

## 2021-06-14 ENCOUNTER — Ambulatory Visit: Payer: BC Managed Care – PPO | Admitting: Podiatry

## 2021-06-14 ENCOUNTER — Encounter: Payer: Self-pay | Admitting: Family

## 2021-06-16 ENCOUNTER — Ambulatory Visit: Payer: BC Managed Care – PPO

## 2021-06-21 ENCOUNTER — Other Ambulatory Visit: Payer: Self-pay | Admitting: Family

## 2021-06-21 DIAGNOSIS — D510 Vitamin B12 deficiency anemia due to intrinsic factor deficiency: Secondary | ICD-10-CM

## 2021-07-05 ENCOUNTER — Ambulatory Visit: Payer: BC Managed Care – PPO

## 2021-07-07 ENCOUNTER — Ambulatory Visit (HOSPITAL_BASED_OUTPATIENT_CLINIC_OR_DEPARTMENT_OTHER): Payer: BC Managed Care – PPO | Admitting: Obstetrics & Gynecology

## 2021-07-07 DIAGNOSIS — G894 Chronic pain syndrome: Secondary | ICD-10-CM | POA: Diagnosis not present

## 2021-07-07 DIAGNOSIS — Z79891 Long term (current) use of opiate analgesic: Secondary | ICD-10-CM | POA: Diagnosis not present

## 2021-07-07 DIAGNOSIS — E1142 Type 2 diabetes mellitus with diabetic polyneuropathy: Secondary | ICD-10-CM | POA: Diagnosis not present

## 2021-07-07 DIAGNOSIS — M961 Postlaminectomy syndrome, not elsewhere classified: Secondary | ICD-10-CM | POA: Diagnosis not present

## 2021-07-20 DIAGNOSIS — R42 Dizziness and giddiness: Secondary | ICD-10-CM | POA: Diagnosis not present

## 2021-07-20 DIAGNOSIS — G629 Polyneuropathy, unspecified: Secondary | ICD-10-CM | POA: Diagnosis not present

## 2021-07-20 DIAGNOSIS — G43909 Migraine, unspecified, not intractable, without status migrainosus: Secondary | ICD-10-CM | POA: Diagnosis not present

## 2021-07-20 DIAGNOSIS — H903 Sensorineural hearing loss, bilateral: Secondary | ICD-10-CM | POA: Diagnosis not present

## 2021-07-26 ENCOUNTER — Ambulatory Visit: Payer: BC Managed Care – PPO

## 2021-08-09 ENCOUNTER — Other Ambulatory Visit: Payer: Self-pay

## 2021-08-09 ENCOUNTER — Ambulatory Visit: Payer: BC Managed Care – PPO | Attending: General Surgery

## 2021-08-09 VITALS — Wt 200.1 lb

## 2021-08-09 DIAGNOSIS — Z483 Aftercare following surgery for neoplasm: Secondary | ICD-10-CM | POA: Insufficient documentation

## 2021-08-09 NOTE — Therapy (Signed)
[place] @ [place], [place], [address] Phone: [phone]   Fax:  [phone]  Physical Therapy Treatment  Patient Details  Name: [name] MRN: [id number] Date of Birth: 04/25/1951 Referring Provider (PT): Dr. Marin Olp   Encounter Date: 08/09/2021   PT End of Session - 08/09/21 1634     Visit Number 9   # unchanged due to screen only   PT Start Time 1025    PT Stop Time 1641    PT Time Calculation (min) 10 min    Activity Tolerance Patient tolerated treatment well    Behavior During Therapy [facility] for tasks assessed/performed             Past Medical History:  Diagnosis Date   Allergies    Anxiety    Asthma    treated by pulmonologist    Atherosclerosis of aorta ([place])    noted on xray   Breast cancer ([place])    Right   Chronic pain    Depression    Diabetes ([place])    type II   Edema    Goals of care, counseling/discussion 09/02/2020   High cholesterol    History of nuclear stress test 11/12/2009   normal    Hypertension    IgG monoclonal gammopathy of uncertain significance    igG kappa monoclonal gammopathy of unkown significance (per notes from [employer]).   Insomnia    Low back pain    Migraine headache    Neuropathy associated with MGUS ([facility])    Peripheral neuropathy    Pernicious anemia 05/20/2021   PONV (postoperative nausea and vomiting)    hard to wake up   Sleep apnea    Smoldering multiple myeloma ([place]) 12/04/2017   Vertigo     Past Surgical History:  Procedure Laterality Date   BACK SURGERY  2019   T10-S1 Fusion Dr. Hulda Marin SURGERY  2019   2 fusion in lower back (L4-S1) and neck   breast cyst removal age 20     BREAST LUMPECTOMY WITH RADIOACTIVE SEED AND SENTINEL LYMPH NODE BIOPSY Left 09/30/2020   Procedure: RADIOACTIVE SEED GUIDED LEFT BREAST LUMPECTOMY, LEFT AXILLARY SENTINEL LYMPH NODE BIOPSY;  Surgeon: Jovita Kussmaul, MD;  Location: [place];  Service: General;  Laterality: Left;   CESAREAN SECTION     x2   hernia repair at 24 months old     miscarriage D&C     NASAL SINUS SURGERY     x2   NECK SURGERY     pain stimulator in back     RE-EXCISION OF BREAST CANCER,SUPERIOR MARGINS Left 10/19/2020   Procedure: RE-EXCISION LEFT BREAST MARGINS;  Surgeon: Jovita Kussmaul, MD;  Location: [place];  Service: General;  Laterality: Left;   TONSILLECTOMY      Vitals:   08/09/21 1633  Weight: 200 lb 2 oz (90.8 kg)     Subjective Assessment - 08/09/21 1632     Subjective Pt returns for her 3 month L-Dex screen."I want you to check my compression glove, I have had trouble with getting my garments to fit right."    Pertinent History Recent diagnosis of Lt breast cancer IDC stage IA with surgery 09/30/20 with 3 lymph nodes removed and then re-excision 10/19/20.  pt is followed by Dr. Marin Olp due to smoldering igG Kappa myeloma.  Other history includes: DM, Peripheral neuropathy, and HTN, cervical fusion with limited  movement and back T10-S1, vertigo, recent fall with hand fracture                    L-DEX FLOWSHEETS - 08/09/21 1600       L-DEX LYMPHEDEMA SCREENING   Measurement Type Unilateral    L-DEX MEASUREMENT EXTREMITY Upper Extremity    POSITION  Standing    DOMINANT SIDE Right    At Risk Side Left    BASELINE SCORE (UNILATERAL) -2.6    L-DEX SCORE (UNILATERAL) -1.5    VALUE CHANGE (UNILAT) 1.1                                     PT Long Term Goals - 04/09/21 1216       PT LONG TERM GOAL #1   Title Pt/family memeber  will be independent with performance of MLD    Time 6    Period Weeks    Status Partially Met      PT LONG TERM GOAL #2   Baseline measured for custom but didn't fit and was remeasured last week    Time 6    Period Weeks    Status On-going      PT LONG TERM GOAL #3   Title Pt will be independent with donning compression garment with  device prn    Time 6    Period Weeks    Status On-going      PT LONG TERM GOAL #4   Title Pt will have improved swelling noted in left UE    Time 6    Period Weeks    Status Achieved                   Plan - 08/09/21 1642     Clinical Impression Statement Pt returns for her 3 month L-Dex screen. Her change from baseline of 1.1 is WNLs and much improved from her last screen of 12.3 so she no linger needs to wear her compression every day but does plan to wear them a few days a week. Pt was very pleased with her progress, despite stillhaving trouble with the fitter ([employer]) getting her garments to fit right. Advised her that she does not need a glove at this time so she will return this as it doesn't fit right anyways (is too tight). She will cont every 3 month L-Dex screens.    PT Next Visit Plan Cont every 3 month L-Dex screens for up to 2 years from her SLNB (~09/30/22)    Consulted and Agree with Plan of Care Patient             Patient will benefit from skilled therapeutic intervention in order to improve the following deficits and impairments:     Visit Diagnosis: Aftercare following surgery for neoplasm     Problem List Patient Active Problem List   Diagnosis Date Noted   Pernicious anemia 05/20/2021   Goals of care, counseling/discussion 09/02/2020   Malignant neoplasm of upper-outer quadrant of left breast in female, estrogen receptor positive ([place]) 08/27/2020   Iron deficiency anemia 10/09/2019   Impaired mobility and activities of daily living 08/30/2018   Multiple myeloma ([place]) 08/30/2018   S/P spinal fusion 08/30/2018   Lumbar degenerative disc disease 08/27/2018   History of degenerative disc disease 08/15/2018   Neck pain 08/15/2018   Sensorineural hearing loss (SNHL), bilateral 08/15/2018  Tinnitus of both ears 08/15/2018   Smoldering multiple myeloma 12/04/2017   Breakdown (mechanical) of implanted electronic neurostimulator,  generator, initial encounter ([place]) 08/12/2016   Myalgia 08/12/2016   Post laminectomy syndrome 08/12/2016   Migraine with aura and without status migrainosus, not intractable 10/15/2015   Intrinsic asthma 01/11/2010   Dyspnea 12/14/2009   DIABETES MELLITUS, TYPE II 12/11/2009   Hyperlipidemia 12/11/2009   MIGRAINE HEADACHE 12/11/2009   Hereditary and idiopathic peripheral neuropathy 12/11/2009   Hypertension 12/11/2009   Insomnia 12/11/2009   EDEMA 12/11/2009   Type 2 diabetes mellitus with diabetic polyneuropathy ([place]) 12/11/2009    Otelia Limes, PTA 08/09/2021, 4:44 PM  [place] @ [place], [place], [address] Phone: [phone]   Fax:  [phone]  Name: [name] MRN: [id number] Date of Birth: 1950-10-28

## 2021-08-12 DIAGNOSIS — M19071 Primary osteoarthritis, right ankle and foot: Secondary | ICD-10-CM | POA: Diagnosis not present

## 2021-08-12 DIAGNOSIS — L602 Onychogryphosis: Secondary | ICD-10-CM | POA: Diagnosis not present

## 2021-08-12 DIAGNOSIS — I739 Peripheral vascular disease, unspecified: Secondary | ICD-10-CM | POA: Diagnosis not present

## 2021-08-19 ENCOUNTER — Ambulatory Visit: Payer: BC Managed Care – PPO | Admitting: Hematology & Oncology

## 2021-08-19 ENCOUNTER — Other Ambulatory Visit: Payer: BC Managed Care – PPO

## 2021-08-19 DIAGNOSIS — R42 Dizziness and giddiness: Secondary | ICD-10-CM | POA: Diagnosis not present

## 2021-08-31 DIAGNOSIS — E1142 Type 2 diabetes mellitus with diabetic polyneuropathy: Secondary | ICD-10-CM | POA: Diagnosis not present

## 2021-08-31 DIAGNOSIS — M961 Postlaminectomy syndrome, not elsewhere classified: Secondary | ICD-10-CM | POA: Diagnosis not present

## 2021-08-31 DIAGNOSIS — Z79891 Long term (current) use of opiate analgesic: Secondary | ICD-10-CM | POA: Diagnosis not present

## 2021-08-31 DIAGNOSIS — G894 Chronic pain syndrome: Secondary | ICD-10-CM | POA: Diagnosis not present

## 2021-09-02 DIAGNOSIS — I739 Peripheral vascular disease, unspecified: Secondary | ICD-10-CM | POA: Diagnosis not present

## 2021-09-03 DIAGNOSIS — C50912 Malignant neoplasm of unspecified site of left female breast: Secondary | ICD-10-CM | POA: Diagnosis not present

## 2021-09-06 ENCOUNTER — Other Ambulatory Visit: Payer: Self-pay | Admitting: Hematology & Oncology

## 2021-09-06 DIAGNOSIS — D510 Vitamin B12 deficiency anemia due to intrinsic factor deficiency: Secondary | ICD-10-CM

## 2021-09-07 ENCOUNTER — Telehealth: Payer: Self-pay | Admitting: *Deleted

## 2021-09-07 ENCOUNTER — Encounter: Payer: Self-pay | Admitting: Family

## 2021-09-07 ENCOUNTER — Inpatient Hospital Stay (HOSPITAL_BASED_OUTPATIENT_CLINIC_OR_DEPARTMENT_OTHER): Payer: BC Managed Care – PPO | Admitting: Hematology & Oncology

## 2021-09-07 ENCOUNTER — Other Ambulatory Visit: Payer: Self-pay

## 2021-09-07 ENCOUNTER — Inpatient Hospital Stay: Payer: BC Managed Care – PPO | Attending: Hematology & Oncology

## 2021-09-07 VITALS — BP 141/77 | HR 93 | Temp 98.9°F | Resp 16 | Wt 203.0 lb

## 2021-09-07 DIAGNOSIS — D510 Vitamin B12 deficiency anemia due to intrinsic factor deficiency: Secondary | ICD-10-CM | POA: Diagnosis not present

## 2021-09-07 DIAGNOSIS — Z79811 Long term (current) use of aromatase inhibitors: Secondary | ICD-10-CM | POA: Diagnosis not present

## 2021-09-07 DIAGNOSIS — G629 Polyneuropathy, unspecified: Secondary | ICD-10-CM | POA: Insufficient documentation

## 2021-09-07 DIAGNOSIS — Z170 Estrogen receptor positive status [ER+]: Secondary | ICD-10-CM

## 2021-09-07 DIAGNOSIS — C9000 Multiple myeloma not having achieved remission: Secondary | ICD-10-CM | POA: Diagnosis not present

## 2021-09-07 DIAGNOSIS — C50912 Malignant neoplasm of unspecified site of left female breast: Secondary | ICD-10-CM | POA: Insufficient documentation

## 2021-09-07 DIAGNOSIS — D472 Monoclonal gammopathy: Secondary | ICD-10-CM

## 2021-09-07 DIAGNOSIS — Z923 Personal history of irradiation: Secondary | ICD-10-CM | POA: Insufficient documentation

## 2021-09-07 DIAGNOSIS — R42 Dizziness and giddiness: Secondary | ICD-10-CM | POA: Insufficient documentation

## 2021-09-07 DIAGNOSIS — Z79899 Other long term (current) drug therapy: Secondary | ICD-10-CM | POA: Diagnosis not present

## 2021-09-07 DIAGNOSIS — C50412 Malignant neoplasm of upper-outer quadrant of left female breast: Secondary | ICD-10-CM | POA: Diagnosis not present

## 2021-09-07 LAB — CMP (CANCER CENTER ONLY)
ALT: 14 U/L (ref 0–44)
AST: 17 U/L (ref 15–41)
Albumin: 4.3 g/dL (ref 3.5–5.0)
Alkaline Phosphatase: 58 U/L (ref 38–126)
Anion gap: 7 (ref 5–15)
BUN: 17 mg/dL (ref 8–23)
CO2: 32 mmol/L (ref 22–32)
Calcium: 10.2 mg/dL (ref 8.9–10.3)
Chloride: 99 mmol/L (ref 98–111)
Creatinine: 0.85 mg/dL (ref 0.44–1.00)
GFR, Estimated: >60 mL/min (ref >60)
Glucose, Bld: 155 mg/dL — ABNORMAL HIGH (ref 70–99)
Potassium: 3.9 mmol/L (ref 3.5–5.1)
Sodium: 138 mmol/L (ref 135–145)
Total Bilirubin: 0.5 mg/dL (ref 0.3–1.2)
Total Protein: 7.4 g/dL (ref 6.5–8.1)

## 2021-09-07 LAB — VITAMIN B12: Vitamin B-12: 348 pg/mL (ref 180–914)

## 2021-09-07 LAB — CBC WITH DIFFERENTIAL (CANCER CENTER ONLY)
Abs Immature Granulocytes: 0.02 10*3/uL (ref 0.00–0.07)
Basophils Absolute: 0.1 10*3/uL (ref 0.0–0.1)
Basophils Relative: 1 %
Eosinophils Absolute: 0.6 10*3/uL — ABNORMAL HIGH (ref 0.0–0.5)
Eosinophils Relative: 6 %
HCT: 39.5 % (ref 36.0–46.0)
Hemoglobin: 12.9 g/dL (ref 12.0–15.0)
Immature Granulocytes: 0 %
Lymphocytes Relative: 38 %
Lymphs Abs: 3.8 10*3/uL (ref 0.7–4.0)
MCH: 29.3 pg (ref 26.0–34.0)
MCHC: 32.7 g/dL (ref 30.0–36.0)
MCV: 89.6 fL (ref 80.0–100.0)
Monocytes Absolute: 0.8 10*3/uL (ref 0.1–1.0)
Monocytes Relative: 9 %
Neutro Abs: 4.6 10*3/uL (ref 1.7–7.7)
Neutrophils Relative %: 46 %
Platelet Count: 347 10*3/uL (ref 150–400)
RBC: 4.41 MIL/uL (ref 3.87–5.11)
RDW: 12.8 % (ref 11.5–15.5)
WBC Count: 9.9 10*3/uL (ref 4.0–10.5)
nRBC: 0 % (ref 0.0–0.2)

## 2021-09-07 LAB — LACTATE DEHYDROGENASE: LDH: 164 U/L (ref 98–192)

## 2021-09-07 MED ORDER — MECLIZINE HCL 25 MG PO TABS: 25.0000 mg | ORAL_TABLET | Freq: Three times a day (TID) | ORAL | 4 refills | Status: DC | PRN

## 2021-09-07 NOTE — Telephone Encounter (Signed)
Per 09/07/21 los - gave upcoming appointments - confirmed

## 2021-09-07 NOTE — Addendum Note (Signed)
Addended by: Burney Gauze R on: 09/07/2021 10:31 AM   Modules accepted: Orders

## 2021-09-07 NOTE — Progress Notes (Signed)
Hematology and Oncology Follow Up Visit  [name] [id number] 29-Jul-1951 70 y.o. 09/07/2021   Principle Diagnosis:  1. Stage IA (T1cN0M0) infiltrating ductal carcinoma of the LEFT breast -- ER+/PR+/HER2- -- Oncotype = 16 2. IgG Kappa smoldering myeloma 3. Chronic neuropathy 4. Pernicious Anemia  Current Therapy:   Velcade q week dosing (3/1) -- s/p cycle #2 -- d/c on 02/06/2018 for lack of effectiveness Vit B12 1000 mcg IM q month - start on 03/25/2020 S/p XRT to the LEFT breast -- completed on 01/01/2021 Femara 2.5 mg po q day -- start on 01/19/2021  Interim History:  [name] is here today for follow-up.  We last saw her back in September.  Since then, she has been doing fairly well.  She has vertigo.  She is not yet taking meclizine for this.  She does have the breast cancer.  This is not an issue from my point of view.  She is on Femara.  She does not complain of any new arthralgias or myalgias.  She has had a chronic arthralgias.  As far as her myeloma is concerned, back in September her monoclonal spike was 0.9 g/dL.  The IgG level was was 1700 mg/dL.  The Kappa light chain was 4.2 mg/dL.  Is her daughter's birthday on New Year's day.  They will go out to eat..  She has had no problems with fever.  She has had no rashes.  There has been no bleeding.  She has had no change in bowel or bladder habits.  There is been no dysuria.  She has had no diarrhea.  She does her vitamin B12 injections at home.  Her husband gives her this.  When we last checked in September, her vitamin B12 level was 169.  Thankfully, she has avoided COVID.  Overall, her performance status is ECOG 1.     Medications:  Allergies as of 09/07/2021       Reactions   Dristan [pheniramine-phenylephrine] Palpitations   Other Palpitations   Contact "old cold medicine        Medication List        Accurate as of September 07, 2021  9:40 AM. If you have any questions, ask your nurse or  doctor.          Accu-Chek Softclix Lancets lancets   albuterol 108 (90 Base) MCG/ACT inhaler Commonly known as: VENTOLIN HFA Inhale into the lungs every 6 (six) hours as needed for wheezing or shortness of breath.   amitriptyline 10 MG tablet Commonly known as: ELAVIL Take 10 mg by mouth at bedtime.   cyanocobalamin 1000 MCG/ML injection Commonly known as: (VITAMIN B-12) INJECT 1 ML (1,000 MCG TOTAL) INTO THE MUSCLE ONCE A WEEK FOR 4 DOSES.   cyclobenzaprine 5 MG tablet Commonly known as: FLEXERIL Take 5 mg by mouth 3 (three) times daily as needed.   Diovan HCT 160-12.5 MG tablet Generic drug: valsartan-hydrochlorothiazide   ergocalciferol 1.25 MG (50000 UT) capsule Commonly known as: VITAMIN D2 Take 1 capsule (50,000 Units total) by mouth once a week.   famciclovir 500 MG tablet Commonly known as: FAMVIR Take 500 mg by mouth.   FreeStyle [employer] [place] [employer] Kerrin Mo See admin instructions.   gabapentin 300 MG capsule Commonly known as: NEURONTIN Take 300 mg by mouth.   HYDROcodone-acetaminophen 10-325 MG tablet Commonly known as: NORCO   letrozole 2.5 MG tablet Commonly known as: FEMARA Take 1 tablet (2.5 mg total) by  mouth daily.   metFORMIN 500 MG 24 hr tablet Commonly known as: GLUCOPHAGE-XR Take 1,000 mg by mouth 2 (two) times daily.   metoprolol succinate 100 MG 24 hr tablet Commonly known as: TOPROL-XL Take 100 mg by mouth daily.   morphine 60 MG 12 hr tablet Commonly known as: MS CONTIN Take 60 mg by mouth every 12 (twelve) hours.   pravastatin 40 MG tablet Commonly known as: PRAVACHOL Take 40 mg by mouth daily.   terbinafine 250 MG tablet Commonly known as: LAMISIL Take 250 mg by mouth.   venlafaxine XR 150 MG 24 hr capsule Commonly known as: EFFEXOR-XR Take 150 mg by mouth daily with breakfast.        Allergies:  Allergies  Allergen Reactions   Dristan [Pheniramine-Phenylephrine] Palpitations    Other Palpitations    Contact "old cold medicine    Past Medical History, Surgical history, Social history, and Family History were reviewed and updated.  Review of Systems: Review of Systems  Constitutional:  Positive for malaise/fatigue.  HENT: Negative.    Eyes: Negative.   Respiratory:  Positive for shortness of breath.   Cardiovascular:  Positive for palpitations and leg swelling.  Gastrointestinal:  Positive for constipation and heartburn.  Genitourinary: Negative.   Musculoskeletal:  Positive for joint pain and myalgias.  Skin: Negative.   Neurological:  Positive for tingling.  Endo/Heme/Allergies: Negative.   Psychiatric/Behavioral: Negative.      Physical Exam:  weight is 203 lb (92.1 kg). Her oral temperature is 98.9 F (37.2 C). Her blood pressure is 141/77 (abnormal) and her pulse is 93. Her respiration is 16 and oxygen saturation is 96%.   Wt Readings from Last 3 Encounters:  09/07/21 203 lb (92.1 kg)  08/09/21 200 lb 2 oz (90.8 kg)  05/20/21 204 lb 1.9 oz (92.6 kg)  Is 1 weight 1 1% patient is no  Physical Exam Vitals reviewed.  Constitutional:      Comments: On her breast exam, the right breast shows the biopsy site to be well-healed.  There is no distinct mass in the right breast.  There is no right axillary adenopathy.  Left breast shows the lumpectomy scar at the 1 o'clock position.  She has some slight erythema on the left breast from radiation.  There is no actual skin breakdown.  This is at the edge of the areola.  She has a well healing lymphadenectomy scar in the left axilla.  There is no axillary adenopathy in the left axilla.  HENT:     Head: Normocephalic and atraumatic.  Eyes:     Pupils: Pupils are equal, round, and reactive to light.  Cardiovascular:     Rate and Rhythm: Normal rate and regular rhythm.     Heart sounds: Normal heart sounds.  Pulmonary:     Effort: Pulmonary effort is normal.     Breath sounds: Normal breath sounds.   Abdominal:     General: Bowel sounds are normal.     Palpations: Abdomen is soft.  Musculoskeletal:        General: No tenderness or deformity. Normal range of motion.     Cervical back: Normal range of motion.     Comments: On her left arm, she has a compression sleeve.  There is some edema.  She has decent range of motion of her joints.    Lymphadenopathy:     Cervical: No cervical adenopathy.  Skin:    General: Skin is warm and dry.  Findings: No erythema or rash.  Neurological:     Mental Status: She is alert and oriented to person, place, and time.  Psychiatric:        Behavior: Behavior normal.        Thought Content: Thought content normal.        Judgment: Judgment normal.    Lab Results  Component Value Date   WBC 9.9 09/07/2021   HGB 12.9 09/07/2021   HCT 39.5 09/07/2021   MCV 89.6 09/07/2021   PLT 347 09/07/2021   Lab Results  Component Value Date   FERRITIN 55 10/03/2019   IRON 68 10/03/2019   TIBC 389 10/03/2019   UIBC 321 10/03/2019   IRONPCTSAT 17 (L) 10/03/2019   Lab Results  Component Value Date   RBC 4.41 09/07/2021   Lab Results  Component Value Date   KPAFRELGTCHN 41.9 (H) 05/20/2021   LAMBDASER 31.6 (H) 05/20/2021   KAPLAMBRATIO 1.33 05/20/2021   Lab Results  Component Value Date   IGGSERUM 1,458 05/20/2021   IGGSERUM 1,709 (H) 05/20/2021   IGA 67 (L) 05/20/2021   IGA 66 (L) 05/20/2021   IGMSERUM 48 05/20/2021   IGMSERUM 46 05/20/2021   Lab Results  Component Value Date   TOTALPROTELP 7.2 05/20/2021   ALBUMINELP 3.7 05/20/2021   A1GS 0.2 05/20/2021   A2GS 0.8 05/20/2021   BETS 1.1 05/20/2021   BETA2SER 0.3 02/20/2015   GAMS 1.4 05/20/2021   MSPIKE 0.9 (H) 05/20/2021   SPEI * 02/20/2015     Chemistry      Component Value Date/Time   NA 138 09/07/2021 0847   NA 139 03/23/2020 1220   NA 140 03/27/2017 0804   K 3.9 09/07/2021 0847   K 3.7 03/27/2017 0804   CL 99 09/07/2021 0847   CL 94 (L) 02/20/2015 1010   CO2 32  09/07/2021 0847   CO2 28 03/27/2017 0804   BUN 17 09/07/2021 0847   BUN 18 03/23/2020 1220   BUN 14.9 03/27/2017 0804   CREATININE 0.85 09/07/2021 0847   CREATININE 0.9 03/27/2017 0804      Component Value Date/Time   CALCIUM 10.2 09/07/2021 0847   CALCIUM 10.0 03/27/2017 0804   ALKPHOS 58 09/07/2021 0847   ALKPHOS 69 03/27/2017 0804   AST 17 09/07/2021 0847   AST 26 03/27/2017 0804   ALT 14 09/07/2021 0847   ALT 28 03/27/2017 0804   BILITOT 0.5 09/07/2021 0847   BILITOT 0.76 03/27/2017 0804     Impression and Plan: [name] is 70 yo postmenopausal white female with smoldering myeloma and also chronic neuropathy due to diabetes and chronic back issues.   She has a stage Ia ductal carcinoma of the left breast.  She underwent a lumpectomy.  She has a low Oncotype score.    She is doing well on the Femara.  She already has hot flashes and sweats.  The Femara may be making these a bit more prominent.  I just do not think that breast cancer recurrence again be a real issue for her.  Hopefully, the lymphedema in the left arm will improve a little bit.  The IgG kappa myeloma seems to be holding pretty steady.  I encouraged her to take the meclizine for the vertigo.  We will plan to get her back in another 3 months.  I think this would be reasonable.     [name], MD 12/27/20229:40 AM

## 2021-09-08 ENCOUNTER — Telehealth: Payer: Self-pay

## 2021-09-08 LAB — IGG, IGA, IGM
IgA: 61 mg/dL — ABNORMAL LOW (ref 87–352)
IgG (Immunoglobin G), Serum: 1520 mg/dL (ref 586–1602)
IgM (Immunoglobulin M), Srm: 43 mg/dL (ref 26–217)

## 2021-09-08 LAB — KAPPA/LAMBDA LIGHT CHAINS
Kappa free light chain: 35.6 mg/L — ABNORMAL HIGH (ref 3.3–19.4)
Kappa, lambda light chain ratio: 1.27 (ref 0.26–1.65)
Lambda free light chains: 28 mg/L — ABNORMAL HIGH (ref 5.7–26.3)

## 2021-09-08 NOTE — Telephone Encounter (Signed)
-----   Message from [name], MD sent at 09/07/2021  4:42 PM EST ----- Call - the Vit B12 level is much better!!!  [name]

## 2021-09-08 NOTE — Telephone Encounter (Signed)
Called and informed patient of lab results, patient verbalized understanding and denies any questions or concerns at this time.   

## 2021-09-14 LAB — PROTEIN ELECTROPHORESIS, SERUM, WITH REFLEX
A/G Ratio: 1.1 (ref 0.7–1.7)
Albumin ELP: 3.9 g/dL (ref 2.9–4.4)
Alpha-1-Globulin: 0.2 g/dL (ref 0.0–0.4)
Alpha-2-Globulin: 0.8 g/dL (ref 0.4–1.0)
Beta Globulin: 1.1 g/dL (ref 0.7–1.3)
Gamma Globulin: 1.3 g/dL (ref 0.4–1.8)
Globulin, Total: 3.4 g/dL (ref 2.2–3.9)
M-Spike, %: 0.7 g/dL — ABNORMAL HIGH
SPEP Interpretation: 0
Total Protein ELP: 7.3 g/dL (ref 6.0–8.5)

## 2021-09-14 LAB — IMMUNOFIXATION REFLEX, SERUM
IgA: 60 mg/dL — ABNORMAL LOW (ref 87–352)
IgG (Immunoglobin G), Serum: 1577 mg/dL (ref 586–1602)
IgM (Immunoglobulin M), Srm: 43 mg/dL (ref 26–217)

## 2021-09-17 ENCOUNTER — Ambulatory Visit (HOSPITAL_BASED_OUTPATIENT_CLINIC_OR_DEPARTMENT_OTHER): Payer: BC Managed Care – PPO | Admitting: Obstetrics & Gynecology

## 2021-09-17 ENCOUNTER — Ambulatory Visit (HOSPITAL_BASED_OUTPATIENT_CLINIC_OR_DEPARTMENT_OTHER): Payer: BC Managed Care – PPO | Admitting: Medical

## 2021-09-30 DIAGNOSIS — E1142 Type 2 diabetes mellitus with diabetic polyneuropathy: Secondary | ICD-10-CM | POA: Diagnosis not present

## 2021-10-02 ENCOUNTER — Other Ambulatory Visit: Payer: Self-pay | Admitting: Hematology & Oncology

## 2021-10-02 DIAGNOSIS — D510 Vitamin B12 deficiency anemia due to intrinsic factor deficiency: Secondary | ICD-10-CM

## 2021-10-03 ENCOUNTER — Encounter: Payer: Self-pay | Admitting: Family

## 2021-10-04 ENCOUNTER — Ambulatory Visit: Payer: Medicare Other | Admitting: Podiatry

## 2021-10-05 ENCOUNTER — Institutional Professional Consult (permissible substitution): Payer: Medicare Other | Admitting: Neurology

## 2021-10-11 DIAGNOSIS — Z853 Personal history of malignant neoplasm of breast: Secondary | ICD-10-CM | POA: Diagnosis not present

## 2021-10-27 DIAGNOSIS — M961 Postlaminectomy syndrome, not elsewhere classified: Secondary | ICD-10-CM | POA: Diagnosis not present

## 2021-10-27 DIAGNOSIS — G894 Chronic pain syndrome: Secondary | ICD-10-CM | POA: Diagnosis not present

## 2021-10-27 DIAGNOSIS — Z79891 Long term (current) use of opiate analgesic: Secondary | ICD-10-CM | POA: Diagnosis not present

## 2021-10-27 DIAGNOSIS — E1142 Type 2 diabetes mellitus with diabetic polyneuropathy: Secondary | ICD-10-CM | POA: Diagnosis not present

## 2021-11-08 ENCOUNTER — Ambulatory Visit: Payer: BC Managed Care – PPO | Attending: General Surgery

## 2021-11-08 ENCOUNTER — Other Ambulatory Visit: Payer: Self-pay

## 2021-11-08 VITALS — Wt 197.0 lb

## 2021-11-08 DIAGNOSIS — Z483 Aftercare following surgery for neoplasm: Secondary | ICD-10-CM | POA: Insufficient documentation

## 2021-11-08 NOTE — Therapy (Signed)
[place] @ [place], [place], [address] Phone: [phone]   Fax:  [phone]  Physical Therapy Treatment  Patient Details  Name: [name] MRN: [id number] Date of Birth: 10/26/1950 Referring Provider (PT): Dr. Marin Olp   Encounter Date: 11/08/2021   PT End of Session - 11/08/21 1650     Visit Number 9   # unchanged due to screen only   PT Start Time 1647    PT Stop Time 1655    PT Time Calculation (min) 8 min    Activity Tolerance Patient tolerated treatment well    Behavior During Therapy [facility] for tasks assessed/performed             Past Medical History:  Diagnosis Date   Allergies    Anxiety    Asthma    treated by pulmonologist    Atherosclerosis of aorta ([place])    noted on xray   Breast cancer ([place])    Right   Chronic pain    Depression    Diabetes ([place])    type II   Edema    Goals of care, counseling/discussion 09/02/2020   High cholesterol    History of nuclear stress test 11/12/2009   normal    Hypertension    IgG monoclonal gammopathy of uncertain significance    igG kappa monoclonal gammopathy of unkown significance (per notes from [employer]).   Insomnia    Low back pain    Migraine headache    Neuropathy associated with MGUS ([facility])    Peripheral neuropathy    Pernicious anemia 05/20/2021   PONV (postoperative nausea and vomiting)    hard to wake up   Sleep apnea    Smoldering multiple myeloma ([place]) 12/04/2017   Vertigo     Past Surgical History:  Procedure Laterality Date   BACK SURGERY  2019   T10-S1 Fusion Dr. Hulda Marin SURGERY  2019   2 fusion in lower back (L4-S1) and neck   breast cyst removal age 61     BREAST LUMPECTOMY WITH RADIOACTIVE SEED AND SENTINEL LYMPH NODE BIOPSY Left 09/30/2020   Procedure: RADIOACTIVE SEED GUIDED LEFT BREAST LUMPECTOMY, LEFT AXILLARY SENTINEL LYMPH NODE BIOPSY;  Surgeon: Jovita Kussmaul, MD;  Location: [place];  Service: General;  Laterality: Left;   CESAREAN SECTION     x2   hernia repair at 45 months old     miscarriage D&C     NASAL SINUS SURGERY     x2   NECK SURGERY     pain stimulator in back     RE-EXCISION OF BREAST CANCER,SUPERIOR MARGINS Left 10/19/2020   Procedure: RE-EXCISION LEFT BREAST MARGINS;  Surgeon: Jovita Kussmaul, MD;  Location: [place];  Service: General;  Laterality: Left;   TONSILLECTOMY      Vitals:   11/08/21 1651  Weight: 197 lb (89.4 kg)     Subjective Assessment - 11/08/21 1650     Subjective Pt returns for her 3 month L-Dex screen.    Pertinent History Recent diagnosis of Lt breast cancer IDC stage IA with surgery 09/30/20 with 3 lymph nodes removed and then re-excision 10/19/20.  pt is followed by Dr. Marin Olp due to smoldering igG Kappa myeloma.  Other history includes: DM, Peripheral neuropathy, and HTN, cervical fusion with limited movement and back T10-S1, vertigo, recent fall with hand fracture  L-DEX FLOWSHEETS - 11/08/21 1600       L-DEX LYMPHEDEMA SCREENING   Measurement Type Unilateral    L-DEX MEASUREMENT EXTREMITY Upper Extremity    POSITION  Standing    DOMINANT SIDE Right    At Risk Side Left    BASELINE SCORE (UNILATERAL) -2.6    L-DEX SCORE (UNILATERAL) 3    VALUE CHANGE (UNILAT) 5.6                                     PT Long Term Goals - 04/09/21 1216       PT LONG TERM GOAL #1   Title Pt/family memeber  will be independent with performance of MLD    Time 6    Period Weeks    Status Partially Met      PT LONG TERM GOAL #2   Baseline measured for custom but didn't fit and was remeasured last week    Time 6    Period Weeks    Status On-going      PT LONG TERM GOAL #3   Title Pt will be independent with donning compression garment with device prn    Time 6    Period Weeks    Status On-going      PT LONG TERM GOAL #4   Title Pt will have improved  swelling noted in left UE    Time 6    Period Weeks    Status Achieved                   Plan - 11/08/21 1652     Clinical Impression Statement Pt returns for her 3 month L-Dex screen. Her change from baseline of 5.6 is WNLs so no further treatment is required at this time except to cont every 3 month L-Dex screen which pt is agreeable to.    PT Next Visit Plan Cont every 3 month L-Dex screens for up to 2 years from her SLNB (~09/30/22)    Consulted and Agree with Plan of Care Patient             Patient will benefit from skilled therapeutic intervention in order to improve the following deficits and impairments:     Visit Diagnosis: Aftercare following surgery for neoplasm     Problem List Patient Active Problem List   Diagnosis Date Noted   Pernicious anemia 05/20/2021   Goals of care, counseling/discussion 09/02/2020   Malignant neoplasm of upper-outer quadrant of left breast in female, estrogen receptor positive ([place]) 08/27/2020   Iron deficiency anemia 10/09/2019   Impaired mobility and activities of daily living 08/30/2018   Multiple myeloma ([place]) 08/30/2018   S/P spinal fusion 08/30/2018   Lumbar degenerative disc disease 08/27/2018   History of degenerative disc disease 08/15/2018   Neck pain 08/15/2018   Sensorineural hearing loss (SNHL), bilateral 08/15/2018   Tinnitus of both ears 08/15/2018   Smoldering multiple myeloma 12/04/2017   Breakdown (mechanical) of implanted electronic neurostimulator, generator, initial encounter ([place]) 08/12/2016   Myalgia 08/12/2016   Post laminectomy syndrome 08/12/2016   Migraine with aura and without status migrainosus, not intractable 10/15/2015   Intrinsic asthma 01/11/2010   Dyspnea 12/14/2009   DIABETES MELLITUS, TYPE II 12/11/2009   Hyperlipidemia 12/11/2009   MIGRAINE HEADACHE 12/11/2009   Hereditary and idiopathic peripheral neuropathy 12/11/2009   Hypertension 12/11/2009   Insomnia 12/11/2009   EDEMA  12/11/2009  Type 2 diabetes mellitus with diabetic polyneuropathy ([place]) 12/11/2009    Otelia Limes, PTA 11/08/2021, 4:57 PM  [facility] @ [place], [place], [address] Phone: [phone]   Fax:  [phone]  Name: JARIANA SHUMARD MRN: [id number] Date of Birth: 1951-08-16

## 2021-12-06 ENCOUNTER — Inpatient Hospital Stay: Payer: BC Managed Care – PPO | Admitting: Hematology & Oncology

## 2021-12-06 ENCOUNTER — Inpatient Hospital Stay: Payer: BC Managed Care – PPO

## 2021-12-22 DIAGNOSIS — G894 Chronic pain syndrome: Secondary | ICD-10-CM | POA: Diagnosis not present

## 2021-12-22 DIAGNOSIS — M961 Postlaminectomy syndrome, not elsewhere classified: Secondary | ICD-10-CM | POA: Diagnosis not present

## 2021-12-22 DIAGNOSIS — E1142 Type 2 diabetes mellitus with diabetic polyneuropathy: Secondary | ICD-10-CM | POA: Diagnosis not present

## 2021-12-22 DIAGNOSIS — Z79891 Long term (current) use of opiate analgesic: Secondary | ICD-10-CM | POA: Diagnosis not present

## 2021-12-29 ENCOUNTER — Other Ambulatory Visit: Payer: Self-pay | Admitting: Hematology & Oncology

## 2021-12-30 ENCOUNTER — Encounter: Payer: Self-pay | Admitting: Family

## 2021-12-31 ENCOUNTER — Inpatient Hospital Stay: Payer: BC Managed Care – PPO | Admitting: Hematology & Oncology

## 2021-12-31 ENCOUNTER — Inpatient Hospital Stay: Payer: BC Managed Care – PPO

## 2022-01-04 ENCOUNTER — Encounter (HOSPITAL_COMMUNITY): Payer: Self-pay

## 2022-01-04 DIAGNOSIS — E782 Mixed hyperlipidemia: Secondary | ICD-10-CM | POA: Diagnosis not present

## 2022-01-04 DIAGNOSIS — D472 Monoclonal gammopathy: Secondary | ICD-10-CM | POA: Diagnosis not present

## 2022-01-04 DIAGNOSIS — E1140 Type 2 diabetes mellitus with diabetic neuropathy, unspecified: Secondary | ICD-10-CM | POA: Diagnosis not present

## 2022-01-04 DIAGNOSIS — I10 Essential (primary) hypertension: Secondary | ICD-10-CM | POA: Diagnosis not present

## 2022-01-10 ENCOUNTER — Inpatient Hospital Stay: Payer: BC Managed Care – PPO

## 2022-01-10 ENCOUNTER — Inpatient Hospital Stay: Payer: BC Managed Care – PPO | Admitting: Hematology & Oncology

## 2022-01-11 DIAGNOSIS — C50412 Malignant neoplasm of upper-outer quadrant of left female breast: Secondary | ICD-10-CM | POA: Diagnosis not present

## 2022-01-11 DIAGNOSIS — Z170 Estrogen receptor positive status [ER+]: Secondary | ICD-10-CM | POA: Diagnosis not present

## 2022-01-20 DIAGNOSIS — H903 Sensorineural hearing loss, bilateral: Secondary | ICD-10-CM | POA: Diagnosis not present

## 2022-01-20 DIAGNOSIS — B351 Tinea unguium: Secondary | ICD-10-CM | POA: Diagnosis not present

## 2022-01-20 DIAGNOSIS — L602 Onychogryphosis: Secondary | ICD-10-CM | POA: Diagnosis not present

## 2022-01-24 ENCOUNTER — Other Ambulatory Visit: Payer: Self-pay | Admitting: Hematology & Oncology

## 2022-01-31 ENCOUNTER — Ambulatory Visit: Payer: BC Managed Care – PPO | Attending: General Surgery

## 2022-01-31 VITALS — Wt 199.0 lb

## 2022-01-31 DIAGNOSIS — Z483 Aftercare following surgery for neoplasm: Secondary | ICD-10-CM | POA: Insufficient documentation

## 2022-01-31 NOTE — Therapy (Signed)
OUTPATIENT PHYSICAL THERAPY SOZO SCREENING NOTE   Patient Name: [name] MRN: [id number] DOB:07/18/1951, 71 y.o., female Today's Date: 01/31/2022  PCP: Carol Ada, MD REFERRING PROVIDER: Jovita Kussmaul, MD   PT End of Session - 01/31/22 1649     Visit Number 9   # unchanged due to screen only   PT Start Time 1649    PT Stop Time 1655    PT Time Calculation (min) 6 min    Activity Tolerance Patient tolerated treatment well    Behavior During Therapy [facility] for tasks assessed/performed             Past Medical History:  Diagnosis Date   Allergies    Anxiety    Asthma    treated by pulmonologist    Atherosclerosis of aorta ([place])    noted on xray   Breast cancer ([place])    Right   Chronic pain    Depression    Diabetes ([place])    type II   Edema    Goals of care, counseling/discussion 09/02/2020   High cholesterol    History of nuclear stress test 11/12/2009   normal    Hypertension    IgG monoclonal gammopathy of uncertain significance    igG kappa monoclonal gammopathy of unkown significance (per notes from [employer]).   Insomnia    Low back pain    Migraine headache    Neuropathy associated with MGUS (HCC)    Peripheral neuropathy    Pernicious anemia 05/20/2021   PONV (postoperative nausea and vomiting)    hard to wake up   Sleep apnea    Smoldering multiple myeloma ([place]) 12/04/2017   Vertigo    Past Surgical History:  Procedure Laterality Date   BACK SURGERY  2019   T10-S1 Fusion Dr. Hulda Marin SURGERY  2019   2 fusion in lower back (L4-S1) and neck   breast cyst removal age 59     BREAST LUMPECTOMY WITH RADIOACTIVE SEED AND SENTINEL LYMPH NODE BIOPSY Left 09/30/2020   Procedure: RADIOACTIVE SEED GUIDED LEFT BREAST LUMPECTOMY, LEFT AXILLARY SENTINEL LYMPH NODE BIOPSY;  Surgeon: Jovita Kussmaul, MD;  Location: [place];  Service: General;  Laterality: Left;   CESAREAN SECTION     x2   hernia repair at 71 months old      miscarriage D&C     NASAL SINUS SURGERY     x2   NECK SURGERY     pain stimulator in back     RE-EXCISION OF BREAST CANCER,SUPERIOR MARGINS Left 10/19/2020   Procedure: RE-EXCISION LEFT BREAST MARGINS;  Surgeon: Jovita Kussmaul, MD;  Location: [place];  Service: General;  Laterality: Left;   TONSILLECTOMY     Patient Active Problem List   Diagnosis Date Noted   Pernicious anemia 05/20/2021   Goals of care, counseling/discussion 09/02/2020   Malignant neoplasm of upper-outer quadrant of left breast in female, estrogen receptor positive ([place]) 08/27/2020   Iron deficiency anemia 10/09/2019   Impaired mobility and activities of daily living 08/30/2018   Multiple myeloma ([place]) 08/30/2018   S/P spinal fusion 08/30/2018   Lumbar degenerative disc disease 08/27/2018   History of degenerative disc disease 08/15/2018   Neck pain 08/15/2018   Sensorineural hearing loss (SNHL), bilateral 08/15/2018   Tinnitus of both ears 08/15/2018   Smoldering multiple myeloma 12/04/2017   Breakdown (mechanical) of implanted electronic neurostimulator, generator, initial encounter ([place]) 08/12/2016   Myalgia 08/12/2016  Post laminectomy syndrome 08/12/2016   Migraine with aura and without status migrainosus, not intractable 10/15/2015   Intrinsic asthma 01/11/2010   Dyspnea 12/14/2009   DIABETES MELLITUS, TYPE II 12/11/2009   Hyperlipidemia 12/11/2009   MIGRAINE HEADACHE 12/11/2009   Hereditary and idiopathic peripheral neuropathy 12/11/2009   Hypertension 12/11/2009   Insomnia 12/11/2009   EDEMA 12/11/2009   Type 2 diabetes mellitus with diabetic polyneuropathy ([place]) 12/11/2009    REFERRING DIAG: left breast cancer at risk for lymphedema  THERAPY DIAG:  Aftercare following surgery for neoplasm  PERTINENT HISTORY: Recent diagnosis of Lt breast cancer IDC stage IA with surgery 09/30/20 with 3 lymph nodes removed and then re-excision 10/19/20.  pt is followed by Dr. Marin Olp due to  smoldering igG Kappa myeloma.  Other history includes: DM, Peripheral neuropathy, and HTN, cervical fusion with limited movement and back T10-S1, vertigo, recent fall with hand fracture   PRECAUTIONS: left UE Lymphedema risk, None  SUBJECTIVE: Pt returns for her 3 month L-Dex screen.   PAIN:  Are you having pain? No  SOZO SCREENING:  Patient was assessed today using the SOZO machine to determine the lymphedema index score. This was compared to her baseline score. It was determined that she is NOT within the recommended range when compared to her baseline. She already has a compression garment so will begin wearing this 10 hrs/day for 1 month. It is recommended she return in 1 month to be reassessed. If she continues to measure outside the recommended range, physical therapy treatment will be recommended at that time and a referral requested.    Otelia Limes, PTA 01/31/2022, 4:59 PM

## 2022-02-03 DIAGNOSIS — M19071 Primary osteoarthritis, right ankle and foot: Secondary | ICD-10-CM | POA: Diagnosis not present

## 2022-02-08 ENCOUNTER — Encounter: Payer: Self-pay | Admitting: Hematology & Oncology

## 2022-02-08 ENCOUNTER — Telehealth: Payer: Self-pay | Admitting: *Deleted

## 2022-02-08 ENCOUNTER — Inpatient Hospital Stay: Payer: BC Managed Care – PPO | Attending: Hematology & Oncology

## 2022-02-08 ENCOUNTER — Inpatient Hospital Stay (HOSPITAL_BASED_OUTPATIENT_CLINIC_OR_DEPARTMENT_OTHER): Payer: BC Managed Care – PPO | Admitting: Hematology & Oncology

## 2022-02-08 VITALS — BP 152/67 | HR 80 | Temp 98.0°F | Resp 20 | Wt 197.1 lb

## 2022-02-08 DIAGNOSIS — R0602 Shortness of breath: Secondary | ICD-10-CM | POA: Diagnosis not present

## 2022-02-08 DIAGNOSIS — D510 Vitamin B12 deficiency anemia due to intrinsic factor deficiency: Secondary | ICD-10-CM | POA: Insufficient documentation

## 2022-02-08 DIAGNOSIS — Z170 Estrogen receptor positive status [ER+]: Secondary | ICD-10-CM | POA: Diagnosis not present

## 2022-02-08 DIAGNOSIS — M791 Myalgia, unspecified site: Secondary | ICD-10-CM | POA: Diagnosis not present

## 2022-02-08 DIAGNOSIS — K5900 Constipation, unspecified: Secondary | ICD-10-CM | POA: Insufficient documentation

## 2022-02-08 DIAGNOSIS — Z79899 Other long term (current) drug therapy: Secondary | ICD-10-CM | POA: Diagnosis not present

## 2022-02-08 DIAGNOSIS — D472 Monoclonal gammopathy: Secondary | ICD-10-CM | POA: Diagnosis not present

## 2022-02-08 DIAGNOSIS — E1140 Type 2 diabetes mellitus with diabetic neuropathy, unspecified: Secondary | ICD-10-CM | POA: Insufficient documentation

## 2022-02-08 DIAGNOSIS — Z923 Personal history of irradiation: Secondary | ICD-10-CM | POA: Insufficient documentation

## 2022-02-08 DIAGNOSIS — Z79811 Long term (current) use of aromatase inhibitors: Secondary | ICD-10-CM | POA: Insufficient documentation

## 2022-02-08 DIAGNOSIS — C50412 Malignant neoplasm of upper-outer quadrant of left female breast: Secondary | ICD-10-CM

## 2022-02-08 DIAGNOSIS — M7989 Other specified soft tissue disorders: Secondary | ICD-10-CM | POA: Diagnosis not present

## 2022-02-08 DIAGNOSIS — R002 Palpitations: Secondary | ICD-10-CM | POA: Insufficient documentation

## 2022-02-08 DIAGNOSIS — M2550 Pain in unspecified joint: Secondary | ICD-10-CM | POA: Diagnosis not present

## 2022-02-08 DIAGNOSIS — R5383 Other fatigue: Secondary | ICD-10-CM | POA: Diagnosis not present

## 2022-02-08 DIAGNOSIS — R12 Heartburn: Secondary | ICD-10-CM | POA: Insufficient documentation

## 2022-02-08 LAB — CBC WITH DIFFERENTIAL (CANCER CENTER ONLY)
Abs Immature Granulocytes: 0.03 10*3/uL (ref 0.00–0.07)
Basophils Absolute: 0.1 10*3/uL (ref 0.0–0.1)
Basophils Relative: 1 %
Eosinophils Absolute: 0.4 10*3/uL (ref 0.0–0.5)
Eosinophils Relative: 4 %
HCT: 41.3 % (ref 36.0–46.0)
Hemoglobin: 13.5 g/dL (ref 12.0–15.0)
Immature Granulocytes: 0 %
Lymphocytes Relative: 32 %
Lymphs Abs: 3.1 10*3/uL (ref 0.7–4.0)
MCH: 28.9 pg (ref 26.0–34.0)
MCHC: 32.7 g/dL (ref 30.0–36.0)
MCV: 88.4 fL (ref 80.0–100.0)
Monocytes Absolute: 0.6 10*3/uL (ref 0.1–1.0)
Monocytes Relative: 6 %
Neutro Abs: 5.3 10*3/uL (ref 1.7–7.7)
Neutrophils Relative %: 57 %
Platelet Count: 382 10*3/uL (ref 150–400)
RBC: 4.67 MIL/uL (ref 3.87–5.11)
RDW: 12.4 % (ref 11.5–15.5)
WBC Count: 9.5 10*3/uL (ref 4.0–10.5)
nRBC: 0 % (ref 0.0–0.2)

## 2022-02-08 LAB — CMP (CANCER CENTER ONLY)
ALT: 14 U/L (ref 0–44)
AST: 16 U/L (ref 15–41)
Albumin: 4.5 g/dL (ref 3.5–5.0)
Alkaline Phosphatase: 51 U/L (ref 38–126)
Anion gap: 8 (ref 5–15)
BUN: 16 mg/dL (ref 8–23)
CO2: 31 mmol/L (ref 22–32)
Calcium: 9.8 mg/dL (ref 8.9–10.3)
Chloride: 99 mmol/L (ref 98–111)
Creatinine: 0.9 mg/dL (ref 0.44–1.00)
GFR, Estimated: >60 mL/min (ref >60)
Glucose, Bld: 205 mg/dL — ABNORMAL HIGH (ref 70–99)
Potassium: 3.3 mmol/L — ABNORMAL LOW (ref 3.5–5.1)
Sodium: 138 mmol/L (ref 135–145)
Total Bilirubin: 0.7 mg/dL (ref 0.3–1.2)
Total Protein: 7.9 g/dL (ref 6.5–8.1)

## 2022-02-08 LAB — VITAMIN B12: Vitamin B-12: 220 pg/mL (ref 180–914)

## 2022-02-08 LAB — LACTATE DEHYDROGENASE: LDH: 170 U/L (ref 98–192)

## 2022-02-08 NOTE — Progress Notes (Signed)
Hematology and Oncology Follow Up Visit  [name] [id number] 04-23-1951 71 y.o. 02/08/2022   Principle Diagnosis:  1. Stage IA (T1cN0M0) infiltrating ductal carcinoma of the LEFT breast -- ER+/PR+/HER2- -- Oncotype = 16 2. IgG Kappa smoldering myeloma 3. Chronic neuropathy 4. Pernicious Anemia  Current Therapy:   Velcade q week dosing (3/1) -- s/p cycle #2 -- d/c on 02/06/2018 for lack of effectiveness Vit B12 1000 mcg IM q month - start on 03/25/2020 S/p XRT to the LEFT breast -- completed on 01/01/2021 Femara 2.5 mg po q day -- start on 01/19/2021  Interim History:  [name] is here today for follow-up.  We last saw her back in December.  Since then, she has been doing about the same.  She is on Femara.  She is doing okay on the Femara.  She says she has had occasional pain in the left lower leg.  This is the lateral aspect of the left lower leg.  There is no swelling.  The pain comes and goes.  I will know if this might be neuropathy from the diabetes.  She also has a little swelling in the right foot.  Again this is chronic.  She is on vitamin B12 injections.  She has had no cough or shortness of breath.  She has had no change in bowel or bladder habits.  Back in December, her monoclonal spike was 0.7 g/dL.  Her IgG level was 1550 mg/dL.  The Kappa light chain was 3.6 mg/dL.  She has had no rashes.  She has had no fever.  Thankfully, there is no problems with COVID.  Overall, I would say performance status is probably ECOG 1.   Medications:  Allergies as of 02/08/2022       Reactions   Dristan [pheniramine-phenylephrine] Palpitations   Other Palpitations   Contact "old cold medicine        Medication List        Accurate as of Feb 08, 2022 12:54 PM. If you have any questions, ask your nurse or doctor.          STOP taking these medications    Accu-Chek Softclix Lancets lancets Stopped by: [name], MD   amitriptyline 10 MG  tablet Commonly known as: ELAVIL Stopped by: [name], MD   Diovan HCT 160-12.5 MG tablet Generic drug: valsartan-hydrochlorothiazide Stopped by: [name], MD   famciclovir 500 MG tablet Commonly known as: FAMVIR Stopped by: [name], MD   FreeStyle Libre 14 Day Sensor Misc Stopped by: [name], MD   FreeStyle Libre Reader Kerrin Mo Stopped by: [name], MD   gabapentin 300 MG capsule Commonly known as: NEURONTIN Stopped by: [name], MD   meclizine 25 MG tablet Commonly known as: ANTIVERT Stopped by: [name], MD   terbinafine 250 MG tablet Commonly known as: LAMISIL Stopped by: [name], MD       TAKE these medications    albuterol 108 (90 Base) MCG/ACT inhaler Commonly known as: VENTOLIN HFA Inhale into the lungs every 6 (six) hours as needed for wheezing or shortness of breath.   cyclobenzaprine 5 MG tablet Commonly known as: FLEXERIL Take 5 mg by mouth 3 (three) times daily as needed.   HYDROcodone-acetaminophen 10-325 MG tablet Commonly known as: NORCO   letrozole 2.5 MG tablet Commonly known as: FEMARA TAKE 1 TABLET BY MOUTH EVERY DAY   metFORMIN 500 MG 24 hr tablet Commonly  known as: GLUCOPHAGE-XR Take 1,000 mg by mouth 2 (two) times daily.   metoprolol succinate 100 MG 24 hr tablet Commonly known as: TOPROL-XL Take 100 mg by mouth daily.   morphine 60 MG 12 hr tablet Commonly known as: MS CONTIN Take 60 mg by mouth every 12 (twelve) hours.   pravastatin 40 MG tablet Commonly known as: PRAVACHOL Take 40 mg by mouth daily.   venlafaxine XR 150 MG 24 hr capsule Commonly known as: EFFEXOR-XR Take 150 mg by mouth daily with breakfast.   Vitamin D (Ergocalciferol) 1.25 MG (50000 UNIT) Caps capsule Commonly known as: DRISDOL TAKE 1 CAPSULE BY MOUTH ONE TIME PER WEEK        Allergies:  Allergies  Allergen Reactions   Dristan [Pheniramine-Phenylephrine] Palpitations   Other  Palpitations    Contact "old cold medicine    Past Medical History, Surgical history, Social history, and Family History were reviewed and updated.  Review of Systems: Review of Systems  Constitutional:  Positive for malaise/fatigue.  HENT: Negative.    Eyes: Negative.   Respiratory:  Positive for shortness of breath.   Cardiovascular:  Positive for palpitations and leg swelling.  Gastrointestinal:  Positive for constipation and heartburn.  Genitourinary: Negative.   Musculoskeletal:  Positive for joint pain and myalgias.  Skin: Negative.   Neurological:  Positive for tingling.  Endo/Heme/Allergies: Negative.   Psychiatric/Behavioral: Negative.      Physical Exam:  weight is 197 lb 1.3 oz (89.4 kg). Her oral temperature is 98 F (36.7 C). Her blood pressure is 152/67 (abnormal) and her pulse is 80. Her respiration is 20 and oxygen saturation is 96%.   Wt Readings from Last 3 Encounters:  02/08/22 197 lb 1.3 oz (89.4 kg)  01/31/22 199 lb (90.3 kg)  11/08/21 197 lb (89.4 kg)  Is 1 weight 1 1% patient is no  Physical Exam Vitals reviewed.  Constitutional:      Comments: On her breast exam, the right breast shows the biopsy site to be well-healed.  There is no distinct mass in the right breast.  There is no right axillary adenopathy.  Left breast shows the lumpectomy scar at the 1 o'clock position.  She has some slight erythema on the left breast from radiation.  There is no actual skin breakdown.  This is at the edge of the areola.  She has a well healing lymphadenectomy scar in the left axilla.  There is no axillary adenopathy in the left axilla.  HENT:     Head: Normocephalic and atraumatic.  Eyes:     Pupils: Pupils are equal, round, and reactive to light.  Cardiovascular:     Rate and Rhythm: Normal rate and regular rhythm.     Heart sounds: Normal heart sounds.  Pulmonary:     Effort: Pulmonary effort is normal.     Breath sounds: Normal breath sounds.  Abdominal:      General: Bowel sounds are normal.     Palpations: Abdomen is soft.  Musculoskeletal:        General: No tenderness or deformity. Normal range of motion.     Cervical back: Normal range of motion.     Comments: On her left arm, she has a compression sleeve.  There is some edema.  She has decent range of motion of her joints.    Lymphadenopathy:     Cervical: No cervical adenopathy.  Skin:    General: Skin is warm and dry.     Findings: No  erythema or rash.  Neurological:     Mental Status: She is alert and oriented to person, place, and time.  Psychiatric:        Behavior: Behavior normal.        Thought Content: Thought content normal.        Judgment: Judgment normal.    Lab Results  Component Value Date   WBC 9.5 02/08/2022   HGB 13.5 02/08/2022   HCT 41.3 02/08/2022   MCV 88.4 02/08/2022   PLT 382 02/08/2022   Lab Results  Component Value Date   FERRITIN 55 10/03/2019   IRON 68 10/03/2019   TIBC 389 10/03/2019   UIBC 321 10/03/2019   IRONPCTSAT 17 (L) 10/03/2019   Lab Results  Component Value Date   RBC 4.67 02/08/2022   Lab Results  Component Value Date   KPAFRELGTCHN 35.6 (H) 09/07/2021   LAMBDASER 28.0 (H) 09/07/2021   KAPLAMBRATIO 1.27 09/07/2021   Lab Results  Component Value Date   IGGSERUM 1,520 09/07/2021   IGGSERUM 1,577 09/07/2021   IGA 61 (L) 09/07/2021   IGA 60 (L) 09/07/2021   IGMSERUM 43 09/07/2021   IGMSERUM 43 09/07/2021   Lab Results  Component Value Date   TOTALPROTELP 7.3 09/07/2021   ALBUMINELP 3.9 09/07/2021   A1GS 0.2 09/07/2021   A2GS 0.8 09/07/2021   BETS 1.1 09/07/2021   BETA2SER 0.3 02/20/2015   GAMS 1.3 09/07/2021   MSPIKE 0.7 (H) 09/07/2021   SPEI * 02/20/2015     Chemistry      Component Value Date/Time   NA 138 02/08/2022 1144   NA 139 03/23/2020 1220   NA 140 03/27/2017 0804   K 3.3 (L) 02/08/2022 1144   K 3.7 03/27/2017 0804   CL 99 02/08/2022 1144   CL 94 (L) 02/20/2015 1010   CO2 31 02/08/2022 1144   CO2  28 03/27/2017 0804   BUN 16 02/08/2022 1144   BUN 18 03/23/2020 1220   BUN 14.9 03/27/2017 0804   CREATININE 0.90 02/08/2022 1144   CREATININE 0.9 03/27/2017 0804      Component Value Date/Time   CALCIUM 9.8 02/08/2022 1144   CALCIUM 10.0 03/27/2017 0804   ALKPHOS 51 02/08/2022 1144   ALKPHOS 69 03/27/2017 0804   AST 16 02/08/2022 1144   AST 26 03/27/2017 0804   ALT 14 02/08/2022 1144   ALT 28 03/27/2017 0804   BILITOT 0.7 02/08/2022 1144   BILITOT 0.76 03/27/2017 0804     Impression and Plan: [name] is 71 yo postmenopausal white female with smoldering myeloma and also chronic neuropathy due to diabetes and chronic back issues.   She has a stage Ia ductal carcinoma of the left breast.  She underwent a lumpectomy.  She has a low Oncotype score.    She is doing well on the Femara.  She has hot flashes and sweats.  The Femara may be making these a bit more prominent.  I just do not think that breast cancer recurrence again be a real issue for her.  So not sure what might be going on with this pain.  I cannot find anything on physical exam.  Again it might be neuropathy.  I do not think we need any scans.  Her myeloma seems to be doing pretty well.  We will see what her levels are.  I think we can now get her through the Summer.  I will plan to get her back after Labor Day holiday.  [name], MD 5/30/202312:54 PM

## 2022-02-08 NOTE — Telephone Encounter (Signed)
Per 02/08/22 los - gave upcoming appointments - confirmed 

## 2022-02-09 LAB — IGG, IGA, IGM
IgA: 62 mg/dL — ABNORMAL LOW (ref 87–352)
IgG (Immunoglobin G), Serum: 1519 mg/dL (ref 586–1602)
IgM (Immunoglobulin M), Srm: 54 mg/dL (ref 26–217)

## 2022-02-09 LAB — KAPPA/LAMBDA LIGHT CHAINS
Kappa free light chain: 44.9 mg/L — ABNORMAL HIGH (ref 3.3–19.4)
Kappa, lambda light chain ratio: 1.65 (ref 0.26–1.65)
Lambda free light chains: 27.2 mg/L — ABNORMAL HIGH (ref 5.7–26.3)

## 2022-02-14 LAB — PROTEIN ELECTROPHORESIS, SERUM, WITH REFLEX
A/G Ratio: 1.1 (ref 0.7–1.7)
Albumin ELP: 3.7 g/dL (ref 2.9–4.4)
Alpha-1-Globulin: 0.2 g/dL (ref 0.0–0.4)
Alpha-2-Globulin: 0.8 g/dL (ref 0.4–1.0)
Beta Globulin: 1.1 g/dL (ref 0.7–1.3)
Gamma Globulin: 1.3 g/dL (ref 0.4–1.8)
Globulin, Total: 3.5 g/dL (ref 2.2–3.9)
M-Spike, %: 0.9 g/dL — ABNORMAL HIGH
SPEP Interpretation: 0
Total Protein ELP: 7.2 g/dL (ref 6.0–8.5)

## 2022-02-14 LAB — IMMUNOFIXATION REFLEX, SERUM
IgA: 63 mg/dL — ABNORMAL LOW (ref 87–352)
IgG (Immunoglobin G), Serum: 1629 mg/dL — ABNORMAL HIGH (ref 586–1602)
IgM (Immunoglobulin M), Srm: 51 mg/dL (ref 26–217)

## 2022-02-16 DIAGNOSIS — Z79891 Long term (current) use of opiate analgesic: Secondary | ICD-10-CM | POA: Diagnosis not present

## 2022-02-16 DIAGNOSIS — G894 Chronic pain syndrome: Secondary | ICD-10-CM | POA: Diagnosis not present

## 2022-02-16 DIAGNOSIS — E1142 Type 2 diabetes mellitus with diabetic polyneuropathy: Secondary | ICD-10-CM | POA: Diagnosis not present

## 2022-02-16 DIAGNOSIS — M961 Postlaminectomy syndrome, not elsewhere classified: Secondary | ICD-10-CM | POA: Diagnosis not present

## 2022-03-02 ENCOUNTER — Encounter: Payer: Self-pay | Admitting: *Deleted

## 2022-03-02 NOTE — Progress Notes (Signed)
Patient calls with c/o thinning hair while on femara. She wants to know if there is anything she can do to treat or prevent this.  Spoke to Dr Marin Olp. There is no concrete treatment however there may be some benefit to taking Biotin daily. This is shared with patient.   Oncology Nurse Navigator Documentation     03/02/2022   12:30 PM  Oncology Nurse Navigator Flowsheets  Navigator Location [facility]  Navigator Encounter Type Telephone  Telephone Symptom Mgt;Incoming Call  Patient Visit Type MedOnc  Treatment Phase Active Tx  Barriers/Navigation Needs Education  Education Pain/ Symptom Management  Interventions Education  Acuity Level 1-No Barriers  Education Method Verbal  Support Groups/Services Friends and Family  Time Spent with Patient 15

## 2022-03-03 ENCOUNTER — Encounter: Payer: Self-pay | Admitting: *Deleted

## 2022-03-03 ENCOUNTER — Encounter (INDEPENDENT_AMBULATORY_CARE_PROVIDER_SITE_OTHER): Payer: Self-pay

## 2022-03-07 ENCOUNTER — Ambulatory Visit: Payer: BC Managed Care – PPO | Attending: General Surgery

## 2022-03-07 VITALS — Wt 199.4 lb

## 2022-03-07 DIAGNOSIS — Z483 Aftercare following surgery for neoplasm: Secondary | ICD-10-CM | POA: Insufficient documentation

## 2022-03-28 DIAGNOSIS — E1140 Type 2 diabetes mellitus with diabetic neuropathy, unspecified: Secondary | ICD-10-CM | POA: Diagnosis not present

## 2022-04-13 DIAGNOSIS — M961 Postlaminectomy syndrome, not elsewhere classified: Secondary | ICD-10-CM | POA: Diagnosis not present

## 2022-04-13 DIAGNOSIS — Z79891 Long term (current) use of opiate analgesic: Secondary | ICD-10-CM | POA: Diagnosis not present

## 2022-04-13 DIAGNOSIS — E1142 Type 2 diabetes mellitus with diabetic polyneuropathy: Secondary | ICD-10-CM | POA: Diagnosis not present

## 2022-04-13 DIAGNOSIS — G894 Chronic pain syndrome: Secondary | ICD-10-CM | POA: Diagnosis not present

## 2022-04-27 DIAGNOSIS — E1140 Type 2 diabetes mellitus with diabetic neuropathy, unspecified: Secondary | ICD-10-CM | POA: Diagnosis not present

## 2022-05-02 ENCOUNTER — Ambulatory Visit: Payer: BC Managed Care – PPO | Attending: General Surgery | Admitting: Rehabilitation

## 2022-05-02 DIAGNOSIS — Z483 Aftercare following surgery for neoplasm: Secondary | ICD-10-CM | POA: Insufficient documentation

## 2022-05-02 NOTE — Therapy (Signed)
OUTPATIENT PHYSICAL THERAPY [facility] SCREENING NOTE   Patient Name: [name] MRN: [id number] DOB:[birth year], 71 y.o., female Today's Date: 05/02/2022  PCP: Carol Ada, MD REFERRING PROVIDER: Jovita Kussmaul, MD   PT End of Session - 05/02/22 1700     Visit Number 9   screen only   PT Start Time 1650    PT Stop Time 1655    PT Time Calculation (min) 5 min    Activity Tolerance Patient tolerated treatment well    Behavior During Therapy [facility] for tasks assessed/performed             Past Medical History:  Diagnosis Date   Allergies    Anxiety    Asthma    treated by pulmonologist    Atherosclerosis of aorta ([place])    noted on xray   Breast cancer ([place])    Right   Chronic pain    Depression    Diabetes ([place])    type II   Edema    Goals of care, counseling/discussion 09/02/2020   High cholesterol    History of nuclear stress test 11/12/2009   normal    Hypertension    IgG monoclonal gammopathy of uncertain significance    igG kappa monoclonal gammopathy of unkown significance (per notes from [employer]).   Insomnia    Low back pain    Migraine headache    Neuropathy associated with MGUS ([facility])    Peripheral neuropathy    Pernicious anemia 05/20/2021   PONV (postoperative nausea and vomiting)    hard to wake up   Sleep apnea    Smoldering multiple myeloma 12/04/2017   Vertigo    Past Surgical History:  Procedure Laterality Date   BACK SURGERY  2019   T10-S1 Fusion Dr. Hulda Marin SURGERY  2019   2 fusion in lower back (L4-S1) and neck   breast cyst removal age 50     BREAST LUMPECTOMY WITH RADIOACTIVE SEED AND SENTINEL LYMPH NODE BIOPSY Left 09/30/2020   Procedure: RADIOACTIVE SEED GUIDED LEFT BREAST LUMPECTOMY, LEFT AXILLARY SENTINEL LYMPH NODE BIOPSY;  Surgeon: Jovita Kussmaul, MD;  Location: [place];  Service: General;  Laterality: Left;   CESAREAN SECTION     x2   hernia repair at 74 months old     miscarriage D&C      NASAL SINUS SURGERY     x2   NECK SURGERY     pain stimulator in back     RE-EXCISION OF BREAST CANCER,SUPERIOR MARGINS Left 10/19/2020   Procedure: RE-EXCISION LEFT BREAST MARGINS;  Surgeon: Jovita Kussmaul, MD;  Location: [place];  Service: General;  Laterality: Left;   TONSILLECTOMY     Patient Active Problem List   Diagnosis Date Noted   Pernicious anemia 05/20/2021   Goals of care, counseling/discussion 09/02/2020   Malignant neoplasm of upper-outer quadrant of left breast in female, estrogen receptor positive ([place]) 08/27/2020   Iron deficiency anemia 10/09/2019   Impaired mobility and activities of daily living 08/30/2018   Multiple myeloma ([place]) 08/30/2018   S/P spinal fusion 08/30/2018   Lumbar degenerative disc disease 08/27/2018   History of degenerative disc disease 08/15/2018   Neck pain 08/15/2018   Sensorineural hearing loss (SNHL), bilateral 08/15/2018   Tinnitus of both ears 08/15/2018   Smoldering multiple myeloma 12/04/2017   Breakdown (mechanical) of implanted electronic neurostimulator, generator, initial encounter ([place]) 08/12/2016   Myalgia 08/12/2016   Post laminectomy syndrome 08/12/2016  Migraine with aura and without status migrainosus, not intractable 10/15/2015   Intrinsic asthma 01/11/2010   Dyspnea 12/14/2009   DIABETES MELLITUS, TYPE II 12/11/2009   Hyperlipidemia 12/11/2009   MIGRAINE HEADACHE 12/11/2009   Hereditary and idiopathic peripheral neuropathy 12/11/2009   Hypertension 12/11/2009   Insomnia 12/11/2009   EDEMA 12/11/2009   Type 2 diabetes mellitus with diabetic polyneuropathy ([place]) 12/11/2009    REFERRING DIAG: left breast cancer at risk for lymphedema  THERAPY DIAG:  Aftercare following surgery for neoplasm  PERTINENT HISTORY: Recent diagnosis of Lt breast cancer IDC stage IA with surgery 09/30/20 with 3 lymph nodes removed and then re-excision 10/19/20.  pt is followed by Dr. Marin Olp due to smoldering igG Kappa  myeloma.  Other history includes: DM, Peripheral neuropathy, and HTN, cervical fusion with limited movement and back T10-S1, vertigo, recent fall with hand fracture   PRECAUTIONS: left UE Lymphedema risk, None  SUBJECTIVE: Pt returns for her 3 month L-Dex screen.   PAIN:  Are you having pain? No  SOZO SCREENING: Patient was assessed today using the SOZO machine to determine the lymphedema index score. This was compared to her baseline score. It was determined that she is within the recommended range when compared to her baseline and no further action is needed at this time. She will continue SOZO screenings. These are done every 3 months for 2 years post operatively followed by every 6 months for 2 years, and then annually.    Stark Bray, PT 05/02/2022, 5:01 PM

## 2022-05-03 DIAGNOSIS — B351 Tinea unguium: Secondary | ICD-10-CM | POA: Diagnosis not present

## 2022-05-03 DIAGNOSIS — E1142 Type 2 diabetes mellitus with diabetic polyneuropathy: Secondary | ICD-10-CM | POA: Diagnosis not present

## 2022-05-12 DIAGNOSIS — R051 Acute cough: Secondary | ICD-10-CM | POA: Diagnosis not present

## 2022-05-12 DIAGNOSIS — R6883 Chills (without fever): Secondary | ICD-10-CM | POA: Diagnosis not present

## 2022-05-12 DIAGNOSIS — U071 COVID-19: Secondary | ICD-10-CM | POA: Diagnosis not present

## 2022-05-12 DIAGNOSIS — R5383 Other fatigue: Secondary | ICD-10-CM | POA: Diagnosis not present

## 2022-05-27 DIAGNOSIS — E1140 Type 2 diabetes mellitus with diabetic neuropathy, unspecified: Secondary | ICD-10-CM | POA: Diagnosis not present

## 2022-06-08 ENCOUNTER — Ambulatory Visit: Payer: BC Managed Care – PPO | Admitting: Hematology & Oncology

## 2022-06-08 ENCOUNTER — Other Ambulatory Visit: Payer: BC Managed Care – PPO

## 2022-06-15 ENCOUNTER — Encounter: Payer: Self-pay | Admitting: Family

## 2022-06-15 ENCOUNTER — Inpatient Hospital Stay (HOSPITAL_BASED_OUTPATIENT_CLINIC_OR_DEPARTMENT_OTHER): Payer: BC Managed Care – PPO | Admitting: Family

## 2022-06-15 ENCOUNTER — Inpatient Hospital Stay: Payer: BC Managed Care – PPO | Attending: Hematology & Oncology

## 2022-06-15 VITALS — BP 153/73 | HR 92 | Temp 98.5°F | Resp 17 | Wt 199.0 lb

## 2022-06-15 DIAGNOSIS — R232 Flushing: Secondary | ICD-10-CM | POA: Insufficient documentation

## 2022-06-15 DIAGNOSIS — C50912 Malignant neoplasm of unspecified site of left female breast: Secondary | ICD-10-CM | POA: Insufficient documentation

## 2022-06-15 DIAGNOSIS — D472 Monoclonal gammopathy: Secondary | ICD-10-CM

## 2022-06-15 DIAGNOSIS — Z170 Estrogen receptor positive status [ER+]: Secondary | ICD-10-CM | POA: Insufficient documentation

## 2022-06-15 DIAGNOSIS — C50419 Malignant neoplasm of upper-outer quadrant of unspecified female breast: Secondary | ICD-10-CM

## 2022-06-15 DIAGNOSIS — D510 Vitamin B12 deficiency anemia due to intrinsic factor deficiency: Secondary | ICD-10-CM | POA: Diagnosis not present

## 2022-06-15 DIAGNOSIS — Z79899 Other long term (current) drug therapy: Secondary | ICD-10-CM | POA: Insufficient documentation

## 2022-06-15 DIAGNOSIS — I890 Lymphedema, not elsewhere classified: Secondary | ICD-10-CM | POA: Diagnosis not present

## 2022-06-15 DIAGNOSIS — E1140 Type 2 diabetes mellitus with diabetic neuropathy, unspecified: Secondary | ICD-10-CM | POA: Diagnosis not present

## 2022-06-15 LAB — CBC WITH DIFFERENTIAL (CANCER CENTER ONLY)
Abs Immature Granulocytes: 0.03 10*3/uL (ref 0.00–0.07)
Basophils Absolute: 0.1 10*3/uL (ref 0.0–0.1)
Basophils Relative: 1 %
Eosinophils Absolute: 0.5 10*3/uL (ref 0.0–0.5)
Eosinophils Relative: 6 %
HCT: 40 % (ref 36.0–46.0)
Hemoglobin: 12.6 g/dL (ref 12.0–15.0)
Immature Granulocytes: 0 %
Lymphocytes Relative: 33 %
Lymphs Abs: 2.8 10*3/uL (ref 0.7–4.0)
MCH: 28.1 pg (ref 26.0–34.0)
MCHC: 31.5 g/dL (ref 30.0–36.0)
MCV: 89.1 fL (ref 80.0–100.0)
Monocytes Absolute: 0.7 10*3/uL (ref 0.1–1.0)
Monocytes Relative: 8 %
Neutro Abs: 4.4 10*3/uL (ref 1.7–7.7)
Neutrophils Relative %: 52 %
Platelet Count: 357 10*3/uL (ref 150–400)
RBC: 4.49 MIL/uL (ref 3.87–5.11)
RDW: 12.9 % (ref 11.5–15.5)
WBC Count: 8.5 10*3/uL (ref 4.0–10.5)
nRBC: 0 % (ref 0.0–0.2)

## 2022-06-15 LAB — CMP (CANCER CENTER ONLY)
ALT: 14 U/L (ref 0–44)
AST: 17 U/L (ref 15–41)
Albumin: 4.4 g/dL (ref 3.5–5.0)
Alkaline Phosphatase: 55 U/L (ref 38–126)
Anion gap: 8 (ref 5–15)
BUN: 13 mg/dL (ref 8–23)
CO2: 31 mmol/L (ref 22–32)
Calcium: 10 mg/dL (ref 8.9–10.3)
Chloride: 100 mmol/L (ref 98–111)
Creatinine: 0.76 mg/dL (ref 0.44–1.00)
GFR, Estimated: >60 mL/min (ref >60)
Glucose, Bld: 127 mg/dL — ABNORMAL HIGH (ref 70–99)
Potassium: 3.5 mmol/L (ref 3.5–5.1)
Sodium: 139 mmol/L (ref 135–145)
Total Bilirubin: 0.5 mg/dL (ref 0.3–1.2)
Total Protein: 8.1 g/dL (ref 6.5–8.1)

## 2022-06-15 LAB — LACTATE DEHYDROGENASE: LDH: 158 U/L (ref 98–192)

## 2022-06-15 NOTE — Progress Notes (Signed)
Hematology and Oncology Follow Up Visit  [name] [id number] Dec 10, 1950 71 y.o. 06/15/2022   Principle Diagnosis:  1. Stage IA (T1cN0M0) infiltrating ductal carcinoma of the LEFT breast -- ER+/PR+/HER2- -- Oncotype = 16 2. IgG Kappa smoldering myeloma 3. Chronic neuropathy 4. Pernicious Anemia  Past Therapy: Velcade q week dosing (3/1) -- s/p cycle #2 -- d/c on 02/06/2018 for lack of effectiveness S/p XRT to the LEFT breast -- completed on 01/01/2021  Current Therapy:       Femara 2.5 mg po q day -- start on 01/19/2021 Vit B12 1000 mcg IM q month - start on 03/25/2020   Interim History:  [name] is here today with [name] husband for follow-up. [name] is doing well.  On left breast exam [name] had some discomfort of the areola which [name] states [name] can be sensitive. No mass, lesion, rash or discharge noted. Right breast exam negative.  No adenopathy noted. [name] has minimal lymphedema in the left arm and plans to put [name] compression sleeve back on.  [name] is doing well on Femara. Hot flashes seem to be a little better but still present.  Back in May, M-spike was 0.9 g/dL, IgG level 1,519 mg/dL and kappa light chains 4.49 mg/dL.  [name] states that [name] had Covid along with several other family members at the end of August. Since then [name] has had some fatigue, loss of appetite, dizziness/vertigo increased arthralgias and mild SOB with exertion.  No fever, chills, n/v, cough, rash, chest pain, palpitations, abdominal pain or changes in bowel or bladder habits.  Neuropathy in [name] lower extremities unchanged from baseline.  No falls or syncope reported. [name] will work on hydrating better. [name] has been supplementing [name] decreased appetite with Glucerna as needed. Weight is 199 lbs.   ECOG Performance Status: 0 - Asymptomatic  Medications:  Allergies as of 06/15/2022       Reactions   Dristan [pheniramine-phenylephrine] Palpitations   Other Palpitations   Contact "old cold medicine         Medication List        Accurate as of June 15, 2022  1:53 PM. If you have any questions, ask your nurse or doctor.          albuterol 108 (90 Base) MCG/ACT inhaler Commonly known as: VENTOLIN HFA Inhale into the lungs every 6 (six) hours as needed for wheezing or shortness of breath.   cyclobenzaprine 5 MG tablet Commonly known as: FLEXERIL Take 5 mg by mouth 3 (three) times daily as needed.   HYDROcodone-acetaminophen 10-325 MG tablet Commonly known as: NORCO   letrozole 2.5 MG tablet Commonly known as: FEMARA TAKE 1 TABLET BY MOUTH EVERY DAY   metFORMIN 500 MG 24 hr tablet Commonly known as: GLUCOPHAGE-XR Take 1,000 mg by mouth 2 (two) times daily.   metoprolol succinate 100 MG 24 hr tablet Commonly known as: TOPROL-XL Take 100 mg by mouth daily.   morphine 60 MG 12 hr tablet Commonly known as: MS CONTIN Take 60 mg by mouth every 12 (twelve) hours.   pravastatin 40 MG tablet Commonly known as: PRAVACHOL Take 40 mg by mouth daily.   venlafaxine XR 150 MG 24 hr capsule Commonly known as: EFFEXOR-XR Take 150 mg by mouth daily with breakfast.   Vitamin D (Ergocalciferol) 1.25 MG (50000 UNIT) Caps capsule Commonly known as: DRISDOL TAKE 1 CAPSULE BY MOUTH ONE TIME PER WEEK        Allergies:  Allergies  Allergen Reactions  Dristan [Pheniramine-Phenylephrine] Palpitations   Other Palpitations    Contact "old cold medicine    Past Medical History, Surgical history, Social history, and Family History were reviewed and updated.  Review of Systems: All other 10 point review of systems is negative.   Physical Exam:  weight is 199 lb (90.3 kg). [name] oral temperature is 98.5 F (36.9 C). [name] blood pressure is 153/73 (abnormal) and [name] pulse is 92. [name] respiration is 17 and oxygen saturation is 96%.   Wt Readings from Last 3 Encounters:  06/15/22 199 lb (90.3 kg)  03/07/22 199 lb 6 oz (90.4 kg)  02/08/22 197 lb 1.3 oz (89.4 kg)    Ocular:  Sclerae unicteric, pupils equal, round and reactive to light Ear-nose-throat: Oropharynx clear, dentition fair Lymphatic: No cervical or supraclavicular adenopathy Lungs no rales or rhonchi, good excursion bilaterally Heart regular rate and rhythm, no murmur appreciated Abd soft, nontender, positive bowel sounds MSK no focal spinal tenderness, no joint edema Neuro: non-focal, well-oriented, appropriate affect Breasts: Deferred   Lab Results  Component Value Date   WBC 8.5 06/15/2022   HGB 12.6 06/15/2022   HCT 40.0 06/15/2022   MCV 89.1 06/15/2022   PLT 357 06/15/2022   Lab Results  Component Value Date   FERRITIN 55 10/03/2019   IRON 68 10/03/2019   TIBC 389 10/03/2019   UIBC 321 10/03/2019   IRONPCTSAT 17 (L) 10/03/2019   Lab Results  Component Value Date   RBC 4.49 06/15/2022   Lab Results  Component Value Date   KPAFRELGTCHN 44.9 (H) 02/08/2022   LAMBDASER 27.2 (H) 02/08/2022   KAPLAMBRATIO 1.65 02/08/2022   Lab Results  Component Value Date   IGGSERUM 1,519 02/08/2022   IGGSERUM 1,629 (H) 02/08/2022   IGA 62 (L) 02/08/2022   IGA 63 (L) 02/08/2022   IGMSERUM 54 02/08/2022   IGMSERUM 51 02/08/2022   Lab Results  Component Value Date   TOTALPROTELP 7.2 02/08/2022   ALBUMINELP 3.7 02/08/2022   A1GS 0.2 02/08/2022   A2GS 0.8 02/08/2022   BETS 1.1 02/08/2022   BETA2SER 0.3 02/20/2015   GAMS 1.3 02/08/2022   MSPIKE 0.9 (H) 02/08/2022   SPEI * 02/20/2015     Chemistry      Component Value Date/Time   NA 138 02/08/2022 1144   NA 139 03/23/2020 1220   NA 140 03/27/2017 0804   K 3.3 (L) 02/08/2022 1144   K 3.7 03/27/2017 0804   CL 99 02/08/2022 1144   CL 94 (L) 02/20/2015 1010   CO2 31 02/08/2022 1144   CO2 28 03/27/2017 0804   BUN 16 02/08/2022 1144   BUN 18 03/23/2020 1220   BUN 14.9 03/27/2017 0804   CREATININE 0.90 02/08/2022 1144   CREATININE 0.9 03/27/2017 0804      Component Value Date/Time   CALCIUM 9.8 02/08/2022 1144   CALCIUM 10.0  03/27/2017 0804   ALKPHOS 51 02/08/2022 1144   ALKPHOS 69 03/27/2017 0804   AST 16 02/08/2022 1144   AST 26 03/27/2017 0804   ALT 14 02/08/2022 1144   ALT 28 03/27/2017 0804   BILITOT 0.7 02/08/2022 1144   BILITOT 0.76 03/27/2017 0804       Impression and Plan: [name] is 71 yo postmenopausal white female with history of stage Ia ductal carcinoma of the left breast with low Oncotype score.  [name] had lumpectomy. [name] completed 2 cycle of Velcade which was not effective.  [name] then completed radiation to the left breast.  [name]  is now on Femara and tolerating nicely. [name] will continue [name] same regimen.  [name] also has history or smoldering myeloma and also chronic neuropathy due to diabetes and chronic back issues.  Protein studies are pending.  [name] will contact our office is anything changes. Mammogram scheduled in January can be moved up if needed.  Follow-up in 4 months.   Lottie Dawson, NP 10/4/20231:53 PM

## 2022-06-16 DIAGNOSIS — G894 Chronic pain syndrome: Secondary | ICD-10-CM | POA: Diagnosis not present

## 2022-06-16 DIAGNOSIS — E1142 Type 2 diabetes mellitus with diabetic polyneuropathy: Secondary | ICD-10-CM | POA: Diagnosis not present

## 2022-06-16 DIAGNOSIS — Z79891 Long term (current) use of opiate analgesic: Secondary | ICD-10-CM | POA: Diagnosis not present

## 2022-06-16 DIAGNOSIS — M961 Postlaminectomy syndrome, not elsewhere classified: Secondary | ICD-10-CM | POA: Diagnosis not present

## 2022-06-16 LAB — KAPPA/LAMBDA LIGHT CHAINS
Kappa free light chain: 45.4 mg/L — ABNORMAL HIGH (ref 3.3–19.4)
Kappa, lambda light chain ratio: 1.38 (ref 0.26–1.65)
Lambda free light chains: 32.9 mg/L — ABNORMAL HIGH (ref 5.7–26.3)

## 2022-06-17 LAB — IGG, IGA, IGM
IgA: 61 mg/dL — ABNORMAL LOW (ref 87–352)
IgG (Immunoglobin G), Serum: 1544 mg/dL (ref 586–1602)
IgM (Immunoglobulin M), Srm: 52 mg/dL (ref 26–217)

## 2022-06-17 LAB — BETA 2 MICROGLOBULIN, SERUM: Beta-2 Microglobulin: 2.2 mg/L (ref 0.6–2.4)

## 2022-06-24 LAB — PROTEIN ELECTROPHORESIS, SERUM, WITH REFLEX
A/G Ratio: 1 (ref 0.7–1.7)
Albumin ELP: 3.7 g/dL (ref 2.9–4.4)
Alpha-1-Globulin: 0.2 g/dL (ref 0.0–0.4)
Alpha-2-Globulin: 0.8 g/dL (ref 0.4–1.0)
Beta Globulin: 1.2 g/dL (ref 0.7–1.3)
Gamma Globulin: 1.5 g/dL (ref 0.4–1.8)
Globulin, Total: 3.8 g/dL (ref 2.2–3.9)
M-Spike, %: 0.9 g/dL — ABNORMAL HIGH
SPEP Interpretation: 0
Total Protein ELP: 7.5 g/dL (ref 6.0–8.5)

## 2022-06-24 LAB — IMMUNOFIXATION REFLEX, SERUM
IgA: 62 mg/dL — ABNORMAL LOW (ref 87–352)
IgG (Immunoglobin G), Serum: 1765 mg/dL — ABNORMAL HIGH (ref 586–1602)
IgM (Immunoglobulin M), Srm: 49 mg/dL (ref 26–217)

## 2022-07-06 DIAGNOSIS — E782 Mixed hyperlipidemia: Secondary | ICD-10-CM | POA: Diagnosis not present

## 2022-07-06 DIAGNOSIS — I700 Atherosclerosis of aorta: Secondary | ICD-10-CM | POA: Diagnosis not present

## 2022-07-06 DIAGNOSIS — I10 Essential (primary) hypertension: Secondary | ICD-10-CM | POA: Diagnosis not present

## 2022-07-06 DIAGNOSIS — Z23 Encounter for immunization: Secondary | ICD-10-CM | POA: Diagnosis not present

## 2022-07-06 DIAGNOSIS — E1140 Type 2 diabetes mellitus with diabetic neuropathy, unspecified: Secondary | ICD-10-CM | POA: Diagnosis not present

## 2022-07-06 DIAGNOSIS — Z0000 Encounter for general adult medical examination without abnormal findings: Secondary | ICD-10-CM | POA: Diagnosis not present

## 2022-07-06 DIAGNOSIS — F411 Generalized anxiety disorder: Secondary | ICD-10-CM | POA: Diagnosis not present

## 2022-07-06 DIAGNOSIS — J4520 Mild intermittent asthma, uncomplicated: Secondary | ICD-10-CM | POA: Diagnosis not present

## 2022-07-12 ENCOUNTER — Encounter (HOSPITAL_BASED_OUTPATIENT_CLINIC_OR_DEPARTMENT_OTHER): Payer: BC Managed Care – PPO | Admitting: Advanced Practice Midwife

## 2022-07-29 IMAGING — MR MR HEAD WO/W CM
12 series · 48 of 48 positions shown · IV contrast (multihance)
Comparison: Head CT 01/19/2019.

CLINICAL DATA: Malignant neoplasm of upper-outer quadrant of breast
in female, estrogen receptor positive, unspecified laterality.
Breast cancer, staging; vertigo. Additional history provided by
scanning technologist: Patient reports recent diagnosis of breast
cancer, migraines with vertigo, fall with injury.

EXAM:
MRI HEAD WITHOUT AND WITH CONTRAST
TECHNIQUE: Multiplanar, multiecho pulse sequences of the brain and surrounding
structures were obtained without and with intravenous contrast.
CONTRAST:  19mL MULTIHANCE GADOBENATE DIMEGLUMINE 529 MG/ML IV SOLN

[Series 2: t1_se_sag · sagittal · 5.0mm · 0.45mm/px · 1 of 23 slices shown]
[im 1/23]
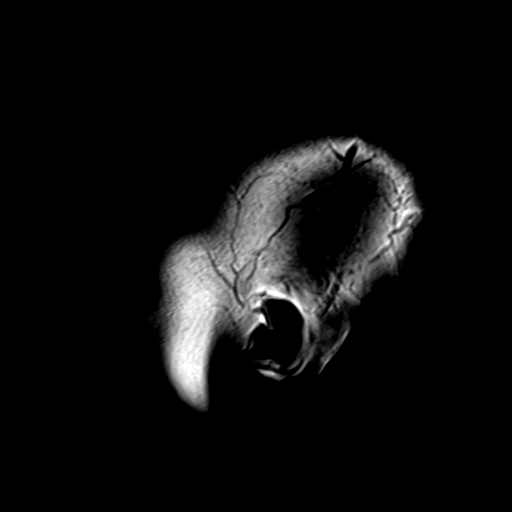

[Series 3: ep2d_diff_3 · axial · 3.0mm · 1.80mm/px · z∈[-15,+124]mm · 5 of 94 slices shown]
[im 1/94]
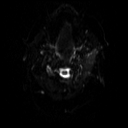
[im 24/94]
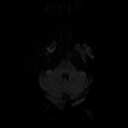
[im 47/94]
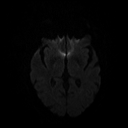
[im 70/94]
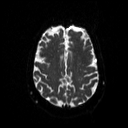
[im 94/94]
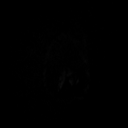

[Series 4: ep2d_diff_3_adc · axial · 3.0mm · 1.80mm/px · z∈[-15,+124]mm · 3 of 47 slices shown]
[im 1/47]
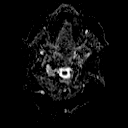
[im 24/47]
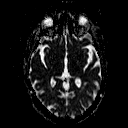
[im 47/47]
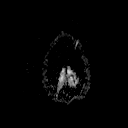

[Series 5: ep2d_diff_cor · coronal · 5.0mm · 1.77mm/px · 4 of 52 slices shown]
[im 1/52]
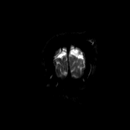
[im 18/52]
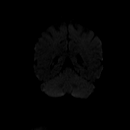
[im 35/52]
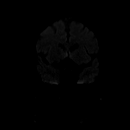
[im 52/52]
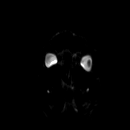

[Series 6: ep2d_diff_cor_adc · coronal · 5.0mm · 1.77mm/px · 2 of 27 slices shown]
[im 1/27]
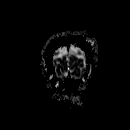
[im 27/27]
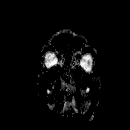

[Series 8: swi_images · axial · 2.0mm · 0.90mm/px · z∈[-17,+124]mm · 5 of 72 slices shown]
[im 1/72]
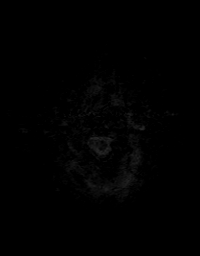
[im 18/72]
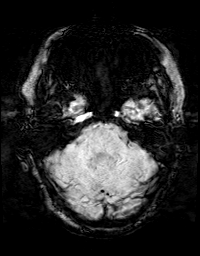
[im 36/72]
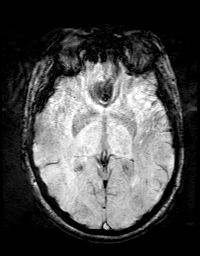
[im 54/72]
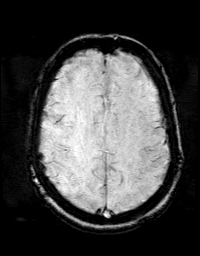
[im 72/72]
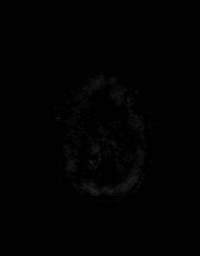

[Series 9: FLAIR · axial · 3.0mm · 0.43mm/px · z∈[-18,+125]mm · 2 of 25 slices shown]
[im 1/25]
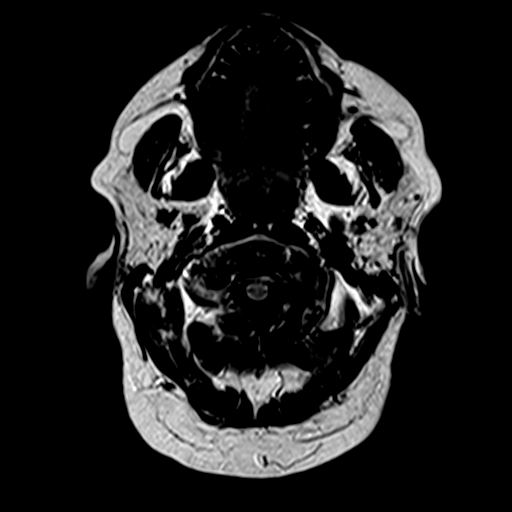
[im 25/25]
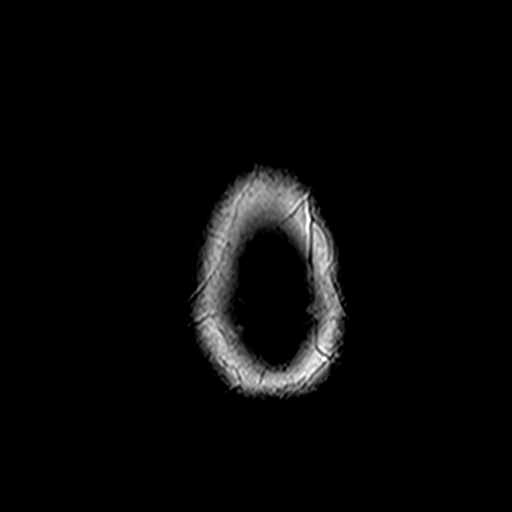

[Series 10: t2_tse_tra_512 · axial · 5.0mm · 0.72mm/px · z∈[-15,+122]mm · 2 of 24 slices shown]
[im 1/24]
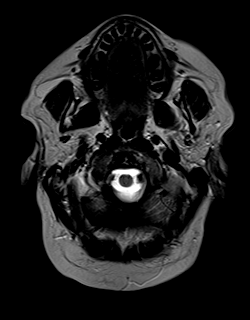
[im 24/24]
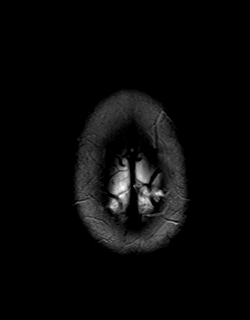

[Series 11: t1_mpr_tra · axial · 1.0mm · 0.72mm/px · z∈[-17,+125]mm · 10 of 144 slices shown (1 of 2)]
[im 1/144]
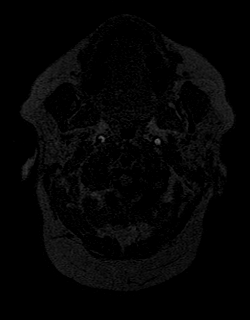
[im 16/144]
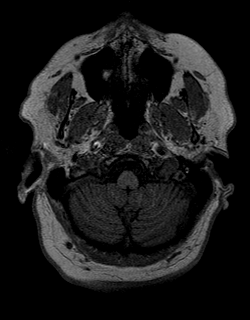
[im 32/144]
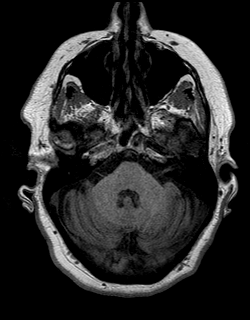
[im 48/144]
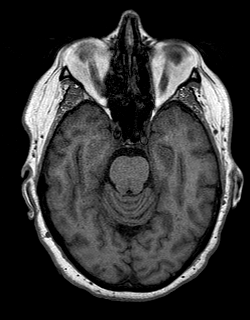
[im 64/144]
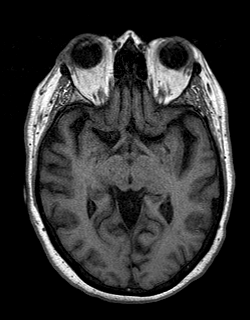
[im 80/144]
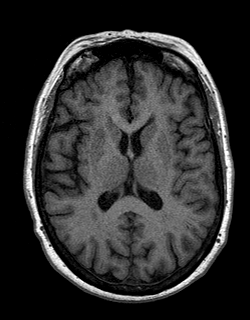
[im 96/144]
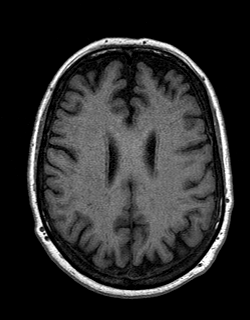
[im 112/144]
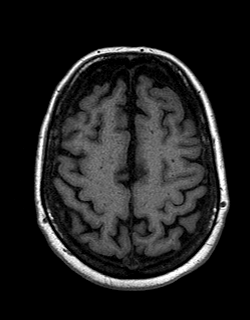
[im 128/144]
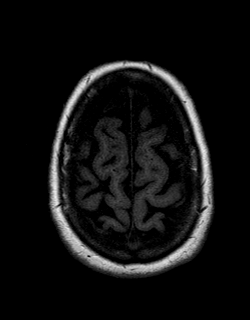
[im 144/144]
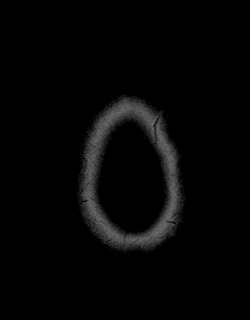

[Series 14: T2 · coronal · 5.0mm · 0.45mm/px · 2 of 28 slices shown]
[im 1/28]
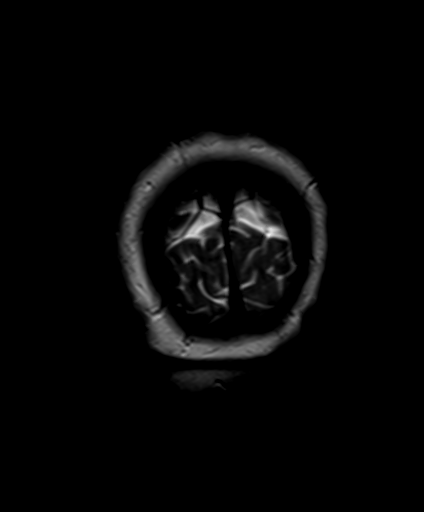
[im 28/28]
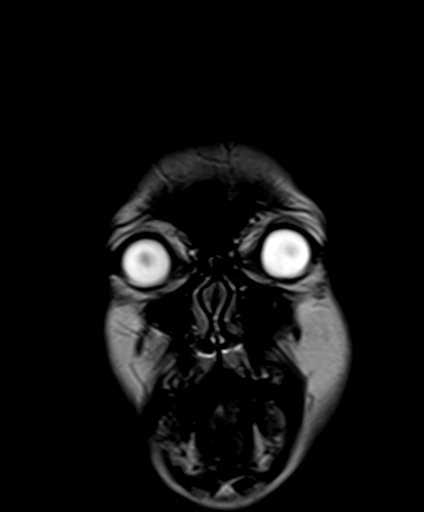

[Series 15: t1_mpr_tra · axial · 1.0mm · 0.72mm/px · z∈[-22,+120]mm · 10 of 144 slices shown (2 of 2)]
[im 1/144]
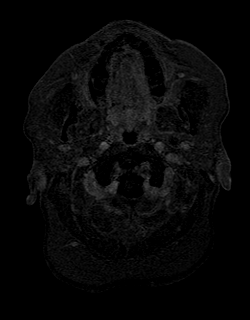
[im 16/144]
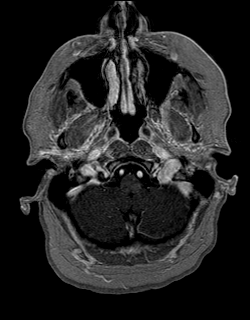
[im 32/144]
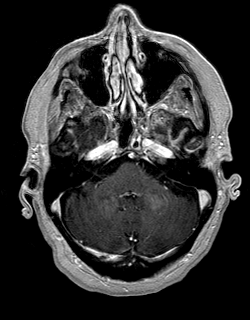
[im 48/144]
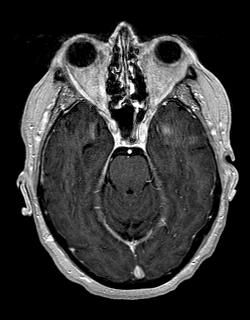
[im 64/144]
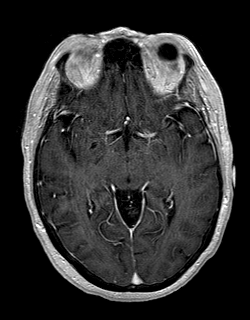
[im 80/144]
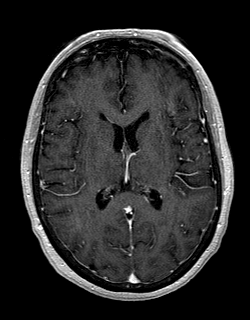
[im 96/144]
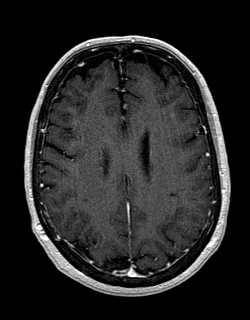
[im 112/144]
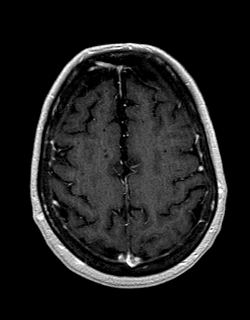
[im 128/144]
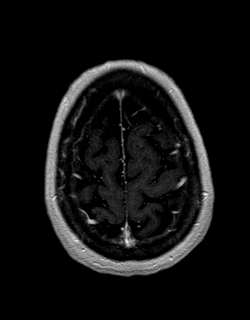
[im 144/144]
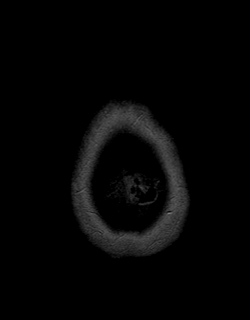

[Series 16: T1 post-contrast · coronal · 5.0mm · 0.72mm/px · 2 of 26 slices shown]
[im 1/26]
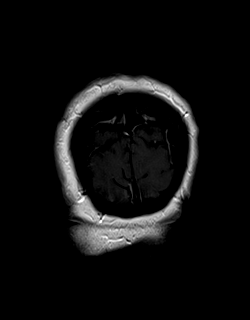
[im 26/26]
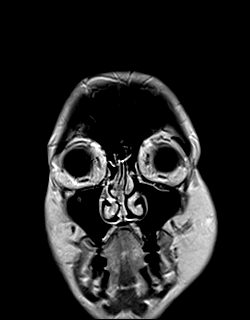

[48 of 48 positions shown; findings below may reference images not displayed]

FINDINGS: Brain:

Mild cerebral and cerebellar atrophy.

Chronic small vessel white matter infarcts within the left frontal
lobe (series 10, image 13) and posterior left temporal lobe (series
10, image 12).

Background mild multifocal T2/FLAIR hyperintensity within the
cerebral white matter is nonspecific, but compatible with chronic
small vessel ischemic disease.

No abnormal intracranial enhancement is demonstrate to suggest
intracranial metastatic disease.

There is no acute infarct.

No evidence of intracranial mass.

No chronic intracranial blood products.

No extra-axial fluid collection.

No midline shift.

Partially empty sella turcica.

Vascular: Expected proximal arterial flow voids.

Skull and upper cervical spine: No focal marrow lesion.
Susceptibility artifact arising from surgical hardware within the
cervical spine.

Sinuses/Orbits: Visualized orbits show no acute finding. Small right
maxillary sinus mucous retention cyst. Trace bilateral ethmoid and
maxillary sinus mucosal thickening.
IMPRESSION: No evidence of intracranial metastatic disease.

No evidence of acute intracranial abnormality.

Chronic small vessel white matter infarcts within the left frontal
lobe and posterior left temporal lobe.

Background mild cerebral white matter chronic small vessel ischemic
disease.

Mild generalized atrophy of the brain.

## 2022-08-08 ENCOUNTER — Ambulatory Visit: Payer: BC Managed Care – PPO

## 2022-08-09 ENCOUNTER — Encounter (HOSPITAL_BASED_OUTPATIENT_CLINIC_OR_DEPARTMENT_OTHER): Payer: BC Managed Care – PPO | Admitting: Obstetrics & Gynecology

## 2022-08-16 DIAGNOSIS — H1089 Other conjunctivitis: Secondary | ICD-10-CM | POA: Diagnosis not present

## 2022-08-16 DIAGNOSIS — J018 Other acute sinusitis: Secondary | ICD-10-CM | POA: Diagnosis not present

## 2022-08-23 DIAGNOSIS — Z79891 Long term (current) use of opiate analgesic: Secondary | ICD-10-CM | POA: Diagnosis not present

## 2022-08-23 DIAGNOSIS — G629 Polyneuropathy, unspecified: Secondary | ICD-10-CM | POA: Diagnosis not present

## 2022-08-29 ENCOUNTER — Ambulatory Visit: Payer: BC Managed Care – PPO | Attending: General Surgery

## 2022-08-29 VITALS — Wt 196.0 lb

## 2022-08-29 DIAGNOSIS — Z483 Aftercare following surgery for neoplasm: Secondary | ICD-10-CM | POA: Insufficient documentation

## 2022-08-29 NOTE — Therapy (Signed)
OUTPATIENT PHYSICAL THERAPY SOZO SCREENING NOTE   Patient Name: [name] MRN: [id number] DOB:November 23, 1950, 71 y.o., female Today's Date: 08/29/2022  PCP: Carol Ada, MD REFERRING PROVIDER: Jovita Kussmaul, MD   PT End of Session - 08/29/22 1709     Visit Number 9   # unchanged due to screen only   PT Start Time 1705    PT Stop Time 1714    PT Time Calculation (min) 9 min    Activity Tolerance Patient tolerated treatment well    Behavior During Therapy WFL for tasks assessed/performed             Past Medical History:  Diagnosis Date   Allergies    Anxiety    Asthma    treated by pulmonologist    Atherosclerosis of aorta ([place])    noted on xray   Breast cancer ([place])    Right   Chronic pain    Depression    Diabetes ([place])    type II   Edema    Goals of care, counseling/discussion 09/02/2020   High cholesterol    History of nuclear stress test 11/12/2009   normal    Hypertension    IgG monoclonal gammopathy of uncertain significance    igG kappa monoclonal gammopathy of unkown significance (per notes from [employer]).   Insomnia    Low back pain    Migraine headache    Neuropathy associated with MGUS ([facility])    Peripheral neuropathy    Pernicious anemia 05/20/2021   PONV (postoperative nausea and vomiting)    hard to wake up   Sleep apnea    Smoldering multiple myeloma 12/04/2017   Vertigo    Past Surgical History:  Procedure Laterality Date   BACK SURGERY  2019   T10-S1 Fusion Dr. Hulda Marin SURGERY  2019   2 fusion in lower back (L4-S1) and neck   breast cyst removal age 67     BREAST LUMPECTOMY WITH RADIOACTIVE SEED AND SENTINEL LYMPH NODE BIOPSY Left 09/30/2020   Procedure: RADIOACTIVE SEED GUIDED LEFT BREAST LUMPECTOMY, LEFT AXILLARY SENTINEL LYMPH NODE BIOPSY;  Surgeon: Jovita Kussmaul, MD;  Location: [facility];  Service: General;  Laterality: Left;   CESAREAN SECTION     x2   hernia repair at 19 months old      miscarriage D&C     NASAL SINUS SURGERY     x2   NECK SURGERY     pain stimulator in back     RE-EXCISION OF BREAST CANCER,SUPERIOR MARGINS Left 10/19/2020   Procedure: RE-EXCISION LEFT BREAST MARGINS;  Surgeon: Jovita Kussmaul, MD;  Location: [facility];  Service: General;  Laterality: Left;   TONSILLECTOMY     Patient Active Problem List   Diagnosis Date Noted   Pernicious anemia 05/20/2021   Goals of care, counseling/discussion 09/02/2020   Malignant neoplasm of upper-outer quadrant of left breast in female, estrogen receptor positive ([place]) 08/27/2020   Iron deficiency anemia 10/09/2019   Impaired mobility and activities of daily living 08/30/2018   Multiple myeloma ([place]) 08/30/2018   S/P spinal fusion 08/30/2018   Lumbar degenerative disc disease 08/27/2018   History of degenerative disc disease 08/15/2018   Neck pain 08/15/2018   Sensorineural hearing loss (SNHL), bilateral 08/15/2018   Tinnitus of both ears 08/15/2018   Smoldering multiple myeloma 12/04/2017   Breakdown (mechanical) of implanted electronic neurostimulator, generator, initial encounter ([place]) 08/12/2016   Myalgia 08/12/2016  Post laminectomy syndrome 08/12/2016   Migraine with aura and without status migrainosus, not intractable 10/15/2015   Intrinsic asthma 01/11/2010   Dyspnea 12/14/2009   DIABETES MELLITUS, TYPE II 12/11/2009   Hyperlipidemia 12/11/2009   MIGRAINE HEADACHE 12/11/2009   Hereditary and idiopathic peripheral neuropathy 12/11/2009   Hypertension 12/11/2009   Insomnia 12/11/2009   EDEMA 12/11/2009   Type 2 diabetes mellitus with diabetic polyneuropathy ([place]) 12/11/2009    REFERRING DIAG: left breast cancer at risk for lymphedema  THERAPY DIAG: Aftercare following surgery for neoplasm  PERTINENT HISTORY: Recent diagnosis of Lt breast cancer IDC stage IA with surgery 09/30/20 with 3 lymph nodes removed and then re-excision 10/19/20.  pt is followed by Dr. Marin Olp due to  smoldering igG Kappa myeloma.  Other history includes: DM, Peripheral neuropathy, and HTN, cervical fusion with limited movement and back T10-S1, vertigo, recent fall with hand fracture   PRECAUTIONS: left UE Lymphedema risk, None  SUBJECTIVE: Pt returns for her 3 month L-Dex screen.   PAIN:  Are you having pain? No  SOZO SCREENING: Patient was assessed today using the SOZO machine to determine the lymphedema index score. This was compared to her baseline score. It was determined that she is within the recommended range when compared to her baseline and no further action is needed at this time. She will continue SOZO screenings. These are done every 3 months for 2 years post operatively followed by every 6 months for 2 years, and then annually.   L-DEX FLOWSHEETS - 08/29/22 1700       L-DEX LYMPHEDEMA SCREENING   Measurement Type Unilateral    L-DEX MEASUREMENT EXTREMITY Upper Extremity    POSITION  Standing    DOMINANT SIDE Right    At Risk Side Left    BASELINE SCORE (UNILATERAL) -2.6    L-DEX SCORE (UNILATERAL) -3.8    VALUE CHANGE (UNILAT) -1.2              Otelia Limes, PTA 08/29/2022, 5:15 PM

## 2022-10-12 DIAGNOSIS — Z853 Personal history of malignant neoplasm of breast: Secondary | ICD-10-CM | POA: Diagnosis not present

## 2022-10-18 ENCOUNTER — Encounter: Payer: Self-pay | Admitting: Medical Oncology

## 2022-10-18 ENCOUNTER — Inpatient Hospital Stay: Payer: BC Managed Care – PPO | Attending: Family

## 2022-10-18 ENCOUNTER — Inpatient Hospital Stay (HOSPITAL_BASED_OUTPATIENT_CLINIC_OR_DEPARTMENT_OTHER): Payer: BC Managed Care – PPO | Admitting: Medical Oncology

## 2022-10-18 VITALS — BP 145/68 | HR 86 | Temp 98.0°F | Resp 17 | Wt 201.0 lb

## 2022-10-18 DIAGNOSIS — C50419 Malignant neoplasm of upper-outer quadrant of unspecified female breast: Secondary | ICD-10-CM

## 2022-10-18 DIAGNOSIS — Z170 Estrogen receptor positive status [ER+]: Secondary | ICD-10-CM

## 2022-10-18 DIAGNOSIS — R232 Flushing: Secondary | ICD-10-CM | POA: Diagnosis not present

## 2022-10-18 DIAGNOSIS — I890 Lymphedema, not elsewhere classified: Secondary | ICD-10-CM | POA: Diagnosis not present

## 2022-10-18 DIAGNOSIS — D510 Vitamin B12 deficiency anemia due to intrinsic factor deficiency: Secondary | ICD-10-CM | POA: Insufficient documentation

## 2022-10-18 DIAGNOSIS — D472 Monoclonal gammopathy: Secondary | ICD-10-CM | POA: Diagnosis not present

## 2022-10-18 DIAGNOSIS — E1140 Type 2 diabetes mellitus with diabetic neuropathy, unspecified: Secondary | ICD-10-CM | POA: Insufficient documentation

## 2022-10-18 DIAGNOSIS — Z13820 Encounter for screening for osteoporosis: Secondary | ICD-10-CM | POA: Diagnosis not present

## 2022-10-18 DIAGNOSIS — Z79811 Long term (current) use of aromatase inhibitors: Secondary | ICD-10-CM | POA: Diagnosis not present

## 2022-10-18 DIAGNOSIS — C50912 Malignant neoplasm of unspecified site of left female breast: Secondary | ICD-10-CM | POA: Insufficient documentation

## 2022-10-18 DIAGNOSIS — Z79899 Other long term (current) drug therapy: Secondary | ICD-10-CM | POA: Insufficient documentation

## 2022-10-18 LAB — CBC WITH DIFFERENTIAL (CANCER CENTER ONLY)
Abs Immature Granulocytes: 0.02 10*3/uL (ref 0.00–0.07)
Basophils Absolute: 0.1 10*3/uL (ref 0.0–0.1)
Basophils Relative: 1 %
Eosinophils Absolute: 0.5 10*3/uL (ref 0.0–0.5)
Eosinophils Relative: 6 %
HCT: 38.9 % (ref 36.0–46.0)
Hemoglobin: 12.4 g/dL (ref 12.0–15.0)
Immature Granulocytes: 0 %
Lymphocytes Relative: 39 %
Lymphs Abs: 3.3 10*3/uL (ref 0.7–4.0)
MCH: 28.1 pg (ref 26.0–34.0)
MCHC: 31.9 g/dL (ref 30.0–36.0)
MCV: 88.2 fL (ref 80.0–100.0)
Monocytes Absolute: 0.8 10*3/uL (ref 0.1–1.0)
Monocytes Relative: 9 %
Neutro Abs: 3.8 10*3/uL (ref 1.7–7.7)
Neutrophils Relative %: 45 %
Platelet Count: 299 10*3/uL (ref 150–400)
RBC: 4.41 MIL/uL (ref 3.87–5.11)
RDW: 13.2 % (ref 11.5–15.5)
WBC Count: 8.5 10*3/uL (ref 4.0–10.5)
nRBC: 0 % (ref 0.0–0.2)

## 2022-10-18 LAB — CMP (CANCER CENTER ONLY)
ALT: 13 U/L (ref 0–44)
AST: 17 U/L (ref 15–41)
Albumin: 4.3 g/dL (ref 3.5–5.0)
Alkaline Phosphatase: 51 U/L (ref 38–126)
Anion gap: 8 (ref 5–15)
BUN: 16 mg/dL (ref 8–23)
CO2: 30 mmol/L (ref 22–32)
Calcium: 10 mg/dL (ref 8.9–10.3)
Chloride: 100 mmol/L (ref 98–111)
Creatinine: 0.7 mg/dL (ref 0.44–1.00)
GFR, Estimated: >60 mL/min (ref >60)
Glucose, Bld: 170 mg/dL — ABNORMAL HIGH (ref 70–99)
Potassium: 3.6 mmol/L (ref 3.5–5.1)
Sodium: 138 mmol/L (ref 135–145)
Total Bilirubin: 0.6 mg/dL (ref 0.3–1.2)
Total Protein: 7.8 g/dL (ref 6.5–8.1)

## 2022-10-18 LAB — LACTATE DEHYDROGENASE: LDH: 143 U/L (ref 98–192)

## 2022-10-18 NOTE — Progress Notes (Signed)
Hematology and Oncology Follow Up Visit  LILE MCCURLEY [id number] 01-May-1951 72 y.o. 10/18/2022   Principle Diagnosis:  1. Stage IA (T1cN0M0) infiltrating ductal carcinoma of the LEFT breast -- ER+/PR+/HER2- -- Oncotype = 16 2. IgG Kappa smoldering myeloma 3. Chronic neuropathy 4. Pernicious Anemia  Past Therapy: Velcade q week dosing (3/1) -- s/p cycle #2 -- d/c on 02/06/2018 for lack of effectiveness S/p XRT to the LEFT breast -- completed on 01/01/2021  Current Therapy:       Femara 2.5 mg po q day -- start on 01/19/2021 Vit B12 1000 mcg IM q month - start on 03/25/2020   Interim History:  Ms. Vandrunen is here for follow-up. She is doing well. She is seen every 4 months for breast cancer surveillance and for her history of smoldering myeloma.   Has mild lymphedema of the left side. No breast changes that she has noted. No night sweats or unintentional weight loss.   She is doing well on Femara. She does have some hot flashes and hair thinning attributed to this medication. She asks what she can do about the hair thinning.   Back in October, M-spike was 0.9 g/dL, IgG level 1,544 mg/dL and kappa light chains 4.'54mg'$ /dL. No new bone pains.    No fever, chills, n/v, cough, rash, chest pain, palpitations, abdominal pain or changes in bowel or bladder habits.  Neuropathy in her lower extremities unchanged from baseline.  No falls or syncope reported. She will work on hydrating better. She has been supplementing her decreased appetite with Glucerna as needed. Weight is 199 lbs.   ECOG Performance Status: 0 - Asymptomatic Wt Readings from Last 3 Encounters:  10/18/22 201 lb (91.2 kg)  08/29/22 196 lb (88.9 kg)  06/15/22 199 lb (90.3 kg)    Medications:  Allergies as of 10/18/2022       Reactions   Dristan [pheniramine-phenylephrine] Palpitations   Other Palpitations   Contact "old cold medicine        Medication List        Accurate as of October 18, 2022  2:21  PM. If you have any questions, ask your nurse or doctor.          albuterol 108 (90 Base) MCG/ACT inhaler Commonly known as: VENTOLIN HFA Inhale into the lungs every 6 (six) hours as needed for wheezing or shortness of breath.   cyclobenzaprine 5 MG tablet Commonly known as: FLEXERIL Take 5 mg by mouth 3 (three) times daily as needed.   HYDROcodone-acetaminophen 10-325 MG tablet Commonly known as: NORCO   letrozole 2.5 MG tablet Commonly known as: FEMARA TAKE 1 TABLET BY MOUTH EVERY DAY   metFORMIN 500 MG 24 hr tablet Commonly known as: GLUCOPHAGE-XR Take 1,000 mg by mouth 2 (two) times daily.   metoprolol succinate 100 MG 24 hr tablet Commonly known as: TOPROL-XL Take 100 mg by mouth daily.   morphine 60 MG 12 hr tablet Commonly known as: MS CONTIN Take 60 mg by mouth every 12 (twelve) hours.   pravastatin 40 MG tablet Commonly known as: PRAVACHOL Take 40 mg by mouth daily.   venlafaxine XR 150 MG 24 hr capsule Commonly known as: EFFEXOR-XR Take 150 mg by mouth daily with breakfast.   Vitamin D (Ergocalciferol) 1.25 MG (50000 UNIT) Caps capsule Commonly known as: DRISDOL TAKE 1 CAPSULE BY MOUTH ONE TIME PER WEEK        Allergies:  Allergies  Allergen Reactions   Dristan [Pheniramine-Phenylephrine] Palpitations   Other  Palpitations    Contact "old cold medicine    Past Medical History, Surgical history, Social history, and Family History were reviewed and updated.  Review of Systems: All other 10 point review of systems is negative.   Physical Exam:  weight is 201 lb (91.2 kg). Her oral temperature is 98 F (36.7 C). Her blood pressure is 145/68 (abnormal) and her pulse is 86. Her respiration is 17 and oxygen saturation is 95%.   Wt Readings from Last 3 Encounters:  10/18/22 201 lb (91.2 kg)  08/29/22 196 lb (88.9 kg)  06/15/22 199 lb (90.3 kg)    Ocular: Sclerae unicteric, pupils equal, round and reactive to light Ear-nose-throat: Oropharynx  clear, dentition fair Lymphatic: No cervical or supraclavicular adenopathy Lungs no rales or rhonchi, good excursion bilaterally Heart regular rate and rhythm, no murmur appreciated Abd soft, nontender, positive bowel sounds MSK no focal spinal tenderness, no joint edema Neuro: non-focal, well-oriented, appropriate affect Breasts: Deferred   Lab Results  Component Value Date   WBC 8.5 10/18/2022   HGB 12.4 10/18/2022   HCT 38.9 10/18/2022   MCV 88.2 10/18/2022   PLT 299 10/18/2022   Lab Results  Component Value Date   FERRITIN 55 10/03/2019   IRON 68 10/03/2019   TIBC 389 10/03/2019   UIBC 321 10/03/2019   IRONPCTSAT 17 (L) 10/03/2019   Lab Results  Component Value Date   RBC 4.41 10/18/2022   Lab Results  Component Value Date   KPAFRELGTCHN 45.4 (H) 06/15/2022   LAMBDASER 32.9 (H) 06/15/2022   KAPLAMBRATIO 1.38 06/15/2022   Lab Results  Component Value Date   IGGSERUM 1,544 06/15/2022   IGA 61 (L) 06/15/2022   IGMSERUM 52 06/15/2022   Lab Results  Component Value Date   TOTALPROTELP 7.5 06/15/2022   ALBUMINELP 3.7 06/15/2022   A1GS 0.2 06/15/2022   A2GS 0.8 06/15/2022   BETS 1.2 06/15/2022   BETA2SER 0.3 02/20/2015   GAMS 1.5 06/15/2022   MSPIKE 0.9 (H) 06/15/2022   SPEI * 02/20/2015     Chemistry      Component Value Date/Time   NA 138 10/18/2022 1133   NA 139 03/23/2020 1220   NA 140 03/27/2017 0804   K 3.6 10/18/2022 1133   K 3.7 03/27/2017 0804   CL 100 10/18/2022 1133   CL 94 (L) 02/20/2015 1010   CO2 30 10/18/2022 1133   CO2 28 03/27/2017 0804   BUN 16 10/18/2022 1133   BUN 18 03/23/2020 1220   BUN 14.9 03/27/2017 0804   CREATININE 0.70 10/18/2022 1133   CREATININE 0.9 03/27/2017 0804      Component Value Date/Time   CALCIUM 10.0 10/18/2022 1133   CALCIUM 10.0 03/27/2017 0804   ALKPHOS 51 10/18/2022 1133   ALKPHOS 69 03/27/2017 0804   AST 17 10/18/2022 1133   AST 26 03/27/2017 0804   ALT 13 10/18/2022 1133   ALT 28 03/27/2017 0804    BILITOT 0.6 10/18/2022 1133   BILITOT 0.76 03/27/2017 0804       Impression and Plan: Ms. Simmon is 72 yo postmenopausal white female with history of stage Ia ductal carcinoma of the left breast with low Oncotype score.  She had lumpectomy. She completed 2 cycle of Velcade which was not effective.  She then completed radiation to the left breast.  She is now on Femara which she is tolerating fairly well. Ok to use Rogaine for her thinning hair She also has history or smoldering myeloma and also chronic  neuropathy due to diabetes and chronic back issues.  Protein studies are pending but have been stable Last mammogram was on Jan 31st at [facility]- normal per patient She is due for a DEXA scan- last was many years ago. I have placed an order for this today.  Follow-up in 4 months.   Hughie Closs, PA-C 2/6/20242:21 PM

## 2022-10-19 LAB — KAPPA/LAMBDA LIGHT CHAINS
Kappa free light chain: 43.4 mg/L — ABNORMAL HIGH (ref 3.3–19.4)
Kappa, lambda light chain ratio: 1.48 (ref 0.26–1.65)
Lambda free light chains: 29.3 mg/L — ABNORMAL HIGH (ref 5.7–26.3)

## 2022-10-19 LAB — BETA 2 MICROGLOBULIN, SERUM: Beta-2 Microglobulin: 1.9 mg/L (ref 0.6–2.4)

## 2022-10-20 ENCOUNTER — Telehealth: Payer: Self-pay | Admitting: *Deleted

## 2022-10-20 LAB — IGG, IGA, IGM
IgA: 56 mg/dL — ABNORMAL LOW (ref 64–422)
IgG (Immunoglobin G), Serum: 1473 mg/dL (ref 586–1602)
IgM (Immunoglobulin M), Srm: 46 mg/dL (ref 26–217)

## 2022-10-20 NOTE — Telephone Encounter (Signed)
Per scheduling message Judson Roch called and lvm that she could schedule a dexascan within 4 months and gave her the phone number to imaging.

## 2022-10-21 LAB — PROTEIN ELECTROPHORESIS, SERUM
A/G Ratio: 1.1 (ref 0.7–1.7)
Albumin ELP: 3.7 g/dL (ref 2.9–4.4)
Alpha-1-Globulin: 0.2 g/dL (ref 0.0–0.4)
Alpha-2-Globulin: 0.8 g/dL (ref 0.4–1.0)
Beta Globulin: 1 g/dL (ref 0.7–1.3)
Gamma Globulin: 1.4 g/dL (ref 0.4–1.8)
Globulin, Total: 3.4 g/dL (ref 2.2–3.9)
M-Spike, %: 0.9 g/dL — ABNORMAL HIGH
Total Protein ELP: 7.1 g/dL (ref 6.0–8.5)

## 2022-10-24 DIAGNOSIS — E1142 Type 2 diabetes mellitus with diabetic polyneuropathy: Secondary | ICD-10-CM | POA: Diagnosis not present

## 2022-10-24 DIAGNOSIS — G894 Chronic pain syndrome: Secondary | ICD-10-CM | POA: Diagnosis not present

## 2022-10-24 DIAGNOSIS — Z79891 Long term (current) use of opiate analgesic: Secondary | ICD-10-CM | POA: Diagnosis not present

## 2022-10-24 DIAGNOSIS — M961 Postlaminectomy syndrome, not elsewhere classified: Secondary | ICD-10-CM | POA: Diagnosis not present

## 2022-12-09 ENCOUNTER — Other Ambulatory Visit: Payer: Self-pay | Admitting: Family

## 2022-12-19 ENCOUNTER — Ambulatory Visit: Payer: BC Managed Care – PPO

## 2022-12-30 ENCOUNTER — Other Ambulatory Visit (HOSPITAL_COMMUNITY): Payer: Self-pay

## 2022-12-30 ENCOUNTER — Encounter: Payer: Self-pay | Admitting: Family

## 2022-12-30 DIAGNOSIS — M961 Postlaminectomy syndrome, not elsewhere classified: Secondary | ICD-10-CM | POA: Diagnosis not present

## 2022-12-30 DIAGNOSIS — Z79891 Long term (current) use of opiate analgesic: Secondary | ICD-10-CM | POA: Diagnosis not present

## 2022-12-30 DIAGNOSIS — E1142 Type 2 diabetes mellitus with diabetic polyneuropathy: Secondary | ICD-10-CM | POA: Diagnosis not present

## 2022-12-30 DIAGNOSIS — G894 Chronic pain syndrome: Secondary | ICD-10-CM | POA: Diagnosis not present

## 2022-12-30 MED ORDER — MORPHINE SULFATE ER 60 MG PO TBCR
60.0000 mg | EXTENDED_RELEASE_TABLET | Freq: Two times a day (BID) | ORAL | 0 refills | Status: DC
  Filled 2022-12-30: qty 60, 30d supply, fill #0

## 2023-01-02 DIAGNOSIS — E1140 Type 2 diabetes mellitus with diabetic neuropathy, unspecified: Secondary | ICD-10-CM | POA: Diagnosis not present

## 2023-01-02 DIAGNOSIS — L74519 Primary focal hyperhidrosis, unspecified: Secondary | ICD-10-CM | POA: Diagnosis not present

## 2023-01-02 DIAGNOSIS — E782 Mixed hyperlipidemia: Secondary | ICD-10-CM | POA: Diagnosis not present

## 2023-01-02 DIAGNOSIS — I10 Essential (primary) hypertension: Secondary | ICD-10-CM | POA: Diagnosis not present

## 2023-01-03 ENCOUNTER — Ambulatory Visit (HOSPITAL_BASED_OUTPATIENT_CLINIC_OR_DEPARTMENT_OTHER)
Admission: RE | Admit: 2023-01-03 | Discharge: 2023-01-03 | Disposition: A | Discharge status: Home / Self Care | Payer: BC Managed Care – PPO | Source: Ambulatory Visit | Attending: Medical Oncology | Admitting: Medical Oncology

## 2023-01-03 DIAGNOSIS — Z170 Estrogen receptor positive status [ER+]: Secondary | ICD-10-CM | POA: Diagnosis not present

## 2023-01-03 DIAGNOSIS — D472 Monoclonal gammopathy: Secondary | ICD-10-CM

## 2023-01-03 DIAGNOSIS — C50419 Malignant neoplasm of upper-outer quadrant of unspecified female breast: Secondary | ICD-10-CM | POA: Diagnosis not present

## 2023-01-03 DIAGNOSIS — Z13820 Encounter for screening for osteoporosis: Secondary | ICD-10-CM | POA: Diagnosis not present

## 2023-01-03 DIAGNOSIS — Z79811 Long term (current) use of aromatase inhibitors: Secondary | ICD-10-CM | POA: Diagnosis not present

## 2023-01-03 DIAGNOSIS — Z780 Asymptomatic menopausal state: Secondary | ICD-10-CM | POA: Diagnosis not present

## 2023-01-16 ENCOUNTER — Ambulatory Visit: Payer: BC Managed Care – PPO | Attending: General Surgery

## 2023-01-16 VITALS — Wt 203.5 lb

## 2023-01-16 DIAGNOSIS — Z483 Aftercare following surgery for neoplasm: Secondary | ICD-10-CM

## 2023-01-16 NOTE — Therapy (Signed)
OUTPATIENT PHYSICAL THERAPY SOZO SCREENING NOTE   Patient Name: [name] MRN: [id number] DOB:[birth year], 72 y.o., female Today's Date: 01/16/2023  PCP: Merri Brunette, MD REFERRING PROVIDER: Griselda Miner, MD   PT End of Session - 01/16/23 1650     Visit Number 9   # unchanged due to screen only   PT Start Time 1647    PT Stop Time 1652    PT Time Calculation (min) 5 min    Activity Tolerance Patient tolerated treatment well    Behavior During Therapy WFL for tasks assessed/performed             Past Medical History:  Diagnosis Date   Allergies    Anxiety    Asthma    treated by pulmonologist    Atherosclerosis of aorta (HCC)    noted on xray   Breast cancer (HCC)    Right   Chronic pain    Depression    Diabetes (HCC)    type II   Edema    Goals of care, counseling/discussion 09/02/2020   High cholesterol    History of nuclear stress test 11/12/2009   normal    Hypertension    IgG monoclonal gammopathy of uncertain significance    igG kappa monoclonal gammopathy of unkown significance (per notes from [facility]).   Insomnia    Low back pain    Migraine headache    Neuropathy associated with MGUS (HCC)    Peripheral neuropathy    Pernicious anemia 05/20/2021   PONV (postoperative nausea and vomiting)    hard to wake up   Sleep apnea    Smoldering multiple myeloma 12/04/2017   Vertigo    Past Surgical History:  Procedure Laterality Date   BACK SURGERY  2019   T10-S1 Fusion Dr. Rupert Stacks SURGERY  2019   2 fusion in lower back (L4-S1) and neck   breast cyst removal age 10     BREAST LUMPECTOMY WITH RADIOACTIVE SEED AND SENTINEL LYMPH NODE BIOPSY Left 09/30/2020   Procedure: RADIOACTIVE SEED GUIDED LEFT BREAST LUMPECTOMY, LEFT AXILLARY SENTINEL LYMPH NODE BIOPSY;  Surgeon: Griselda Miner, MD;  Location: [facility];  Service: General;  Laterality: Left;   CESAREAN SECTION     x2   hernia repair at 28 months old      miscarriage D&C     NASAL SINUS SURGERY     x2   NECK SURGERY     pain stimulator in back     RE-EXCISION OF BREAST CANCER,SUPERIOR MARGINS Left 10/19/2020   Procedure: RE-EXCISION LEFT BREAST MARGINS;  Surgeon: Griselda Miner, MD;  Location: [facility];  Service: General;  Laterality: Left;   TONSILLECTOMY     Patient Active Problem List   Diagnosis Date Noted   Pernicious anemia 05/20/2021   Goals of care, counseling/discussion 09/02/2020   Malignant neoplasm of upper-outer quadrant of left breast in female, estrogen receptor positive (HCC) 08/27/2020   Iron deficiency anemia 10/09/2019   Impaired mobility and activities of daily living 08/30/2018   Multiple myeloma (HCC) 08/30/2018   S/P spinal fusion 08/30/2018   Lumbar degenerative disc disease 08/27/2018   History of degenerative disc disease 08/15/2018   Neck pain 08/15/2018   Sensorineural hearing loss (SNHL), bilateral 08/15/2018   Tinnitus of both ears 08/15/2018   Smoldering multiple myeloma 12/04/2017   Breakdown (mechanical) of implanted electronic neurostimulator, generator, initial encounter (HCC) 08/12/2016   Myalgia 08/12/2016  Post laminectomy syndrome 08/12/2016   Migraine with aura and without status migrainosus, not intractable 10/15/2015   Intrinsic asthma 01/11/2010   Dyspnea 12/14/2009   DIABETES MELLITUS, TYPE II 12/11/2009   Hyperlipidemia 12/11/2009   MIGRAINE HEADACHE 12/11/2009   Hereditary and idiopathic peripheral neuropathy 12/11/2009   Hypertension 12/11/2009   Insomnia 12/11/2009   EDEMA 12/11/2009   Type 2 diabetes mellitus with diabetic polyneuropathy (HCC) 12/11/2009    REFERRING DIAG: left breast cancer at risk for lymphedema  THERAPY DIAG: Aftercare following surgery for neoplasm  PERTINENT HISTORY: Recent diagnosis of Lt breast cancer IDC stage IA with surgery 09/30/20 with 3 lymph nodes removed and then re-excision 10/19/20.  pt is followed by Dr. Myna Hidalgo due to  smoldering igG Kappa myeloma.  Other history includes: DM, Peripheral neuropathy, and HTN, cervical fusion with limited movement and back T10-S1, vertigo, recent fall with hand fracture   PRECAUTIONS: left UE Lymphedema risk, None  SUBJECTIVE: Pt returns for her 3 month L-Dex screen.   PAIN:  Are you having pain? No  SOZO SCREENING: Patient was assessed today using the SOZO machine to determine the lymphedema index score. This was compared to her baseline score. It was determined that she is within the recommended range when compared to her baseline and no further action is needed at this time. She will continue SOZO screenings. These are done every 3 months for 2 years post operatively followed by every 6 months for 2 years, and then annually.   L-DEX FLOWSHEETS - 01/16/23 1600       L-DEX LYMPHEDEMA SCREENING   Measurement Type Unilateral    L-DEX MEASUREMENT EXTREMITY Upper Extremity    POSITION  Standing    DOMINANT SIDE Right    At Risk Side Left    BASELINE SCORE (UNILATERAL) -2.6    L-DEX SCORE (UNILATERAL) -0.4    VALUE CHANGE (UNILAT) 2.2              Hermenia Bers, PTA 01/16/2023, 4:52 PM

## 2023-01-23 ENCOUNTER — Ambulatory Visit: Payer: Medicare Other | Admitting: Podiatry

## 2023-01-27 ENCOUNTER — Other Ambulatory Visit (HOSPITAL_COMMUNITY): Payer: Self-pay

## 2023-01-28 ENCOUNTER — Other Ambulatory Visit (HOSPITAL_COMMUNITY): Payer: Self-pay

## 2023-02-01 DIAGNOSIS — H903 Sensorineural hearing loss, bilateral: Secondary | ICD-10-CM | POA: Diagnosis not present

## 2023-02-02 ENCOUNTER — Other Ambulatory Visit (HOSPITAL_COMMUNITY): Payer: Self-pay

## 2023-02-02 MED ORDER — CYCLOBENZAPRINE HCL 5 MG PO TABS
5.0000 mg | ORAL_TABLET | Freq: Three times a day (TID) | ORAL | 1 refills | Status: DC | PRN
  Filled 2023-02-02: qty 90, 30d supply, fill #0

## 2023-02-02 MED ORDER — MORPHINE SULFATE ER 60 MG PO TBCR
60.0000 mg | EXTENDED_RELEASE_TABLET | Freq: Two times a day (BID) | ORAL | 0 refills | Status: AC
  Filled 2023-02-02: qty 60, 30d supply, fill #0

## 2023-02-07 ENCOUNTER — Other Ambulatory Visit (HOSPITAL_COMMUNITY): Payer: Self-pay

## 2023-02-08 DIAGNOSIS — H903 Sensorineural hearing loss, bilateral: Secondary | ICD-10-CM | POA: Diagnosis not present

## 2023-02-09 ENCOUNTER — Other Ambulatory Visit (HOSPITAL_COMMUNITY): Payer: Self-pay

## 2023-02-09 ENCOUNTER — Encounter: Payer: Self-pay | Admitting: Family

## 2023-02-09 DIAGNOSIS — E1142 Type 2 diabetes mellitus with diabetic polyneuropathy: Secondary | ICD-10-CM | POA: Diagnosis not present

## 2023-02-09 DIAGNOSIS — B351 Tinea unguium: Secondary | ICD-10-CM | POA: Diagnosis not present

## 2023-02-10 ENCOUNTER — Other Ambulatory Visit: Payer: Self-pay | Admitting: Family

## 2023-02-10 DIAGNOSIS — D472 Monoclonal gammopathy: Secondary | ICD-10-CM

## 2023-02-10 DIAGNOSIS — D510 Vitamin B12 deficiency anemia due to intrinsic factor deficiency: Secondary | ICD-10-CM

## 2023-02-13 ENCOUNTER — Inpatient Hospital Stay: Payer: Medicare Other

## 2023-02-13 ENCOUNTER — Ambulatory Visit: Payer: Medicare Other | Admitting: Podiatry

## 2023-02-13 ENCOUNTER — Ambulatory Visit: Payer: BC Managed Care – PPO | Admitting: Family

## 2023-02-17 ENCOUNTER — Encounter: Payer: Self-pay | Admitting: Family

## 2023-02-20 ENCOUNTER — Encounter (HOSPITAL_BASED_OUTPATIENT_CLINIC_OR_DEPARTMENT_OTHER): Payer: BC Managed Care – PPO | Admitting: Obstetrics & Gynecology

## 2023-02-27 ENCOUNTER — Other Ambulatory Visit (HOSPITAL_COMMUNITY): Payer: Self-pay

## 2023-02-27 ENCOUNTER — Encounter: Payer: Self-pay | Admitting: Family

## 2023-02-27 MED ORDER — HYDROCODONE-ACETAMINOPHEN 10-325 MG PO TABS
ORAL_TABLET | ORAL | 0 refills | Status: DC
  Filled 2023-02-27: qty 60, 15d supply, fill #0

## 2023-02-27 MED ORDER — CYCLOBENZAPRINE HCL 5 MG PO TABS: 5.0000 mg | ORAL_TABLET | Freq: Three times a day (TID) | ORAL | 1 refills | Status: DC | PRN

## 2023-02-28 ENCOUNTER — Other Ambulatory Visit (HOSPITAL_COMMUNITY): Payer: Self-pay

## 2023-03-06 ENCOUNTER — Inpatient Hospital Stay (HOSPITAL_BASED_OUTPATIENT_CLINIC_OR_DEPARTMENT_OTHER): Payer: Medicare Other | Admitting: Family

## 2023-03-06 ENCOUNTER — Encounter: Payer: Self-pay | Admitting: Family

## 2023-03-06 ENCOUNTER — Other Ambulatory Visit: Payer: Self-pay

## 2023-03-06 ENCOUNTER — Inpatient Hospital Stay: Payer: Medicare Other | Attending: Hematology & Oncology

## 2023-03-06 VITALS — BP 138/82 | HR 86 | Resp 18

## 2023-03-06 DIAGNOSIS — R2689 Other abnormalities of gait and mobility: Secondary | ICD-10-CM | POA: Insufficient documentation

## 2023-03-06 DIAGNOSIS — C50912 Malignant neoplasm of unspecified site of left female breast: Secondary | ICD-10-CM | POA: Insufficient documentation

## 2023-03-06 DIAGNOSIS — D472 Monoclonal gammopathy: Secondary | ICD-10-CM

## 2023-03-06 DIAGNOSIS — Z79899 Other long term (current) drug therapy: Secondary | ICD-10-CM | POA: Insufficient documentation

## 2023-03-06 DIAGNOSIS — G629 Polyneuropathy, unspecified: Secondary | ICD-10-CM | POA: Insufficient documentation

## 2023-03-06 DIAGNOSIS — Z170 Estrogen receptor positive status [ER+]: Secondary | ICD-10-CM | POA: Diagnosis not present

## 2023-03-06 DIAGNOSIS — R42 Dizziness and giddiness: Secondary | ICD-10-CM | POA: Insufficient documentation

## 2023-03-06 DIAGNOSIS — Z79811 Long term (current) use of aromatase inhibitors: Secondary | ICD-10-CM | POA: Insufficient documentation

## 2023-03-06 DIAGNOSIS — R0602 Shortness of breath: Secondary | ICD-10-CM | POA: Insufficient documentation

## 2023-03-06 DIAGNOSIS — D510 Vitamin B12 deficiency anemia due to intrinsic factor deficiency: Secondary | ICD-10-CM | POA: Diagnosis not present

## 2023-03-06 LAB — CBC WITH DIFFERENTIAL (CANCER CENTER ONLY)
Abs Immature Granulocytes: 0.11 10*3/uL — ABNORMAL HIGH (ref 0.00–0.07)
Basophils Absolute: 0.1 10*3/uL (ref 0.0–0.1)
Basophils Relative: 1 %
Eosinophils Absolute: 0.3 10*3/uL (ref 0.0–0.5)
Eosinophils Relative: 3 %
HCT: 37.8 % (ref 36.0–46.0)
Hemoglobin: 12 g/dL (ref 12.0–15.0)
Immature Granulocytes: 1 %
Lymphocytes Relative: 35 %
Lymphs Abs: 3.3 10*3/uL (ref 0.7–4.0)
MCH: 27.9 pg (ref 26.0–34.0)
MCHC: 31.7 g/dL (ref 30.0–36.0)
MCV: 87.9 fL (ref 80.0–100.0)
Monocytes Absolute: 0.9 10*3/uL (ref 0.1–1.0)
Monocytes Relative: 9 %
Neutro Abs: 4.7 10*3/uL (ref 1.7–7.7)
Neutrophils Relative %: 51 %
Platelet Count: 328 10*3/uL (ref 150–400)
RBC: 4.3 MIL/uL (ref 3.87–5.11)
RDW: 12.8 % (ref 11.5–15.5)
WBC Count: 9.4 10*3/uL (ref 4.0–10.5)
nRBC: 0 % (ref 0.0–0.2)

## 2023-03-06 LAB — CMP (CANCER CENTER ONLY)
ALT: 11 U/L (ref 0–44)
AST: 15 U/L (ref 15–41)
Albumin: 4.4 g/dL (ref 3.5–5.0)
Alkaline Phosphatase: 47 U/L (ref 38–126)
Anion gap: 8 (ref 5–15)
BUN: 22 mg/dL (ref 8–23)
CO2: 30 mmol/L (ref 22–32)
Calcium: 10.1 mg/dL (ref 8.9–10.3)
Chloride: 100 mmol/L (ref 98–111)
Creatinine: 0.86 mg/dL (ref 0.44–1.00)
GFR, Estimated: >60 mL/min (ref >60)
Glucose, Bld: 179 mg/dL — ABNORMAL HIGH (ref 70–99)
Potassium: 3.5 mmol/L (ref 3.5–5.1)
Sodium: 138 mmol/L (ref 135–145)
Total Bilirubin: 0.5 mg/dL (ref 0.3–1.2)
Total Protein: 7.2 g/dL (ref 6.5–8.1)

## 2023-03-06 LAB — VITAMIN B12: Vitamin B-12: 88 pg/mL — ABNORMAL LOW (ref 180–914)

## 2023-03-06 LAB — LACTATE DEHYDROGENASE: LDH: 153 U/L (ref 98–192)

## 2023-03-06 NOTE — Progress Notes (Signed)
Hematology and Oncology Follow Up Visit  [name] [id number] 1950-10-03 72 y.o. 03/06/2023   Principle Diagnosis:  1. Stage IA (T1cN0M0) infiltrating ductal carcinoma of the LEFT breast -- ER+/PR+/HER2- -- Oncotype = 16 2. IgG Kappa smoldering myeloma 3. Chronic neuropathy 4. Pernicious Anemia   Past Therapy: Velcade q week dosing (3/1) -- s/p cycle #2 -- d/c on 02/06/2018 for lack of effectiveness S/p XRT to the LEFT breast -- completed on 01/01/2021   Current Therapy:       Femara 2.5 mg po q day -- start on 01/19/2021 Vit B12 1000 mcg IM q month - start on 03/25/2020   Interim History:  [name] is here today for follow-up. She is doing well but has had a lot of pain in her knees. She is considering seeing an orthopedist but really does not want to have knee replacement surgery.  No change on breast exam. No adenopathy noted or lymphedema at this time.  She states that she will occasionally have some fluid in the upper left arm from previous lymph node removal but this is not often.  She states that her mammogram in January was negative.  Bone density scan in April was normal.  M-spike in February was stable at 0.9 g/dL, IgG level 4,098 mg/dL and kappa light chains were 4.34 mg/dL.  No issue with infection. No fever, chills, n/v, cough, rash, chest pain, palpitations, abdominal pain or changes in bowel or bladder habits.  She has occasional dizziness and SOB.  She had a fall recently where she landed on her bed and thankfully was not injured. She states that the neuropathy in her feet causes poor balance. She ambulates most of the time with a cane for added support.  No syncope.  Appetite and hydration are good. Weight is stable at 105 lbs.   ECOG Performance Status: 1 - Symptomatic but completely ambulatory  Medications:  Allergies as of 03/06/2023       Reactions   Dristan [pheniramine-phenylephrine] Palpitations   Other Palpitations   Contact "old cold  medicine        Medication List        Accurate as of March 06, 2023  1:55 PM. If you have any questions, ask your nurse or doctor.          albuterol 108 (90 Base) MCG/ACT inhaler Commonly known as: VENTOLIN HFA Inhale into the lungs every 6 (six) hours as needed for wheezing or shortness of breath.   cyclobenzaprine 5 MG tablet Commonly known as: FLEXERIL Take 5 mg by mouth 3 (three) times daily as needed. What changed: Another medication with the same name was removed. Continue taking this medication, and follow the directions you see here. Changed by: Eileen Stanford, NP   cyclobenzaprine 5 MG tablet Commonly known as: FLEXERIL Take 1 tablet (5 mg total) by mouth 3 (three) times daily as needed. What changed: Another medication with the same name was removed. Continue taking this medication, and follow the directions you see here. Changed by: Eileen Stanford, NP   HYDROcodone-acetaminophen 10-325 MG tablet Commonly known as: NORCO   HYDROcodone-acetaminophen 10-325 MG tablet Commonly known as: NORCO Take 1 tablet by mouth four times a day as needed for pain.   letrozole 2.5 MG tablet Commonly known as: FEMARA TAKE 1 TABLET BY MOUTH EVERY DAY   metFORMIN 500 MG 24 hr tablet Commonly known as: GLUCOPHAGE-XR Take 1,000 mg by mouth 2 (two) times daily.   metoprolol succinate 100 MG  24 hr tablet Commonly known as: TOPROL-XL Take 100 mg by mouth daily.   morphine 60 MG 12 hr tablet Commonly known as: MS CONTIN Take 60 mg by mouth every 12 (twelve) hours.   morphine 60 MG 12 hr tablet Commonly known as: MS CONTIN Take 1 tablet (60 mg total) by mouth every 12 (twelve) hours.   pravastatin 40 MG tablet Commonly known as: PRAVACHOL Take 40 mg by mouth daily.   venlafaxine XR 150 MG 24 hr capsule Commonly known as: EFFEXOR-XR Take 150 mg by mouth daily with breakfast.   Vitamin D (Ergocalciferol) 1.25 MG (50000 UNIT) Caps capsule Commonly known as: DRISDOL TAKE 1  CAPSULE BY MOUTH ONE TIME PER WEEK        Allergies:  Allergies  Allergen Reactions   Dristan [Pheniramine-Phenylephrine] Palpitations   Other Palpitations    Contact "old cold medicine    Past Medical History, Surgical history, Social history, and Family History were reviewed and updated.  Review of Systems: All other 10 point review of systems is negative.   Physical Exam:  vitals were not taken for this visit.   Wt Readings from Last 3 Encounters:  01/16/23 203 lb 8 oz (92.3 kg)  10/18/22 201 lb (91.2 kg)  08/29/22 196 lb (88.9 kg)    Ocular: Sclerae unicteric, pupils equal, round and reactive to light Ear-nose-throat: Oropharynx clear, dentition fair Lymphatic: No cervical, supraclavicular or axillary adenopathy Lungs no rales or rhonchi, good excursion bilaterally Heart regular rate and rhythm, no murmur appreciated Abd soft, nontender, positive bowel sounds MSK no focal spinal tenderness, no joint edema Neuro: non-focal, well-oriented, appropriate affect Breasts: Same as above  Lab Results  Component Value Date   WBC 9.4 03/06/2023   HGB 12.0 03/06/2023   HCT 37.8 03/06/2023   MCV 87.9 03/06/2023   PLT 328 03/06/2023   Lab Results  Component Value Date   FERRITIN 55 10/03/2019   IRON 68 10/03/2019   TIBC 389 10/03/2019   UIBC 321 10/03/2019   IRONPCTSAT 17 (L) 10/03/2019   Lab Results  Component Value Date   RBC 4.30 03/06/2023   Lab Results  Component Value Date   KPAFRELGTCHN 43.4 (H) 10/18/2022   LAMBDASER 29.3 (H) 10/18/2022   KAPLAMBRATIO 1.48 10/18/2022   Lab Results  Component Value Date   IGGSERUM 1,473 10/18/2022   IGA 56 (L) 10/18/2022   IGMSERUM 46 10/18/2022   Lab Results  Component Value Date   TOTALPROTELP 7.1 10/18/2022   ALBUMINELP 3.7 10/18/2022   A1GS 0.2 10/18/2022   A2GS 0.8 10/18/2022   BETS 1.0 10/18/2022   BETA2SER 0.3 02/20/2015   GAMS 1.4 10/18/2022   MSPIKE 0.9 (H) 10/18/2022   SPEI Comment 10/18/2022      Chemistry      Component Value Date/Time   NA 138 10/18/2022 1133   NA 139 03/23/2020 1220   NA 140 03/27/2017 0804   K 3.6 10/18/2022 1133   K 3.7 03/27/2017 0804   CL 100 10/18/2022 1133   CL 94 (L) 02/20/2015 1010   CO2 30 10/18/2022 1133   CO2 28 03/27/2017 0804   BUN 16 10/18/2022 1133   BUN 18 03/23/2020 1220   BUN 14.9 03/27/2017 0804   CREATININE 0.70 10/18/2022 1133   CREATININE 0.9 03/27/2017 0804      Component Value Date/Time   CALCIUM 10.0 10/18/2022 1133   CALCIUM 10.0 03/27/2017 0804   ALKPHOS 51 10/18/2022 1133   ALKPHOS 69 03/27/2017 0804  AST 17 10/18/2022 1133   AST 26 03/27/2017 0804   ALT 13 10/18/2022 1133   ALT 28 03/27/2017 0804   BILITOT 0.6 10/18/2022 1133   BILITOT 0.76 03/27/2017 0804       Impression and Plan: [name] is 72 yo postmenopausal white female with history of stage Ia ductal carcinoma of the left breast with low Oncotype score.  She had lumpectomy. She completed 2 cycle of Velcade which was not effective.  She then completed radiation to the left breast.  She is now on Femara and tolerating nicely. She will continue her same regimen.  She also has history or smoldering myeloma.  Protein studies are pending.  Follow-up in 4 months.   Eileen Stanford, NP 6/24/20241:55 PM

## 2023-03-07 ENCOUNTER — Other Ambulatory Visit: Payer: Self-pay | Admitting: Family

## 2023-03-07 DIAGNOSIS — D510 Vitamin B12 deficiency anemia due to intrinsic factor deficiency: Secondary | ICD-10-CM

## 2023-03-07 LAB — IGG, IGA, IGM
IgA: 63 mg/dL — ABNORMAL LOW (ref 64–422)
IgG (Immunoglobin G), Serum: 1475 mg/dL (ref 586–1602)
IgM (Immunoglobulin M), Srm: 52 mg/dL (ref 26–217)

## 2023-03-07 LAB — KAPPA/LAMBDA LIGHT CHAINS
Kappa free light chain: 29.9 mg/L — ABNORMAL HIGH (ref 3.3–19.4)
Kappa, lambda light chain ratio: 1.02 (ref 0.26–1.65)
Lambda free light chains: 29.2 mg/L — ABNORMAL HIGH (ref 5.7–26.3)

## 2023-03-07 LAB — BETA 2 MICROGLOBULIN, SERUM: Beta-2 Microglobulin: 1.8 mg/L (ref 0.6–2.4)

## 2023-03-08 ENCOUNTER — Encounter: Payer: Self-pay | Admitting: Family

## 2023-03-08 LAB — PROTEIN ELECTROPHORESIS, SERUM
A/G Ratio: 1.1 (ref 0.7–1.7)
Albumin ELP: 3.6 g/dL (ref 2.9–4.4)
Alpha-1-Globulin: 0.2 g/dL (ref 0.0–0.4)
Alpha-2-Globulin: 0.8 g/dL (ref 0.4–1.0)
Beta Globulin: 1.1 g/dL (ref 0.7–1.3)
Gamma Globulin: 1.3 g/dL (ref 0.4–1.8)
Globulin, Total: 3.4 g/dL (ref 2.2–3.9)
M-Spike, %: 0.7 g/dL — ABNORMAL HIGH
Total Protein ELP: 7 g/dL (ref 6.0–8.5)

## 2023-03-10 ENCOUNTER — Other Ambulatory Visit: Payer: Self-pay | Admitting: Family

## 2023-03-10 DIAGNOSIS — D510 Vitamin B12 deficiency anemia due to intrinsic factor deficiency: Secondary | ICD-10-CM

## 2023-03-10 MED ORDER — CYANOCOBALAMIN 1000 MCG/ML IJ SOLN
1000.0000 ug | INTRAMUSCULAR | 1 refills | Status: DC
Start: 2023-03-10 — End: 2023-05-15

## 2023-03-13 ENCOUNTER — Encounter: Payer: Self-pay | Admitting: *Deleted

## 2023-03-14 ENCOUNTER — Other Ambulatory Visit: Payer: Self-pay | Admitting: Hematology & Oncology

## 2023-03-15 ENCOUNTER — Encounter: Payer: Self-pay | Admitting: Family

## 2023-03-15 NOTE — Telephone Encounter (Signed)
Pharmacy information updated; per pt request.

## 2023-05-15 ENCOUNTER — Other Ambulatory Visit: Payer: Self-pay | Admitting: Family

## 2023-05-15 DIAGNOSIS — D510 Vitamin B12 deficiency anemia due to intrinsic factor deficiency: Secondary | ICD-10-CM

## 2023-06-19 ENCOUNTER — Other Ambulatory Visit (HOSPITAL_COMMUNITY): Payer: Self-pay

## 2023-06-19 MED ORDER — HYDROCODONE-ACETAMINOPHEN 10-325 MG PO TABS
1.0000 | ORAL_TABLET | Freq: Four times a day (QID) | ORAL | 0 refills | Status: DC | PRN
  Filled 2023-06-19: qty 60, 15d supply, fill #0

## 2023-06-19 MED ORDER — CYCLOBENZAPRINE HCL 5 MG PO TABS
5.0000 mg | ORAL_TABLET | Freq: Three times a day (TID) | ORAL | 1 refills | Status: DC
  Filled 2023-06-19: qty 90, 30d supply, fill #0

## 2023-06-27 ENCOUNTER — Other Ambulatory Visit (HOSPITAL_COMMUNITY): Payer: Self-pay

## 2023-06-29 ENCOUNTER — Inpatient Hospital Stay: Payer: Medicare Other

## 2023-06-29 ENCOUNTER — Ambulatory Visit: Payer: Medicare Other | Admitting: Hematology & Oncology

## 2023-07-10 ENCOUNTER — Inpatient Hospital Stay: Payer: Medicare Other | Admitting: Hematology & Oncology

## 2023-07-10 ENCOUNTER — Inpatient Hospital Stay: Payer: Medicare Other

## 2023-07-20 ENCOUNTER — Encounter: Payer: Self-pay | Admitting: Medical Oncology

## 2023-07-20 ENCOUNTER — Inpatient Hospital Stay: Payer: Medicare Other | Attending: Hematology & Oncology

## 2023-07-20 ENCOUNTER — Inpatient Hospital Stay: Payer: Medicare Other | Admitting: Medical Oncology

## 2023-07-20 VITALS — BP 127/70 | HR 89 | Temp 98.1°F | Resp 20 | Ht 66.0 in | Wt 205.0 lb

## 2023-07-20 DIAGNOSIS — R42 Dizziness and giddiness: Secondary | ICD-10-CM | POA: Diagnosis not present

## 2023-07-20 DIAGNOSIS — Z79899 Other long term (current) drug therapy: Secondary | ICD-10-CM | POA: Insufficient documentation

## 2023-07-20 DIAGNOSIS — D510 Vitamin B12 deficiency anemia due to intrinsic factor deficiency: Secondary | ICD-10-CM | POA: Insufficient documentation

## 2023-07-20 DIAGNOSIS — Z923 Personal history of irradiation: Secondary | ICD-10-CM | POA: Insufficient documentation

## 2023-07-20 DIAGNOSIS — C50412 Malignant neoplasm of upper-outer quadrant of left female breast: Secondary | ICD-10-CM | POA: Diagnosis present

## 2023-07-20 DIAGNOSIS — Z1721 Progesterone receptor positive status: Secondary | ICD-10-CM | POA: Insufficient documentation

## 2023-07-20 DIAGNOSIS — R0602 Shortness of breath: Secondary | ICD-10-CM | POA: Insufficient documentation

## 2023-07-20 DIAGNOSIS — G629 Polyneuropathy, unspecified: Secondary | ICD-10-CM | POA: Diagnosis not present

## 2023-07-20 DIAGNOSIS — C50419 Malignant neoplasm of upper-outer quadrant of unspecified female breast: Secondary | ICD-10-CM | POA: Diagnosis not present

## 2023-07-20 DIAGNOSIS — D472 Monoclonal gammopathy: Secondary | ICD-10-CM

## 2023-07-20 DIAGNOSIS — Z1732 Human epidermal growth factor receptor 2 negative status: Secondary | ICD-10-CM | POA: Insufficient documentation

## 2023-07-20 DIAGNOSIS — Z79811 Long term (current) use of aromatase inhibitors: Secondary | ICD-10-CM | POA: Diagnosis not present

## 2023-07-20 DIAGNOSIS — Z170 Estrogen receptor positive status [ER+]: Secondary | ICD-10-CM | POA: Diagnosis not present

## 2023-07-20 LAB — LACTATE DEHYDROGENASE: LDH: 154 U/L (ref 98–192)

## 2023-07-20 LAB — CMP (CANCER CENTER ONLY)
ALT: 14 U/L (ref 0–44)
AST: 18 U/L (ref 15–41)
Albumin: 4.7 g/dL (ref 3.5–5.0)
Alkaline Phosphatase: 51 U/L (ref 38–126)
Anion gap: 9 (ref 5–15)
BUN: 16 mg/dL (ref 8–23)
CO2: 31 mmol/L (ref 22–32)
Calcium: 10.3 mg/dL (ref 8.9–10.3)
Chloride: 101 mmol/L (ref 98–111)
Creatinine: 0.76 mg/dL (ref 0.44–1.00)
GFR, Estimated: >60 mL/min (ref >60)
Glucose, Bld: 180 mg/dL — ABNORMAL HIGH (ref 70–99)
Potassium: 3.7 mmol/L (ref 3.5–5.1)
Sodium: 141 mmol/L (ref 135–145)
Total Bilirubin: 0.5 mg/dL (ref <1.2)
Total Protein: 8.2 g/dL — ABNORMAL HIGH (ref 6.5–8.1)

## 2023-07-20 LAB — CBC WITH DIFFERENTIAL (CANCER CENTER ONLY)
Abs Immature Granulocytes: 0.02 10*3/uL (ref 0.00–0.07)
Basophils Absolute: 0.1 10*3/uL (ref 0.0–0.1)
Basophils Relative: 1 %
Eosinophils Absolute: 0.5 10*3/uL (ref 0.0–0.5)
Eosinophils Relative: 5 %
HCT: 40.8 % (ref 36.0–46.0)
Hemoglobin: 13 g/dL (ref 12.0–15.0)
Immature Granulocytes: 0 %
Lymphocytes Relative: 40 %
Lymphs Abs: 3.8 10*3/uL (ref 0.7–4.0)
MCH: 27.6 pg (ref 26.0–34.0)
MCHC: 31.9 g/dL (ref 30.0–36.0)
MCV: 86.6 fL (ref 80.0–100.0)
Monocytes Absolute: 0.8 10*3/uL (ref 0.1–1.0)
Monocytes Relative: 8 %
Neutro Abs: 4.5 10*3/uL (ref 1.7–7.7)
Neutrophils Relative %: 46 %
Platelet Count: 383 10*3/uL (ref 150–400)
RBC: 4.71 MIL/uL (ref 3.87–5.11)
RDW: 13.1 % (ref 11.5–15.5)
WBC Count: 9.7 10*3/uL (ref 4.0–10.5)
nRBC: 0 % (ref 0.0–0.2)

## 2023-07-20 LAB — VITAMIN B12: Vitamin B-12: 364 pg/mL (ref 180–914)

## 2023-07-20 NOTE — Progress Notes (Addendum)
Hematology and Oncology Follow Up Visit  [name] [id number] 12/27/1950 72 y.o. 07/20/2023   Principle Diagnosis:  1. Stage IA (T1cN0M0) infiltrating ductal carcinoma of the LEFT breast -- ER+/PR+/HER2- -- Oncotype = 16 2. IgG Kappa smoldering myeloma 3. Chronic neuropathy 4. Pernicious Anemia   Past Therapy: Velcade q week dosing (3/1) -- s/p cycle #2 -- d/c on 02/06/2018 for lack of effectiveness S/p XRT to the LEFT breast -- completed on 01/01/2021   Current Therapy:       Femara 2.5 mg po q day -- start on 01/19/2021 Vit B12 1000 mcg IM q month - start on 03/25/2020   Interim History:  [name] is here today for follow-up.   She reports that she is doing ok. She has been seen by PT for some dizziness that she is having. No visual changes, double vision, no unusual headaches.   She denies any breast changes or concerns.  She states that she will occasionally have some fluid in the upper left arm from previous lymph node removal but this is not often.  She is due for her next mammogram in Jan - [facility]  Bone density scan in April was normal.  M-spike in June was stable at 0.7 g/dL, IgG level 0,865 mg/dL and kappa light chains were 2.99 mg/dL.  No issue with infection. No fever, chills, n/v, cough, rash, chest pain, palpitations, abdominal pain or changes in bowel or bladder habits.  She has occasional dizziness and SOB.  No syncope.  Appetite and hydration are good.  Wt Readings from Last 3 Encounters:  07/20/23 205 lb (93 kg)  01/16/23 203 lb 8 oz (92.3 kg)  10/18/22 201 lb (91.2 kg)      ECOG Performance Status: 1 - Symptomatic but completely ambulatory  Medications:  Allergies as of 07/20/2023       Reactions   Dristan [pheniramine-phenylephrine] Palpitations   Other Palpitations   Contact "old cold medicine        Medication List        Accurate as of July 20, 2023  1:00 PM. If you have any questions, ask your nurse or doctor.           albuterol 108 (90 Base) MCG/ACT inhaler Commonly known as: VENTOLIN HFA Inhale into the lungs every 6 (six) hours as needed for wheezing or shortness of breath.   cyanocobalamin 1000 MCG/ML injection Commonly known as: VITAMIN B12 INJECT 1 ML (1,000 MCG TOTAL) INTO THE MUSCLE ONCE A WEEK FOR 4 DOSES. THEN 1 ML IM MONTHLY.   cyclobenzaprine 5 MG tablet Commonly known as: FLEXERIL Take 1 tablet (5 mg total) by mouth 3 (three) times daily as needed   Dexcom G7 Receiver Devi as directed external check sugars frequently for 90 days   HYDROcodone-acetaminophen 10-325 MG tablet Commonly known as: NORCO Take 1 tablet by mouth 4 (four) times daily as needed for pain.   letrozole 2.5 MG tablet Commonly known as: FEMARA TAKE 1 TABLET BY MOUTH EVERY DAY   meclizine 25 MG tablet Commonly known as: ANTIVERT Take 25 mg by mouth 2 (two) times daily as needed.   metFORMIN 500 MG 24 hr tablet Commonly known as: GLUCOPHAGE-XR Take 1,000 mg by mouth 2 (two) times daily.   metoprolol succinate 100 MG 24 hr tablet Commonly known as: TOPROL-XL Take 100 mg by mouth daily.   morphine 60 MG 12 hr tablet Commonly known as: MS CONTIN Take 1 tablet (60 mg total) by mouth  every 12 (twelve) hours.   pravastatin 40 MG tablet Commonly known as: PRAVACHOL Take 40 mg by mouth daily.   venlafaxine XR 150 MG 24 hr capsule Commonly known as: EFFEXOR-XR Take 150 mg by mouth daily with breakfast.   Vitamin D (Ergocalciferol) 1.25 MG (50000 UNIT) Caps capsule Commonly known as: DRISDOL TAKE 1 CAPSULE BY MOUTH ONE TIME PER WEEK        Allergies:  Allergies  Allergen Reactions   Dristan [Pheniramine-Phenylephrine] Palpitations   Other Palpitations    Contact "old cold medicine    Past Medical History, Surgical history, Social history, and Family History were reviewed and updated.  Review of Systems: All other 10 point review of systems is negative.   Physical Exam:  height is 5\' 6"   (1.676 m) and weight is 205 lb (93 kg). Her oral temperature is 98.1 F (36.7 C). Her blood pressure is 127/70 and her pulse is 89. Her respiration is 20 and oxygen saturation is 94%.   Wt Readings from Last 3 Encounters:  07/20/23 205 lb (93 kg)  01/16/23 203 lb 8 oz (92.3 kg)  10/18/22 201 lb (91.2 kg)    Ocular: Sclerae unicteric, pupils equal, round and reactive to light Ear-nose-throat: Oropharynx clear, dentition fair Lymphatic: No cervical, supraclavicular or axillary adenopathy Lungs no rales or rhonchi, good excursion bilaterally Heart regular rate and rhythm, no murmur appreciated Abd soft, nontender, positive bowel sounds MSK no focal spinal tenderness, no joint edema Neuro: non-focal, well-oriented, appropriate affect Breasts: Declined.   Lab Results  Component Value Date   WBC 9.7 07/20/2023   HGB 13.0 07/20/2023   HCT 40.8 07/20/2023   MCV 86.6 07/20/2023   PLT 383 07/20/2023   Lab Results  Component Value Date   FERRITIN 55 10/03/2019   IRON 68 10/03/2019   TIBC 389 10/03/2019   UIBC 321 10/03/2019   IRONPCTSAT 17 (L) 10/03/2019   Lab Results  Component Value Date   RBC 4.71 07/20/2023   Lab Results  Component Value Date   KPAFRELGTCHN 29.9 (H) 03/06/2023   LAMBDASER 29.2 (H) 03/06/2023   KAPLAMBRATIO 1.02 03/06/2023   Lab Results  Component Value Date   IGGSERUM 1,475 03/06/2023   IGA 63 (L) 03/06/2023   IGMSERUM 52 03/06/2023   Lab Results  Component Value Date   TOTALPROTELP 7.0 03/06/2023   ALBUMINELP 3.6 03/06/2023   A1GS 0.2 03/06/2023   A2GS 0.8 03/06/2023   BETS 1.1 03/06/2023   BETA2SER 0.3 02/20/2015   GAMS 1.3 03/06/2023   MSPIKE 0.7 (H) 03/06/2023   SPEI Comment 03/06/2023     Chemistry      Component Value Date/Time   NA 141 07/20/2023 1114   NA 139 03/23/2020 1220   NA 140 03/27/2017 0804   K 3.7 07/20/2023 1114   K 3.7 03/27/2017 0804   CL 101 07/20/2023 1114   CL 94 (L) 02/20/2015 1010   CO2 31 07/20/2023 1114    CO2 28 03/27/2017 0804   BUN 16 07/20/2023 1114   BUN 18 03/23/2020 1220   BUN 14.9 03/27/2017 0804   CREATININE 0.76 07/20/2023 1114   CREATININE 0.9 03/27/2017 0804      Component Value Date/Time   CALCIUM 10.3 07/20/2023 1114   CALCIUM 10.0 03/27/2017 0804   ALKPHOS 51 07/20/2023 1114   ALKPHOS 69 03/27/2017 0804   AST 18 07/20/2023 1114   AST 26 03/27/2017 0804   ALT 14 07/20/2023 1114   ALT 28 03/27/2017 0804  BILITOT 0.5 07/20/2023 1114   BILITOT 0.76 03/27/2017 0804      Encounter Diagnoses  Name Primary?   Smoldering multiple myeloma Yes   Pernicious anemia    Malignant neoplasm of upper-outer quadrant of breast in female, estrogen receptor positive, unspecified laterality (HCC)    Prophylactic use of letrozole (Femara)     Impression and Plan: Ms. Brundage is 72 yo postmenopausal white female with history of stage Ia ductal carcinoma of the left breast with low Oncotype score. She had lumpectomy. She completed 2 cycle of Velcade which was not effective. She then completed radiation to the left breast.  She is now on Femara and tolerating nicely. She will continue her same regimen.   She also has history of smoldering myeloma- improved recently  Protein studies are pending.  RTC 4 months however if monoclonal studies continue to improve as they have we can stretch out to 6 months follow up   Rushie Chestnut, PA-C 11/7/20241:00 PM

## 2023-07-21 LAB — KAPPA/LAMBDA LIGHT CHAINS
Kappa free light chain: 37.6 mg/L — ABNORMAL HIGH (ref 3.3–19.4)
Kappa, lambda light chain ratio: 1.22 (ref 0.26–1.65)
Lambda free light chains: 30.9 mg/L — ABNORMAL HIGH (ref 5.7–26.3)

## 2023-07-22 LAB — IGG, IGA, IGM
IgA: 63 mg/dL — ABNORMAL LOW (ref 64–422)
IgG (Immunoglobin G), Serum: 1572 mg/dL (ref 586–1602)
IgM (Immunoglobulin M), Srm: 56 mg/dL (ref 26–217)

## 2023-07-24 ENCOUNTER — Ambulatory Visit: Payer: Medicare Other | Attending: General Surgery

## 2023-07-24 VITALS — Wt 204.4 lb

## 2023-07-24 DIAGNOSIS — Z483 Aftercare following surgery for neoplasm: Secondary | ICD-10-CM | POA: Insufficient documentation

## 2023-07-24 LAB — PROTEIN ELECTROPHORESIS, SERUM
A/G Ratio: 1.1 (ref 0.7–1.7)
Albumin ELP: 3.9 g/dL (ref 2.9–4.4)
Alpha-1-Globulin: 0.2 g/dL (ref 0.0–0.4)
Alpha-2-Globulin: 0.9 g/dL (ref 0.4–1.0)
Beta Globulin: 1.3 g/dL (ref 0.7–1.3)
Gamma Globulin: 1.4 g/dL (ref 0.4–1.8)
Globulin, Total: 3.7 g/dL (ref 2.2–3.9)
M-Spike, %: 0.8 g/dL — ABNORMAL HIGH
Total Protein ELP: 7.6 g/dL (ref 6.0–8.5)

## 2023-07-24 NOTE — Therapy (Addendum)
OUTPATIENT PHYSICAL THERAPY SOZO SCREENING NOTE   Patient Name: [name] MRN: [id number] DOB:October 09, 1950, 72 y.o., female Today's Date: 07/24/2023  PCP: Josph Macho, MD REFERRING PROVIDER: Griselda Miner, MD   PT End of Session - 07/24/23 1652     Visit Number 9   # unchanged due to screen only   PT Start Time 1650    PT Stop Time 1655    PT Time Calculation (min) 5 min    Activity Tolerance Patient tolerated treatment well    Behavior During Therapy WFL for tasks assessed/performed             Past Medical History:  Diagnosis Date   Allergies    Anxiety    Asthma    treated by pulmonologist    Atherosclerosis of aorta (HCC)    noted on xray   Breast cancer (HCC)    Right   Chronic pain    Depression    Diabetes (HCC)    type II   Edema    Goals of care, counseling/discussion 09/02/2020   High cholesterol    History of nuclear stress test 11/12/2009   normal    Hypertension    IgG monoclonal gammopathy of uncertain significance    igG kappa monoclonal gammopathy of unkown significance (per notes from [facility]).   Insomnia    Low back pain    Migraine headache    Neuropathy associated with MGUS (HCC)    Peripheral neuropathy    Pernicious anemia 05/20/2021   PONV (postoperative nausea and vomiting)    hard to wake up   Sleep apnea    Smoldering multiple myeloma 12/04/2017   Vertigo    Past Surgical History:  Procedure Laterality Date   BACK SURGERY  2019   T10-S1 Fusion Dr. Rupert Stacks SURGERY  2019   2 fusion in lower back (L4-S1) and neck   breast cyst removal age 19     BREAST LUMPECTOMY WITH RADIOACTIVE SEED AND SENTINEL LYMPH NODE BIOPSY Left 09/30/2020   Procedure: RADIOACTIVE SEED GUIDED LEFT BREAST LUMPECTOMY, LEFT AXILLARY SENTINEL LYMPH NODE BIOPSY;  Surgeon: Griselda Miner, MD;  Location: [facility];  Service: General;  Laterality: Left;   CESAREAN SECTION     x2   hernia repair at 16 months old      miscarriage D&C     NASAL SINUS SURGERY     x2   NECK SURGERY     pain stimulator in back     RE-EXCISION OF BREAST CANCER,SUPERIOR MARGINS Left 10/19/2020   Procedure: RE-EXCISION LEFT BREAST MARGINS;  Surgeon: Griselda Miner, MD;  Location: [facility];  Service: General;  Laterality: Left;   TONSILLECTOMY     Patient Active Problem List   Diagnosis Date Noted   Pernicious anemia 05/20/2021   Goals of care, counseling/discussion 09/02/2020   Malignant neoplasm of upper-outer quadrant of left breast in female, estrogen receptor positive (HCC) 08/27/2020   Iron deficiency anemia 10/09/2019   Impaired mobility and activities of daily living 08/30/2018   Multiple myeloma (HCC) 08/30/2018   S/P spinal fusion 08/30/2018   Lumbar degenerative disc disease 08/27/2018   History of degenerative disc disease 08/15/2018   Neck pain 08/15/2018   Sensorineural hearing loss (SNHL), bilateral 08/15/2018   Tinnitus of both ears 08/15/2018   Smoldering multiple myeloma 12/04/2017   Breakdown (mechanical) of implanted electronic neurostimulator, generator, initial encounter (HCC) 08/12/2016   Myalgia 08/12/2016  Post laminectomy syndrome 08/12/2016   Migraine with aura and without status migrainosus, not intractable 10/15/2015   Intrinsic asthma 01/11/2010   Dyspnea 12/14/2009   DIABETES MELLITUS, TYPE II 12/11/2009   Hyperlipidemia 12/11/2009   MIGRAINE HEADACHE 12/11/2009   Hereditary and idiopathic peripheral neuropathy 12/11/2009   Hypertension 12/11/2009   Insomnia 12/11/2009   EDEMA 12/11/2009   Type 2 diabetes mellitus with diabetic polyneuropathy (HCC) 12/11/2009    REFERRING DIAG: left breast cancer at risk for lymphedema  THERAPY DIAG: Aftercare following surgery for neoplasm  PERTINENT HISTORY: Recent diagnosis of Lt breast cancer IDC stage IA with surgery 09/30/20 with 3 lymph nodes removed and then re-excision 10/19/20.  pt is followed by Dr. Myna Hidalgo due to  smoldering igG Kappa myeloma.  Other history includes: DM, Peripheral neuropathy, and HTN, cervical fusion with limited movement and back T10-S1, vertigo, recent fall with hand fracture   PRECAUTIONS: left UE Lymphedema risk, None  SUBJECTIVE: Pt returns for her 6 month L-Dex screen.   PAIN:  Are you having pain? No  SOZO SCREENING: Patient was assessed today using the SOZO machine to determine the lymphedema index score. This was compared to her baseline score. It was determined that she is within the recommended range when compared to her baseline and no further action is needed at this time. She will continue SOZO screenings. These are done every 3 months for 2 years post operatively followed by every 6 months for 2 years, and then annually. Assisted pts with doffing and donning socks and shoes.    L-DEX FLOWSHEETS - 07/24/23 1600       L-DEX LYMPHEDEMA SCREENING   Measurement Type Unilateral    L-DEX MEASUREMENT EXTREMITY Upper Extremity    POSITION  Standing    DOMINANT SIDE Right    At Risk Side Left    BASELINE SCORE (UNILATERAL) -2.6    L-DEX SCORE (UNILATERAL) 2.4    VALUE CHANGE (UNILAT) 5              Hermenia Bers, PTA 07/24/2023, 4:54 PM

## 2023-10-09 ENCOUNTER — Other Ambulatory Visit: Payer: Self-pay | Admitting: Hematology & Oncology

## 2023-10-19 LAB — HM MAMMOGRAPHY

## 2023-11-15 ENCOUNTER — Inpatient Hospital Stay: Payer: Medicare Other | Admitting: Family

## 2023-11-15 ENCOUNTER — Inpatient Hospital Stay: Payer: Medicare Other

## 2023-11-21 ENCOUNTER — Inpatient Hospital Stay (HOSPITAL_BASED_OUTPATIENT_CLINIC_OR_DEPARTMENT_OTHER): Admitting: Family

## 2023-11-21 ENCOUNTER — Inpatient Hospital Stay: Attending: Hematology & Oncology

## 2023-11-21 ENCOUNTER — Encounter: Payer: Self-pay | Admitting: Family

## 2023-11-21 VITALS — BP 145/69 | HR 83 | Temp 98.1°F | Resp 18 | Wt 202.0 lb

## 2023-11-21 DIAGNOSIS — D472 Monoclonal gammopathy: Secondary | ICD-10-CM

## 2023-11-21 DIAGNOSIS — G629 Polyneuropathy, unspecified: Secondary | ICD-10-CM | POA: Insufficient documentation

## 2023-11-21 DIAGNOSIS — C50419 Malignant neoplasm of upper-outer quadrant of unspecified female breast: Secondary | ICD-10-CM

## 2023-11-21 DIAGNOSIS — D510 Vitamin B12 deficiency anemia due to intrinsic factor deficiency: Secondary | ICD-10-CM | POA: Insufficient documentation

## 2023-11-21 DIAGNOSIS — Z1721 Progesterone receptor positive status: Secondary | ICD-10-CM | POA: Diagnosis not present

## 2023-11-21 DIAGNOSIS — C50412 Malignant neoplasm of upper-outer quadrant of left female breast: Secondary | ICD-10-CM | POA: Diagnosis present

## 2023-11-21 DIAGNOSIS — R5383 Other fatigue: Secondary | ICD-10-CM | POA: Diagnosis not present

## 2023-11-21 DIAGNOSIS — Z170 Estrogen receptor positive status [ER+]: Secondary | ICD-10-CM | POA: Insufficient documentation

## 2023-11-21 DIAGNOSIS — Z1732 Human epidermal growth factor receptor 2 negative status: Secondary | ICD-10-CM | POA: Diagnosis not present

## 2023-11-21 DIAGNOSIS — Z79899 Other long term (current) drug therapy: Secondary | ICD-10-CM | POA: Diagnosis not present

## 2023-11-21 DIAGNOSIS — Z923 Personal history of irradiation: Secondary | ICD-10-CM | POA: Diagnosis not present

## 2023-11-21 DIAGNOSIS — Z79811 Long term (current) use of aromatase inhibitors: Secondary | ICD-10-CM | POA: Insufficient documentation

## 2023-11-21 LAB — CBC WITH DIFFERENTIAL (CANCER CENTER ONLY)
Abs Immature Granulocytes: 0.01 10*3/uL (ref 0.00–0.07)
Basophils Absolute: 0.1 10*3/uL (ref 0.0–0.1)
Basophils Relative: 1 %
Eosinophils Absolute: 0.4 10*3/uL (ref 0.0–0.5)
Eosinophils Relative: 4 %
HCT: 40.3 % (ref 36.0–46.0)
Hemoglobin: 12.9 g/dL (ref 12.0–15.0)
Immature Granulocytes: 0 %
Lymphocytes Relative: 36 %
Lymphs Abs: 3.2 10*3/uL (ref 0.7–4.0)
MCH: 27.7 pg (ref 26.0–34.0)
MCHC: 32 g/dL (ref 30.0–36.0)
MCV: 86.7 fL (ref 80.0–100.0)
Monocytes Absolute: 0.7 10*3/uL (ref 0.1–1.0)
Monocytes Relative: 8 %
Neutro Abs: 4.5 10*3/uL (ref 1.7–7.7)
Neutrophils Relative %: 51 %
Platelet Count: 346 10*3/uL (ref 150–400)
RBC: 4.65 MIL/uL (ref 3.87–5.11)
RDW: 13.1 % (ref 11.5–15.5)
WBC Count: 8.8 10*3/uL (ref 4.0–10.5)
nRBC: 0 % (ref 0.0–0.2)

## 2023-11-21 LAB — CMP (CANCER CENTER ONLY)
ALT: 14 U/L (ref 0–44)
AST: 18 U/L (ref 15–41)
Albumin: 4.7 g/dL (ref 3.5–5.0)
Alkaline Phosphatase: 51 U/L (ref 38–126)
Anion gap: 9 (ref 5–15)
BUN: 18 mg/dL (ref 8–23)
CO2: 31 mmol/L (ref 22–32)
Calcium: 10.1 mg/dL (ref 8.9–10.3)
Chloride: 98 mmol/L (ref 98–111)
Creatinine: 0.86 mg/dL (ref 0.44–1.00)
GFR, Estimated: >60 mL/min (ref >60)
Glucose, Bld: 126 mg/dL — ABNORMAL HIGH (ref 70–99)
Potassium: 3.6 mmol/L (ref 3.5–5.1)
Sodium: 138 mmol/L (ref 135–145)
Total Bilirubin: 0.6 mg/dL (ref 0.0–1.2)
Total Protein: 8.1 g/dL (ref 6.5–8.1)

## 2023-11-21 LAB — LACTATE DEHYDROGENASE: LDH: 151 U/L (ref 98–192)

## 2023-11-21 LAB — VITAMIN B12: Vitamin B-12: 468 pg/mL (ref 180–914)

## 2023-11-21 NOTE — Progress Notes (Signed)
 Hematology and Oncology Follow Up Visit  [name] [id number] 08-27-1951 73 y.o. 11/21/2023   Principle Diagnosis:  Stage IA (T1cN0M0) infiltrating ductal carcinoma of the LEFT breast -- ER+/PR+/HER2- -- Oncotype = 16 IgG Kappa smoldering myeloma Chronic neuropathy Pernicious Anemia   Past Therapy: Velcade q week dosing (3/1) -- s/p cycle #2 -- d/c on 02/06/2018 for lack of effectiveness S/p XRT to the LEFT breast -- completed on 01/01/2021   Current Therapy:       Femara 2.5 mg po q day -- started on 01/19/2021 Vit B12 1000 mcg IM q month - start on 03/25/2020   Interim History:  [name] is here today for follow-up. She is doing well but does note some fatigue.  She is concerned about her hair loss and plans to follow-up with dermatology to see if there is any way they can help. She had her mammogram last month and states that result is negative. She plans to have them fax [name] the results. I gave her our fax number.  No adenopathy or lymphedema noted on exam.  No changes on self breast exam per patient.  No blood loss noted. No bruising or petechiae.  No fever, chills, n/v, cough, rash, SOB, chest pain, palpitations, abdominal pain or changes in bowel or bladder habits.  She has occasional episodes of dizziness with vertigo and recently finished PT to help with balance.  No falls or syncope reported.  No swelling or tingling in her extremities at this time.  Appetite and hydration are good. Weight is stable at 202 lbs.   ECOG Performance Status: 1 - Symptomatic but completely ambulatory  Medications:  Allergies as of 11/21/2023       Reactions   Dristan [pheniramine-phenylephrine] Palpitations   Other Palpitations   Contact "old cold medicine        Medication List        Accurate as of November 21, 2023 12:48 PM. If you have any questions, ask your nurse or doctor.          albuterol 108 (90 Base) MCG/ACT inhaler Commonly known as: VENTOLIN  HFA Inhale into the lungs every 6 (six) hours as needed for wheezing or shortness of breath.   cyanocobalamin 1000 MCG/ML injection Commonly known as: VITAMIN B12 INJECT 1 ML (1,000 MCG TOTAL) INTO THE MUSCLE ONCE A WEEK FOR 4 DOSES. THEN 1 ML IM MONTHLY.   cyclobenzaprine 5 MG tablet Commonly known as: FLEXERIL Take 1 tablet (5 mg total) by mouth 3 (three) times daily as needed   Dexcom G7 Receiver Devi as directed external check sugars frequently for 90 days   HYDROcodone-acetaminophen 10-325 MG tablet Commonly known as: NORCO Take 1 tablet by mouth 4 (four) times daily as needed for pain.   letrozole 2.5 MG tablet Commonly known as: FEMARA TAKE 1 TABLET BY MOUTH EVERY DAY   meclizine 25 MG tablet Commonly known as: ANTIVERT Take 25 mg by mouth 2 (two) times daily as needed.   metFORMIN 500 MG 24 hr tablet Commonly known as: GLUCOPHAGE-XR Take 1,000 mg by mouth 2 (two) times daily.   metoprolol succinate 100 MG 24 hr tablet Commonly known as: TOPROL-XL Take 100 mg by mouth daily.   morphine 60 MG 12 hr tablet Commonly known as: MS CONTIN Take 1 tablet (60 mg total) by mouth every 12 (twelve) hours.   pravastatin 40 MG tablet Commonly known as: PRAVACHOL Take 40 mg by mouth daily.   venlafaxine XR 150 MG 24  hr capsule Commonly known as: EFFEXOR-XR Take 150 mg by mouth daily with breakfast.   Vitamin D (Ergocalciferol) 1.25 MG (50000 UNIT) Caps capsule Commonly known as: DRISDOL TAKE 1 CAPSULE BY MOUTH ONE TIME PER WEEK        Allergies:  Allergies  Allergen Reactions   Dristan [Pheniramine-Phenylephrine] Palpitations   Other Palpitations    Contact "old cold medicine    Past Medical History, Surgical history, Social history, and Family History were reviewed and updated.  Review of Systems: All other 10 point review of systems is negative.   Physical Exam:  vitals were not taken for this visit.   Wt Readings from Last 3 Encounters:  07/24/23 204  lb 6 oz (92.7 kg)  07/20/23 205 lb (93 kg)  01/16/23 203 lb 8 oz (92.3 kg)    Ocular: Sclerae unicteric, pupils equal, round and reactive to light Ear-nose-throat: Oropharynx clear, dentition fair Lymphatic: No cervical, supraclavicular or axillary adenopathy Lungs no rales or rhonchi, good excursion bilaterally Heart regular rate and rhythm, no murmur appreciated Abd soft, nontender, positive bowel sounds MSK no focal spinal tenderness, no joint edema Neuro: non-focal, well-oriented, appropriate affect Breasts: Same as above.   Lab Results  Component Value Date   WBC 9.7 07/20/2023   HGB 13.0 07/20/2023   HCT 40.8 07/20/2023   MCV 86.6 07/20/2023   PLT 383 07/20/2023   Lab Results  Component Value Date   FERRITIN 55 10/03/2019   IRON 68 10/03/2019   TIBC 389 10/03/2019   UIBC 321 10/03/2019   IRONPCTSAT 17 (L) 10/03/2019   Lab Results  Component Value Date   RBC 4.71 07/20/2023   Lab Results  Component Value Date   KPAFRELGTCHN 37.6 (H) 07/20/2023   LAMBDASER 30.9 (H) 07/20/2023   KAPLAMBRATIO 1.22 07/20/2023   Lab Results  Component Value Date   IGGSERUM 1,572 07/20/2023   IGA 63 (L) 07/20/2023   IGMSERUM 56 07/20/2023   Lab Results  Component Value Date   TOTALPROTELP 7.6 07/20/2023   ALBUMINELP 3.9 07/20/2023   A1GS 0.2 07/20/2023   A2GS 0.9 07/20/2023   BETS 1.3 07/20/2023   BETA2SER 0.3 02/20/2015   GAMS 1.4 07/20/2023   MSPIKE 0.8 (H) 07/20/2023   SPEI Comment 07/20/2023     Chemistry      Component Value Date/Time   NA 141 07/20/2023 1114   NA 139 03/23/2020 1220   NA 140 03/27/2017 0804   K 3.7 07/20/2023 1114   K 3.7 03/27/2017 0804   CL 101 07/20/2023 1114   CL 94 (L) 02/20/2015 1010   CO2 31 07/20/2023 1114   CO2 28 03/27/2017 0804   BUN 16 07/20/2023 1114   BUN 18 03/23/2020 1220   BUN 14.9 03/27/2017 0804   CREATININE 0.76 07/20/2023 1114   CREATININE 0.9 03/27/2017 0804      Component Value Date/Time   CALCIUM 10.3  07/20/2023 1114   CALCIUM 10.0 03/27/2017 0804   ALKPHOS 51 07/20/2023 1114   ALKPHOS 69 03/27/2017 0804   AST 18 07/20/2023 1114   AST 26 03/27/2017 0804   ALT 14 07/20/2023 1114   ALT 28 03/27/2017 0804   BILITOT 0.5 07/20/2023 1114   BILITOT 0.76 03/27/2017 0804       Impression and Plan: [name] is 73 yo postmenopausal white female with history of stage Ia ductal carcinoma of the left breast with low Oncotype score. She had lumpectomy. She completed 2 cycle of Velcade which was not effective. She  then completed radiation to the left breast.  She is now on Femara and tolerating nicely. She will continue her same regimen.  Monthly B 12 injection done herself at home. Level pending.  Protein studies are pending to monitor smoldering myeloma.  Follow-up in 4 months.   Eileen Stanford, NP 3/11/202512:48 PM

## 2023-11-22 LAB — IGG, IGA, IGM
IgA: 65 mg/dL (ref 64–422)
IgG (Immunoglobin G), Serum: 1445 mg/dL (ref 586–1602)
IgM (Immunoglobulin M), Srm: 54 mg/dL (ref 26–217)

## 2023-11-22 LAB — KAPPA/LAMBDA LIGHT CHAINS
Kappa free light chain: 37 mg/L — ABNORMAL HIGH (ref 3.3–19.4)
Kappa, lambda light chain ratio: 1.2 (ref 0.26–1.65)
Lambda free light chains: 30.9 mg/L — ABNORMAL HIGH (ref 5.7–26.3)

## 2023-11-23 LAB — PROTEIN ELECTROPHORESIS, SERUM
A/G Ratio: 1.1 (ref 0.7–1.7)
Albumin ELP: 3.8 g/dL (ref 2.9–4.4)
Alpha-1-Globulin: 0.2 g/dL (ref 0.0–0.4)
Alpha-2-Globulin: 0.9 g/dL (ref 0.4–1.0)
Beta Globulin: 1.2 g/dL (ref 0.7–1.3)
Gamma Globulin: 1.3 g/dL (ref 0.4–1.8)
Globulin, Total: 3.6 g/dL (ref 2.2–3.9)
M-Spike, %: 0.7 g/dL — ABNORMAL HIGH
Total Protein ELP: 7.4 g/dL (ref 6.0–8.5)

## 2023-11-24 ENCOUNTER — Encounter: Payer: Self-pay | Admitting: *Deleted

## 2023-11-29 ENCOUNTER — Other Ambulatory Visit (HOSPITAL_COMMUNITY): Payer: Self-pay

## 2023-11-30 ENCOUNTER — Other Ambulatory Visit (HOSPITAL_COMMUNITY): Payer: Self-pay

## 2023-11-30 MED ORDER — HYDROCODONE-ACETAMINOPHEN 10-325 MG PO TABS
1.0000 | ORAL_TABLET | Freq: Four times a day (QID) | ORAL | 0 refills | Status: DC | PRN
  Filled 2023-11-30: qty 60, 15d supply, fill #0

## 2023-12-06 ENCOUNTER — Other Ambulatory Visit (HOSPITAL_COMMUNITY): Payer: Self-pay

## 2023-12-07 ENCOUNTER — Encounter (INDEPENDENT_AMBULATORY_CARE_PROVIDER_SITE_OTHER): Payer: Self-pay

## 2023-12-11 ENCOUNTER — Encounter: Payer: Self-pay | Admitting: Family

## 2023-12-13 ENCOUNTER — Ambulatory Visit (INDEPENDENT_AMBULATORY_CARE_PROVIDER_SITE_OTHER): Admitting: Podiatry

## 2023-12-13 DIAGNOSIS — Z91198 Patient's noncompliance with other medical treatment and regimen for other reason: Secondary | ICD-10-CM

## 2023-12-13 NOTE — Progress Notes (Signed)
 1. Failure to attend appointment with reason given   Patient canceled appt. Having migraine headaches.

## 2024-01-09 ENCOUNTER — Encounter: Payer: Self-pay | Admitting: Podiatry

## 2024-01-09 ENCOUNTER — Ambulatory Visit: Admitting: Podiatry

## 2024-01-09 VITALS — Ht 66.0 in | Wt 202.0 lb

## 2024-01-09 DIAGNOSIS — M79674 Pain in right toe(s): Secondary | ICD-10-CM | POA: Diagnosis not present

## 2024-01-09 DIAGNOSIS — L84 Corns and callosities: Secondary | ICD-10-CM | POA: Diagnosis not present

## 2024-01-09 DIAGNOSIS — Z794 Long term (current) use of insulin: Secondary | ICD-10-CM

## 2024-01-09 DIAGNOSIS — E1142 Type 2 diabetes mellitus with diabetic polyneuropathy: Secondary | ICD-10-CM

## 2024-01-09 DIAGNOSIS — B351 Tinea unguium: Secondary | ICD-10-CM

## 2024-01-09 DIAGNOSIS — M79675 Pain in left toe(s): Secondary | ICD-10-CM

## 2024-01-09 NOTE — Progress Notes (Signed)
  Subjective:  Patient ID: [name], female    DOB: 1951-04-26,  MRN: [id number]  [name] presents to clinic today for: at risk foot care with history of diabetic neuropathy and callus(es) right foot and painful mycotic toenails that are difficult to trim. Painful toenails interfere with ambulation. Aggravating factors include wearing enclosed shoe gear. Pain is relieved with periodic professional debridement. Painful calluses are aggravated when weightbearing with and without shoegear. Pain is relieved with periodic professional debridement.  Chief Complaint  Patient presents with   Diabetes    Patient is here for routine foot care, 7.4 mgdL    PCP is Ennever, Sherryll Donald, MD.  Allergies  Allergen Reactions   Dristan [Pheniramine-Phenylephrine ] Palpitations   Other Palpitations    Contact "old cold medicine    Review of Systems: Negative except as noted in the HPI.  Objective: No changes noted in today's physical examination. There were no vitals filed for this visit.  [name] is a pleasant 73 y.o. female in NAD. AAO x 3.  Vascular Examination: Capillary refill time <3 seconds b/l LE. Palpable pedal pulses b/l LE. Digital hair present b/l. No pedal edema b/l. Skin temperature gradient WNL b/l. No varicosities b/l. No cyanosis or clubbing noted b/l LE.Aaron Aas  Dermatological Examination: Pedal skin with normal turgor, texture and tone b/l. No open wounds. No interdigital macerations b/l. Toenails 1-5 b/l thickened, discolored, dystrophic with subungual debris. There is pain on palpation to dorsal aspect of nailplates. Hyperkeratotic lesion(s) right great toe.  No erythema, no edema, no drainage, no fluctuance..  Neurological Examination: Protective sensation intact with 10 gram monofilament b/l LE. Vibratory sensation intact b/l LE. Pt has subjective symptoms of neuropathy.  Musculoskeletal Examination: Muscle strength 5/5 to all lower extremity muscle groups  bilaterally. HAV with bunion bilaterally and hammertoes 2-5 b/l.  Assessment/Plan: 1. Pain due to onychomycosis of toenails of both feet   2. Callus   3. Type 2 diabetes mellitus with diabetic polyneuropathy, with long-term current use of insulin  [name])     Consent given for treatment. Patient examined. All patient's and/or POA's questions/concerns addressed on today's visit. Mycotic toenails 1-5 debrided in length and girth without incident. Callus(es) right great toe pared with sharp debridement without incident. Continue foot and shoe inspections daily. Monitor blood glucose per PCP/Endocrinologist's recommendations.Continue soft, supportive shoe gear daily. Report any pedal injuries to medical professional. Call office if there are any quesitons/concerns. -Patient/POA to call should there be question/concern in the interim.   Return in about 3 months (around 04/09/2024).  [name], DPM      [facility] LOCATION: [address], [place] [address]                   Office [phone]   [facility] LOCATION: [address] [place], [place] [address] Office [phone]

## 2024-01-22 ENCOUNTER — Ambulatory Visit: Payer: Medicare Other

## 2024-03-04 ENCOUNTER — Ambulatory Visit: Attending: General Surgery

## 2024-03-04 VITALS — Wt 195.5 lb

## 2024-03-04 DIAGNOSIS — Z483 Aftercare following surgery for neoplasm: Secondary | ICD-10-CM | POA: Insufficient documentation

## 2024-03-04 NOTE — Therapy (Signed)
 OUTPATIENT PHYSICAL THERAPY SOZO SCREENING NOTE   Patient Name: [name] MRN: [id number] DOB:1951-05-18, 73 y.o., female Today's Date: 03/04/2024  PCP: Timmy Maude SAUNDERS, MD REFERRING PROVIDER: Curvin Deward MOULD, MD   PT End of Session - 03/04/24 1653     Visit Number 9   # unchanged due to screen only   PT Start Time 1651    PT Stop Time 1655    PT Time Calculation (min) 4 min    Activity Tolerance Patient tolerated treatment well    Behavior During Therapy WFL for tasks assessed/performed          Past Medical History:  Diagnosis Date   Allergies    Anxiety    Asthma    treated by pulmonologist    Atherosclerosis of aorta (HCC)    noted on xray   Breast cancer (HCC)    Right   Chronic pain    Depression    Diabetes (HCC)    type II   Edema    Goals of care, counseling/discussion 09/02/2020   High cholesterol    History of nuclear stress test 11/12/2009   normal    Hypertension    IgG monoclonal gammopathy of uncertain significance    igG kappa monoclonal gammopathy of unkown significance (per notes from [facility]).   Insomnia    Low back pain    Migraine headache    Neuropathy associated with MGUS (HCC)    Peripheral neuropathy    Pernicious anemia 05/20/2021   PONV (postoperative nausea and vomiting)    hard to wake up   Sleep apnea    Smoldering multiple myeloma 12/04/2017   Vertigo    Past Surgical History:  Procedure Laterality Date   BACK SURGERY  2019   T10-S1 Fusion Dr. Gust SANES SURGERY  2019   2 fusion in lower back (L4-S1) and neck   breast cyst removal age 89     BREAST LUMPECTOMY WITH RADIOACTIVE SEED AND SENTINEL LYMPH NODE BIOPSY Left 09/30/2020   Procedure: RADIOACTIVE SEED GUIDED LEFT BREAST LUMPECTOMY, LEFT AXILLARY SENTINEL LYMPH NODE BIOPSY;  Surgeon: Curvin Deward MOULD, MD;  Location: [facility];  Service: General;  Laterality: Left;   CESAREAN SECTION     x2   hernia repair at 60 months old      miscarriage D&C     NASAL SINUS SURGERY     x2   NECK SURGERY     pain stimulator in back     RE-EXCISION OF BREAST CANCER,SUPERIOR MARGINS Left 10/19/2020   Procedure: RE-EXCISION LEFT BREAST MARGINS;  Surgeon: Curvin Deward MOULD, MD;  Location: [facility];  Service: General;  Laterality: Left;   TONSILLECTOMY     Patient Active Problem List   Diagnosis Date Noted   Pernicious anemia 05/20/2021   Goals of care, counseling/discussion 09/02/2020   Malignant neoplasm of upper-outer quadrant of left breast in female, estrogen receptor positive (HCC) 08/27/2020   Iron  deficiency anemia 10/09/2019   Impaired mobility and activities of daily living 08/30/2018   Multiple myeloma (HCC) 08/30/2018   S/P spinal fusion 08/30/2018   Lumbar degenerative disc disease 08/27/2018   History of degenerative disc disease 08/15/2018   Neck pain 08/15/2018   Sensorineural hearing loss (SNHL), bilateral 08/15/2018   Tinnitus of both ears 08/15/2018   Smoldering multiple myeloma 12/04/2017   Breakdown (mechanical) of implanted electronic neurostimulator, generator, initial encounter (HCC) 08/12/2016   Myalgia 08/12/2016   Post laminectomy  syndrome 08/12/2016   Migraine with aura and without status migrainosus, not intractable 10/15/2015   Intrinsic asthma 01/11/2010   Dyspnea 12/14/2009   DIABETES MELLITUS, TYPE II 12/11/2009   Hyperlipidemia 12/11/2009   MIGRAINE HEADACHE 12/11/2009   Hereditary and idiopathic peripheral neuropathy 12/11/2009   Hypertension 12/11/2009   Insomnia 12/11/2009   EDEMA 12/11/2009   Type 2 diabetes mellitus with diabetic polyneuropathy (HCC) 12/11/2009    REFERRING DIAG: left breast cancer at risk for lymphedema  THERAPY DIAG: Aftercare following surgery for neoplasm  PERTINENT HISTORY: Recent diagnosis of Lt breast cancer IDC stage IA with surgery 09/30/20 with 3 lymph nodes removed and then re-excision 10/19/20.  pt is followed by Dr. Timmy due to  smoldering igG Kappa myeloma.  Other history includes: DM, Peripheral neuropathy, and HTN, cervical fusion with limited movement and back T10-S1, vertigo, recent fall with hand fracture   PRECAUTIONS: left UE Lymphedema risk, None  SUBJECTIVE: Pt returns for her 6 month L-Dex screen.   PAIN:  Are you having pain? No  SOZO SCREENING: Patient was assessed today using the SOZO machine to determine the lymphedema index score. This was compared to her baseline score. It was determined that she is within the recommended range when compared to her baseline and no further action is needed at this time. She will continue SOZO screenings. These are done every 3 months for 2 years post operatively followed by every 6 months for 2 years, and then annually. Assisted pts with doffing and donning socks and shoes.    L-DEX FLOWSHEETS - 03/04/24 1600       L-DEX LYMPHEDEMA SCREENING   Measurement Type Unilateral    L-DEX MEASUREMENT EXTREMITY Upper Extremity    POSITION  Standing    DOMINANT SIDE Right    At Risk Side Left    BASELINE SCORE (UNILATERAL) -2.6    L-DEX SCORE (UNILATERAL) -0.7    VALUE CHANGE (UNILAT) 1.9           Aden Berwyn Caldron, PTA 03/04/2024, 4:55 PM

## 2024-03-26 ENCOUNTER — Encounter: Payer: Self-pay | Admitting: *Deleted

## 2024-03-26 ENCOUNTER — Inpatient Hospital Stay (HOSPITAL_BASED_OUTPATIENT_CLINIC_OR_DEPARTMENT_OTHER): Admitting: Medical Oncology

## 2024-03-26 ENCOUNTER — Inpatient Hospital Stay: Attending: Hematology & Oncology

## 2024-03-26 ENCOUNTER — Encounter: Payer: Self-pay | Admitting: Medical Oncology

## 2024-03-26 VITALS — BP 140/55 | HR 96 | Temp 98.8°F | Resp 20 | Ht 66.0 in | Wt 195.0 lb

## 2024-03-26 DIAGNOSIS — Z170 Estrogen receptor positive status [ER+]: Secondary | ICD-10-CM | POA: Diagnosis not present

## 2024-03-26 DIAGNOSIS — Z79811 Long term (current) use of aromatase inhibitors: Secondary | ICD-10-CM

## 2024-03-26 DIAGNOSIS — C50412 Malignant neoplasm of upper-outer quadrant of left female breast: Secondary | ICD-10-CM | POA: Diagnosis present

## 2024-03-26 DIAGNOSIS — D510 Vitamin B12 deficiency anemia due to intrinsic factor deficiency: Secondary | ICD-10-CM | POA: Diagnosis not present

## 2024-03-26 DIAGNOSIS — R519 Headache, unspecified: Secondary | ICD-10-CM | POA: Insufficient documentation

## 2024-03-26 DIAGNOSIS — Z79899 Other long term (current) drug therapy: Secondary | ICD-10-CM | POA: Diagnosis not present

## 2024-03-26 DIAGNOSIS — Z1732 Human epidermal growth factor receptor 2 negative status: Secondary | ICD-10-CM | POA: Diagnosis not present

## 2024-03-26 DIAGNOSIS — H539 Unspecified visual disturbance: Secondary | ICD-10-CM

## 2024-03-26 DIAGNOSIS — D472 Monoclonal gammopathy: Secondary | ICD-10-CM | POA: Insufficient documentation

## 2024-03-26 DIAGNOSIS — G629 Polyneuropathy, unspecified: Secondary | ICD-10-CM | POA: Insufficient documentation

## 2024-03-26 DIAGNOSIS — Z1721 Progesterone receptor positive status: Secondary | ICD-10-CM | POA: Insufficient documentation

## 2024-03-26 DIAGNOSIS — Z923 Personal history of irradiation: Secondary | ICD-10-CM | POA: Diagnosis not present

## 2024-03-26 DIAGNOSIS — C50419 Malignant neoplasm of upper-outer quadrant of unspecified female breast: Secondary | ICD-10-CM | POA: Diagnosis not present

## 2024-03-26 LAB — VITAMIN B12: Vitamin B-12: 285 pg/mL (ref 180–914)

## 2024-03-26 LAB — CBC WITH DIFFERENTIAL (CANCER CENTER ONLY)
Abs Immature Granulocytes: 0.03 K/uL (ref 0.00–0.07)
Basophils Absolute: 0.1 K/uL (ref 0.0–0.1)
Basophils Relative: 1 %
Eosinophils Absolute: 0.5 K/uL (ref 0.0–0.5)
Eosinophils Relative: 5 %
HCT: 40.3 % (ref 36.0–46.0)
Hemoglobin: 12.9 g/dL (ref 12.0–15.0)
Immature Granulocytes: 0 %
Lymphocytes Relative: 40 %
Lymphs Abs: 4.6 K/uL — ABNORMAL HIGH (ref 0.7–4.0)
MCH: 27.5 pg (ref 26.0–34.0)
MCHC: 32 g/dL (ref 30.0–36.0)
MCV: 85.9 fL (ref 80.0–100.0)
Monocytes Absolute: 1 K/uL (ref 0.1–1.0)
Monocytes Relative: 8 %
Neutro Abs: 5.4 K/uL (ref 1.7–7.7)
Neutrophils Relative %: 46 %
Platelet Count: 399 K/uL (ref 150–400)
RBC: 4.69 MIL/uL (ref 3.87–5.11)
RDW: 13.3 % (ref 11.5–15.5)
WBC Count: 11.7 K/uL — ABNORMAL HIGH (ref 4.0–10.5)
nRBC: 0 % (ref 0.0–0.2)

## 2024-03-26 LAB — CMP (CANCER CENTER ONLY)
ALT: 9 U/L (ref 0–44)
AST: 15 U/L (ref 15–41)
Albumin: 4.8 g/dL (ref 3.5–5.0)
Alkaline Phosphatase: 64 U/L (ref 38–126)
Anion gap: 11 (ref 5–15)
BUN: 24 mg/dL — ABNORMAL HIGH (ref 8–23)
CO2: 27 mmol/L (ref 22–32)
Calcium: 10.5 mg/dL — ABNORMAL HIGH (ref 8.9–10.3)
Chloride: 102 mmol/L (ref 98–111)
Creatinine: 0.87 mg/dL (ref 0.44–1.00)
GFR, Estimated: >60 mL/min (ref >60)
Glucose, Bld: 146 mg/dL — ABNORMAL HIGH (ref 70–99)
Potassium: 3.8 mmol/L (ref 3.5–5.1)
Sodium: 140 mmol/L (ref 135–145)
Total Bilirubin: 0.6 mg/dL (ref 0.0–1.2)
Total Protein: 8 g/dL (ref 6.5–8.1)

## 2024-03-26 LAB — LACTATE DEHYDROGENASE: LDH: 154 U/L (ref 98–192)

## 2024-03-26 NOTE — Progress Notes (Signed)
 Hematology and Oncology Follow Up Visit  [name] [id number] 01/05/1951 73 y.o. 03/26/2024   Principle Diagnosis:  Stage IA (T1cN0M0) infiltrating ductal carcinoma of the LEFT breast -- ER+/PR+/HER2- -- Oncotype = 16 IgG Kappa smoldering myeloma Chronic neuropathy Pernicious Anemia   Past Therapy: Velcade  q week dosing (3/1) -- s/p cycle #2 -- d/c on 02/06/2018 for lack of effectiveness S/p XRT to the LEFT breast -- completed on 01/01/2021   Current Therapy:       Femara  2.5 mg po q day -- started on 01/19/2021 Vit B12 1000 mcg IM q month - start on 03/25/2020   Interim History:  [name] is here today for follow-up.   Today she states that she has been just ok. She has noticed an increase of headaches for the past month along with some visual changes and vertigo. She has seen neurology in the past for her neuropathy but did not find this helpful and did not like gabapentin . She does report 2 falls since her last visit. She has done strengthening PT which she has found helpful but is considering transitioning to a rolling walker.  No adenopathy or lymphedema noted on exam.  No changes on self breast exam per patient.  No blood loss noted. No bruising or petechiae.  No fever, chills, n/v, cough, rash, SOB, chest pain, palpitations, abdominal pain or changes in bowel or bladder habits.  No falls or syncope reported.  No swelling or tingling in her extremities at this time.  Appetite and hydration are good.   Wt Readings from Last 3 Encounters:  03/26/24 195 lb (88.5 kg)  03/04/24 195 lb 8 oz (88.7 kg)  01/09/24 202 lb (91.6 kg)     ECOG Performance Status: 1 - Symptomatic but completely ambulatory  Medications:  Allergies as of 03/26/2024       Reactions   Dristan [pheniramine-phenylephrine ] Palpitations   Other Palpitations   Contact old cold medicine        Medication List        Accurate as of March 26, 2024  3:03 PM. If you have any questions,  ask your nurse or doctor.          albuterol  108 (90 Base) MCG/ACT inhaler Commonly known as: VENTOLIN  HFA Inhale into the lungs every 6 (six) hours as needed for wheezing or shortness of breath.   amitriptyline 10 MG tablet Commonly known as: ELAVIL Take 10 mg by mouth at bedtime.   cyclobenzaprine  5 MG tablet Commonly known as: FLEXERIL  Take 1 tablet (5 mg total) by mouth 3 (three) times daily as needed   Dexcom G7 Receiver Devi   HYDROcodone -acetaminophen  10-325 MG tablet Commonly known as: NORCO Take 1 tablet by mouth 4 (four) times daily as needed for pain   letrozole  2.5 MG tablet Commonly known as: FEMARA  TAKE 1 TABLET BY MOUTH EVERY DAY   MAGNESIUM  GLYCINATE PO Take by mouth at bedtime.   meclizine  25 MG tablet Commonly known as: ANTIVERT  Take 25 mg by mouth 2 (two) times daily as needed.   metFORMIN 500 MG 24 hr tablet Commonly known as: GLUCOPHAGE-XR Take 1,000 mg by mouth 2 (two) times daily.   metoprolol  succinate 100 MG 24 hr tablet Commonly known as: TOPROL -XL Take 100 mg by mouth daily.   morphine  60 MG 12 hr tablet Commonly known as: MS CONTIN  Take 1 tablet (60 mg total) by mouth every 12 (twelve) hours.   pravastatin 40 MG tablet Commonly known as: PRAVACHOL Take  40 mg by mouth daily.   venlafaxine  XR 150 MG 24 hr capsule Commonly known as: EFFEXOR -XR Take 150 mg by mouth daily with breakfast.   Vitamin D  (Ergocalciferol ) 1.25 MG (50000 UNIT) Caps capsule Commonly known as: DRISDOL  TAKE 1 CAPSULE BY MOUTH ONE TIME PER WEEK        Allergies:  Allergies  Allergen Reactions   Dristan [Pheniramine-Phenylephrine ] Palpitations   Other Palpitations    Contact old cold medicine    Past Medical History, Surgical history, Social history, and Family History were reviewed and updated.  Review of Systems: All other 10 point review of systems is negative.   Physical Exam:  height is 5' 6 (1.676 m) and weight is 195 lb (88.5 kg). Her  oral temperature is 98.8 F (37.1 C). Her blood pressure is 140/55 (abnormal) and her pulse is 96. Her respiration is 20 and oxygen saturation is 94%.   Wt Readings from Last 3 Encounters:  03/26/24 195 lb (88.5 kg)  03/04/24 195 lb 8 oz (88.7 kg)  01/09/24 202 lb (91.6 kg)    Ocular: Sclerae unicteric, pupils equal, round and reactive to light Ear-nose-throat: Oropharynx clear, dentition fair Lymphatic: No cervical, supraclavicular or axillary adenopathy Lungs no rales or rhonchi, good excursion bilaterally Heart regular rate and rhythm, no murmur appreciated Abd soft, nontender, positive bowel sounds MSK no focal spinal tenderness, no joint edema Neuro: non-focal, well-oriented, appropriate affect Breasts: Same as above.   Lab Results  Component Value Date   WBC 11.7 (H) 03/26/2024   HGB 12.9 03/26/2024   HCT 40.3 03/26/2024   MCV 85.9 03/26/2024   PLT 399 03/26/2024   Lab Results  Component Value Date   FERRITIN 55 10/03/2019   IRON  68 10/03/2019   TIBC 389 10/03/2019   UIBC 321 10/03/2019   IRONPCTSAT 17 (L) 10/03/2019   Lab Results  Component Value Date   RBC 4.69 03/26/2024   Lab Results  Component Value Date   KPAFRELGTCHN 37.0 (H) 11/21/2023   LAMBDASER 30.9 (H) 11/21/2023   KAPLAMBRATIO 1.20 11/21/2023   Lab Results  Component Value Date   IGGSERUM 1,445 11/21/2023   IGA 65 11/21/2023   IGMSERUM 54 11/21/2023   Lab Results  Component Value Date   TOTALPROTELP 7.4 11/21/2023   ALBUMINELP 3.8 11/21/2023   A1GS 0.2 11/21/2023   A2GS 0.9 11/21/2023   BETS 1.2 11/21/2023   BETA2SER 0.3 02/20/2015   GAMS 1.3 11/21/2023   MSPIKE 0.7 (H) 11/21/2023   SPEI Comment 11/21/2023     Chemistry      Component Value Date/Time   NA 138 11/21/2023 1237   NA 139 03/23/2020 1220   NA 140 03/27/2017 0804   K 3.6 11/21/2023 1237   K 3.7 03/27/2017 0804   CL 98 11/21/2023 1237   CL 94 (L) 02/20/2015 1010   CO2 31 11/21/2023 1237   CO2 28 03/27/2017 0804    BUN 18 11/21/2023 1237   BUN 18 03/23/2020 1220   BUN 14.9 03/27/2017 0804   CREATININE 0.86 11/21/2023 1237   CREATININE 0.9 03/27/2017 0804      Component Value Date/Time   CALCIUM  10.1 11/21/2023 1237   CALCIUM  10.0 03/27/2017 0804   ALKPHOS 51 11/21/2023 1237   ALKPHOS 69 03/27/2017 0804   AST 18 11/21/2023 1237   AST 26 03/27/2017 0804   ALT 14 11/21/2023 1237   ALT 28 03/27/2017 0804   BILITOT 0.6 11/21/2023 1237   BILITOT 0.76 03/27/2017 0804  Encounter Diagnoses  Name Primary?   Smoldering multiple myeloma Yes   Pernicious anemia    Malignant neoplasm of upper-outer quadrant of breast in female, estrogen receptor positive, unspecified laterality (HCC)    Prophylactic use of letrozole  (Femara )     Impression and Plan: Ms. Leiner is 73 yo postmenopausal white female with history of stage Ia ductal carcinoma of the left breast with low Oncotype score. She had lumpectomy. She completed 2 cycle of Velcade  which was not effective. She then completed radiation to the left breast.   She is now on Femara  and tolerating nicely.  She will continue her same regimen.  Monthly B 12 injection administered at home Last mammogram was on 10/19/2023 BI-RADS 2 Last DEXA was on 01/03/2023- Tscore of -0.5 Given her headaches, elevated calcium , visual changes will evaluate for causes of hypercalcemia and obtain a brain MRI w/ w/o contrast  She will use a rolling walker for assistance and we discussed fall prevention  Protein studies are pending to monitor smoldering myeloma.   RTC 1 months me, labs (CBC w/, CMP, LDH, MM panel, light chains, B12, PTH, PTHrP, TSH, Vitamin D )  Lauraine CHRISTELLA Dais, PA-C 7/15/20253:03 PM

## 2024-03-27 LAB — KAPPA/LAMBDA LIGHT CHAINS
Kappa free light chain: 45.1 mg/L — ABNORMAL HIGH (ref 3.3–19.4)
Kappa, lambda light chain ratio: 1.24 (ref 0.26–1.65)
Lambda free light chains: 36.3 mg/L — ABNORMAL HIGH (ref 5.7–26.3)

## 2024-03-27 LAB — IGG, IGA, IGM
IgA: 67 mg/dL (ref 64–422)
IgG (Immunoglobin G), Serum: 1601 mg/dL (ref 586–1602)
IgM (Immunoglobulin M), Srm: 51 mg/dL (ref 26–217)

## 2024-03-27 LAB — TSH: TSH: 2.14 u[IU]/mL (ref 0.350–4.500)

## 2024-03-28 ENCOUNTER — Ambulatory Visit: Payer: Self-pay | Admitting: Medical Oncology

## 2024-03-28 LAB — PROTEIN ELECTROPHORESIS, SERUM
A/G Ratio: 1 (ref 0.7–1.7)
Albumin ELP: 3.9 g/dL (ref 2.9–4.4)
Alpha-1-Globulin: 0.2 g/dL (ref 0.0–0.4)
Alpha-2-Globulin: 0.9 g/dL (ref 0.4–1.0)
Beta Globulin: 1.3 g/dL (ref 0.7–1.3)
Gamma Globulin: 1.5 g/dL (ref 0.4–1.8)
Globulin, Total: 3.8 g/dL (ref 2.2–3.9)
M-Spike, %: 1 g/dL — ABNORMAL HIGH
Total Protein ELP: 7.7 g/dL (ref 6.0–8.5)

## 2024-04-03 ENCOUNTER — Ambulatory Visit: Payer: Self-pay | Admitting: Medical Oncology

## 2024-04-09 ENCOUNTER — Other Ambulatory Visit: Payer: Self-pay | Admitting: Medical Oncology

## 2024-04-09 DIAGNOSIS — F419 Anxiety disorder, unspecified: Secondary | ICD-10-CM

## 2024-04-09 MED ORDER — DIAZEPAM 5 MG PO TABS: ORAL_TABLET | ORAL | 0 refills | Status: DC

## 2024-04-09 MED ORDER — NALOXONE HCL 4 MG/0.1ML NA LIQD: NASAL | 0 refills | Status: DC

## 2024-04-16 ENCOUNTER — Other Ambulatory Visit: Payer: Self-pay

## 2024-04-16 ENCOUNTER — Ambulatory Visit: Admitting: Podiatry

## 2024-04-16 DIAGNOSIS — F419 Anxiety disorder, unspecified: Secondary | ICD-10-CM

## 2024-04-16 MED ORDER — NALOXONE HCL 4 MG/0.1ML NA LIQD: NASAL | 0 refills | Status: AC

## 2024-04-16 MED ORDER — DIAZEPAM 5 MG PO TABS
ORAL_TABLET | ORAL | 0 refills | Status: DC
Start: 2024-04-16 — End: 2024-04-19

## 2024-04-16 MED ORDER — DIAZEPAM 5 MG PO TABS
ORAL_TABLET | ORAL | 0 refills | Status: DC
Start: 2024-04-16 — End: 2024-04-16

## 2024-04-19 ENCOUNTER — Other Ambulatory Visit: Payer: Self-pay | Admitting: *Deleted

## 2024-04-19 ENCOUNTER — Other Ambulatory Visit: Payer: Self-pay | Admitting: Hematology & Oncology

## 2024-04-19 DIAGNOSIS — F419 Anxiety disorder, unspecified: Secondary | ICD-10-CM

## 2024-04-19 MED ORDER — DIAZEPAM 5 MG PO TABS: ORAL_TABLET | ORAL | 0 refills | Status: DC

## 2024-04-21 ENCOUNTER — Ambulatory Visit (HOSPITAL_BASED_OUTPATIENT_CLINIC_OR_DEPARTMENT_OTHER)
Admission: RE | Admit: 2024-04-21 | Discharge: 2024-04-21 | Disposition: A | Discharge status: Home / Self Care | Source: Ambulatory Visit | Attending: Medical Oncology | Admitting: Medical Oncology

## 2024-04-21 DIAGNOSIS — R519 Headache, unspecified: Secondary | ICD-10-CM | POA: Insufficient documentation

## 2024-04-21 DIAGNOSIS — H539 Unspecified visual disturbance: Secondary | ICD-10-CM | POA: Insufficient documentation

## 2024-04-21 DIAGNOSIS — C50419 Malignant neoplasm of upper-outer quadrant of unspecified female breast: Secondary | ICD-10-CM | POA: Diagnosis present

## 2024-04-21 DIAGNOSIS — D472 Monoclonal gammopathy: Secondary | ICD-10-CM | POA: Insufficient documentation

## 2024-04-21 DIAGNOSIS — Z170 Estrogen receptor positive status [ER+]: Secondary | ICD-10-CM | POA: Diagnosis present

## 2024-04-21 MED ORDER — GADOBUTROL 1 MMOL/ML IV SOLN
9.0000 mL | Freq: Once | INTRAVENOUS | Status: AC | PRN
  Administered 2024-04-21: 9 mL via INTRAVENOUS

## 2024-04-24 ENCOUNTER — Telehealth: Payer: Self-pay | Admitting: *Deleted

## 2024-04-24 NOTE — Telephone Encounter (Signed)
 Medication Prior Authorization Status  Processed CoverMyMeds KEY: [id number] for DIAZEPAM  5 MG  Approved Today  Per [facility] Medicare   PA Case ID: [id number]   Effective 04/09/2024 through 12/10/225.

## 2024-04-29 ENCOUNTER — Inpatient Hospital Stay: Attending: Hematology & Oncology

## 2024-04-29 ENCOUNTER — Inpatient Hospital Stay (HOSPITAL_BASED_OUTPATIENT_CLINIC_OR_DEPARTMENT_OTHER): Admitting: Medical Oncology

## 2024-04-29 ENCOUNTER — Encounter: Payer: Self-pay | Admitting: Medical Oncology

## 2024-04-29 VITALS — BP 145/66 | HR 75 | Temp 98.2°F | Resp 19 | Ht 66.0 in | Wt 199.8 lb

## 2024-04-29 DIAGNOSIS — C50419 Malignant neoplasm of upper-outer quadrant of unspecified female breast: Secondary | ICD-10-CM | POA: Diagnosis not present

## 2024-04-29 DIAGNOSIS — G629 Polyneuropathy, unspecified: Secondary | ICD-10-CM | POA: Diagnosis not present

## 2024-04-29 DIAGNOSIS — Z79811 Long term (current) use of aromatase inhibitors: Secondary | ICD-10-CM | POA: Insufficient documentation

## 2024-04-29 DIAGNOSIS — R519 Headache, unspecified: Secondary | ICD-10-CM | POA: Diagnosis not present

## 2024-04-29 DIAGNOSIS — C50412 Malignant neoplasm of upper-outer quadrant of left female breast: Secondary | ICD-10-CM | POA: Diagnosis present

## 2024-04-29 DIAGNOSIS — D472 Monoclonal gammopathy: Secondary | ICD-10-CM | POA: Diagnosis not present

## 2024-04-29 DIAGNOSIS — Z79899 Other long term (current) drug therapy: Secondary | ICD-10-CM | POA: Insufficient documentation

## 2024-04-29 DIAGNOSIS — D649 Anemia, unspecified: Secondary | ICD-10-CM

## 2024-04-29 DIAGNOSIS — Z1732 Human epidermal growth factor receptor 2 negative status: Secondary | ICD-10-CM | POA: Diagnosis not present

## 2024-04-29 DIAGNOSIS — Z170 Estrogen receptor positive status [ER+]: Secondary | ICD-10-CM | POA: Diagnosis not present

## 2024-04-29 DIAGNOSIS — D510 Vitamin B12 deficiency anemia due to intrinsic factor deficiency: Secondary | ICD-10-CM | POA: Diagnosis not present

## 2024-04-29 DIAGNOSIS — Z923 Personal history of irradiation: Secondary | ICD-10-CM | POA: Diagnosis not present

## 2024-04-29 LAB — CBC WITH DIFFERENTIAL (CANCER CENTER ONLY)
Abs Immature Granulocytes: 0.02 K/uL (ref 0.00–0.07)
Basophils Absolute: 0.1 K/uL (ref 0.0–0.1)
Basophils Relative: 1 %
Eosinophils Absolute: 0.3 K/uL (ref 0.0–0.5)
Eosinophils Relative: 3 %
HCT: 36 % (ref 36.0–46.0)
Hemoglobin: 11.7 g/dL — ABNORMAL LOW (ref 12.0–15.0)
Immature Granulocytes: 0 %
Lymphocytes Relative: 31 %
Lymphs Abs: 2.3 K/uL (ref 0.7–4.0)
MCH: 27.9 pg (ref 26.0–34.0)
MCHC: 32.5 g/dL (ref 30.0–36.0)
MCV: 85.7 fL (ref 80.0–100.0)
Monocytes Absolute: 0.6 K/uL (ref 0.1–1.0)
Monocytes Relative: 8 %
Neutro Abs: 4.3 K/uL (ref 1.7–7.7)
Neutrophils Relative %: 57 %
Platelet Count: 319 K/uL (ref 150–400)
RBC: 4.2 MIL/uL (ref 3.87–5.11)
RDW: 13.1 % (ref 11.5–15.5)
WBC Count: 7.6 K/uL (ref 4.0–10.5)
nRBC: 0 % (ref 0.0–0.2)

## 2024-04-29 LAB — CMP (CANCER CENTER ONLY)
ALT: 12 U/L (ref 0–44)
AST: 21 U/L (ref 15–41)
Albumin: 4.3 g/dL (ref 3.5–5.0)
Alkaline Phosphatase: 55 U/L (ref 38–126)
Anion gap: 12 (ref 5–15)
BUN: 10 mg/dL (ref 8–23)
CO2: 27 mmol/L (ref 22–32)
Calcium: 9.3 mg/dL (ref 8.9–10.3)
Chloride: 99 mmol/L (ref 98–111)
Creatinine: 0.71 mg/dL (ref 0.44–1.00)
GFR, Estimated: >60 mL/min (ref >60)
Glucose, Bld: 154 mg/dL — ABNORMAL HIGH (ref 70–99)
Potassium: 3.5 mmol/L (ref 3.5–5.1)
Sodium: 138 mmol/L (ref 135–145)
Total Bilirubin: 0.4 mg/dL (ref 0.0–1.2)
Total Protein: 7.5 g/dL (ref 6.5–8.1)

## 2024-04-29 LAB — PHOSPHORUS: Phosphorus: 3.5 mg/dL (ref 2.5–4.6)

## 2024-04-29 LAB — MAGNESIUM: Magnesium: 1.6 mg/dL — ABNORMAL LOW (ref 1.7–2.4)

## 2024-04-29 LAB — VITAMIN D 25 HYDROXY (VIT D DEFICIENCY, FRACTURES): Vit D, 25-Hydroxy: 97.51 ng/mL (ref 30–100)

## 2024-04-29 LAB — VITAMIN B12: Vitamin B-12: 206 pg/mL (ref 180–914)

## 2024-04-29 LAB — LACTATE DEHYDROGENASE: LDH: 220 U/L — ABNORMAL HIGH (ref 98–192)

## 2024-04-29 MED ORDER — BD DISP NEEDLES 27G X 1/2" MISC: 1.0000 | 0 refills | Status: DC

## 2024-04-29 MED ORDER — CYANOCOBALAMIN 1000 MCG/ML IJ SOLN: 1000.0000 ug | INTRAMUSCULAR | 3 refills | Status: DC

## 2024-04-29 NOTE — Progress Notes (Signed)
 Hematology and Oncology Follow Up Visit  DESTINE ZIRKLE [id number] 16-Jun-1951 73 y.o. 04/29/2024   Principle Diagnosis:  Stage IA (T1cN0M0) infiltrating ductal carcinoma of the LEFT breast -- ER+/PR+/HER2- -- Oncotype = 16 IgG Kappa smoldering myeloma Chronic neuropathy Pernicious Anemia   Past Therapy: Velcade  q week dosing (3/1) -- s/p cycle #2 -- d/c on 02/06/2018 for lack of effectiveness S/p XRT to the LEFT breast -- completed on 01/01/2021   Current Therapy:       Femara  2.5 mg po q day -- started on 01/19/2021 Vit B12 1000 mcg IM monthly - start on 03/25/2020- switched to Q2 weeks on 04/29/2024   Interim History:  Ms. Pat is here today for follow-up.   She had an MRI brain on 04/21/2024 for increase of headaches for the past month along with some visual changes and vertigo. Results are pending. Symptoms are stable.   Overall she is feeling ok. She has no new concerns.   No adenopathy or lymphedema noted on exam.  No changes on self breast exam per patient.  No blood loss noted. No bruising or petechiae.  No fever, chills, n/v, cough, rash, SOB, chest pain, palpitations, abdominal pain or changes in bowel or bladder habits.  No falls or syncope reported.  No swelling or tingling in her extremities at this time.  Appetite and hydration are good.   Wt Readings from Last 3 Encounters:  04/29/24 199 lb 12.8 oz (90.6 kg)  03/26/24 195 lb (88.5 kg)  03/04/24 195 lb 8 oz (88.7 kg)     ECOG Performance Status: 1 - Symptomatic but completely ambulatory  Medications:  Allergies as of 04/29/2024       Reactions   Dristan [pheniramine-phenylephrine ] Palpitations   Other Palpitations   Contact old cold medicine        Medication List        Accurate as of April 29, 2024  4:19 PM. If you have any questions, ask your nurse or doctor.          STOP taking these medications    amitriptyline 10 MG tablet Commonly known as: ELAVIL Stopped by: Lauraine CHRISTELLA Dais   cyclobenzaprine  5 MG tablet Commonly known as: FLEXERIL  Stopped by: Lauraine CHRISTELLA Dais   diazepam  5 MG tablet Commonly known as: VALIUM  Stopped by: Lauraine CHRISTELLA Dais       TAKE these medications    albuterol  108 (90 Base) MCG/ACT inhaler Commonly known as: VENTOLIN  HFA Inhale into the lungs every 6 (six) hours as needed for wheezing or shortness of breath.   BD Disp Needles 27G X 1/2 Misc Generic drug: NEEDLE (DISP) 27 G 1 each by Does not apply route every 14 (fourteen) days. Started by: Lauraine CHRISTELLA Dais   cyanocobalamin  1000 MCG/ML injection Commonly known as: VITAMIN B12 Inject 1 mL (1,000 mcg total) into the skin every 14 (fourteen) days. Started by: Arpita Fentress M Brandonn Capelli   Dexcom G7 Receiver Devi   HYDROcodone -acetaminophen  10-325 MG tablet Commonly known as: NORCO Take 1 tablet by mouth 4 (four) times daily as needed for pain   letrozole  2.5 MG tablet Commonly known as: FEMARA  TAKE 1 TABLET BY MOUTH EVERY DAY   MAGNESIUM  GLYCINATE PO Take by mouth at bedtime.   meclizine  25 MG tablet Commonly known as: ANTIVERT  Take 25 mg by mouth 2 (two) times daily as needed.   metFORMIN 500 MG 24 hr tablet Commonly known as: GLUCOPHAGE-XR Take 1,000 mg by mouth 2 (two) times daily.  metoprolol  succinate 100 MG 24 hr tablet Commonly known as: TOPROL -XL Take 100 mg by mouth daily.   morphine  60 MG 12 hr tablet Commonly known as: MS CONTIN  Take 1 tablet (60 mg total) by mouth every 12 (twelve) hours.   naloxone  4 MG/0.1ML Liqd nasal spray kit Commonly known as: NARCAN  Administer 1 spray into nostril in the event of respiratory distress or overdose. May repeat in 2-3 minutes if needed.   pravastatin 40 MG tablet Commonly known as: PRAVACHOL Take 40 mg by mouth daily.   venlafaxine  XR 150 MG 24 hr capsule Commonly known as: EFFEXOR -XR Take 150 mg by mouth daily with breakfast.   Vitamin D  (Ergocalciferol ) 1.25 MG (50000 UNIT) Caps capsule Commonly  known as: DRISDOL  TAKE 1 CAPSULE BY MOUTH ONE TIME PER WEEK        Allergies:  Allergies  Allergen Reactions   Dristan [Pheniramine-Phenylephrine ] Palpitations   Other Palpitations    Contact old cold medicine    Past Medical History, Surgical history, Social history, and Family History were reviewed and updated.  Review of Systems: All other 10 point review of systems is negative.   Physical Exam:  height is 5' 6 (1.676 m) and weight is 199 lb 12.8 oz (90.6 kg). Her oral temperature is 98.2 F (36.8 C). Her blood pressure is 145/66 (abnormal) and her pulse is 75. Her respiration is 19 and oxygen saturation is 97%.   Wt Readings from Last 3 Encounters:  04/29/24 199 lb 12.8 oz (90.6 kg)  03/26/24 195 lb (88.5 kg)  03/04/24 195 lb 8 oz (88.7 kg)    Ocular: Sclerae unicteric, pupils equal, round and reactive to light Ear-nose-throat: Oropharynx clear, dentition fair Lymphatic: No cervical, supraclavicular or axillary adenopathy Lungs no rales or rhonchi, good excursion bilaterally Heart regular rate and rhythm, no murmur appreciated Abd soft, nontender, positive bowel sounds MSK no focal spinal tenderness, no joint edema Neuro: non-focal, well-oriented, appropriate affect   Lab Results  Component Value Date   WBC 7.6 04/29/2024   HGB 11.7 (L) 04/29/2024   HCT 36.0 04/29/2024   MCV 85.7 04/29/2024   PLT 319 04/29/2024   Lab Results  Component Value Date   FERRITIN 55 10/03/2019   IRON  68 10/03/2019   TIBC 389 10/03/2019   UIBC 321 10/03/2019   IRONPCTSAT 17 (L) 10/03/2019   Lab Results  Component Value Date   RBC 4.20 04/29/2024   Lab Results  Component Value Date   KPAFRELGTCHN 45.1 (H) 03/26/2024   LAMBDASER 36.3 (H) 03/26/2024   KAPLAMBRATIO 1.24 03/26/2024   Lab Results  Component Value Date   IGGSERUM 1,601 03/26/2024   IGA 67 03/26/2024   IGMSERUM 51 03/26/2024   Lab Results  Component Value Date   TOTALPROTELP 7.7 03/26/2024    ALBUMINELP 3.9 03/26/2024   A1GS 0.2 03/26/2024   A2GS 0.9 03/26/2024   BETS 1.3 03/26/2024   BETA2SER 0.3 02/20/2015   GAMS 1.5 03/26/2024   MSPIKE 1.0 (H) 03/26/2024   SPEI Comment 03/26/2024     Chemistry      Component Value Date/Time   NA 138 04/29/2024 1455   NA 139 03/23/2020 1220   NA 140 03/27/2017 0804   K 3.5 04/29/2024 1455   K 3.7 03/27/2017 0804   CL 99 04/29/2024 1455   CL 94 (L) 02/20/2015 1010   CO2 27 04/29/2024 1455   CO2 28 03/27/2017 0804   BUN 10 04/29/2024 1455   BUN 18 03/23/2020 1220  BUN 14.9 03/27/2017 0804   CREATININE 0.71 04/29/2024 1455   CREATININE 0.9 03/27/2017 0804      Component Value Date/Time   CALCIUM  9.3 04/29/2024 1455   CALCIUM  10.0 03/27/2017 0804   ALKPHOS 55 04/29/2024 1455   ALKPHOS 69 03/27/2017 0804   AST 21 04/29/2024 1455   AST 26 03/27/2017 0804   ALT 12 04/29/2024 1455   ALT 28 03/27/2017 0804   BILITOT 0.4 04/29/2024 1455   BILITOT 0.76 03/27/2017 0804     Encounter Diagnoses  Name Primary?   Smoldering multiple myeloma Yes   Pernicious anemia    Malignant neoplasm of upper-outer quadrant of breast in female, estrogen receptor positive, unspecified laterality (HCC)    Prophylactic use of letrozole  (Femara )    Normocytic anemia     Impression and Plan: Ms. Brotzman is 73 yo postmenopausal white female with history of stage Ia ductal carcinoma of the left breast with low Oncotype score. She had lumpectomy. She completed 2 cycle of Velcade  which was not effective. She then completed radiation to the left breast.   She is now on Femara  continues to tolerate it nicely.  She will continue her same regimen.  B 12 injection administered at home Last mammogram was on 10/19/2023 BI-RADS 2 Last DEXA was on 01/03/2023- Tscore of -0.5  Protein studies are pending to monitor smoldering myeloma.   RTC 2 months me, labs (CBC w/, CMP, LDH, MM panel, light chains, B12, iron , ferritin, retic)  Lauraine CHRISTELLA Dais,  PA-C 8/18/20254:19 PM

## 2024-05-01 LAB — PTH, INTACT AND CALCIUM
Calcium, Total (PTH): 9.4 mg/dL (ref 8.7–10.3)
PTH: 13 pg/mL — ABNORMAL LOW (ref 15–65)

## 2024-05-01 LAB — KAPPA/LAMBDA LIGHT CHAINS
Kappa free light chain: 33.5 mg/L — ABNORMAL HIGH (ref 3.3–19.4)
Kappa, lambda light chain ratio: 1.09 (ref 0.26–1.65)
Lambda free light chains: 30.7 mg/L — ABNORMAL HIGH (ref 5.7–26.3)

## 2024-05-02 LAB — MULTIPLE MYELOMA PANEL, SERUM
Albumin SerPl Elph-Mcnc: 3.5 g/dL (ref 2.9–4.4)
Albumin/Glob SerPl: 1.1 (ref 0.7–1.7)
Alpha 1: 0.2 g/dL (ref 0.0–0.4)
Alpha2 Glob SerPl Elph-Mcnc: 0.8 g/dL (ref 0.4–1.0)
B-Globulin SerPl Elph-Mcnc: 1.1 g/dL (ref 0.7–1.3)
Gamma Glob SerPl Elph-Mcnc: 1.2 g/dL (ref 0.4–1.8)
Globulin, Total: 3.4 g/dL (ref 2.2–3.9)
IgA: 63 mg/dL — ABNORMAL LOW (ref 64–422)
IgG (Immunoglobin G), Serum: 1586 mg/dL (ref 586–1602)
IgM (Immunoglobulin M), Srm: 52 mg/dL (ref 26–217)
M Protein SerPl Elph-Mcnc: 0.8 g/dL — ABNORMAL HIGH
Total Protein ELP: 6.9 g/dL (ref 6.0–8.5)

## 2024-05-07 ENCOUNTER — Other Ambulatory Visit (HOSPITAL_COMMUNITY): Payer: Self-pay

## 2024-05-07 ENCOUNTER — Encounter (INDEPENDENT_AMBULATORY_CARE_PROVIDER_SITE_OTHER): Payer: Self-pay

## 2024-05-07 MED ORDER — HYDROCODONE-ACETAMINOPHEN 10-325 MG PO TABS
ORAL_TABLET | ORAL | 0 refills | Status: AC
  Filled 2024-05-07: qty 60, 15d supply, fill #0

## 2024-05-20 ENCOUNTER — Encounter: Payer: Self-pay | Admitting: Hematology & Oncology

## 2024-05-21 ENCOUNTER — Other Ambulatory Visit: Payer: Self-pay | Admitting: Hematology & Oncology

## 2024-05-21 ENCOUNTER — Other Ambulatory Visit: Payer: Self-pay

## 2024-05-21 DIAGNOSIS — R519 Headache, unspecified: Secondary | ICD-10-CM

## 2024-05-21 DIAGNOSIS — H539 Unspecified visual disturbance: Secondary | ICD-10-CM

## 2024-05-21 DIAGNOSIS — D472 Monoclonal gammopathy: Secondary | ICD-10-CM

## 2024-06-26 ENCOUNTER — Inpatient Hospital Stay: Admitting: Medical Oncology

## 2024-06-26 ENCOUNTER — Inpatient Hospital Stay

## 2024-07-01 ENCOUNTER — Inpatient Hospital Stay: Attending: Hematology & Oncology

## 2024-07-01 ENCOUNTER — Inpatient Hospital Stay: Admitting: Medical Oncology

## 2024-07-01 ENCOUNTER — Other Ambulatory Visit: Payer: Self-pay

## 2024-07-01 ENCOUNTER — Encounter: Payer: Self-pay | Admitting: Medical Oncology

## 2024-07-01 VITALS — BP 136/79 | HR 88 | Temp 98.0°F | Resp 18 | Wt 192.1 lb

## 2024-07-01 DIAGNOSIS — Z17411 Hormone receptor positive with human epidermal growth factor receptor 2 negative status: Secondary | ICD-10-CM | POA: Diagnosis not present

## 2024-07-01 DIAGNOSIS — D510 Vitamin B12 deficiency anemia due to intrinsic factor deficiency: Secondary | ICD-10-CM | POA: Insufficient documentation

## 2024-07-01 DIAGNOSIS — Z79811 Long term (current) use of aromatase inhibitors: Secondary | ICD-10-CM

## 2024-07-01 DIAGNOSIS — Z79899 Other long term (current) drug therapy: Secondary | ICD-10-CM | POA: Diagnosis not present

## 2024-07-01 DIAGNOSIS — D472 Monoclonal gammopathy: Secondary | ICD-10-CM | POA: Diagnosis not present

## 2024-07-01 DIAGNOSIS — C50412 Malignant neoplasm of upper-outer quadrant of left female breast: Secondary | ICD-10-CM | POA: Diagnosis present

## 2024-07-01 DIAGNOSIS — Z923 Personal history of irradiation: Secondary | ICD-10-CM | POA: Insufficient documentation

## 2024-07-01 DIAGNOSIS — Z780 Asymptomatic menopausal state: Secondary | ICD-10-CM | POA: Diagnosis not present

## 2024-07-01 DIAGNOSIS — Z170 Estrogen receptor positive status [ER+]: Secondary | ICD-10-CM | POA: Insufficient documentation

## 2024-07-01 DIAGNOSIS — G629 Polyneuropathy, unspecified: Secondary | ICD-10-CM | POA: Insufficient documentation

## 2024-07-01 DIAGNOSIS — D649 Anemia, unspecified: Secondary | ICD-10-CM

## 2024-07-01 LAB — RETIC PANEL
Immature Retic Fract: 8 % (ref 2.3–15.9)
RBC.: 4.58 MIL/uL (ref 3.87–5.11)
Retic Count, Absolute: 58.2 K/uL (ref 19.0–186.0)
Retic Ct Pct: 1.3 % (ref 0.4–3.1)
Reticulocyte Hemoglobin: 29.3 pg (ref >27.9)

## 2024-07-01 LAB — CBC WITH DIFFERENTIAL (CANCER CENTER ONLY)
Abs Immature Granulocytes: 0.03 K/uL (ref 0.00–0.07)
Basophils Absolute: 0.1 K/uL (ref 0.0–0.1)
Basophils Relative: 1 %
Eosinophils Absolute: 0.3 K/uL (ref 0.0–0.5)
Eosinophils Relative: 3 %
HCT: 38.9 % (ref 36.0–46.0)
Hemoglobin: 12.4 g/dL (ref 12.0–15.0)
Immature Granulocytes: 0 %
Lymphocytes Relative: 31 %
Lymphs Abs: 3.1 K/uL (ref 0.7–4.0)
MCH: 27.1 pg (ref 26.0–34.0)
MCHC: 31.9 g/dL (ref 30.0–36.0)
MCV: 85.1 fL (ref 80.0–100.0)
Monocytes Absolute: 0.6 K/uL (ref 0.1–1.0)
Monocytes Relative: 6 %
Neutro Abs: 5.7 K/uL (ref 1.7–7.7)
Neutrophils Relative %: 59 %
Platelet Count: 404 K/uL — ABNORMAL HIGH (ref 150–400)
RBC: 4.57 MIL/uL (ref 3.87–5.11)
RDW: 13.3 % (ref 11.5–15.5)
WBC Count: 9.8 K/uL (ref 4.0–10.5)
nRBC: 0 % (ref 0.0–0.2)

## 2024-07-01 LAB — CMP (CANCER CENTER ONLY)
ALT: 12 U/L (ref 0–44)
AST: 21 U/L (ref 15–41)
Albumin: 4.5 g/dL (ref 3.5–5.0)
Alkaline Phosphatase: 58 U/L (ref 38–126)
Anion gap: 15 (ref 5–15)
BUN: 20 mg/dL (ref 8–23)
CO2: 25 mmol/L (ref 22–32)
Calcium: 10 mg/dL (ref 8.9–10.3)
Chloride: 99 mmol/L (ref 98–111)
Creatinine: 0.88 mg/dL (ref 0.44–1.00)
GFR, Estimated: >60 mL/min (ref >60)
Glucose, Bld: 201 mg/dL — ABNORMAL HIGH (ref 70–99)
Potassium: 3.5 mmol/L (ref 3.5–5.1)
Sodium: 139 mmol/L (ref 135–145)
Total Bilirubin: 0.6 mg/dL (ref 0.0–1.2)
Total Protein: 8.2 g/dL — ABNORMAL HIGH (ref 6.5–8.1)

## 2024-07-01 LAB — VITAMIN B12: Vitamin B-12: 1853 pg/mL — ABNORMAL HIGH (ref 180–914)

## 2024-07-01 LAB — IRON AND IRON BINDING CAPACITY (CC-WL,HP ONLY)
Iron: 64 ug/dL (ref 28–170)
Saturation Ratios: 12 % (ref 10.4–31.8)
TIBC: 515 ug/dL — ABNORMAL HIGH (ref 250–450)
UIBC: 451 ug/dL

## 2024-07-01 LAB — FERRITIN: Ferritin: 10 ng/mL — ABNORMAL LOW (ref 11–307)

## 2024-07-01 LAB — LACTATE DEHYDROGENASE: LDH: 186 U/L (ref 98–192)

## 2024-07-01 NOTE — Progress Notes (Signed)
 Hematology and Oncology Follow Up Visit  [name] [id number] [birth year] 73 y.o. 07/01/2024   Principle Diagnosis:  Stage IA (T1cN0M0) infiltrating ductal carcinoma of the LEFT breast -- ER+/PR+/HER2- -- Oncotype = 16 IgG Kappa smoldering myeloma Chronic neuropathy Pernicious Anemia   Past Therapy: Velcade  q week dosing (3/1) -- s/p cycle #2 -- d/c on 02/06/2018 for lack of effectiveness S/p XRT to the LEFT breast -- completed on 01/01/2021   Current Therapy:       Femara  2.5 mg po q day -- started on 01/19/2021 Vit B12 1000 mcg IM monthly - start on 03/25/2020- switched to Q2 weeks on 04/29/2024   Interim History:  [name] is here today for follow-up.   Since our last visit, she saw her endocrinologist for her elevated calcium  level. She had work up for hypoparathyroidism which was negative. It is believed that her vitamin D  supplementation was the cause. No dose adjustment recommended per Endocrinology. Vit D level was 56.4. PTH 17, calcium  9.5  She reports that she is otherwise well.  Home breast exams are normal She is UTD on mammograms  No adenopathy or lymphedema noted on exam.  No changes on self breast exam per patient.  No blood loss noted. No bruising or petechiae.  No fever, chills, n/v, cough, rash, SOB, chest pain, palpitations, abdominal pain or changes in bowel or bladder habits.  No falls or syncope reported.  No swelling or tingling in her extremities at this time.  Appetite is fair and hydration is good.   Wt Readings from Last 3 Encounters:  07/01/24 192 lb 1.9 oz (87.1 kg)  04/29/24 199 lb 12.8 oz (90.6 kg)  03/26/24 195 lb (88.5 kg)     ECOG Performance Status: 1 - Symptomatic but completely ambulatory  Medications:  Allergies as of 07/01/2024       Reactions   Dristan [pheniramine-phenylephrine ] Palpitations   Other Palpitations   Contact old cold medicine        Medication List        Accurate as of July 01, 2024  12:09 PM. If you have any questions, ask your nurse or doctor.          STOP taking these medications    meclizine  25 MG tablet Commonly known as: ANTIVERT  Stopped by: Lauraine CHRISTELLA Dais       TAKE these medications    albuterol  108 (90 Base) MCG/ACT inhaler Commonly known as: VENTOLIN  HFA Inhale into the lungs every 6 (six) hours as needed for wheezing or shortness of breath.   BD Disp Needles 27G X 1/2 Misc Generic drug: NEEDLE (DISP) 27 G 1 each by Does not apply route every 14 (fourteen) days.   cyanocobalamin  1000 MCG/ML injection Commonly known as: VITAMIN B12 Inject 1 mL (1,000 mcg total) into the skin every 14 (fourteen) days.   Dexcom G7 Receiver Devi   HYDROcodone -acetaminophen  10-325 MG tablet Commonly known as: NORCO Take 1 tablet by mouth four times a day as needed for pain   letrozole  2.5 MG tablet Commonly known as: FEMARA  TAKE 1 TABLET BY MOUTH EVERY DAY   MAGNESIUM  GLYCINATE PO Take by mouth at bedtime.   metFORMIN 500 MG 24 hr tablet Commonly known as: GLUCOPHAGE-XR Take 1,000 mg by mouth 2 (two) times daily.   metoprolol  succinate 100 MG 24 hr tablet Commonly known as: TOPROL -XL Take 100 mg by mouth daily.   morphine  60 MG 12 hr tablet Commonly known as: MS CONTIN  Take 1 tablet (  60 mg total) by mouth every 12 (twelve) hours.   naloxone  4 MG/0.1ML Liqd nasal spray kit Commonly known as: NARCAN  Administer 1 spray into nostril in the event of respiratory distress or overdose. May repeat in 2-3 minutes if needed.   pravastatin 40 MG tablet Commonly known as: PRAVACHOL Take 40 mg by mouth daily.   venlafaxine  XR 150 MG 24 hr capsule Commonly known as: EFFEXOR -XR Take 150 mg by mouth daily with breakfast.   Vitamin D  (Ergocalciferol ) 1.25 MG (50000 UNIT) Caps capsule Commonly known as: DRISDOL  TAKE 1 CAPSULE BY MOUTH ONE TIME PER WEEK        Allergies:  Allergies  Allergen Reactions   Dristan [Pheniramine-Phenylephrine ]  Palpitations   Other Palpitations    Contact old cold medicine    Past Medical History, Surgical history, Social history, and Family History were reviewed and updated.  Review of Systems: All other 10 point review of systems is negative.   Physical Exam:  weight is 192 lb 1.9 oz (87.1 kg). Her oral temperature is 98 F (36.7 C). Her blood pressure is 136/79 and her pulse is 88. Her respiration is 18 and oxygen saturation is 95%.   Wt Readings from Last 3 Encounters:  07/01/24 192 lb 1.9 oz (87.1 kg)  04/29/24 199 lb 12.8 oz (90.6 kg)  03/26/24 195 lb (88.5 kg)    Ocular: Sclerae unicteric, pupils equal, round and reactive to light Ear-nose-throat: Oropharynx clear, dentition fair Lymphatic: No cervical, supraclavicular or axillary adenopathy Lungs no rales or rhonchi, good excursion bilaterally Heart regular rate and rhythm, no murmur appreciated Abd soft, nontender, positive bowel sounds MSK no focal spinal tenderness, no joint edema Neuro: non-focal, well-oriented, appropriate affect   Lab Results  Component Value Date   WBC 9.8 07/01/2024   HGB 12.4 07/01/2024   HCT 38.9 07/01/2024   MCV 85.1 07/01/2024   PLT 404 (H) 07/01/2024   Lab Results  Component Value Date   FERRITIN 55 10/03/2019   IRON  68 10/03/2019   TIBC 389 10/03/2019   UIBC 321 10/03/2019   IRONPCTSAT 17 (L) 10/03/2019   Lab Results  Component Value Date   RETICCTPCT 1.3 07/01/2024   RBC 4.58 07/01/2024   RBC 4.57 07/01/2024   Lab Results  Component Value Date   KPAFRELGTCHN 33.5 (H) 04/29/2024   LAMBDASER 30.7 (H) 04/29/2024   KAPLAMBRATIO 1.09 04/29/2024   Lab Results  Component Value Date   IGGSERUM 1,586 04/29/2024   IGA 63 (L) 04/29/2024   IGMSERUM 52 04/29/2024   Lab Results  Component Value Date   TOTALPROTELP 6.9 04/29/2024   ALBUMINELP 3.9 03/26/2024   A1GS 0.2 03/26/2024   A2GS 0.9 03/26/2024   BETS 1.3 03/26/2024   BETA2SER 0.3 02/20/2015   GAMS 1.5 03/26/2024    MSPIKE 1.0 (H) 03/26/2024   SPEI Comment 03/26/2024     Chemistry      Component Value Date/Time   NA 139 07/01/2024 1108   NA 139 03/23/2020 1220   NA 140 03/27/2017 0804   K 3.5 07/01/2024 1108   K 3.7 03/27/2017 0804   CL 99 07/01/2024 1108   CL 94 (L) 02/20/2015 1010   CO2 25 07/01/2024 1108   CO2 28 03/27/2017 0804   BUN 20 07/01/2024 1108   BUN 18 03/23/2020 1220   BUN 14.9 03/27/2017 0804   CREATININE 0.88 07/01/2024 1108   CREATININE 0.9 03/27/2017 0804      Component Value Date/Time   CALCIUM  10.0 07/01/2024 1108  CALCIUM  9.4 04/29/2024 1455   CALCIUM  10.0 03/27/2017 0804   ALKPHOS 58 07/01/2024 1108   ALKPHOS 69 03/27/2017 0804   AST 21 07/01/2024 1108   AST 26 03/27/2017 0804   ALT 12 07/01/2024 1108   ALT 28 03/27/2017 0804   BILITOT 0.6 07/01/2024 1108   BILITOT 0.76 03/27/2017 0804     Encounter Diagnoses  Name Primary?   Smoldering multiple myeloma Yes   Pernicious anemia    Prophylactic use of letrozole  (Femara )     Impression and Plan: Ms. Vonruden is 73 yo postmenopausal white female with history of stage Ia ductal carcinoma of the left breast with low Oncotype score. She had lumpectomy. She completed 2 cycle of Velcade  which was not effective. She then completed radiation to the left breast.   She is now on Femara  continues to tolerate it nicely.  She will continue her same regimen B 12 injection administered at home Last mammogram was on 10/19/2023 BI-RADS 2 Last DEXA was on 01/03/2023- Tscore of -0.5 She did have a bit of weight loss. She denies any other symptoms. We will monitor. If weight loss is stable at her next visit she may want to bump follow up out to every 3-4 months.   Protein studies are pending to monitor smoldering myeloma.   RTC 2 months me, labs (CBC w/, CMP, LDH, MM panel, light chains, B12, iron , ferritin, retic)  Lauraine CHRISTELLA Dais, PA-C 10/20/202512:09 PM

## 2024-07-02 ENCOUNTER — Other Ambulatory Visit: Payer: Self-pay | Admitting: Medical Oncology

## 2024-07-02 ENCOUNTER — Ambulatory Visit: Payer: Self-pay | Admitting: Medical Oncology

## 2024-07-02 LAB — KAPPA/LAMBDA LIGHT CHAINS
Kappa free light chain: 37.2 mg/L — ABNORMAL HIGH (ref 3.3–19.4)
Kappa, lambda light chain ratio: 1.09 (ref 0.26–1.65)
Lambda free light chains: 34.1 mg/L — ABNORMAL HIGH (ref 5.7–26.3)

## 2024-07-04 LAB — MULTIPLE MYELOMA PANEL, SERUM
Albumin SerPl Elph-Mcnc: 3.5 g/dL (ref 2.9–4.4)
Albumin/Glob SerPl: 0.9 (ref 0.7–1.7)
Alpha 1: 0.2 g/dL (ref 0.0–0.4)
Alpha2 Glob SerPl Elph-Mcnc: 1 g/dL (ref 0.4–1.0)
B-Globulin SerPl Elph-Mcnc: 1.3 g/dL (ref 0.7–1.3)
Gamma Glob SerPl Elph-Mcnc: 1.5 g/dL (ref 0.4–1.8)
Globulin, Total: 4 g/dL — ABNORMAL HIGH (ref 2.2–3.9)
IgA: 68 mg/dL (ref 64–422)
IgG (Immunoglobin G), Serum: 1624 mg/dL — ABNORMAL HIGH (ref 586–1602)
IgM (Immunoglobulin M), Srm: 53 mg/dL (ref 26–217)
M Protein SerPl Elph-Mcnc: 1 g/dL — ABNORMAL HIGH
Total Protein ELP: 7.5 g/dL (ref 6.0–8.5)

## 2024-07-09 ENCOUNTER — Other Ambulatory Visit: Payer: Self-pay | Admitting: Medical Oncology

## 2024-07-09 MED ORDER — BD DISP NEEDLES 27G X 1/2" MISC: 1.0000 | 0 refills | Status: AC

## 2024-07-09 MED ORDER — CYANOCOBALAMIN 1000 MCG/ML IJ SOLN: 1000.0000 ug | INTRAMUSCULAR | 3 refills | Status: AC

## 2024-07-18 ENCOUNTER — Encounter: Payer: Self-pay | Admitting: Family

## 2024-07-30 ENCOUNTER — Ambulatory Visit: Admitting: Podiatry

## 2024-09-02 ENCOUNTER — Other Ambulatory Visit: Payer: Self-pay

## 2024-09-02 ENCOUNTER — Ambulatory Visit: Attending: General Surgery

## 2024-09-02 ENCOUNTER — Inpatient Hospital Stay: Attending: Hematology & Oncology

## 2024-09-02 ENCOUNTER — Encounter: Payer: Self-pay | Admitting: Medical Oncology

## 2024-09-02 ENCOUNTER — Inpatient Hospital Stay: Admitting: Medical Oncology

## 2024-09-02 VITALS — Wt 199.4 lb

## 2024-09-02 VITALS — BP 144/66 | HR 88 | Temp 98.0°F | Resp 19 | Ht 65.75 in | Wt 198.0 lb

## 2024-09-02 DIAGNOSIS — Z170 Estrogen receptor positive status [ER+]: Secondary | ICD-10-CM | POA: Insufficient documentation

## 2024-09-02 DIAGNOSIS — C50412 Malignant neoplasm of upper-outer quadrant of left female breast: Secondary | ICD-10-CM | POA: Insufficient documentation

## 2024-09-02 DIAGNOSIS — G629 Polyneuropathy, unspecified: Secondary | ICD-10-CM | POA: Diagnosis not present

## 2024-09-02 DIAGNOSIS — D472 Monoclonal gammopathy: Secondary | ICD-10-CM | POA: Diagnosis not present

## 2024-09-02 DIAGNOSIS — D510 Vitamin B12 deficiency anemia due to intrinsic factor deficiency: Secondary | ICD-10-CM | POA: Diagnosis not present

## 2024-09-02 DIAGNOSIS — D649 Anemia, unspecified: Secondary | ICD-10-CM

## 2024-09-02 DIAGNOSIS — Z79899 Other long term (current) drug therapy: Secondary | ICD-10-CM | POA: Insufficient documentation

## 2024-09-02 DIAGNOSIS — Z780 Asymptomatic menopausal state: Secondary | ICD-10-CM | POA: Diagnosis not present

## 2024-09-02 DIAGNOSIS — Z483 Aftercare following surgery for neoplasm: Secondary | ICD-10-CM | POA: Insufficient documentation

## 2024-09-02 DIAGNOSIS — Z923 Personal history of irradiation: Secondary | ICD-10-CM | POA: Diagnosis not present

## 2024-09-02 DIAGNOSIS — Z17411 Hormone receptor positive with human epidermal growth factor receptor 2 negative status: Secondary | ICD-10-CM | POA: Diagnosis not present

## 2024-09-02 DIAGNOSIS — Z79811 Long term (current) use of aromatase inhibitors: Secondary | ICD-10-CM | POA: Insufficient documentation

## 2024-09-02 LAB — IRON AND IRON BINDING CAPACITY (CC-WL,HP ONLY)
Iron: 69 ug/dL (ref 28–170)
Saturation Ratios: 15 % (ref 10.4–31.8)
TIBC: 459 ug/dL — ABNORMAL HIGH (ref 250–450)
UIBC: 390 ug/dL

## 2024-09-02 LAB — CBC WITH DIFFERENTIAL (CANCER CENTER ONLY)
Abs Immature Granulocytes: 0.14 K/uL — ABNORMAL HIGH (ref 0.00–0.07)
Basophils Absolute: 0.1 K/uL (ref 0.0–0.1)
Basophils Relative: 1 %
Eosinophils Absolute: 0.5 K/uL (ref 0.0–0.5)
Eosinophils Relative: 6 %
HCT: 40.3 % (ref 36.0–46.0)
Hemoglobin: 12.9 g/dL (ref 12.0–15.0)
Immature Granulocytes: 2 %
Lymphocytes Relative: 29 %
Lymphs Abs: 2.7 K/uL (ref 0.7–4.0)
MCH: 27.8 pg (ref 26.0–34.0)
MCHC: 32 g/dL (ref 30.0–36.0)
MCV: 86.9 fL (ref 80.0–100.0)
Monocytes Absolute: 0.7 K/uL (ref 0.1–1.0)
Monocytes Relative: 8 %
Neutro Abs: 5 K/uL (ref 1.7–7.7)
Neutrophils Relative %: 54 %
Platelet Count: 406 K/uL — ABNORMAL HIGH (ref 150–400)
RBC: 4.64 MIL/uL (ref 3.87–5.11)
RDW: 14.1 % (ref 11.5–15.5)
WBC Count: 9.1 K/uL (ref 4.0–10.5)
nRBC: 0 % (ref 0.0–0.2)

## 2024-09-02 LAB — CMP (CANCER CENTER ONLY)
ALT: 15 U/L (ref 0–44)
AST: 22 U/L (ref 15–41)
Albumin: 4.6 g/dL (ref 3.5–5.0)
Alkaline Phosphatase: 63 U/L (ref 38–126)
Anion gap: 14 (ref 5–15)
BUN: 14 mg/dL (ref 8–23)
CO2: 28 mmol/L (ref 22–32)
Calcium: 9.9 mg/dL (ref 8.9–10.3)
Chloride: 100 mmol/L (ref 98–111)
Creatinine: 0.71 mg/dL (ref 0.44–1.00)
GFR, Estimated: >60 mL/min
Glucose, Bld: 146 mg/dL — ABNORMAL HIGH (ref 70–99)
Potassium: 3.7 mmol/L (ref 3.5–5.1)
Sodium: 143 mmol/L (ref 135–145)
Total Bilirubin: 0.4 mg/dL (ref 0.0–1.2)
Total Protein: 8.2 g/dL — ABNORMAL HIGH (ref 6.5–8.1)

## 2024-09-02 LAB — RETIC PANEL
Immature Retic Fract: 8.9 % (ref 2.3–15.9)
RBC.: 4.88 MIL/uL (ref 3.87–5.11)
Retic Count, Absolute: 90.3 K/uL (ref 19.0–186.0)
Retic Ct Pct: 1.9 % (ref 0.4–3.1)
Reticulocyte Hemoglobin: 30.4 pg

## 2024-09-02 LAB — LACTATE DEHYDROGENASE: LDH: 544 U/L — ABNORMAL HIGH (ref 105–235)

## 2024-09-02 LAB — VITAMIN B12: Vitamin B-12: 1012 pg/mL — ABNORMAL HIGH (ref 180–914)

## 2024-09-02 LAB — FERRITIN: Ferritin: 47 ng/mL (ref 11–307)

## 2024-09-02 NOTE — Therapy (Signed)
 " OUTPATIENT PHYSICAL THERAPY SOZO SCREENING NOTE   Patient Name: [name] MRN: [id number] DOB:[birth year], 73 y.o., female Today's Date: 09/02/2024  PCP: Timmy Maude SAUNDERS, MD REFERRING PROVIDER: Curvin Deward MOULD, MD   PT End of Session - 09/02/24 1700     Visit Number 9   # unchanged due to screen only   PT Start Time 1658    PT Stop Time 1703    PT Time Calculation (min) 5 min    Activity Tolerance Patient tolerated treatment well    Behavior During Therapy [facility] for tasks assessed/performed          Past Medical History:  Diagnosis Date   Allergies    Anxiety    Asthma    treated by pulmonologist    Atherosclerosis of aorta    noted on xray   Breast cancer (HCC)    Right   Chronic pain    Depression    Diabetes (HCC)    type II   Edema    Goals of care, counseling/discussion 09/02/2020   High cholesterol    History of nuclear stress test 11/12/2009   normal    Hypertension    IgG monoclonal gammopathy of uncertain significance    igG kappa monoclonal gammopathy of unkown significance (per notes from [employer]).   Insomnia    Low back pain    Migraine headache    Neuropathy associated with MGUS    Peripheral neuropathy    Pernicious anemia 05/20/2021   PONV (postoperative nausea and vomiting)    hard to wake up   Sleep apnea    Smoldering multiple myeloma 12/04/2017   Vertigo    Past Surgical History:  Procedure Laterality Date   BACK SURGERY  2019   T10-S1 Fusion Dr. Gust SANES SURGERY  2019   2 fusion in lower back (L4-S1) and neck   breast cyst removal age 74     BREAST LUMPECTOMY WITH RADIOACTIVE SEED AND SENTINEL LYMPH NODE BIOPSY Left 09/30/2020   Procedure: RADIOACTIVE SEED GUIDED LEFT BREAST LUMPECTOMY, LEFT AXILLARY SENTINEL LYMPH NODE BIOPSY;  Surgeon: Curvin Deward MOULD, MD;  Location: [facility];  Service: General;  Laterality: Left;   CESAREAN SECTION     x2   hernia repair at 54 months old     miscarriage D&C      NASAL SINUS SURGERY     x2   NECK SURGERY     pain stimulator in back     RE-EXCISION OF BREAST CANCER,SUPERIOR MARGINS Left 10/19/2020   Procedure: RE-EXCISION LEFT BREAST MARGINS;  Surgeon: Curvin Deward MOULD, MD;  Location: [facility];  Service: General;  Laterality: Left;   TONSILLECTOMY     Patient Active Problem List   Diagnosis Date Noted   Pernicious anemia 05/20/2021   Goals of care, counseling/discussion 09/02/2020   Malignant neoplasm of upper-outer quadrant of left breast in female, estrogen receptor positive (HCC) 08/27/2020   Iron  deficiency anemia 10/09/2019   Impaired mobility and activities of daily living 08/30/2018   Multiple myeloma (HCC) 08/30/2018   S/P spinal fusion 08/30/2018   Lumbar degenerative disc disease 08/27/2018   History of degenerative disc disease 08/15/2018   Neck pain 08/15/2018   Sensorineural hearing loss (SNHL), bilateral 08/15/2018   Tinnitus of both ears 08/15/2018   Smoldering multiple myeloma 12/04/2017   Breakdown (mechanical) of implanted electronic neurostimulator, generator, initial encounter 08/12/2016   Myalgia 08/12/2016   Post laminectomy syndrome 08/12/2016  Migraine with aura and without status migrainosus, not intractable 10/15/2015   Intrinsic asthma 01/11/2010   Dyspnea 12/14/2009   DIABETES MELLITUS, TYPE II 12/11/2009   Hyperlipidemia 12/11/2009   MIGRAINE HEADACHE 12/11/2009   Hereditary and idiopathic peripheral neuropathy 12/11/2009   Hypertension 12/11/2009   Insomnia 12/11/2009   EDEMA 12/11/2009   Type 2 diabetes mellitus with diabetic polyneuropathy (HCC) 12/11/2009    REFERRING DIAG: left breast cancer at risk for lymphedema  THERAPY DIAG: Aftercare following surgery for neoplasm  PERTINENT HISTORY: Recent diagnosis of Lt breast cancer IDC stage IA with surgery 09/30/20 with 3 lymph nodes removed and then re-excision 10/19/20.  pt is followed by Dr. Timmy due to smoldering igG Kappa myeloma.  Other  history includes: DM, Peripheral neuropathy, and HTN, cervical fusion with limited movement and back T10-S1, vertigo, recent fall with hand fracture   PRECAUTIONS: left UE Lymphedema risk, None  SUBJECTIVE: Pt returns for her 6 month L-Dex screen.   PAIN:  Are you having pain? No  SOZO SCREENING: Patient was assessed today using the SOZO machine to determine the lymphedema index score. This was compared to her baseline score. It was determined that she is within the recommended range when compared to her baseline and no further action is needed at this time. She will continue SOZO screenings. These are done every 3 months for 2 years post operatively followed by every 6 months for 2 years, and then annually. Assisted pts with doffing and donning socks and shoes.    L-DEX FLOWSHEETS - 09/02/24 1700       L-DEX LYMPHEDEMA SCREENING   Measurement Type Unilateral    L-DEX MEASUREMENT EXTREMITY Upper Extremity    POSITION  Standing    DOMINANT SIDE Right    At Risk Side Left    BASELINE SCORE (UNILATERAL) -2.6    L-DEX SCORE (UNILATERAL) -0.2    VALUE CHANGE (UNILAT) 2.4         P: One more 6 month L-Dex screen then can transition to annual.   Aden Berwyn Caldron, PTA 09/02/2024, 5:02 PM    "

## 2024-09-02 NOTE — Progress Notes (Signed)
 " Hematology and Oncology Follow Up Visit  [name] [id number] 1950/11/13 73 y.o. 09/02/2024   Principle Diagnosis:  Stage IA (T1cN0M0) infiltrating ductal carcinoma of the LEFT breast -- ER+/PR+/HER2- -- Oncotype = 16 IgG Kappa smoldering myeloma Chronic neuropathy Pernicious Anemia   Past Therapy: Velcade  q week dosing (3/1) -- s/p cycle #2 -- d/c on 02/06/2018 for lack of effectiveness S/p XRT to the LEFT breast -- completed on 01/01/2021   Current Therapy:       Femara  2.5 mg po q day -- started on 01/19/2021 Vit B12 1000 mcg IM monthly - start on 03/25/2020- switched to Q2 weeks on 04/29/2024   Interim History:  [name] is here today for follow-up.   She reports that she is well.  Home breast exams are normal She is UTD on mammograms She is followed by endocrinology for her elevated calcium  level. No adenopathy or lymphedema noted on exam.  No changes on self breast exam per patient.  No blood loss noted. No bruising or petechiae.  No fever, chills, n/v, cough, rash, SOB, chest pain, palpitations, abdominal pain or changes in bowel or bladder habits.  No falls or syncope reported.  No swelling or tingling in her extremities at this time.  Appetite is fair and hydration is good.   Wt Readings from Last 3 Encounters:  09/02/24 198 lb (89.8 kg)  07/01/24 192 lb 1.9 oz (87.1 kg)  04/29/24 199 lb 12.8 oz (90.6 kg)     ECOG Performance Status: 1 - Symptomatic but completely ambulatory  Medications:  Allergies as of 09/02/2024       Reactions   Dristan [pheniramine-phenylephrine ] Palpitations   Other Palpitations   Contact old cold medicine        Medication List        Accurate as of September 02, 2024 11:37 AM. If you have any questions, ask your nurse or doctor.          albuterol  108 (90 Base) MCG/ACT inhaler Commonly known as: VENTOLIN  HFA Inhale into the lungs every 6 (six) hours as needed for wheezing or shortness of breath.   BD  Disp Needles 27G X 1/2 Misc Generic drug: NEEDLE (DISP) 27 G 1 each by Does not apply route every 14 (fourteen) days.   cyanocobalamin  1000 MCG/ML injection Commonly known as: VITAMIN B12 Inject 1 mL (1,000 mcg total) into the skin every 14 (fourteen) days.   Dexcom G7 Receiver Devi   HYDROcodone -acetaminophen  10-325 MG tablet Commonly known as: NORCO Take 1 tablet by mouth four times a day as needed for pain   letrozole  2.5 MG tablet Commonly known as: FEMARA  TAKE 1 TABLET BY MOUTH EVERY DAY   MAGNESIUM  GLYCINATE PO Take by mouth at bedtime.   metFORMIN 500 MG 24 hr tablet Commonly known as: GLUCOPHAGE-XR Take 1,000 mg by mouth 2 (two) times daily.   metoprolol  succinate 100 MG 24 hr tablet Commonly known as: TOPROL -XL Take 100 mg by mouth daily.   morphine  60 MG 12 hr tablet Commonly known as: MS CONTIN  Take 1 tablet (60 mg total) by mouth every 12 (twelve) hours.   naloxone  4 MG/0.1ML Liqd nasal spray kit Commonly known as: NARCAN  Administer 1 spray into nostril in the event of respiratory distress or overdose. May repeat in 2-3 minutes if needed.   pravastatin 40 MG tablet Commonly known as: PRAVACHOL Take 40 mg by mouth daily.   venlafaxine  XR 150 MG 24 hr capsule Commonly known as: EFFEXOR -XR Take  150 mg by mouth daily with breakfast.   Vitamin D  (Ergocalciferol ) 1.25 MG (50000 UNIT) Caps capsule Commonly known as: DRISDOL  TAKE 1 CAPSULE BY MOUTH ONE TIME PER WEEK        Allergies:  Allergies  Allergen Reactions   Dristan [Pheniramine-Phenylephrine ] Palpitations   Other Palpitations    Contact old cold medicine    Past Medical History, Surgical history, Social history, and Family History were reviewed and updated.  Review of Systems: All other 10 point review of systems is negative.   Physical Exam:  height is 5' 5.75 (1.67 m) and weight is 198 lb (89.8 kg). Her oral temperature is 98 F (36.7 C). Her blood pressure is 144/66 (abnormal) and  her pulse is 88. Her respiration is 19 and oxygen saturation is 96%.   Wt Readings from Last 3 Encounters:  09/02/24 198 lb (89.8 kg)  07/01/24 192 lb 1.9 oz (87.1 kg)  04/29/24 199 lb 12.8 oz (90.6 kg)    Ocular: Sclerae unicteric, pupils equal, round and reactive to light Ear-nose-throat: Oropharynx clear, dentition fair Lymphatic: No cervical, supraclavicular or axillary adenopathy Lungs no rales or rhonchi, good excursion bilaterally Heart regular rate and rhythm, no murmur appreciated Abd soft, nontender, positive bowel sounds MSK no focal spinal tenderness, no joint edema Neuro: non-focal, well-oriented, appropriate affect   Lab Results  Component Value Date   WBC 9.1 09/02/2024   HGB 12.9 09/02/2024   HCT 40.3 09/02/2024   MCV 86.9 09/02/2024   PLT 406 (H) 09/02/2024   Lab Results  Component Value Date   FERRITIN 10 (L) 07/01/2024   IRON  64 07/01/2024   TIBC 515 (H) 07/01/2024   UIBC 451 07/01/2024   IRONPCTSAT 12 07/01/2024   Lab Results  Component Value Date   RETICCTPCT 1.9 09/02/2024   RBC 4.64 09/02/2024   Lab Results  Component Value Date   KPAFRELGTCHN 37.2 (H) 07/01/2024   LAMBDASER 34.1 (H) 07/01/2024   KAPLAMBRATIO 1.09 07/01/2024   Lab Results  Component Value Date   IGGSERUM 1,624 (H) 07/01/2024   IGA 68 07/01/2024   IGMSERUM 53 07/01/2024   Lab Results  Component Value Date   TOTALPROTELP 7.5 07/01/2024   ALBUMINELP 3.9 03/26/2024   A1GS 0.2 03/26/2024   A2GS 0.9 03/26/2024   BETS 1.3 03/26/2024   BETA2SER 0.3 02/20/2015   GAMS 1.5 03/26/2024   MSPIKE 1.0 (H) 03/26/2024   SPEI Comment 03/26/2024     Chemistry      Component Value Date/Time   NA 139 07/01/2024 1108   NA 139 03/23/2020 1220   NA 140 03/27/2017 0804   K 3.5 07/01/2024 1108   K 3.7 03/27/2017 0804   CL 99 07/01/2024 1108   CL 94 (L) 02/20/2015 1010   CO2 25 07/01/2024 1108   CO2 28 03/27/2017 0804   BUN 20 07/01/2024 1108   BUN 18 03/23/2020 1220   BUN 14.9  03/27/2017 0804   CREATININE 0.88 07/01/2024 1108   CREATININE 0.9 03/27/2017 0804      Component Value Date/Time   CALCIUM  10.0 07/01/2024 1108   CALCIUM  9.4 04/29/2024 1455   CALCIUM  10.0 03/27/2017 0804   ALKPHOS 58 07/01/2024 1108   ALKPHOS 69 03/27/2017 0804   AST 21 07/01/2024 1108   AST 26 03/27/2017 0804   ALT 12 07/01/2024 1108   ALT 28 03/27/2017 0804   BILITOT 0.6 07/01/2024 1108   BILITOT 0.76 03/27/2017 0804     Encounter Diagnoses  Name Primary?  Smoldering multiple myeloma Yes   Pernicious anemia    Prophylactic use of letrozole  (Femara )    Normocytic anemia    Impression and Plan: Ms. Abbruzzese is 73 yo postmenopausal white female with history of stage Ia ductal carcinoma of the left breast with low Oncotype score. She had lumpectomy. She completed 2 cycle of Velcade  which was not effective. She then completed radiation to the left breast.   She is now on Femara  continues to tolerate it nicely.  She will continue her same regimen B 12 injection administered at home Last mammogram was on 10/19/2023 BI-RADS 2 Last DEXA was on 01/03/2023- Tscore of -0.5 Weight loss has reversed which is reassuring.   Protein studies are pending to monitor smoldering myeloma.   RTC 2 months me, labs (CBC w/, CMP, LDH, MM panel, light chains, B12, iron , ferritin, retic)  Lauraine CHRISTELLA Dais, PA-C 12/22/202511:37 AM  "

## 2024-09-02 NOTE — Patient Instructions (Signed)
 Dr. Maree- [facility]  [facility] providers

## 2024-09-03 LAB — KAPPA/LAMBDA LIGHT CHAINS
Kappa free light chain: 37.8 mg/L — ABNORMAL HIGH (ref 3.3–19.4)
Kappa, lambda light chain ratio: 1.13 (ref 0.26–1.65)
Lambda free light chains: 33.4 mg/L — ABNORMAL HIGH (ref 5.7–26.3)

## 2024-09-04 ENCOUNTER — Ambulatory Visit: Payer: Self-pay | Admitting: Medical Oncology

## 2024-09-09 LAB — MULTIPLE MYELOMA PANEL, SERUM
Albumin SerPl Elph-Mcnc: 3.4 g/dL (ref 2.9–4.4)
Albumin/Glob SerPl: 0.9 (ref 0.7–1.7)
Alpha 1: 0.3 g/dL (ref 0.0–0.4)
Alpha2 Glob SerPl Elph-Mcnc: 1.1 g/dL — ABNORMAL HIGH (ref 0.4–1.0)
B-Globulin SerPl Elph-Mcnc: 1.3 g/dL (ref 0.7–1.3)
Gamma Glob SerPl Elph-Mcnc: 1.5 g/dL (ref 0.4–1.8)
Globulin, Total: 4.1 g/dL — ABNORMAL HIGH (ref 2.2–3.9)
IgA: 67 mg/dL (ref 64–422)
IgG (Immunoglobin G), Serum: 1584 mg/dL (ref 586–1602)
IgM (Immunoglobulin M), Srm: 38 mg/dL (ref 26–217)
M Protein SerPl Elph-Mcnc: 0.9 g/dL — ABNORMAL HIGH
Total Protein ELP: 7.5 g/dL (ref 6.0–8.5)

## 2024-09-16 ENCOUNTER — Other Ambulatory Visit (HOSPITAL_COMMUNITY): Payer: Self-pay

## 2024-09-16 MED ORDER — HYDROCODONE-ACETAMINOPHEN 10-325 MG PO TABS
1.0000 | ORAL_TABLET | Freq: Four times a day (QID) | ORAL | 0 refills | Status: AC | PRN
  Filled 2024-09-16: qty 60, 15d supply, fill #0

## 2024-09-17 ENCOUNTER — Other Ambulatory Visit: Payer: Self-pay | Admitting: Family

## 2024-10-04 ENCOUNTER — Encounter: Payer: Self-pay | Admitting: Family

## 2024-11-04 ENCOUNTER — Inpatient Hospital Stay: Admitting: Medical Oncology

## 2024-11-04 ENCOUNTER — Inpatient Hospital Stay: Attending: Hematology & Oncology

## 2024-12-12 ENCOUNTER — Ambulatory Visit: Admitting: Neurology

## 2025-03-03 ENCOUNTER — Ambulatory Visit
